# Patient Record
Sex: Female | Born: 1950 | Race: White | Hispanic: No | Smoking: Never smoker
Health system: Southern US, Community
[De-identification: ages and names within clinical notes are randomized; demographics above are authoritative.]

## PROBLEM LIST (undated history)

## (undated) DIAGNOSIS — F32A Depression, unspecified: Secondary | ICD-10-CM

## (undated) DIAGNOSIS — R011 Cardiac murmur, unspecified: Secondary | ICD-10-CM

## (undated) DIAGNOSIS — G47419 Narcolepsy without cataplexy: Secondary | ICD-10-CM

## (undated) DIAGNOSIS — F419 Anxiety disorder, unspecified: Secondary | ICD-10-CM

## (undated) DIAGNOSIS — E785 Hyperlipidemia, unspecified: Secondary | ICD-10-CM

## (undated) DIAGNOSIS — K219 Gastro-esophageal reflux disease without esophagitis: Secondary | ICD-10-CM

## (undated) DIAGNOSIS — R519 Headache, unspecified: Secondary | ICD-10-CM

## (undated) DIAGNOSIS — I10 Essential (primary) hypertension: Secondary | ICD-10-CM

## (undated) DIAGNOSIS — R112 Nausea with vomiting, unspecified: Secondary | ICD-10-CM

## (undated) DIAGNOSIS — F329 Major depressive disorder, single episode, unspecified: Secondary | ICD-10-CM

## (undated) DIAGNOSIS — G4711 Idiopathic hypersomnia with long sleep time: Secondary | ICD-10-CM

## (undated) DIAGNOSIS — R51 Headache: Secondary | ICD-10-CM

## (undated) DIAGNOSIS — J301 Allergic rhinitis due to pollen: Secondary | ICD-10-CM

## (undated) DIAGNOSIS — F319 Bipolar disorder, unspecified: Secondary | ICD-10-CM

## (undated) DIAGNOSIS — M797 Fibromyalgia: Secondary | ICD-10-CM

## (undated) DIAGNOSIS — R32 Unspecified urinary incontinence: Secondary | ICD-10-CM

## (undated) DIAGNOSIS — I2510 Atherosclerotic heart disease of native coronary artery without angina pectoris: Secondary | ICD-10-CM

## (undated) DIAGNOSIS — Z9889 Other specified postprocedural states: Secondary | ICD-10-CM

## (undated) DIAGNOSIS — M1990 Unspecified osteoarthritis, unspecified site: Secondary | ICD-10-CM

## (undated) DIAGNOSIS — I341 Nonrheumatic mitral (valve) prolapse: Secondary | ICD-10-CM

## (undated) HISTORY — PX: OTHER SURGICAL HISTORY: SHX169

## (undated) HISTORY — PX: REPLACEMENT TOTAL HIP W/  RESURFACING IMPLANTS: SUR1222

## (undated) HISTORY — DX: Allergic rhinitis due to pollen: J30.1

## (undated) HISTORY — DX: Atherosclerotic heart disease of native coronary artery without angina pectoris: I25.10

## (undated) HISTORY — DX: Essential (primary) hypertension: I10

## (undated) HISTORY — DX: Unspecified urinary incontinence: R32

## (undated) HISTORY — DX: Idiopathic hypersomnia with long sleep time: G47.11

## (undated) HISTORY — DX: Bipolar disorder, unspecified: F31.9

## (undated) HISTORY — DX: Hyperlipidemia, unspecified: E78.5

## (undated) HISTORY — PX: VESICOVAGINAL FISTULA CLOSURE W/ TAH: SUR271

---

## 1979-01-11 HISTORY — PX: OTHER SURGICAL HISTORY: SHX169

## 1989-01-10 HISTORY — PX: TOTAL ABDOMINAL HYSTERECTOMY: SHX209

## 1996-01-11 HISTORY — PX: ROTATOR CUFF REPAIR: SHX139

## 1997-09-08 ENCOUNTER — Emergency Department (HOSPITAL_COMMUNITY): Admission: EM | Admit: 1997-09-08 | Discharge: 1997-09-08 | Payer: Self-pay | Admitting: Emergency Medicine

## 1998-01-04 ENCOUNTER — Emergency Department (HOSPITAL_COMMUNITY): Admission: EM | Admit: 1998-01-04 | Discharge: 1998-01-04 | Payer: Self-pay | Admitting: Emergency Medicine

## 2001-01-16 ENCOUNTER — Encounter: Payer: Self-pay | Admitting: Orthopedic Surgery

## 2001-01-16 ENCOUNTER — Encounter: Admission: RE | Admit: 2001-01-16 | Discharge: 2001-01-16 | Payer: Self-pay | Admitting: Orthopedic Surgery

## 2001-04-12 ENCOUNTER — Encounter: Payer: Self-pay | Admitting: Orthopedic Surgery

## 2001-04-19 ENCOUNTER — Inpatient Hospital Stay (HOSPITAL_COMMUNITY): Admission: RE | Admit: 2001-04-19 | Discharge: 2001-04-22 | Payer: Self-pay | Admitting: Orthopedic Surgery

## 2001-07-09 ENCOUNTER — Encounter: Admission: RE | Admit: 2001-07-09 | Discharge: 2001-07-27 | Payer: Self-pay | Admitting: Orthopedic Surgery

## 2002-10-29 ENCOUNTER — Ambulatory Visit (HOSPITAL_BASED_OUTPATIENT_CLINIC_OR_DEPARTMENT_OTHER): Admission: RE | Admit: 2002-10-29 | Discharge: 2002-10-29 | Payer: Self-pay | Admitting: Family Medicine

## 2004-01-11 HISTORY — PX: SHOULDER ARTHROSCOPY: SHX128

## 2004-09-08 ENCOUNTER — Encounter: Admission: RE | Admit: 2004-09-08 | Discharge: 2004-09-08 | Payer: Self-pay | Admitting: Orthopedic Surgery

## 2004-12-06 ENCOUNTER — Other Ambulatory Visit: Admission: RE | Admit: 2004-12-06 | Discharge: 2004-12-06 | Payer: Self-pay | Admitting: Obstetrics & Gynecology

## 2005-01-10 DIAGNOSIS — I2510 Atherosclerotic heart disease of native coronary artery without angina pectoris: Secondary | ICD-10-CM

## 2005-01-10 HISTORY — DX: Atherosclerotic heart disease of native coronary artery without angina pectoris: I25.10

## 2005-01-29 ENCOUNTER — Encounter: Admission: RE | Admit: 2005-01-29 | Discharge: 2005-01-29 | Payer: Self-pay | Admitting: Cardiology

## 2005-03-17 ENCOUNTER — Ambulatory Visit (HOSPITAL_COMMUNITY): Admission: RE | Admit: 2005-03-17 | Discharge: 2005-03-18 | Payer: Self-pay | Admitting: Cardiology

## 2005-03-21 ENCOUNTER — Inpatient Hospital Stay (HOSPITAL_COMMUNITY)
Admission: RE | Admit: 2005-03-21 | Discharge: 2005-03-26 | Payer: Self-pay | Admitting: Thoracic Surgery (Cardiothoracic Vascular Surgery)

## 2005-03-21 HISTORY — PX: CORONARY ARTERY BYPASS GRAFT: SHX141

## 2005-03-23 ENCOUNTER — Encounter (INDEPENDENT_AMBULATORY_CARE_PROVIDER_SITE_OTHER): Payer: Self-pay | Admitting: Cardiology

## 2005-04-01 ENCOUNTER — Encounter: Admission: RE | Admit: 2005-04-01 | Discharge: 2005-04-01 | Payer: Self-pay | Admitting: Cardiology

## 2005-04-01 ENCOUNTER — Inpatient Hospital Stay (HOSPITAL_COMMUNITY): Admission: AD | Admit: 2005-04-01 | Discharge: 2005-04-03 | Payer: Self-pay | Admitting: Cardiology

## 2005-04-18 ENCOUNTER — Encounter: Admission: RE | Admit: 2005-04-18 | Discharge: 2005-04-18 | Payer: Self-pay | Admitting: *Deleted

## 2005-05-05 ENCOUNTER — Encounter (HOSPITAL_COMMUNITY): Admission: RE | Admit: 2005-05-05 | Discharge: 2005-06-10 | Payer: Self-pay | Admitting: Cardiology

## 2006-07-21 ENCOUNTER — Ambulatory Visit: Payer: Self-pay | Admitting: Pulmonary Disease

## 2006-08-11 ENCOUNTER — Ambulatory Visit: Payer: Self-pay | Admitting: Pulmonary Disease

## 2006-09-05 ENCOUNTER — Ambulatory Visit: Payer: Self-pay | Admitting: Pulmonary Disease

## 2006-11-02 ENCOUNTER — Ambulatory Visit: Payer: Self-pay | Admitting: Pulmonary Disease

## 2006-12-28 ENCOUNTER — Ambulatory Visit: Payer: Self-pay | Admitting: Pulmonary Disease

## 2006-12-28 DIAGNOSIS — F319 Bipolar disorder, unspecified: Secondary | ICD-10-CM

## 2006-12-28 DIAGNOSIS — E785 Hyperlipidemia, unspecified: Secondary | ICD-10-CM | POA: Insufficient documentation

## 2006-12-28 DIAGNOSIS — E7800 Pure hypercholesterolemia, unspecified: Secondary | ICD-10-CM

## 2006-12-28 DIAGNOSIS — I2510 Atherosclerotic heart disease of native coronary artery without angina pectoris: Secondary | ICD-10-CM | POA: Insufficient documentation

## 2007-03-02 ENCOUNTER — Telehealth (INDEPENDENT_AMBULATORY_CARE_PROVIDER_SITE_OTHER): Payer: Self-pay | Admitting: *Deleted

## 2007-03-07 ENCOUNTER — Ambulatory Visit: Payer: Self-pay | Admitting: Physical Medicine & Rehabilitation

## 2007-03-07 ENCOUNTER — Encounter
Admission: RE | Admit: 2007-03-07 | Discharge: 2007-03-08 | Payer: Self-pay | Admitting: Physical Medicine & Rehabilitation

## 2007-04-11 ENCOUNTER — Ambulatory Visit: Payer: Self-pay | Admitting: Pulmonary Disease

## 2007-04-30 ENCOUNTER — Ambulatory Visit (HOSPITAL_BASED_OUTPATIENT_CLINIC_OR_DEPARTMENT_OTHER): Admission: RE | Admit: 2007-04-30 | Discharge: 2007-04-30 | Payer: Self-pay | Admitting: Pulmonary Disease

## 2007-04-30 ENCOUNTER — Encounter: Payer: Self-pay | Admitting: Pulmonary Disease

## 2007-05-01 ENCOUNTER — Encounter: Payer: Self-pay | Admitting: Pulmonary Disease

## 2007-05-21 ENCOUNTER — Telehealth (INDEPENDENT_AMBULATORY_CARE_PROVIDER_SITE_OTHER): Payer: Self-pay | Admitting: *Deleted

## 2007-06-20 ENCOUNTER — Ambulatory Visit: Payer: Self-pay | Admitting: Pulmonary Disease

## 2007-07-25 IMAGING — CR DG CHEST 2V
2 series · 2 of 2 positions shown · non-contrast
Comparison: none

CLINICAL DATA: Precardiac cath.  
 CHEST ? 2 VIEW:

[view not recorded (1 of 2)]
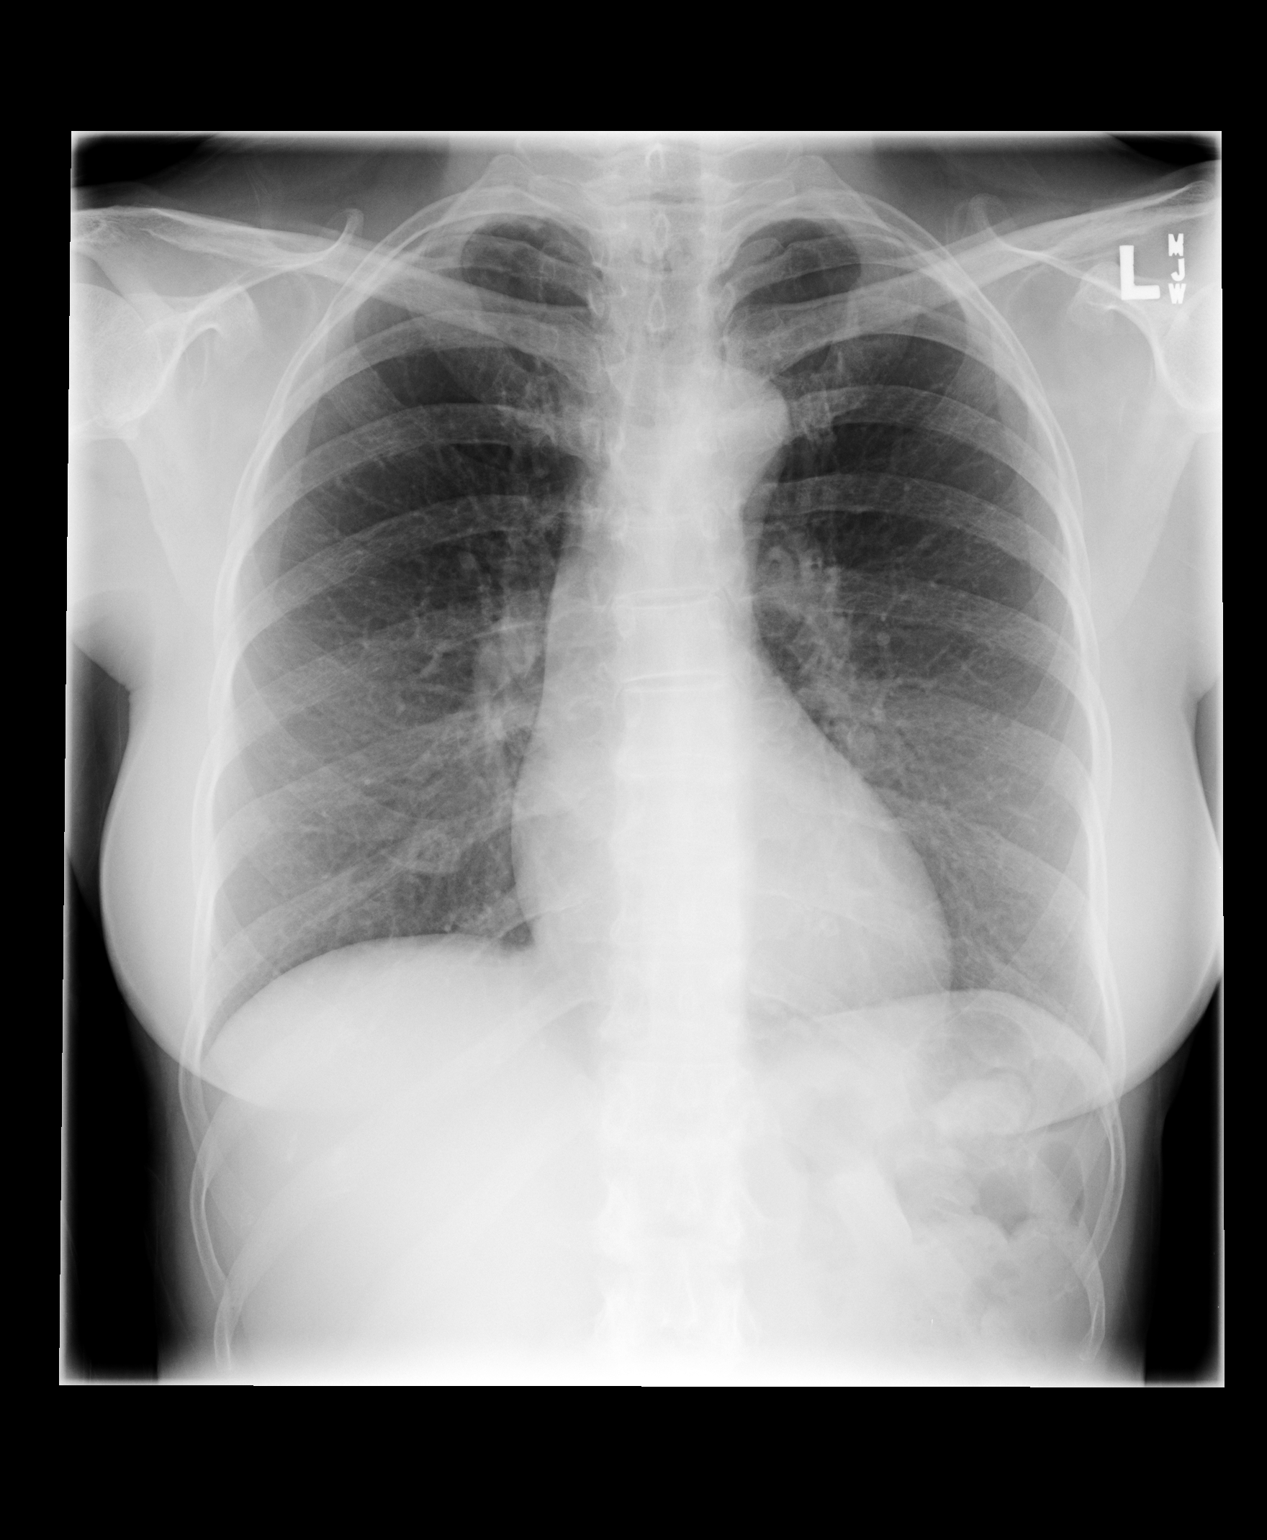

[view not recorded (2 of 2)]
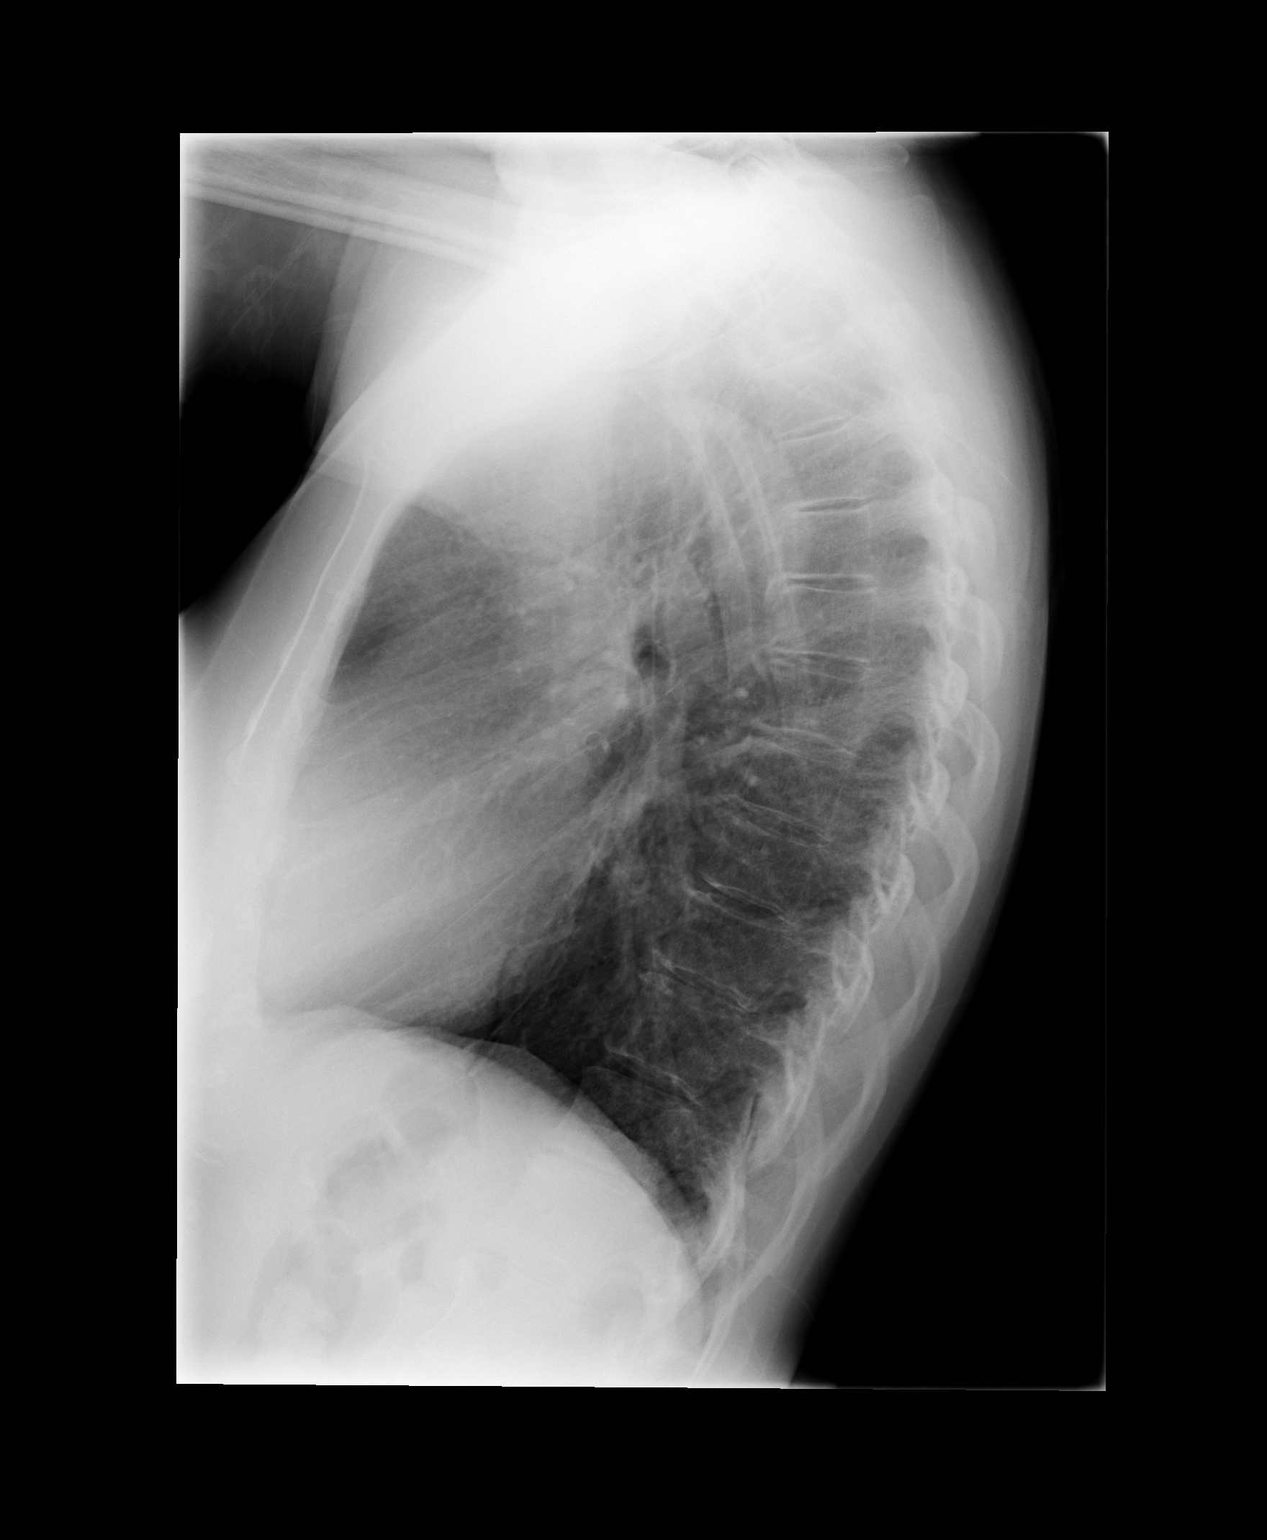

[2 of 2 positions shown; findings below may reference images not displayed]

FINDINGS: Two views of the chest show the lungs to be clear.  The heart is within upper limits of normal.  Mild peribronchial thickening is seen.  No bony abnormality is seen.
IMPRESSION: No active lung disease.  Mild peribronchial thickening.

## 2007-07-26 ENCOUNTER — Telehealth: Payer: Self-pay | Admitting: Pulmonary Disease

## 2007-10-02 ENCOUNTER — Telehealth (INDEPENDENT_AMBULATORY_CARE_PROVIDER_SITE_OTHER): Payer: Self-pay | Admitting: *Deleted

## 2007-10-31 ENCOUNTER — Telehealth (INDEPENDENT_AMBULATORY_CARE_PROVIDER_SITE_OTHER): Payer: Self-pay | Admitting: *Deleted

## 2007-12-19 ENCOUNTER — Telehealth: Payer: Self-pay | Admitting: Pulmonary Disease

## 2008-01-09 ENCOUNTER — Ambulatory Visit: Payer: Self-pay | Admitting: Pulmonary Disease

## 2008-01-21 DIAGNOSIS — G4710 Hypersomnia, unspecified: Secondary | ICD-10-CM

## 2008-03-14 ENCOUNTER — Telehealth: Payer: Self-pay | Admitting: Pulmonary Disease

## 2008-04-07 ENCOUNTER — Telehealth: Payer: Self-pay | Admitting: Internal Medicine

## 2008-05-06 ENCOUNTER — Ambulatory Visit: Payer: Self-pay | Admitting: Pulmonary Disease

## 2008-05-26 ENCOUNTER — Ambulatory Visit: Payer: Self-pay | Admitting: Pulmonary Disease

## 2008-06-26 ENCOUNTER — Telehealth (INDEPENDENT_AMBULATORY_CARE_PROVIDER_SITE_OTHER): Payer: Self-pay | Admitting: *Deleted

## 2008-07-10 ENCOUNTER — Telehealth: Payer: Self-pay | Admitting: Pulmonary Disease

## 2008-07-22 ENCOUNTER — Telehealth: Payer: Self-pay | Admitting: Pulmonary Disease

## 2008-08-14 ENCOUNTER — Telehealth (INDEPENDENT_AMBULATORY_CARE_PROVIDER_SITE_OTHER): Payer: Self-pay | Admitting: *Deleted

## 2008-09-19 ENCOUNTER — Telehealth (INDEPENDENT_AMBULATORY_CARE_PROVIDER_SITE_OTHER): Payer: Self-pay | Admitting: *Deleted

## 2008-10-28 ENCOUNTER — Telehealth: Payer: Self-pay | Admitting: Pulmonary Disease

## 2008-11-28 ENCOUNTER — Ambulatory Visit: Payer: Self-pay | Admitting: Pulmonary Disease

## 2008-12-31 ENCOUNTER — Telehealth: Payer: Self-pay | Admitting: Pulmonary Disease

## 2009-02-02 ENCOUNTER — Telehealth: Payer: Self-pay | Admitting: Pulmonary Disease

## 2009-02-11 ENCOUNTER — Telehealth (INDEPENDENT_AMBULATORY_CARE_PROVIDER_SITE_OTHER): Payer: Self-pay | Admitting: *Deleted

## 2009-02-23 ENCOUNTER — Encounter: Payer: Self-pay | Admitting: Pulmonary Disease

## 2009-03-02 ENCOUNTER — Telehealth: Payer: Self-pay | Admitting: Pulmonary Disease

## 2009-03-09 ENCOUNTER — Telehealth: Payer: Self-pay | Admitting: Internal Medicine

## 2009-04-21 ENCOUNTER — Telehealth (INDEPENDENT_AMBULATORY_CARE_PROVIDER_SITE_OTHER): Payer: Self-pay | Admitting: *Deleted

## 2009-04-28 ENCOUNTER — Encounter: Payer: Self-pay | Admitting: Pulmonary Disease

## 2009-05-25 ENCOUNTER — Telehealth (INDEPENDENT_AMBULATORY_CARE_PROVIDER_SITE_OTHER): Payer: Self-pay | Admitting: *Deleted

## 2009-07-02 ENCOUNTER — Telehealth (INDEPENDENT_AMBULATORY_CARE_PROVIDER_SITE_OTHER): Payer: Self-pay | Admitting: *Deleted

## 2009-07-08 ENCOUNTER — Telehealth (INDEPENDENT_AMBULATORY_CARE_PROVIDER_SITE_OTHER): Payer: Self-pay | Admitting: *Deleted

## 2009-07-09 ENCOUNTER — Encounter: Payer: Self-pay | Admitting: Pulmonary Disease

## 2009-08-21 ENCOUNTER — Telehealth (INDEPENDENT_AMBULATORY_CARE_PROVIDER_SITE_OTHER): Payer: Self-pay | Admitting: *Deleted

## 2009-10-01 ENCOUNTER — Ambulatory Visit: Payer: Self-pay | Admitting: Pulmonary Disease

## 2009-12-22 ENCOUNTER — Telehealth: Payer: Self-pay | Admitting: Pulmonary Disease

## 2009-12-28 ENCOUNTER — Telehealth (INDEPENDENT_AMBULATORY_CARE_PROVIDER_SITE_OTHER): Payer: Self-pay | Admitting: *Deleted

## 2009-12-28 ENCOUNTER — Ambulatory Visit: Payer: Self-pay | Admitting: Pulmonary Disease

## 2010-01-28 ENCOUNTER — Telehealth (INDEPENDENT_AMBULATORY_CARE_PROVIDER_SITE_OTHER): Payer: Self-pay | Admitting: *Deleted

## 2010-01-31 ENCOUNTER — Encounter: Payer: Self-pay | Admitting: Thoracic Surgery (Cardiothoracic Vascular Surgery)

## 2010-02-11 NOTE — Progress Notes (Signed)
Summary: Adderal Rx  Phone Note Call from Patient Call back at [phone]   Caller: Patient Call For: Jaidin Richison Summary of Call: need amphepamine salts  ER AND AMPHEPAMINE SALTS TAB PT WILL PICK UP Initial call taken by: Rickard Patience,  February 02, 2009 10:41 AM  Follow-up for Phone Call        called, spoke with pt.  Pt informed VS is out of office until tomorrow but will have him sign rxs tomorrow and will call her once they are signed.  She is ok with this.  rxs printed and given to Lawson Fiscal to have VS sign tomorrow.  Will forward message to VS as FYI. Follow-up by: Gweneth Dimitri RN,  February 02, 2009 10:47 AM  Additional Follow-up for Phone Call Additional follow up Details #1::        RX is in your look at folder awaiting your signature.Michel Bickers [facility]  February 02, 2009 1:22 PM    Additional Follow-up for Phone Call Additional follow up Details #2::    Signed script. Follow-up by: [name] MD,  February 03, 2009 1:37 PM  Prescriptions: ADDERALL 20 MG TABS (AMPHETAMINE-DEXTROAMPHETAMINE) one by mouth two times a day as needed  #60 x 0   Entered by:   Gweneth Dimitri RN   Authorized by:   [name] MD   Signed by:   Gweneth Dimitri RN on 02/02/2009   Method used:   Print then Give to Patient   RxID:   [id number] ADDERALL XR 20 MG XR24H-CAP (AMPHETAMINE-DEXTROAMPHETAMINE) one by mouth once daily  #30 x 0   Entered by:   Gweneth Dimitri RN   Authorized by:   [name] MD   Signed by:   Gweneth Dimitri RN on 02/02/2009   Method used:   Print then Give to Patient   RxID:   [id number]   Appended Document: Adderal Rx LMOM letting patient know her prescriptions were ready for pick up.

## 2010-02-11 NOTE — Progress Notes (Signed)
Summary: adderall PA-approved  Phone Note Outgoing Call   Call placed by: Vernie Murders,  July 08, 2009 4:37 PM Call placed to: Insurer Summary of Call: Called [employer] at [phone] to initiate adderall PA.  I answered the clinical questions over the phone and now will await a determination letter to be faxed to the triage faxline.  Initial call taken by: Vernie Murders,  July 08, 2009 4:43 PM  Follow-up for Phone Call        Recieved fax back from [employer] stating that med was approved starting 07/08/09-07/09/10. Sent this info to [employer] and will have approval letter scanned in on 07/09/09.   Follow-up by: Vernie Murders,  July 08, 2009 5:20 PM

## 2010-02-11 NOTE — Assessment & Plan Note (Signed)
Summary: ROV/LC   Visit Type:  Follow-up Copy to:  Dr. Jennelle Human, Dr. [name]  CC:  Hypersomnia...patient says she is doing well...no complaints today.  History of Present Illness: I saw Brandy [name] in follow up for her idiopathic hypersomnia.  She has been doing okay.  She goes to bed at 12am.  She falls asleep quickly, and is sleeping through the night.  She wakes up at 6am.  She feels okay in the morning after she takes her adderall.  She will take another dose at 1pm.  This helps her get through the day without too much trouble.  Her mood has been doing okay.  She had a bad spell of depression recently when she ran into an ex-boyfriend.  She didn't go to work for almost a week, but was eventually able to pull herself out of this.  She is not having any problems with headaches, tremors, chest pain, palpitations, shortness of breath, or abdominal symptoms.   Current Medications (verified): 1)  Diovan 160 Mg  Tabs (Valsartan) .... 1/2 By Mouth Daily 2)  Lamictal Xr 25 Mg Xr24h-Tab (Lamotrigine) .... 62.5mg  Daily 3)  Crestor 20 Mg  Tabs (Rosuvastatin Calcium) .... One Tablet Every Day 4)  Celebrex 50 Mg Caps (Celecoxib) .... Once Daily 5)  Adderall Xr 20 Mg Xr24h-Cap (Amphetamine-Dextroamphetamine) .Marland Kitchen.. 1-3 Tabs Daily As Needed 6)  Adderall 20 Mg Tabs (Amphetamine-Dextroamphetamine) .... One By Mouth Two Times A Day As Needed 7)  Nac 600 600 Mg Caps (Acetylcysteine) .... 2400mg  Daily 8)  Aleve 220 Mg Tabs (Naproxen Sodium) .Marland Kitchen.. 1-2 By Mouth Daily  Allergies (verified): 1)  ! Pcn 2)  ! Morphine 3)  ! Codeine 4)  ! Demerol 5)  ! Talwin 6)  ! * Bextra  Past History:  Past Medical History: Hyperlipidemia Idiopathic hypersomnia      - PSG 04/30/07 AHI 0.5, PLMI 5.2      - MSLT 04/30/07 mean sleep latency 4 min, 5/5 naps, 0/5 SOREM CAD, Dr. [name] HTN Bipolar, Dr. [name]  Past Surgical History: Reviewed history from 01/09/2008 and no changes  required. Tracheotomy 1956 Myomectomy 1981 Hysterectomy 1991 Open reduction nasal fracture 2003 Right rotator cuff repair 2003 Left shoulder arthroscopy 2003 CABG x one 2007  Vital Signs:  Patient profile:   60 year old female Height:      60 inches (152.40 cm) Weight:      108.50 pounds (49.32 kg) BMI:     21.27 O2 Sat:      99 % on Room air Temp:     98.0 degrees F (36.67 degrees C) oral Pulse rate:   89 / minute BP sitting:   118 / 76  (left arm) Cuff size:   regular  Vitals Entered By: Michel Bickers CMA (October 01, 2009 4:41 PM)  O2 Sat at Rest %:  99 O2 Flow:  Room air CC: Hypersomnia...patient says she is doing well...no complaints today Comments Medications reviewed with patient Daytime phone verified. Michel Bickers [facility]  October 01, 2009 4:42 PM   Physical Exam  General:  normal appearance, healthy appearing, and thin.   Nose:  no deformity, discharge, inflammation, or lesions Mouth:  no deformity or lesions Neck:  no masses, thyromegaly, or abnormal cervical nodes Lungs:  clear bilaterally to auscultation and percussion Heart:  regular rate and rhythm, S1, S2 without murmurs, rubs, gallops, or clicks Extremities:  no clubbing, cyanosis, edema, or deformity noted Neurologic:  normal CN II-XII and strength normal.  Cervical Nodes:  no significant adenopathy Psych:  anxious.     Impression & Recommendations:  Problem # 1:  HYPERSOMNIA, IDIOPATHIC (ICD-780.54) Will continue her current regimen of adderall.    Problem # 2:  BIPOLAR AFFECTIVE DISORDER (ICD-296.80) Her mood is stable on her current regimen.  Problem # 3:  CAD (ICD-414.00) She does not appear to have any adverse cardiovascular effects from adderall.  She is being followed by cardiology.  Medications Added to Medication List This Visit: 1)  Nac 600 600 Mg Caps (Acetylcysteine) .... 2400mg  daily 2)  Aleve 220 Mg Tabs (Naproxen sodium) .Marland Kitchen.. 1-2 by mouth daily  Complete Medication List: 1)   Diovan 160 Mg Tabs (Valsartan) .... 1/2 by mouth daily 2)  Lamictal Xr 25 Mg Xr24h-tab (Lamotrigine) .... 62.5mg  daily 3)  Crestor 20 Mg Tabs (Rosuvastatin calcium) .... One tablet every day 4)  Celebrex 50 Mg Caps (Celecoxib) .... Once daily 5)  Adderall Xr 20 Mg Xr24h-cap (Amphetamine-dextroamphetamine) .Marland Kitchen.. 1-3 tabs daily as needed 6)  Adderall 20 Mg Tabs (Amphetamine-dextroamphetamine) .... One by mouth two times a day as needed 7)  Nac 600 600 Mg Caps (Acetylcysteine) .... 2400mg  daily 8)  Aleve 220 Mg Tabs (Naproxen sodium) .Marland Kitchen.. 1-2 by mouth daily  Other Orders: Est. Patient Level III (16109)  Patient Instructions: 1)  Follow up in 6 months Prescriptions: ADDERALL 20 MG TABS (AMPHETAMINE-DEXTROAMPHETAMINE) one by mouth two times a day as needed  #60 x 0   Entered and Authorized by:   [name] MD   Signed by:   [name] MD on 10/01/2009   Method used:   Print then Give to Patient   RxID:   [id number] ADDERALL XR 20 MG XR24H-CAP (AMPHETAMINE-DEXTROAMPHETAMINE) 1-3 tabs daily as needed  #90 x 0   Entered and Authorized by:   [name] MD   Signed by:   [name] MD on 10/01/2009   Method used:   Print then Give to Patient   RxID:   [id number]

## 2010-02-11 NOTE — Progress Notes (Signed)
Summary: prescript  Phone Note Call from Patient Call back at [phone]   Caller: Patient Call For: sood Summary of Call: need prescript for dexteamtaphetamine cr. pt will pick up Initial call taken by: Rickard Patience,  July 02, 2009 9:46 AM  Follow-up for Phone Call        pls advise if ok to give  Philipp Deputy [facility]  July 02, 2009 11:17 AM   RX printed and signed by [name] and given to patient. Patient was here to pick up.Michel Bickers [facility]  July 03, 2009 5:33 PM    Prescriptions: ADDERALL 20 MG TABS (AMPHETAMINE-DEXTROAMPHETAMINE) one by mouth two times a day as needed  #29 x 0   Entered by:   Michel Bickers CMA   Authorized by:   [name] MD   Signed by:   Michel Bickers CMA on 07/03/2009   Method used:   Print then Give to Patient   RxID:   [id number] ADDERALL XR 20 MG XR24H-CAP (AMPHETAMINE-DEXTROAMPHETAMINE) 1-3 tabs daily as needed  #90 x 0   Entered by:   Michel Bickers CMA   Authorized by:   [name] MD   Signed by:   Michel Bickers CMA on 07/03/2009   Method used:   Print then Give to Patient   RxID:   [id number]

## 2010-02-11 NOTE — Progress Notes (Signed)
Summary: rx LMTCB x1   Phone Note Call from Patient Call back at Work Phone [phone]   Caller: Patient Call For: sood Reason for Call: Talk to Nurse Summary of Call: need rx for adderall xr - call pt she'll pick up. Initial call taken by: Eugene Gavia,  May 25, 2009 11:59 AM  Follow-up for Phone Call        rx printed and palced on CY look-at. Carron Curie CMA  May 25, 2009 12:20 PM   Additional Follow-up for Phone Call Additional follow up Details #1::        Rx signed by CY and placed at front desk for pt to pick up.  LMOMTCB to inform pt of this.  Gweneth Dimitri RN  May 25, 2009 1:36 PM     Additional Follow-up for Phone Call Additional follow up Details #2::    Spoke with pt and notified that rx is ready for pick up. Follow-up by: Vernie Murders,  May 25, 2009 2:41 PM  Prescriptions: ADDERALL XR 20 MG XR24H-CAP (AMPHETAMINE-DEXTROAMPHETAMINE) 1-3 tabs daily as needed  #90 x 0   Entered by:   Carron Curie CMA   Authorized by:   Waymon Budge MD   Signed by:   Carron Curie CMA on 05/25/2009   Method used:   Print then Give to Patient   RxID:   [id number]

## 2010-02-11 NOTE — Assessment & Plan Note (Signed)
Summary: OV - Trouble with sleep meds/ddp   Visit Type:  Follow-up Copy to:  Dr. Jennelle Human, Dr. [name]  CC:  Hypersomnia dollow-up...pt says adderall is not working as well for her.  History of Present Illness: I saw [name] in follow up for her idiopathic hypersomnia.  She has notice more trouble staying awake.  This is starting to affect her work.  She is worried she may need to ask for a medical leave from her work.  She feels like she has no energy or motivation.  She is exhausted all the time.  It is getting harder to get out of bed in the morning.  She has been drinking more coffee during the day.  She is not sure if adderall is working as well as before.  She was told the immediate release adderall is not available currently due to manufacturer backlog.  She has been taking 60 mg of adderall Xr per day.  However, she will take one pill upon waking up.  She takes another pill around noon, and then a third in the evening.  She says that she feels most alert in the evening, but then this interfers with her ability to fall asleep.  She was seen by Dr. Jennelle Human, and plan was to transition from lamictal to seroquel.  She took seroquel for two days and then stopped.  She felt like she was comatose and non-functional while using seroquel.  She feels like her mood can swing up and down several times per day.  Current Medications (verified): 1)  Diovan 160 Mg  Tabs (Valsartan) .... 1/2 By Mouth Daily 2)  Lamictal 150 Mg Tabs (Lamotrigine) .Marland Kitchen.. 1 By Mouth Daily 3)  Crestor 20 Mg  Tabs (Rosuvastatin Calcium) .... One Tablet Every Day 4)  Adderall Xr 20 Mg Xr24h-Cap (Amphetamine-Dextroamphetamine) .Marland Kitchen.. 1-3 Tabs Daily As Needed 5)  Adderall 20 Mg Tabs (Amphetamine-Dextroamphetamine) .... One By Mouth Two Times A Day As Needed 6)  Nac 600 600 Mg Caps (Acetylcysteine) .... 2400mg  Daily 7)  Aleve 220 Mg Tabs (Naproxen Sodium) .Marland Kitchen.. 1-2 By Mouth Daily  Allergies (verified): 1)  ! Pcn 2)  !  Morphine 3)  ! Codeine 4)  ! Demerol 5)  ! Talwin 6)  ! * Bextra  Past History:  Past Medical History: Reviewed history from 10/01/2009 and no changes required. Hyperlipidemia Idiopathic hypersomnia      - PSG 04/30/07 AHI 0.5, PLMI 5.2      - MSLT 04/30/07 mean sleep latency 4 min, 5/5 naps, 0/5 SOREM CAD, Dr. [name] HTN Bipolar, Dr. [name]  Past Surgical History: Reviewed history from 01/09/2008 and no changes required. Tracheotomy 1956 Myomectomy 1981 Hysterectomy 1991 Open reduction nasal fracture 2003 Right rotator cuff repair 2003 Left shoulder arthroscopy 2003 CABG x one 2007  Vital Signs:  Patient profile:   60 year old female Height:      60 inches (152.40 cm) Weight:      108.50 pounds (49.32 kg) BMI:     21.27 O2 Sat:      98 % on Room air Temp:     98.0 degrees F (36.67 degrees C) oral Pulse rate:   96 / minute BP sitting:   142 / 88  (left arm) Cuff size:   regular  Vitals Entered By: Michel Bickers CMA (December 28, 2009 4:48 PM)  O2 Sat at Rest %:  98 O2 Flow:  Room air CC: Hypersomnia dollow-up...pt says adderall is not working as well  for her Comments Medications reviewed with patient Michel Bickers [facility]  December 28, 2009 4:49 PM    Impression & Recommendations:  Problem # 1:  HYPERSOMNIA, IDIOPATHIC (ICD-780.54)  Will have her take all of her adderall xr in the morning to see if this improves her daytime wakefulness while avoiding trouble with her falling asleep.  She is to call in one week to determine if this has helped.  Problem # 2:  BIPOLAR AFFECTIVE DISORDER (ICD-296.80)  She was not able to tolerate seroquel due to oversedation.  She is to follow up with psychiatry for further assessment.  Medications Added to Medication List This Visit: 1)  Lamictal 150 Mg Tabs (Lamotrigine) .Marland Kitchen.. 1 by mouth daily 2)  Adderall Xr 20 Mg Xr24h-cap (Amphetamine-dextroamphetamine) .... 3 pills once daily in morning  Complete Medication  List: 1)  Diovan 160 Mg Tabs (Valsartan) .... 1/2 by mouth daily 2)  Lamictal 150 Mg Tabs (Lamotrigine) .Marland Kitchen.. 1 by mouth daily 3)  Crestor 20 Mg Tabs (Rosuvastatin calcium) .... One tablet every day 4)  Adderall Xr 20 Mg Xr24h-cap (Amphetamine-dextroamphetamine) .... 3 pills once daily in morning 5)  Nac 600 600 Mg Caps (Acetylcysteine) .... 2400mg  daily 6)  Aleve 220 Mg Tabs (Naproxen sodium) .Marland Kitchen.. 1-2 by mouth daily  Other Orders: Est. Patient Level III (84696)  Patient Instructions: 1)  Take three pills of adderall Xr in morning 2)  Call in one week to discuss status 3)  Follow up in 2 to 3 months   Immunization History:  Influenza Immunization History:    Influenza:  historical (10/10/2009)

## 2010-02-11 NOTE — Progress Notes (Signed)
Summary: prescript  Phone Note Call from Patient Call back at [phone]   Caller: Patient Call For: sood Summary of Call: need dextroamphetamine prescript she will pick up on monday Initial call taken by: Rickard Patience,  August 21, 2009 3:11 PM  Follow-up for Phone Call        Brandy [name]   is it ok if i print these off for SOOD to sign today??  please advise. thanks Randell Loop CMA  August 21, 2009 3:14 PM   [name], i have sch the patient for follow-up on 09/07/2009 with you. Okay to print RX?Michel Bickers [facility]  August 21, 2009 4:20 PM  Dr. Delton Coombes, pt is in our office and [name] is not in. Please advise on giving RX on pt's adderall.Michel Bickers [facility]  August 24, 2009 5:28 PM  1 month supply filled Leslye Peer MD  August 24, 2009 5:33 PM  Follow-up by: Leslye Peer MD,  August 24, 2009 5:33 PM    Prescriptions: ADDERALL 20 MG TABS (AMPHETAMINE-DEXTROAMPHETAMINE) one by mouth two times a day as needed  #30 x 0   Entered and Authorized by:   Leslye Peer MD   Signed by:   Leslye Peer MD on 08/24/2009   Method used:   Print then Give to Patient   RxID:   [id number] ADDERALL XR 20 MG XR24H-CAP (AMPHETAMINE-DEXTROAMPHETAMINE) 1-3 tabs daily as needed  #90 x 0   Entered and Authorized by:   Leslye Peer MD   Signed by:   Leslye Peer MD on 08/24/2009   Method used:   Print then Give to Patient   RxID:   [id number]

## 2010-02-11 NOTE — Progress Notes (Signed)
Summary: Adderall RX-LMTCB x 1  Phone Note Call from Patient Call back at [phone]   Caller: Patient Call For: sood Reason for Call: Refill Medication Summary of Call: Request written rx for adderall xr 20mg  will pick up. Initial call taken by: Darletta Moll,  January 28, 2010 9:01 AM  Follow-up for Phone Call        RX printed and placed in VS "look at" folder for him to sign. Please  give back to triage once RX is signed so we may contact patient.Michel Bickers [facility]  January 28, 2010 10:57 AM patient phoned returning [place] call she can be reached at her office phone [phone]. ..sign  Additional Follow-up for Phone Call Additional follow up Details #1::        Script signed. Additional Follow-up by: [name] MD,  January 28, 2010 1:46 PM    Additional Follow-up for Phone Call Additional follow up Details #2::    Rx in triage with evelope.  Per Mindy, we need to make sure pt is doing better since we changed her med.  I have called home number as well as other number given and LMOMTCB Vernie Murders  January 28, 2010 1:56 PM   called and spoke with pt.  pt states she is doing better on the Adderall XR 20mg  tabs and states the directions state to take 3 tabs daily but pt states she will take 2 tabs first thing in the morning and then 1 tab a little later during the day and states this helps keep her awake throughout the entire day.  pt aware script at front desk for her to pick up. Aundra Millet Reynolds LPN  January 28, 2010 5:09 PM   Prescriptions: ADDERALL XR 20 MG XR24H-CAP (AMPHETAMINE-DEXTROAMPHETAMINE) 3 pills once daily in morning  #90 x 0   Entered by:   Michel Bickers CMA   Authorized by:   [name] MD   Signed by:   Michel Bickers CMA on 01/28/2010   Method used:   Print then Give to Patient   RxID:   [id number]   Appended Document: Adderall RX-LMTCB x 1 That is fine.

## 2010-02-11 NOTE — Letter (Signed)
Summary: [facility]   Imported By: Sherian Rein 05/15/2009 10:07:49  _____________________________________________________________________  External Attachment:    Type:   Image     Comment:   External Document

## 2010-02-11 NOTE — Progress Notes (Signed)
Summary: written rx  Phone Note Call from Patient Call back at Home Phone [phone] Call back at Work Phone [phone]   Caller: Patient Call For: Jayton Popelka Reason for Call: Talk to Nurse Summary of Call: need rx for Adderall - last rx was only filled for 1/2 a month because pharmacy didn't have full rx.  Just need rx for #60( which is 2 per day) Pt will pick up rx. Initial call taken by: Eugene Gavia,  March 02, 2009 10:08 AM  Follow-up for Phone Call        The patient tried to fill the last RX and [facility] only [facility] 31 tabs and because of the classification of medication she will need a new RX for the remaining 29 tabs. I spoke with the pharmacy and they verified this. The patient is out of medication now. VS is not in the office today. Dr. Maple Hudson will you sign this RX for VS for # 29 tabs? Please advise. Follow-up by: Michel Bickers CMA,  March 02, 2009 10:44 AM  Additional Follow-up for Phone Call Additional follow up Details #1::        CD[facility] will sign for the additional 29 tabs. RX printed and placed on CDY cart. Please call patient when RX ready for pick up.Michel Bickers [facility]  March 02, 2009 12:11 PM Additional Follow-up by: Waymon Budge MD,  March 02, 2009 5:26 PM    Additional Follow-up for Phone Call Additional follow up Details #2::    Spoke with pt; aware rx at front desk for pick up.Reynaldo Minium CMA  March 02, 2009 5:27 PM   Prescriptions: ADDERALL 20 MG TABS (AMPHETAMINE-DEXTROAMPHETAMINE) one by mouth two times a day as needed  #29 x 0   Entered by:   Michel Bickers CMA   Authorized by:   Waymon Budge MD   Signed by:   Michel Bickers CMA on 03/02/2009   Method used:   Print then Give to Patient   RxID:   [id number]

## 2010-02-11 NOTE — Progress Notes (Signed)
Summary: P.A. needed for Adderall---[facility] # is busy  Phone Note Call from Patient Call back at [facility] Phone [phone]   Caller: Patient Call For: sood Summary of Call: Has an issue with pharmacy, re: refill on adderal.  Follow-up for Phone Call        The patient says prior auth needs to be done on the Adderall RX's. [facility] [phone]. ATC x2 and the # is busy. Michel Bickers CMA  February 11, 2009 3:29 PM  Awaiting fax form.Michel Bickers [facility]  February 11, 2009 4:09 PM  [name] this is in your look at. The patient is out of medication.Michel Bickers [facility]  February 11, 2009 4:10 PM  Additional Follow-up for Phone Call Additional follow up Details #1::        Form completed. Additional Follow-up by: [name] MD,  February 11, 2009 5:59 PM    Additional Follow-up for Phone Call Additional follow up Details #2::    Prior Auth form faxed.  Pt informed. Abigail Miyamoto RN  February 12, 2009 9:04 AM

## 2010-02-11 NOTE — Progress Notes (Signed)
Summary: prescription  Phone Note Call from Patient Call back at Work Phone [phone]   Caller: Patient Call For: sood Summary of Call: Needs written rx for adderall xr 20mg  wants to pu today. Initial call taken by: Darletta Moll,  April 21, 2009 3:35 PM  Follow-up for Phone Call        Please sign rx and return to triage thanks Vernie Murders  April 21, 2009 3:37 PM     Prescriptions: ADDERALL XR 20 MG XR24H-CAP (AMPHETAMINE-DEXTROAMPHETAMINE) 1-3 tabs daily as needed  #90 x 0   Entered by:   Vernie Murders   Authorized by:   [name] MD   Signed by:   Vernie Murders on 04/21/2009   Method used:   Print then Give to Patient   RxID:   [id number]

## 2010-02-11 NOTE — Medication Information (Signed)
Summary: Tax adviser   Imported By: Valinda Hoar 02/23/2009 11:22:02  _____________________________________________________________________  External Attachment:    Type:   Image     Comment:   External Document

## 2010-02-11 NOTE — Medication Information (Signed)
Summary: Tax adviser   Imported By: Valinda Hoar 07/09/2009 09:40:20  _____________________________________________________________________  External Attachment:    Type:   Image     Comment:   External Document

## 2010-02-11 NOTE — Progress Notes (Signed)
Summary: questions about adderall  Phone Note Call from Patient Call back at [phone]   Caller: Patient Call For: sood Summary of Call: has questions about her adderall prescription. Initial call taken by: Valinda Hoar,  March 09, 2009 12:41 PM  Follow-up for Phone Call        [facility].Carron Curie CMA  March 09, 2009 1:04 PM   Additional Follow-up for Phone Call Additional follow up Details #1::        Pt is having trouble getting plain Adderal tabs form pharmacy.  They keep saying they are out of stock. Pt has been taking Adderal XR 2-3 tabs a day while she has been out of the other and this seems to be working.  Pt wants to know is she can just take the Adderal XR 2-3 tabs a day and not even worry with the plain Adderall.  If so she will need a new rx for Adderall XR with a higher quantity.  She is currently out of both rx's.  Please advise.  Dr Maple Hudson could you look at this because Dr Craige Cotta is out of office until 03-30-09?  Thanks  Abigail Miyamoto RN  March 09, 2009 1:22 PM     Additional Follow-up for Phone Call Additional follow up Details #2::    per CY ok to give rx for 90 Adderal xr sig 1-3 daily as needed no refills. Carron Curie CMA  March 09, 2009 5:20 Pm pt advised, rx printed for CY to sign. Pt aware to pick up tomorrow. Carron Curie CMA  March 09, 2009 5:23 PM   New/Updated Medications: ADDERALL XR 20 MG XR24H-CAP (AMPHETAMINE-DEXTROAMPHETAMINE) 1-3 tabs daily as needed Prescriptions: ADDERALL XR 20 MG XR24H-CAP (AMPHETAMINE-DEXTROAMPHETAMINE) 1-3 tabs daily as needed  #90 x 0   Entered by:   Carron Curie CMA   Authorized by:   Waymon Budge MD   Signed by:   Carron Curie CMA on 03/09/2009   Method used:   Print then Give to Patient   RxID:   [id number]

## 2010-02-11 NOTE — Progress Notes (Signed)
Summary: appointment  Phone Note Call from Patient Call back at Home Phone [phone]   Caller: Patient Call For: [name] Summary of Call: Patient phoned and would like an appointment with [name] his first available isn't until 1/12 and she would like to be seen sooner then that. The medication that she is taking is not available in generic and she can't stay awake. Please call patient with an appointment. Patient can be reached at [phone]. Initial call taken by: Vedia Coffer,  December 28, 2009 10:11 AM  Follow-up for Phone Call        ATC x2 . Line busy. Abigail Miyamoto RN  December 28, 2009 12:07 PM   Pt given appr with Dr Craige Cotta today at 4:00. Abigail Miyamoto RN  December 28, 2009 12:22 PM

## 2010-02-11 NOTE — Progress Notes (Signed)
Summary: refill  Phone Note Call from Patient Call back at [phone]   Caller: Patient Call For: sood Reason for Call: Refill Medication Summary of Call: Request refills on adderall xr 20mg  and adderall 20mg , wants to p/u tomorrow. Initial call taken by: Darletta Moll,  December 22, 2009 12:24 PM  Follow-up for Phone Call        Called to verify medication refill request. Pt states she is calling to refill the above and will be able to pick up same tomorrow after 4pm. Please advise. Thanks. Zackery Barefoot CMA  December 22, 2009 1:36 PM   Additional Follow-up for Phone Call Additional follow up Details #1::        Okay to refill script for 3 month supply. Additional Follow-up by: [name] MD,  December 23, 2009 3:50 PM    Additional Follow-up for Phone Call Additional follow up Details #2::    pt aware rx ready for pick-up.Carron Curie CMA  December 23, 2009 4:19 PM   Prescriptions: ADDERALL 20 MG TABS (AMPHETAMINE-DEXTROAMPHETAMINE) one by mouth two times a day as needed  #60 x 0   Entered by:   Carron Curie CMA   Authorized by:   [name] MD   Signed by:   Carron Curie CMA on 12/23/2009   Method used:   Print then Give to Patient   RxID:   [id number] ADDERALL XR 20 MG XR24H-CAP (AMPHETAMINE-DEXTROAMPHETAMINE) 1-3 tabs daily as needed  #90 x 0   Entered by:   Carron Curie CMA   Authorized by:   [name] MD   Signed by:   Carron Curie CMA on 12/23/2009   Method used:   Print then Give to Patient   RxID:   [id number]

## 2010-02-25 ENCOUNTER — Emergency Department (HOSPITAL_BASED_OUTPATIENT_CLINIC_OR_DEPARTMENT_OTHER)
Admission: EM | Admit: 2010-02-25 | Discharge: 2010-02-25 | Disposition: A | Discharge status: Home / Self Care | Payer: BC Managed Care – PPO | Attending: Emergency Medicine | Admitting: Emergency Medicine

## 2010-02-25 DIAGNOSIS — F319 Bipolar disorder, unspecified: Secondary | ICD-10-CM | POA: Insufficient documentation

## 2010-02-25 DIAGNOSIS — Y92009 Unspecified place in unspecified non-institutional (private) residence as the place of occurrence of the external cause: Secondary | ICD-10-CM | POA: Insufficient documentation

## 2010-02-25 DIAGNOSIS — T426X1A Poisoning by other antiepileptic and sedative-hypnotic drugs, accidental (unintentional), initial encounter: Secondary | ICD-10-CM | POA: Insufficient documentation

## 2010-02-25 DIAGNOSIS — I10 Essential (primary) hypertension: Secondary | ICD-10-CM | POA: Insufficient documentation

## 2010-02-25 LAB — BASIC METABOLIC PANEL
BUN: 18 mg/dL (ref 6–23)
CO2: 25 mEq/L (ref 19–32)
Creatinine, Ser: 0.7 mg/dL (ref 0.4–1.2)
GFR calc Af Amer: >60 mL/min (ref >60)
GFR calc non Af Amer: >60 mL/min (ref >60)
Potassium: 4.1 mEq/L (ref 3.5–5.1)
Sodium: 144 mEq/L (ref 135–145)

## 2010-02-25 LAB — CBC
Platelets: 262 10*3/uL (ref 150–400)
RBC: 4.9 MIL/uL (ref 3.87–5.11)
RDW: 13.3 % (ref 11.5–15.5)

## 2010-02-25 LAB — DIFFERENTIAL: Lymphocytes Relative: 13 % (ref 12–46)

## 2010-03-02 ENCOUNTER — Encounter: Payer: Self-pay | Admitting: Pulmonary Disease

## 2010-03-02 ENCOUNTER — Telehealth (INDEPENDENT_AMBULATORY_CARE_PROVIDER_SITE_OTHER): Payer: Self-pay | Admitting: *Deleted

## 2010-03-09 NOTE — Progress Notes (Signed)
Summary: Adderall RX   Phone Note Outgoing Call   Call placed by: Michel Bickers CMA,  March 02, 2010 10:17 AM Call placed to: [facility] [phone] Summary of Call: Adderall PA initiated and APPROVED via phone.  Member ID# is [id number].  PT paid for #21 (7day supply) out of pocket on 01/28/2010 and will need a new RX per [facility]. Will print new RX for [name] to sign and will call the pt when ready for her to pick up. LMOM to inform patient and we will call her once RX is ready for pick up.  RX placed in VS look at for signature. Initial call taken by: Michel Bickers CMA,  March 02, 2010 10:20 AM  Follow-up for Phone Call        Will sign script when I arrive at office this afternoon. Follow-up by: [name] MD,  March 02, 2010 10:50 AM  Additional Follow-up for Phone Call Additional follow up Details #1::        called and informed pt rx was signed and was ready for pick up. I informed pt to stop at the front desk when she is ready to pick rx up and pt verbalized understanding Carver Fila  March 02, 2010 1:53 PM     Prescriptions: ADDERALL XR 20 MG XR24H-CAP (AMPHETAMINE-DEXTROAMPHETAMINE) 3 pills once daily in morning  #90 x 0   Entered by:   Michel Bickers CMA   Authorized by:   [name] MD   Signed by:   Michel Bickers CMA on 03/02/2010   Method used:   Print then Give to Patient   RxID:   [id number]

## 2010-03-18 NOTE — Medication Information (Signed)
Summary: Adderall XR/[facility]  Adderall XR/[facility]   Imported By: Lester [place] 03/08/2010 09:17:33  _____________________________________________________________________  External Attachment:    Type:   Image     Comment:   External Document

## 2010-04-01 ENCOUNTER — Telehealth: Payer: Self-pay | Admitting: Pulmonary Disease

## 2010-04-01 NOTE — Telephone Encounter (Signed)
lmomtcb x1 to see what other tablets pt is talking about. Pt also needs to make an f/u apt with [name]. Last seen 12/11 and was told to follow up in 2-3 months. Pt has no pending apts.

## 2010-04-02 NOTE — Telephone Encounter (Signed)
Spoke with pt.  Has pending appt with Dr Craige Cotta for 4/24 (first available) would like to have her adderall refilled.  Please advise if this is okay thanks

## 2010-04-02 NOTE — Telephone Encounter (Signed)
LMOMTCB

## 2010-04-03 NOTE — Telephone Encounter (Signed)
Ok to refill her prescription for one month only.  No refills.  She needs to keep scheduled appointment for 04/24.

## 2010-04-05 NOTE — Telephone Encounter (Signed)
LMOMTCB

## 2010-04-05 NOTE — Telephone Encounter (Signed)
PATIENT PHONED SHE STATED THAT SHE CALLED LAST WEEK FOR A WRITTEN RX FOR HER ADDERALL SHE MADE AN APPT FOR 4/24 BUT SHE IS ALMOST OUT AND WANTS TO KNOW IF DR SOOD WAS GOING TO WRITE A RX TO LAST UNTIL SHE IS SEEN SHE CAN BE REACHED AT [phone].Vedia Coffer

## 2010-04-06 MED ORDER — AMPHETAMINE-DEXTROAMPHET ER 20 MG PO CP24: ORAL_CAPSULE | ORAL | Status: DC

## 2010-04-06 NOTE — Telephone Encounter (Signed)
Pt returned call.  She was informed [name] ok with refill for 1 month but she will need to keep appt on 4/24 for additional refills.  She verbalized understanding of this.  Rx printed and given to Mindy to have [name] sign in the morning.  Pt requesting this be done ASAP in the morning as she only has 1 capsule left.  Will forward message to [name] so he is aware rx needs to be signed.

## 2010-04-07 NOTE — Telephone Encounter (Signed)
lmomtcb x1. To inform pt rx is signed and does pt want to pick this up or mailed to her home  [place], [place]

## 2010-04-08 NOTE — Telephone Encounter (Signed)
Called and lmom to inform pt her rx was ready for pick up here at the office  Carver Fila, CMA

## 2010-04-30 ENCOUNTER — Encounter: Payer: Self-pay | Admitting: Pulmonary Disease

## 2010-05-04 ENCOUNTER — Ambulatory Visit: Payer: BC Managed Care – PPO | Admitting: Pulmonary Disease

## 2010-05-05 ENCOUNTER — Ambulatory Visit: Payer: BC Managed Care – PPO | Admitting: Pulmonary Disease

## 2010-05-07 ENCOUNTER — Telehealth: Payer: Self-pay | Admitting: Pulmonary Disease

## 2010-05-07 MED ORDER — AMPHETAMINE-DEXTROAMPHET ER 20 MG PO CP24: ORAL_CAPSULE | ORAL | Status: DC

## 2010-05-07 NOTE — Telephone Encounter (Signed)
Called, spoke with pt.  She is requesting to pick up rx for adderall xr 20mg  today bc she only has 2 days left.  She is aware her last OV with [name] was on 12/28/09 and she was told to f/u in 2-3 months but not pending appts.  She had an appt on 05/05/10 but NOS.  Pt did schedule an appt for 5/15 (first available) with [name] and was advised she would have to keep appt for refills.  She verbalized understanding of this.  [name], pls advise if you are ok with giving pt rx to last until appt in May.  Thanks.

## 2010-05-07 NOTE — Telephone Encounter (Signed)
Okay to refill meds until next appointment?  

## 2010-05-07 NOTE — Telephone Encounter (Signed)
rx was printed off and signed by VS. Pt aware rx was ready and pt is coming to pick up rx. Pt aware she needs to keep f/u for further refills. Nothing further was needed

## 2010-05-21 ENCOUNTER — Encounter: Payer: Self-pay | Admitting: Pulmonary Disease

## 2010-05-25 ENCOUNTER — Ambulatory Visit: Payer: BC Managed Care – PPO | Admitting: Pulmonary Disease

## 2010-05-25 NOTE — Assessment & Plan Note (Signed)
[facility]                             PULMONARY OFFICE NOTE   NAME:BRINKLEYTieasha, Larsen                   MRN:          [id number]  DATE:10/09/2006                            DOB:          05-Nov-1950    I had received a phone call from Ms. Meriweather stating that she was on  her last dose of Adderall.  I have written her a prescription for  Adderall XR 20 mg 1 p.o. daily, and I have given her 30 tablets with 1  refill.  I have also written a prescription for Adderall 10 mg and she  is to use 1 to 2 tablets as needed, and I have given her 30 tablets with  1 refill.  I then plan on following up with her in the office in  approximately 1 month.     [name], MD  Electronically Signed    VS/MedQ  DD: 10/10/2006  DT: 10/10/2006  Job #: [id number]
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
## 2010-05-25 NOTE — Assessment & Plan Note (Signed)
[facility]                             PULMONARY OFFICE NOTE   NAME:[name], [name]                   MRN:          [id number]  DATE:10/10/2006                            DOB:          12/12/1950    I saw 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   She says that she has been having difficulty with her Adderall at the  current dose, as it does not seem like it is lasting long enough,  although she says that her sleep pattern has improved, and she is  actually falling asleep better at night.  She is also in the process of  having her bipolar and antidepressant medications adjusted with Dr.  Elijah Birk.  What I will do is change her to Adderall XR 20 mg daily, and  give her Adderall 10 mg a day.  She can take 1 to 2 tablets as needed if  she is having difficulty.  I will then follow up with her in  approximately 1 to 2 months to reassess her symptom status, and  determine if she will need to have any repeat sleep studies done.     [name], MD  Electronically Signed    VS/MedQ  DD: 10/10/2006  DT: 10/10/2006  Job #: [id number]
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
## 2010-05-25 NOTE — Assessment & Plan Note (Signed)
 HEALTHCARE                             PULMONARY OFFICE NOTE   EMNET, MONK                   MRN:          [id number]  DATE:08/11/2006                            DOB:          02-21-1950    REASON FOR VISIT:  I saw Ms. Squyres in followup today for her  hypersomnia.   Again, in summary, the patient had an overnight polysomnogram and a  multiple sleep latency test done in 2004 which showed an increase in her  periodic limb movement index as well as significant reduction in her  sleep latency to 1.6 minutes.  She had also been having problems with  sleep hygiene and we had reviewed various techniques with regards to  this as well as cognitive behavioral therapy for her sleep onset  insomnia.  Since her last visit she has started to be weaned off of her  Cymbalta and currently is taking 30 mg of this a day.  She has also  started on Abilify, initially at 10 mg a day.  She says that when she  was started on this she was feeling extremely sleepy and has now  decreased this to 2.5 mg a day.  She says this has helped to some  degree, but she is still feeling somewhat sleepy.  She has improved her  sleep habits and she is currently going to bed at 11 o'clock at night  and waking up at 7 in the morning.  She also will occasionally lay down  for 15 to 20 minutes in the afternoon, which she says has helped.  She  says that Buddha is okay, although she is still having some difficulty  focusing.   CURRENT MEDICATIONS:  1. Amphetamine 30 mg b.i.d.  2. Celebrex 1200 mg daily.  3. Doryx 100 mg daily.  4. Cymbalta 30 mg daily.  5. Diovan 80 mg daily.  6. Sanctura 60 mg daily.  7. Crestor daily.  8. Abilify 2.5 mg daily.   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  She has improved to some degree.  Part of this may be related  to change in her  antidepressant and bipolar medications and these are  further to be adjusted by Dr. Jennelle Human.  What I would plan on doing is  changing her over to Adderall 10 mg b.i.d. and I have given her a  prescription for #60 tablets with one refill and I will follow up with  her in approximately two months to decide if we should repeat her  overnight polysomnogram and her multiple sleep latency test.  In the  meantime, I have emphasized to her  the importance of continuing with her sleep hygiene and cognitive  behavioral therapy and techniques that we discussed at her initial  evaluation.     [name], MD  Electronically Signed    VS/MedQ  DD: 08/11/2006  DT: 08/12/2006  Job #: [id number]   cc:   Jetty Duhamel., M.D.  Cristy Hilts. Jacinto Halim, MD

## 2010-05-25 NOTE — Procedures (Signed)
Brandy [name], SHOAFF NO.:  [id number]   MEDICAL RECORD NO.:  [id number]          PATIENT TYPE:  OUT   LOCATION:  SLEEP CENTER                 FACILITY:  [facility]   PHYSICIAN:  [name], MD        DATE OF BIRTH:  July 09, 1950   DATE OF STUDY:  04/30/2007                            NOCTURNAL POLYSOMNOGRAM   REFERRING PHYSICIAN:  [name], MD   INDICATION FOR STUDY:  [name] is a 60 year old female who has a  history of hypersomnia, coronary artery disease, depression and bipolar  disorder.  She has persistent symptoms of excessive daytime sleepiness.  She is referred to the sleep lab for further evaluation of her  hypersomnia.  Please correlate findings of this study with MSLT to  follow on the following day.   Height is 5 feet, weight is 116 pounds and BMI is 23.  Neck size is 14  inches.   EPWORTH SLEEPINESS SCORE:  17   MEDICATIONS:  Adderall, Cymbalta, Crestor, Diovan, doxycycline,  Lamictal.   SLEEP ARCHITECTURE:  Sleep period of time was 453 minutes.  Total sleep  time was 370 minutes.  Sleep efficiency was 78%.  Sleep latency is 21  minutes.  REM latency was 268 minutes.  The patient was observed in all  stages of sleep.  The patient slept in both the supine and non-supine  position.   RESPIRATORY DATA:  The average respiratory rate was 14.  The overall  apnea-hypopnea index was 0.5.  There was 1 mixed respiratory event; the  remainder of the events were obstructive in nature.  The REM apnea-  hypopnea index was 3.3; the non-REM apnea-hypopnea index was 0.  The  supine apnea-hypopnea index was 0.4; the non-supine apnea-hypopnea index  was 0.4.  Occasional snoring was noted by the technician.   OXYGEN DATA:  The baseline oxygenation was 97%.  The oxygen saturation  nadir was 93%.   CARDIAC DATA:  The average heart rate was 73 and the rhythm strip showed  normal sinus rhythm.   MOVEMENT/PARASOMNIA:  The periodic limb movement index was 5.2  and the  patient had 1 restroom trip.   IMPRESSIONS/RECOMMENDATIONS:  This study does not show evidence for  sleep-disordered breathing, as her overall apnea-hypopnea index was 0.5.  Please note that she had 370 minutes of total sleep time during this  diagnostic study.  Please correlate findings with multiple sleep latency  tests to be conducted on April 30, 2007.      [name], MD  Diplomat, American Board of Sleep Medicine  Electronically Signed     VS/MEDQ  D:  06/10/2007 09:04:15  T:  06/10/2007 09:49:31  Job:  [id number]

## 2010-05-25 NOTE — Letter (Signed)
November 02, 2006    Theadora Rama, MD  [facility] [employer].  [address]  [place], [place] [address]   RE:  [name], PICKING  MRN:  [id number]  /  DOB:  March 16, 1950   Dear Dr. Cleda Clarks:   Brandy [name], date of birth 08-26-1950, is under my care at  [facility] for her sleep disorder. To assist in the treatment of  her sleep disorder, I have recommended that she have a scheduled nap for  10 to 15 minutes at least once or twice a day. If you have any questions  regarding this, you can contact me at [phone].    Sincerely,      [name], MD  Electronically Signed    VS/MedQ  DD: 11/02/2006  DT: 11/03/2006  Job #: [id number]

## 2010-05-25 NOTE — Assessment & Plan Note (Signed)
[facility]                             PULMONARY OFFICE NOTE   NAME:BRINKLEYNyimah, Brandy [name]                   MRN:          [id number]  DATE:11/02/2006                            DOB:          1950-11-13    I saw [name] in follow up today for her hypersomnia with possible  narcolepsy for sleep hygiene and sleep onset insomnia. She remains on  Adderall XR at 20 mg daily, as well as Adderall immediate release which  she is taking 10 to 20 mg daily. She goes to bed at about 11pm. She has  no trouble falling asleep. She will wake up once during the night to use  the bathroom and then gets up in the morning at 6:30, at which time she  takes her Adderall XR. She says that this will last her through until  about noon, at which time she takes 10 to 20 mg of Adderall. She says  that this will last for the next few hours, but then towards the evening  time, she finds that she starts to get sleepy again and has been  drinking several cups of coffee to help her stay awake. She is not  having any problems as far as jitters, tremors, chest pains, or  palpitations.   CURRENT MEDICATIONS:  1. Cymbalta 30 mg daily.  2. Diovan 80 mg daily.  3. Centra 60 mg daily.  4. Crestor.  5. Abilify 2 mg daily.  6. Adderall XR 20 mg daily.  7. Adderall 10 to 20 mg daily as needed.   PHYSICAL EXAMINATION:  VITAL SIGNS:  Weight 118 pounds, temperature  98.2, blood pressure 130/86, heart rate 76, oxygen saturation 100% on  room air.  HEENT:  There is no sinus tenderness and no oral lesions.  HEART:  S1 and S2.  CHEST:  Clear to auscultation.  ABDOMEN:  Soft and nontender.  EXTREMITIES:  No edema.   IMPRESSION:  1. 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 She is to continue on      Adderall XR at 20 mg daily and I have written a prescription for 90      days with no refills for this. She is also to continue on her      immediate release Adderall, but I have advised her to  try taking 20      to 30 mg a day as needed, and I have again written a prescription      for a 90 day supply with no refills for this. I have also advised      her that she should try to take a 10 to 15 minute nap at least once      or twice a day to help relieve her sleep pressure.  2. Depression and bipolar disorder. She is due to follow up with Dr.      Jennelle Human for this.  3. Coronary artery disease. She is due to follow up with Dr. Jacinto Halim for      this.   I will follow up with her in approximately three months.     [name], MD  Electronically Signed    VS/MedQ  DD: 11/02/2006  DT: 11/03/2006  Job #: [id number]

## 2010-05-28 NOTE — Procedures (Signed)
NAMEWANETTE, Brandy NO.:  [id number]   MEDICAL RECORD NO.:  [id number]          PATIENT TYPE:  OUT   LOCATION:  SLEEP CENTER                 FACILITY:  [facility]   PHYSICIAN:  [name], MD        DATE OF BIRTH:  09-23-1950   DATE OF STUDY:  05/01/2007                          MULTIPLE SLEEP LATENCY TEST   REFERRING PHYSICIAN:  [name], MD   REFERRING PHYSICIAN:  Dr. [name]   INDICATION FOR STUDY:  [name] is a 60 year old female who has a  history of hypersomnia, coronary artery disease, depression and bipolar  disorder.  She is having persistent symptoms of hypersomnia.  She is  referred to the sleep laboratory for a multiple sleep latency test to  further evaluate this.  Please correlate findings with overnight  polysomnogram done on April 30, 2007.   Height is 5 feet, weight is 117 pounds.   EPWORTH SLEEPINESS SCORE:  17   BMI:  23, neck size 14 inches   MEDICATIONS:  Adderall, Cymbalta, Crestor, Diovan, doxycycline and  Lamictal. The patient took Adderall at 6 a.m. on the morning of the  MSLT.   NAP 1:  Lights off at 8 a.m., lights on 8:17 a.m.  Sleep latency 2  minutes.  REM latency not applicable.   NAP 2:  Light off 10 a.m., lights on 10:17 a.m.  Sleep latency 2  minutes.  REM latency not applicable.   NAP 3:  Lights off 12 p.m., lights on 12:19 p.m.  Sleep latency 4  minutes.  REM latency not applicable.   NAP 4:  Lights off 2 p.m., lights on 2:19 p.m.  Sleep latency 4.5  minutes.  REM latency not applicable.   NAP 5:  Lights off 4 p.m., lights on 4:22 p.m.  Sleep latency 7.5  minutes.  REM latency not applicable.    MEAN SLEEP LATENCY:  4 minutes   NUMBER OF NAPS WITH SLEEP:  5 out of 5   NUMBER OF REM EPISODES:  0 out or 5   IMPRESSIONS:  This study shows evidence for significant hypersomnia as  her mean sleep latency was 4 minutes.      [name], MD  Diplomat, American Board of Sleep Medicine  Electronically  Signed     VS/MEDQ  D:  06/10/2007 09:09:07  T:  06/10/2007 10:41:29  Job:  [id number]

## 2010-05-28 NOTE — H&P (Signed)
[facility]  Patient:    Brandy [name], Brandy [name] Visit Number: [id number] MRN: [id number]          Service Type: Attending:  Magnus Ivan L. Patseavouras, M.D. Dictated by:   Hollice Espy. Patseavouras, M.D. Adm. Date:  04/19/01                           History and Physical  HISTORY OF PRESENT ILLNESS:  This 60 year old female was skiing in [place] when she fell at the end of a run.  She fell onto her face while wearing a ski mask, and this continued to hit into her nose.  She had immediate swelling, bruising, and bleeding from the nose.  She returned home and came for consultation.  She had noted that she had a marked asymmetrical nose, and the left side had been stopped up.  On examination the patient had an obvious nasal fracture which consisted of a deviation of the septum to the left side.  She also had an obvious depressed left nasal bone fracture.  This made for the right nasal bone and septum to sit above the left.  She had a palpable stepdown fracture along the left nasal bone.  The patient was counseled in regard to an open reduction of this nasal fracture.  This was done because it had been some time since her accident, and she had been seen in consultation.  Also, she is to have this performed at the same time as she is having rotator cuff surgery.  The procedure and all the risks and complications were explained to her in detail.  All questions were answered.  She states she read the literature given to her.  She is desirous of following through with this recommended procedure.  This will be performed at [facility].  On examination the patient has an obvious left nasal bone fracture with a shift of pyramid to the right.  This makes for right nasal bone itself to sit above the left.  There is a palpable stepdown fracture along the left nasal bone.  The septum is displaced to the left side.  IMPRESSION:  Nasal fracture. Dictated by:   Hollice Espy. Patseavouras, M.D. Attending:  Hollice Espy. Patseavouras, M.D. DD:  04/18/01 TD:  04/18/01 Job: [id number]

## 2010-05-28 NOTE — Consult Note (Signed)
NAMESHERMEKA, [name] NO.:  [id number]   MEDICAL RECORD NO.:  [id number]          PATIENT TYPE:  OIB   LOCATION:  3703                         FACILITY:  [facility]   PHYSICIAN:  Salvatore Decent. [name], M.D.DATE OF BIRTH:  11-Jun-1950   DATE OF CONSULTATION:  03/17/2005  DATE OF DISCHARGE:                                   CONSULTATION   REASON FOR CONSULTATION:  Severe proximal ostial LAD disease.   [name] OF PRESENT ILLNESS:  [name] is a 60 year old woman who has a  [name] of mild mitral valve prolapse, hypertension, and hyperlipidemia.  Brandy [name] also has a strong family [name] for peripheral vascular disease.  Brandy [name]  had been doing well with no complaints of chest pain or shortness of breath,  although Brandy [name] feels that Brandy [name] may be getting fatigued a little more easily  than normal.  Because of her family [name], Brandy [name] was referred for cardiology  evaluation.  Brandy [name] underwent a 2-D echocardiogram which showed normal LV  function.  There was some left ventricular hypertrophy.  There was mild  mitral valve prolapse and regurgitation.  Brandy [name] also had a stress Cardiolite  which showed an ejection fraction of 78%, but Brandy [name] did have anterior wall  ischemia.  Because of that, Brandy [name] was advised to undergo cardiac  catheterization which was performed today.  In the catheterization, Brandy [name] had  a 95% ostial LAD stenosis and also some thickening plaque buildup in the  left main as well as the proximal circumflex, but this was not  hemodynamically significant.   Brandy [name] is significant for hypertension and  hyperlipidemia which has been difficulty to control.  Brandy [name] has day time  lethargy, narcolepsy.  Brandy [name] does have some arthritis.  Brandy [name] has had multiple  rotator cuff surgeries on her right shoulder.   CURRENT MEDICATIONS:  1.  Ditropan 5 mg p.o. daily.  2.  Provigil 300 mg daily.  3.  Cymbalta 30 mg daily.  4.  Premarin 0.625 mg daily.  5.  Diovan 80 mg daily,  recently started.  6.  Crestor 20 mg daily which has just been started within the past couple      of weeks.  7.  Lamictal 5 mg daily.  8.  Aspirin 81 mg daily.  9.  Aleve p.r.n.   ALLERGIES:  Brandy [name] is allergic to PENICILLIN which causes an anaphylactic  reaction requiring tracheostomy.  Brandy [name] is also allergic to BEXTRA, TALWIN,  and is sensitive to NARCOTICS.   REVIEW OF SYSTEMS:  Brandy [name] sometimes has palpitations or rapid heart beat.  Brandy [name]  has [name] of irritable bowel syndrome. 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  Brandy [name] also has somnolence and depression.   SOCIAL [name]:  Brandy [name] is divorced.  Brandy [name] does not have children.  Brandy [name] is very  active.  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  Brandy [name] does not smoke or  drink.   FAMILY [name]:  Mother died of a brain hemorrhage at age 89.  Brandy [name] also had  an abdominal aortic aneurysm.  Father had lung  cancer at a very early age.   IMPRESSION AND PLAN:  Brandy [name] is a 60 year old woman with multiple  cardiac risk factors. Brandy [name] has not really had much in the way of symptoms,  but Brandy [name] has undergone appropriate evaluations including a stress Cardiolite  which was positive for anterior ischemia and subsequently had a  catheterization today which showed a 95% ostial left anterior descending  stenosis not amenable to percutaneous intervention.   In my opinion, I agree with Dr. Jacinto Halim that coronary artery bypass grafting  with a left mammary to the left anterior descending is our best option.  Given the need for single vessel and good target vessels, I think off-pump  grafting would be the best option in her case.  I have discussed with the  patient the indications, risks, benefits, and alternatives.  Brandy [name] understands  the nature of the operation including the incisions to be used.  Brandy [name]  understands the risks including but not limited to death, stroke, MI, DVT,  PE, bleeding, possible need for transfusion, as well as  infections and other  organ system dysfunction including respiratory, renal, GI complications.  Brandy [name] understands and accepts these risks and agrees to proceed.  Brandy [name] had  multiple intelligent questions which were answered to the best of my  ability.   My first available operating time is Monday afternoon, March 12.  If Brandy [name]  remains stable overnight, I think Brandy [name] might be able to go home on leave of  absence.  Brandy [name] is very anxious to return home to handle some affairs.  One  issue that will need to be addressed is her postoperative care after Brandy [name]  leaves the hospital as Brandy [name] lives by herself.  We will have the case manager  assess her options prior to her leaving the hospital.           ______________________________  Salvatore Decent. [name], M.D.     SCH/MEDQ  D:  03/17/2005  T:  03/18/2005  Job:  [id number]   cc:   Cristy Hilts. Jacinto Halim, MD  Fax: [phone]   Prudy Feeler, M.D.

## 2010-05-28 NOTE — Op Note (Signed)
NAMEJEANET, LUPE NO.:  [id number]   MEDICAL RECORD NO.:  [id number]          PATIENT TYPE:  OIB   LOCATION:  2899                         FACILITY:  [facility]   PHYSICIAN:  Cristy Hilts. Jacinto Halim, MD       DATE OF BIRTH:  1950/07/20   DATE OF PROCEDURE:  03/17/2005  DATE OF DISCHARGE:                                 OPERATIVE REPORT   PROCEDURE PERFORMED:  1.  Left ventriculography.  2.  Selective left coronary angiography.   INDICATIONS FOR PROCEDURE:  Ms. Laningham is a very active 60 year old  Caucasian female with a history of mitral valve prolapse, hypertension,  hyperlipidemia, who has a strong family history of premature peripheral  vascular disease and abdominal aortic aneurysm who underwent a Cardiolite  stress test and this revealed anterior wall ischemia.  Because of this, I  brought her back to the cardiac catheterization lab to evaluate her coronary  anatomy.   HEMODYNAMIC DATA:  Left ventricular pressures were 122/-2 with end diastolic  pressure of 2 mmHg.  The aortic pressure was 122/61 with a mean of 88 mmHg.  There was no pressure gradient across the aortic valve.   ANGIOGRAPHIC DATA:  Left ventricle:  The left ventricular systolic function was normal and  ejection fraction was estimated at 65%.  There was no significant mitral  regurgitation.   Right coronary artery:  The right coronary artery is a large caliber  dominant vessel.  Smooth and normal.   Left main:  The left main is very small moderate caliber vessel.  There is  no luminal obstruction.  It appears that it has mild plaque burden.  It  bifurcates into an LAD and circumflex.   Circumflex:  The circumflex is a moderate caliber vessel.  There is an  ostial 10-20% luminal narrowing but, otherwise, the circumflex vessel is  widely patent.  It continues in the AV groove after giving a large obtuse  marginal one.   Left anterior descending:  The left anterior descending  is a large  caliber  vessel.  It has an ostial high grade 95% stenosis followed by post stenotic  ectasia.  The LAD, otherwise, is smooth and gives origin to several small  diagonals.  The LAD wraps around the apex.   IMPRESSION:  1.  High grade ostial left anterior descending  stenosis.  Anatomy not      conducive for PCI.  2.  Plaque burden is noted in the left main and circumflex but no      significant luminal obstruction is noted.  There is diffuse mild luminal      narrowing.  3.  Right coronary artery is normal.  Early systolic function is normal.   RECOMMENDATIONS:  Given this highly complex anatomy of the ostial LAD  stenosis, she will need coronary artery bypass grafting.  I will discuss  these findings with Dr. [name] and this is to be done on a semi-urgent  basis.  The patient will not be returning to work until revascularization is  done.   TECHNIQUES OF PROCEDURE:  In the usual sterile technique, using  6 French  right femoral artery access, a 6 [place] multipurpose catheter was advanced  from the ascending aorta over a 0.035 inch J-wire.  The catheter was gently  advanced to the left ventricle.  Left ventricular pressure was monitored.  Hand contrast injection of the left ventricle was performed, both in LAO and  RAO projections.  The catheter was flushed with saline and pulled back into  the ascending aorta and pressure gradient across the aortic valve was  monitored.  The right coronary artery was selectively engaged and  angiography was performed.  During the procedure because of equipment  malfunctioning, the patient had to be moved to a different lab.  The  procedure was repeated.  At this time, I went back into the ascending aorta  with a 0.035 inch J-wire using a Judkins diagnostic catheter.  The left  coronary artery selectively engaged and angiography was performed.  I went  back again with a multi-purpose B2 catheter and right coronary artery was  selectively engaged  and angiography was performed and left ventriculography  was also performed in the RAO projection.  Then, the catheter was pulled out  of the body in the usual fashion.  The patient tolerated the procedure well.  No immediate complications were noted.  Approximately 120 mL of contrast was  used for diagnostic angiography.      Cristy Hilts. Jacinto Halim, MD  Electronically Signed     JRG/MEDQ  D:  03/17/2005  T:  03/17/2005  Job:  [id number]

## 2010-05-28 NOTE — Op Note (Signed)
[facility]  Patient:    Brandy [name], Brandy [name] Visit Number: [id number] MRN: [id number]          Service Type: SUR Location: 4W [id number] [id number] Attending Physician:  Marlowe Kays Page Dictated by:   Hollice Espy. Patseavouras, M.D. Proc. Date: 04/19/01 Admit Date:  04/19/2001 Discharge Date: 04/22/2001                             Operative Report  PREOPERATIVE DIAGNOSIS:  Nasal fracture.  POSTOPERATIVE DIAGNOSIS:  Nasal fracture.  OPERATION:  Open reduction of nasal fracture.  SURGEON:  Louie L. Patseavouras, M.D.  ANESTHESIA:  General endotracheal.  DESCRIPTION OF PROCEDURE:  The patient had been brought to the operating room where she had had bilateral shoulder procedure performed by Dr. Simonne Come. After completion of the bilateral shoulder procedure, open reduction of nasal fracture is to be carried out.  The head was then draped in the usual manner.  Subsequent anesthesia given to the nose by blocking and infiltrating the nose with 1% Xylocaine with adrenalin followed by application of 4% cocaine solution intranasally.  After adequate time for local as well, bilateral intercartilaginous incisions made and the bilateral cartilage exposed.  The skin was then further undermined over the dorsal nose.  A rim incision made and the lower lateral cartilage delivered.  The marked ballooning of the lateral cruciate corrected with the complete rim strip technique. A button-end knife was then inserted into the intercartilaginous incision and brought out the caudal nose to make a complete transfixed incision.  Bilateral mucoperichondrial and mucoperiosteal flaps were elevated.  This exposed a markedly deviated septum, reducing bulk and structure.  The cartilaginous deviation was then removed by means of sharp dissection.  The angulation of the bony septum was corrected by chisel and rongeurs.  This allowed the septum to swing free back to the midline with  good airway bilaterally.  Submucoperichondrial tunnels were then made beneath the junction of the upper lateral cartilage and the cartilaginous dorsum. They were separated with the angled scissors. A 4 mm chisel was then used to perform medial osteotomies.  The markedly asymmetrical pyramid which leaned to one side was then corrected by making a vestibular incision and performing lateral osteotomies with the Gardner chisel.  Infracture of the nasal bone was carried out and this realigned the nasal bones quite well.  With the septum and pyramid in good alignment, the transfixed incision was closed with a base suture of 3-0 plain catgut as well as interrupted suture of 5-0 plain catgut.  The rim incision was closed with interrupted suture of 5-0 plain catgut.  Intranasal splints were inserted and sutured in place with 3-0 Ethilon.  An external splint was applied.  The patient was then awakened, extubated, and returned to the recovery room for reactivity prior to being carried to her room.  She will be monitored in the hospital and is scheduled to return to my office in five days for followup. Dictated by:   Hollice Espy. Patseavouras, M.D. Attending Physician:  Joaquin Courts DD:  04/19/01 TD:  04/20/01 Job: [id number]

## 2010-05-28 NOTE — Discharge Summary (Signed)
Brandy [name], Brandy NO.:  [id number]   MEDICAL RECORD NO.:  [id number]          PATIENT TYPE:  INP   LOCATION:  2014                         FACILITY:  [facility]   PHYSICIAN:  Salvatore Decent. [name], M.D.DATE OF BIRTH:  Aug 09, 1950   DATE OF ADMISSION:  03/21/2005  DATE OF DISCHARGE:  03/26/2005                                 DISCHARGE SUMMARY   PRIMARY DIAGNOSIS:  Single vessel coronary artery disease.   IN-HOSPITAL DIAGNOSES:  1.  Postoperative pericarditis.  2.  Acute blood loss anemia postoperatively.  3.  Hypotension.  4.  Volume overload postoperatively.   SECONDARY DIAGNOSES:  1.  Hypertension.  2.  Hyperlipidemia.  3.  History of narcolepsy.  4.  History of arthritis.  5.  History of multiple repair cuff surgeries on her right shoulder.   ALLERGIES:  1.  PENICILLIN.  2.  BEXTRA.  3.  TALWIN.  4.  SENSITIVE TO NARCOTICS.   IN-HOSPITAL OPERATIONS/PROCEDURES:  Coronary artery bypass grafting off pump  x1 using a left internal mammary artery to the left anterior descending.   HISTORY, PHYSICAL, AND HOSPITAL COURSE:  The patient is 60 year old female,  who has a history of mild mitral valve prolapse with hypertension and  hyperlipidemia.  The patient has been complaining of fatigue, and due to  family history she was referred to Cardiology.  A two-D echocardiogram was  done, which showed normal LV function.  There was mild mitral valve prolapse  and regurgitation with left ventricular hypertrophy.  The patient then  underwent a Cardiolite stress test, which showed an ejection fraction of 78%  as well as anterior wall ischemia.  The patient then underwent cardiac  catheterization, which showed 95% ostial LAD stenosis and also some  thickening plaque buildup in her left main as well as the proximal  circumflex, but this was not hemodynamically significant.  Following this,  the patient was referred to Dr. [name].  The patient was seen and  evaluated by Dr. [name].  Dr. [name] discussed with patient  undergoing coronary artery bypass grafting x1.  He discussed the possibility  being able to do this procedure off pump.  Risks and benefits were discussed  with the patient.  She acknowledged her understanding and agreed to proceed.  Surgery was scheduled for March 21, 2005.   The patient was taken to the operating room on March 21, 2005, where she  underwent coronary artery bypass grafting off pump x1 using a left internal  mammary artery to the left anterior descending.  The patient tolerated this  procedure well and was transferred out to the intensive care unit in stable  condition.  The patient was extubated the evening of surgery.  Following the  extubation, she was seen to be alert and oriented x3 and neuro intact.  During patient's postoperative course, a postoperative EKG was done, which  showed diffuse increase in ST segments, which is suggestive of pericarditis.  The patient was started on Toradol at this point. A two-D echo was ordered  following this.  This showed no evidence of pericardial effusion.  There was  noted mitral prolapse.  This was monitored during the remainder of her  hospital course stay.  The patient was also seen to be hypotensive  postoperatively.  She received several boluses of IV fluids.  A beta blocker  was stopped at this time as well as Diovan.  The patient's blood pressure  did improve following the IV bolus.  By postop day #4, the patient's blood  pressure was stable at 127/80.  She was restarted on her Diovan at that  time.  Blood pressure will be monitored during the remainder of her hospital  stay.  Will currently hold beta blocker at this time.  The patient also  developed acute blood loss anemia postoperatively.  Her hemoglobin and  hematocrit dropped to 8.2 and 24.  The patient was asymptomatic from this,  which was monitored.  We will recheck hemoglobin and hematocrit prior  to DC  home.   Patient also developed volume overload postoperatively.  She was started on  diuretics.  Postop day #4, the patient's weight was seen to be 119 pounds,  her preoperative weight was 110.  She will be continued on Lasix for several  days.   The remainder of patient's hospital course was unremarkable.  Chest tubes  and lines were dc 'd in the normal fashion.  The patient was out of bed  ambulating well.  Incisions remained dry and intact and healing well.  She  remained afebrile during her postoperative course.  The patient was  tolerating a regular diet well, no nausea or vomiting noted.  Bowel  movements within normal limits prior to discharge home.  She was able to be  weaned off oxygen with saturation greater than 90% on room air.   The patient is tentatively ready for discharge home, postop day #5, March 26, 2005.  Followup appointment has been arranged with Dr. [name] for  April 18, 2005, at 2:15 p.m.  The patient is to obtain a PA and lateral chest  x-ray one hour prior to this appointment.  The patient is to contact Dr.  Verl Dicker office to schedule a followup appointment with him in two weeks.  Ms. Whitehurst received instructions on diet, activity level, and incisional  care.  She was told no driving till released to do so, no heavy lifting over  10 pounds.  The patient was told she is allowed to shower, washing her  incisions using soap and water.  She is to contact the office if she  develops any drainage or opening from any of her incision sites.  She is  told to ambulate three to four times per day, progress as tolerated, and to  continue her breathing exercises.  The patient is educated on diet to be low  fat, low salt.  She acknowledged her understanding.   DISCHARGE MEDICATIONS:  1.  Aspirin 325 mg daily.  2.  Crestor 20 mg at night.  3.  Cymbalta 70 mg at night.  4.  Premarin 0.625 mg at night. 5.  Ditropan as used at home.  6.  Diovan 80 mg daily.   7.  Lasix 40 mg daily x3 days.  8.  Potassium chloride 20 mEq daily x3 days.  9.  Oxycodone 5 mg 1-2 tabs q.4-6h p.r.n. pain.      Brandy Belfast, PA    ______________________________  Salvatore Decent [name], M.D.    KMD/MEDQ  D:  03/25/2005  T:  03/27/2005  Job:  [id number]   cc:   Cristy Hilts. Jacinto Halim, MD  Fax: [phone]

## 2010-05-28 NOTE — Op Note (Signed)
Brandy [name], UPLINGER NO.:  [id number]   MEDICAL RECORD NO.:  [id number]          PATIENT TYPE:  INP   LOCATION:  2303                         FACILITY:  [facility]   PHYSICIAN:  Salvatore Decent. [name], M.D.DATE OF BIRTH:  1950/05/15   DATE OF PROCEDURE:  03/21/2005  DATE OF DISCHARGE:                                 OPERATIVE REPORT   PREOPERATIVE DIAGNOSIS:  Single vessel coronary disease.   POSTOPERATIVE DIAGNOSIS:  Single vessel coronary artery disease.   PROCEDURE:  Median sternotomy, off-pump coronary bypass grafting times one  (left internal mammary artery to left anterior descending).   SURGEON:  Salvatore Decent. [name], M.D.   ASSISTANT:  Jerold Coombe, P.A.-C.   ANESTHESIA:  General.   FINDINGS:  Good quality mammary, good quality LAD.   CLINICAL NOTE:  Brandy [name] is a 60 year old female who underwent cardiology  evaluation due to her family history and age.  She had a stress Cardiolite  which showed evidence of anterior ischemia. She subsequently underwent a  cardiac catheterization which revealed a 95% ostial LAD lesion. She had no  other hemodynamically significant coronary stenoses. She was referred for  coronary artery bypass grafting.  The indications, benefits, and  alternatives were discussed in detail with the patient, she understood,  accepted risks of surgery, and agreed to proceed.   OPERATIVE NOTE:  Brandy [name] was brought the preop holding area on March 21, 2005, the anesthesia service placed lines for monitoring arterial,  central venous, and pulmonary arterial pressure. Intravenous antibiotics  were administered. She was taken to the operating room, anesthetized,  intubated. The chest, abdomen, and legs were prepped and draped in usual  fashion. A median sternotomy was performed. The left internal mammary artery  was harvested using standard technique.  It was a good quality vessel 1.5 mm  in diameter with excellent flow. The  patient was heparinized prior to  dividing the distal end of the left mammary artery.   The pericardium was opened. The patient was placed in Trendelenburg position  and rotated to the right side. Anticoagulation was managed ACT measurement  throughout the procedure.  A moist lap pad was placed behind the heart to  elevate the anterior wall the heart into the incision. Minimal sternal  retraction was used. The patient tolerated this well hemodynamically and the  Guidant off pump bypass system was used. The suction device was placed on  the apex of the heart and used to stabilize the heart. The site for grafting  was initially going to be proximal to the takeoff of the large diagonal  branch.  This portion of the vessel was superficially intramyocardial.  The  mammary was epicardial further distally and initially this area was  inspected and considered for bypass grafting.  However, as the vessel was  inspected more carefully at that site, it was very small and not suitable  for grafting.  The Silastic tapes that had been placed proximally and  distally then were removed and there was bleeding from the right ventricle  which was repaired with a 4-0 Prolene pledgeted suture. The LAD was  dissected out more proximally where it was of larger caliber and this turned  out to be a suitable site for grafting. The vessel was a 2 mm vessel at that  site. Silastic tapes were placed proximally and distally.  Again, the  Guidant device was used to stabilize the heart.  There was good  stabilization. The patient tolerated this well hemodynamically. An  arteriotomy was made and the Silastic tape was tightened proximally, the  distal tape did not need to be tightened. The distal end of the left mammary  artery was then beveled and anastomosed end-to-side to the LAD with a  running 8-0 Prolene suture. At the completion of the anastomosis, the  anastomosis probed easily in all three directions proximally  and distally in  the target vessel as well as retrograde in the mammary. The bulldog clamp  was released from the mammary artery, the anastomosis was de-aired, and the  suture was tied.  The anastomosis was carefully inspected for hemostasis and  the mammary pedicle was tacked to the epicardial surface of the heart with 6-  0 Prolene sutures. The heart then was allowed to rest in its normal location  removing all this port apparatus.  The patient did not show signs of  ischemia during the bypass but did have some ST elevation immediately  following the bypass consistent with reperfusion. There were no arrhythmias.  A ventricular bipolar pacing wire was placed on the inferior aspect the  heart. Atrial wires were placed on the right atrium. The pericardium was  reapproximated with interrupted 3-0 silk sutures. A left pleural and single  mediastinal chest tube was placed through separate subcostal incisions. The  sternum was closed with interrupted heavy gauge stainless steel wires. The  remainder of the incision was closed in standard fashion. Subcuticular  closure was used for the skin. All sponge, needle and sponge counts were  correct at the end of the procedure.  Of note, the patient did have a drop  in her cardiac index immediately following chest closure.  At this time, her  PA pressures were very low and her index responded to volume resuscitation.  She was transported from the operating room to the surgical intensive care  unit in critical but stable condition.           ______________________________  Salvatore Decent [name], M.D.     SCH/MEDQ  D:  03/21/2005  T:  03/22/2005  Job:  [id number]   cc:   Cristy Hilts. Jacinto Halim, MD  Fax: [phone]   Wilber Bihari, M.D.

## 2010-05-28 NOTE — H&P (Signed)
NAMETISH, BEGIN NO.:  [id number]   MEDICAL RECORD NO.:  [id number]           PATIENT TYPE:   LOCATION:                                 FACILITY:   PHYSICIAN:  Vonna Kotyk R. Jacinto Halim, MD            DATE OF BIRTH:   DATE OF ADMISSION:  DATE OF DISCHARGE:                                HISTORY & PHYSICAL   CARDIOTHORACIC SURGEON:  Viviann Spare C. [name], M.D.   ATTENDING PHYSICIAN:  Jeanella Cara, MD   REASON FOR HOSPITAL ADMISSION:  Pleural effusion.   HISTORY:  [name] is a 60 year old female with a history of  severe ostial LAD stenosis who underwent coronary artery bypass grafting on  03/21/05, by Dr. Charlett Lango and underwent off-pump LIMA to LAD bypass  surgery.  She had postoperatively developed a mild pericardial effusion but  was otherwise doing well, hence, was eventually discharged home on the  fourth postoperative day.  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   The patient states that, since her discharge, she has had increasing  shortness of breath, and she is unable to lie down, gets more short of  breath, and starts to cough.  This is a dry cough.  She has also been  running low-grade temperature.  She also has noticed a mild swelling in the  top portion of her chest at the surgical sternal incisional site.   REVIEW OF SYSTEMS:  She has no other bowel or bladder difficulties.  No  recent weight gain or weight loss.  No edema in the extremities.  Other  systems are negative.   PRESENT MEDICATIONS:  1.  Cymbalta 30 mg p.o. daily.  2.  Diovan 160 mg 1/2 tablet p.o. daily.  3.  Crestor 20 mg p.o. daily.  4.  Lamictal 5 mg p.o. daily.  5.  Aspirin 81 mg p.o. daily.  6.  Benadryl 25 mg 2 tablets p.o. at bedtime.  7.  Oxycodone 5 mg p.o. daily.  8.  Metoprolol ER 25 mg p.o. daily.   ALLERGIES:  PENICILLIN, BEXTRA, AND SHE IS INTOLERANT TO NARCOTICS.   PHYSICAL EXAMINATION:  She is a  moderately-built, petite person in no acute  distress.  Vital signs include a heart rate of 74 beats per minute and  regular, respirations 12, blood pressure 130/80 mmHg.  Cardiac exam: S1, S2  are normal.  There was no rub or murmur appreciated.  Chest exam: Decreased  breath sounds bilateral bases, left more than the right.  There is a small  hematoma noted just below the sternal notch.  Abdomen: Soft.  Bowel sounds  heard in all four quadrants.  Extremities: No edema.   PERTINENT FINDINGS:  A chest x-ray PA and lateral revealed a bilateral  basilar atelectasis and positive bilateral effusion.   A limited transthoracic echocardiogram done in my office today revealed a  large left pleural effusion associated with what appears like a septae,  probably suggesting hemorrhagic left pleural effusion.  There is also a  moderate-sized pleural  effusion and a small pericardial effusion.  There is  no evidence of tamponade by Doppler evaluation or by 2D evaluation.  This is  a preliminary report.   IMPRESSION:  1.  Large left pleural effusion and moderate right pleural effusion by      transthoracic echocardiogram and chest x-ray revealing bilateral basilar      atelectasis and/or effusion.  2.  Mild pericardial effusion.  3.  Coronary artery disease with high-grade ostial left anterior descending      stenosis, status post coronary artery bypass grafting done on 03/21/05,      by Dr. Charlett Lango.   RECOMMENDATIONS:  1.  The patient will be admitted to the hospital.  I will ask radiology to      do ultrasound-guided pleurocentesis due to the fact that she is      extremely symptomatic.  2. She will also need some sedatives for the      night and also a cough suppressant for the night, given significant      disturbance in her sleep pattern.  She may also need her right pleural      effusion tapped, but I will ask radiology's help during the ultrasound      guidance to see if they can  evaluate the same also.  Further management      will be after her pleurocentesis.  If she remains stable, she could      potentially be discharged tomorrow or the weekend.      Cristy Hilts. Jacinto Halim, MD  Electronically Signed     JRG/MEDQ  D:  04/01/2005  T:  04/01/2005  Job:  [id number]

## 2010-05-28 NOTE — Discharge Summary (Signed)
[facility]  Patient:    Brandy [name], Brandy [name] Visit Number: [id number] MRN: [id number]          Service Type: SUR Location: 4W [id number] [id number] Attending Physician:  Marlowe [name] Page Dictated by:   Brandy Bathe, P.A. Admit Date:  04/19/2001 Discharge Date: 04/22/2001   CC:         Brandy [name], M.D.   Discharge Summary  ADMITTING DIAGNOSES: 1. 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 2. Impingement, left shoulder. 3. Slap lesion, left shoulder. 4. Nasal fracture. 5. Chronic fibromyalgia.  DISCHARGE DIAGNOSES: 1. 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 2. Impingement, left shoulder. 3. Slap lesion, left shoulder. 4. Nasal fracture. 5. Chronic fibromyalgia. 6. Status post open reduction and internal fixation of nasal fracture. 7. Status post open rotator cuff repair on the [name]. 8. Left shoulder arthroscopy with shaving of the rotator cuff and labrum, and    subacromial decompression.  Surgeons for the second two was Brandy [name]    and Brandy Chimera, P.A.-C.  For the nasal fracture, it was    Brandy [name] with Brandy [name] for assistant.  HISTORY OF PRESENT ILLNESS:  Brandy [name] is a 60 year old female who has had bilateral shoulder pain along with a nasal fracture.  Workup has shown that she had a recurrent rotator cuff to the [name] shoulder along with severe impingement of the left shoulder and a slap lesion.  Due to multiple symptoms, she wished to take care of all this in one operative time and is felt appropriate.  The above operative procedures were scheduled with coordination of the surgeons and risks and benefits of all procedures were discussed with patient and she wished to proceed.  HOSPITAL COURSE:  Patient was admitted and underwent the above-named procedures and tolerated this well.  All appropriate IV antibiotics and analgesics were provided.  Postoperatively, she was placed in a sling with no range of motion  to the [name] shoulder; however, she was allowed general range of motion as tolerated to the left shoulder.  She also had an appropriate dressing placed to the nose.  Obviously, the patient had difficulty with pain control initially requiring a few days additional stay.  However, she did have significant nausea and vomiting, as well, on postoperative day #2.  She was kept on IV antibiotics and appropriate pain medicines were found for patient and, by day #3 after surgery, she was stable for discharge home.  She was neurovascularly intact of bilateral upper extremities.  The incisions were clean and dry.  All and all, she had done extremely well given multiple procedures and was stable for discharge to home.  LABORATORY DATA:  Admission labs within normal limits.  Chest film showed no acute process.  EKG not found at time of dictation.  CONDITION ON DISCHARGE:  Stable and improved.  DISCHARGE MEDICATIONS AND PLANS:  Patient being discharged home.  She is given Robaxin 500 mg one every 8 p.r.n. spasm, Ultram 50 mg one or two every 4 to 6 p.r.n. pain, and Dilaudid 2 mg one every 4 to 6 p.r.n. severe pain.  Resume her Celebrex at home.  Wear sling at all times in the [name] shoulder.  Left shoulder - she may do range of motion as tolerated.  Follow up in one week in our office and per Brandy [name] recommendations.  May shower five days postoperatively.  Resume home diet. Dictated by:   Brandy Bathe, P.A. Attending Physician:  Marlowe [name] Page  DD:  05/10/01 TD:  05/14/01 Job: [id number]

## 2010-05-28 NOTE — Discharge Summary (Signed)
[name], [name] NO.:  [id number]   MEDICAL RECORD NO.:  [id number]          PATIENT TYPE:  OIB   LOCATION:  3703                         FACILITY:  [facility]   PHYSICIAN:  Brandy Hilts. Jacinto Halim, Brandy [name]       DATE OF BIRTH:  [birth year]   DATE OF ADMISSION:  03/17/2005  DATE OF DISCHARGE:  03/18/2005                                 DISCHARGE SUMMARY   DISCHARGE DIAGNOSES:  1.  Coronary artery disease, status post catheterization during this      admission--await for coronary artery bypass graft.  2.  Hypertension.  3.  Mitral valve prolapse.  4.  Hyperlipidemia.  5.  Family history of peripheral vascular disease.   HOSPITAL PROCEDURES:  Cardiac catheterization performed on March 17, 2005, by  Dr. Jacinto [name]. The patient's coronary angiography revealed high-grade ostial LAD  stenosis and anatomy was not conducive for PCI. There was also plaque noted  in the left main and circumflex. No stenosis, just diffuse mild luminal  narrowing. RCA was normal with normal EF.   HOSPITAL CONSULTATIONS:  The patient has been seen by Dr. Tyrone Sage and the  procedure of coronary artery bypass grafting was discussed with the patient  and she agreed to proceed with the surgery.   HOSPITAL STUDIES:  The patient underwent peripheral vascular ultrasound of  the upper extremities which showed bilaterally both normal. The patient  underwent an right upper extremity ultrasound which showed on the right side  a Doppler obliterated with post radial and ulnar component and on the left  side Doppler obliterated with radial and normal ulnar component.   HISTORY OF PRESENT ILLNESS/HOSPITAL COURSE:  This is a 60 year old Caucasian  lady, a patient of Dr. Jacinto [name], who was seen in our office on February 28, 2005, and was scheduled for cardiac catheterization after a Cardiolite  stress test revealed abnormal result. Cardiolite was done to evaluate risks  for coronary artery disease in light of her family history  of peripheral  vascular disease and her personal history of hypertension and  hyperlipidemia.   The catheterization was performed on March 17, 2005,  revealing high-grade  ostial LAD stenosis and some involvement of the left main and circumflex  disease. The patient tolerated the cath without any problems. Because of the  anatomy of the plaque and distribution of it, the patient was not considered  to be a good candidate for percutaneous intervention and stenting. We asked  the CVT surgeon to see the patient. Dr. Tyrone Sage saw the patient and he  thinks that he would be a good candidate to proceed with coronary artery  bypass grafting which was scheduled on March 21, 2005.   The patient is being discharged home with a plan to return to [facility] for open heart surgery on March 21, 2005.   DISCHARGE MEDICATIONS:  1.  Aspirin 81 mg daily.  2.  Premarin 0.625 mg daily.  3.  Cymbalta 30 mg daily.  4.  Diovan 80 mg daily.  5.  Crestor 20 mg daily.  6.  Ditropan XL q.h.s.  7.  Lamictal 10  mg q.h.s.  8.  Nitroglycerin 0.4 mg sublingual p.r.n.   DISCHARGE DIET:  Low-fat, low-cholesterol diet.   DISCHARGE ACTIVITY:  No driving over the weekend and activity without  exertion.   All preparations have been done by CVTS for the patient's re-admission next  Monday.      Brandy [name], P.A.      Brandy Hilts. Jacinto Halim, Brandy [name]  Electronically Signed    MK/MEDQ  D:  03/18/2005  T:  03/19/2005  Job:  [id number]   cc:   [facility]

## 2010-05-28 NOTE — Op Note (Signed)
[facility]  Patient:    Brandy [name], Brandy [name] Visit Number: [id number] MRN: [id number]          Service Type: SUR Location: [id number] Attending Physician:  Marlowe Kays Page Dictated by:   Illene Labrador. Aplington, M.D. Proc. Date: 04/19/01 Admit Date:  04/19/2001                             Operative Report  PREOPERATIVE DIAGNOSES:  1. Recurrent rotator cuff tear, right shoulder.  2. Chronic impingement syndrome, left shoulder with partial rotator cuff     tear and minor labral disruption.  POSTOPERATIVE DIAGNOSES:  1. Recurrent rotator cuff tear, right shoulder.  2. Chronic impingement syndrome, left shoulder with partial rotator cuff     tear and minor labral disruption.  OPERATION PERFORMED:  1. Open anterior acromionectomy and repair of recurrent rotator cuff tear,     right shoulder.  2. a) Left shoulder arthroscopy with debridement of underneath surface of the     rotator cuff and minor labral disruption. b) Arthroscopic subacromial     decompression.  SURGEON:  Illene Labrador. Aplington, M.D.  ASSISTANTOttie Glazier. Wynona Neat, P.A.-C.  ANESTHESIA:  General.  PATHOLOGY AND JUSTIFICATION FOR PROCEDURE:  In 1991, I performed repair of a torn rotator cuff right shoulder. She has had recurrent pain. An arthrogram has demonstrated a complete tear, confirmed at surgery as discussed below. An MRI of the left shoulder has demonstrated partial rotator cuff tear along with a type 2 labral abnormality. This was confirmed at surgery and is discussed below as well.  DESCRIPTION OF PROCEDURE:  Prophylactic antibiotics, satisfactory general anesthesia, a Foley catheter was inserted. Because of the possible length of the procedure not only from my standpoint but Dr. Charolotte Eke who will dictated a separate operative note who is going to perform nasal surgery after I finish the two shoulder surgeries. She is placed in the beach chair position on the Schlein  frame with an IV in the left upper extremity to start with. The right shoulder girdle was prepped with Duraprep, draped in a sterile field, Ioban employed, vertical incision from roughly the [facility] joint down to the greater tuberosity. I opened the fascia on the anterior acromion in line with the skin incision. I undermined the anterior acromion and joint fluid came forth confirming the full thickness tear. I identified the [facility] joint with a Keith needle and marked out my initial line of anterior acromionectomy which I performed with a microsaw protecting the underneath rotator cuff with a Cobb elevator. She had a good bit of thickened bursa which I excised and I had to perform additional underneath surface acromial decompression to further relieve her impingement syndrome. The rotator cuff was inspected, she had a fairly significant area of abrasion over the anterior portion. The old Tycron sutures were identified and those that were quite prominent with a little reaction around them were removed. A 1 cm tear was identified at the greater tuberosity. After debriding this up and inspecting the underneath surface, I made a bony trough with 1/4 inch osteotome and rongeur and then placed two lateral drill holes through the greater tuberosity. I then placed one horizontal mattress suture using the two holes with a generous portion of rotator cuff engaged and then placed horizontal mattress suture both anteriorly and posteriorly using these two holes partly through hole and partly through rotator cuff. With the arm abducted, I then tied  down the rotator cuff securely into the bone trough and then also smoothed down but also further stabilized the lateral margin with interrupted #1 Vicryl. This gave a nice stable repair. I then checked to be sure that there was no residual impingement. The wound was irrigated with sterile saline and soft tissue was infiltrated with 0.5% Marcaine with adrenaline. I then  closed the deltoid muscle in two layers with interrupted #1 Vicryl and the fascia over the anterior acromion with one layer of interrupted #1 Vicryl with a nice solid solid repair. The subcutaneous tissue was closed with 2-0 Vicryl deep, 4-0 Vicryl superficially and Steri-Strips on the skin. A dry sterile dressing and shoulder immobilizer applied. We then used a second back table which we had kept sterile and the IV was moved to the right hand and the left shoulder girdle was prepped with Duraprep draped in a sterile field. Mr. Wynona Neat and I rescrubbed and gowned for this second procedure. I marked out the anatomy of the shoulder joint and infiltrated the lateral portal, posterior portal and the subacromial space with 0.5% Marcaine with adrenaline. Through the posterior portal, I used a blunt trocar atraumatically in the glenohumeral joint. The shoulder seemed to balloon up a little bit at that point raising some concern that she might have a full-thickness rotator cuff tear but I did not notice any additional swelling up so I proceeded with the arthroscopy. She had a good bit of fraying of the underneath surface of the rotator cuff which I pictured as well as what appeared to be minor labral disruption. The humeral head and the glenoid which have very minimal amount of wear were otherwise unremarkable. The biceps tendon was normal. I then used a switching stick and advanced the scope between the biceps tendon and subscapularis anteriorly and made a little pouch over the switching stick and introduced a metal cannula and a 4.2 shaver. I then shaved down the underneath surface of the rotator cuff but was unable to get at the labral area. Consequently, I had to reverse portals using a switching stick and from the posterior portal with the shaver and the anterior portal with the scope shaved down the labral area. There was no true detachment of the labrum. When I felt confident that we had  taken care of all the intra-articular pathology, the fluid was evacuated from the joint and I then redirected the scope in the subacromial area from the posterior  portal once again, anterolateral portal and introduced a 4.2 shaver. She had a good bit of bursal tissue which I resected. Once I had good visualization, I introduced the arthrocare vaporizer and took pictures noting the impingement and then began removing soft tissue from the underneath surface of the acromion back into the [facility] joint. I then brought in a 4-0 bur and began burring down the subacromial area and the underneath surface of the distal clavicle. I then alternated back and forth between the bur and the arthrocare vaporizer until I felt that all impingement structures had been relieved. I took documented pictures with the arm to the side and the arm abducted confirming this. All fluid was then evacuated from the subacromial space as well and the three portals were infiltrated once again with 0.5% Marcaine with adrenaline as was the subacromial space. The three portals were closed with 4-0 nylon, Betadine Adaptic dry sterile dressing and a sling were applied. At the time of this dictation, she was still under anesthesia and I was remaining in  the operating room until the presence of Dr. Charolotte Eke who had been notified previously. Dictated by:   Illene Labrador. Aplington, M.D. Attending Physician:  Joaquin Courts DD:  04/19/01 TD:  04/19/01 Job: [id number]

## 2010-06-14 ENCOUNTER — Encounter: Payer: Self-pay | Admitting: Pulmonary Disease

## 2010-06-14 ENCOUNTER — Ambulatory Visit (INDEPENDENT_AMBULATORY_CARE_PROVIDER_SITE_OTHER): Payer: BC Managed Care – PPO | Admitting: Pulmonary Disease

## 2010-06-14 VITALS — BP 122/80 | HR 82 | Temp 97.8°F | Ht 60.0 in | Wt 104.8 lb

## 2010-06-14 DIAGNOSIS — I2510 Atherosclerotic heart disease of native coronary artery without angina pectoris: Secondary | ICD-10-CM

## 2010-06-14 DIAGNOSIS — G4710 Hypersomnia, unspecified: Secondary | ICD-10-CM

## 2010-06-14 DIAGNOSIS — F319 Bipolar disorder, unspecified: Secondary | ICD-10-CM

## 2010-06-14 MED ORDER — AMPHETAMINE-DEXTROAMPHET ER 20 MG PO CP24: ORAL_CAPSULE | ORAL | Status: DC

## 2010-06-14 NOTE — Assessment & Plan Note (Signed)
She is followed by Dr. [name].

## 2010-06-14 NOTE — Progress Notes (Signed)
  Subjective:    Patient ID: Brandy [name], female    DOB: 1950/06/27, 60 y.o.   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  She feels her sleep and energy level have stabilized with adderal xr 40 mg in morning and 20 mg at lunch time.  She is not feeling the need to sleep at her desk in the afternoon anymore.    Her mood has been stable recently on lamictal.  She still gets the occasional ups and downs, but these don't happen as often or as extreme as before.  She denies palpitations, or tremors.  Past Medical History  Diagnosis Date  . Other and unspecified hyperlipidemia   . Hypersomnia   . CAD (coronary artery disease)   . Unspecified essential hypertension   . Bipolar 1 disorder      Review of Systems     Objective:   Physical Exam  BP 122/80  Pulse 82  Temp(Src) 97.8 F (36.6 C) (Oral)  Ht 5' (1.524 m)  Wt 104 lb 12.8 oz (47.537 kg)  BMI 20.47 kg/m2  SpO2 98%     General - thin HEENT - No sinus tenderness, no oral lesions Cardiac - s1s2 regular, no murmur, pulses symmetric Chest - no wheeze/rales Abd - soft, nontender Ext - no edema Neuro - normal strength  Assessment & Plan:   HYPERSOMNIA, IDIOPATHIC She is stable on her current regimen.  Explained the importance of maintaining regular follow up appointments.  BIPOLAR AFFECTIVE DISORDER She is followed by Dr. [name].  CAD She is followed by Dr. [name].    Updated Medication List Outpatient Encounter Prescriptions as of 06/14/2010  Medication Sig Dispense Refill  . amphetamine-dextroamphetamine (ADDERALL XR) 20 MG 24 hr capsule 3 by mouth daily (2 in the morning and 1 midday)  270 capsule  0  . lamoTRIgine (LAMICTAL) 150 MG tablet Take 150 mg by mouth daily.        . naproxen sodium (ANAPROX) 220 MG tablet Take 220 mg by mouth 2 (two) times daily with a meal.        . rosuvastatin (CRESTOR) 20 MG tablet Take 20 mg by mouth daily.         . valsartan (DIOVAN) 160 MG tablet Take 1/2 daily       . DISCONTD: amphetamine-dextroamphetamine (ADDERALL XR) 20 MG 24 hr capsule 3 by mouth daily (2 in the morning and 1 midday)       . DISCONTD: amphetamine-dextroamphetamine (ADDERALL XR) 20 MG 24 hr capsule 3 by mouth daily (2 in the morning and 1 midday)  90 capsule  0  . DISCONTD: Acetylcysteine (NAC 600) 600 MG CAPS Take by mouth. 2400mg  daily

## 2010-06-14 NOTE — Assessment & Plan Note (Signed)
She is followed by Dr. [name].

## 2010-06-14 NOTE — Patient Instructions (Signed)
Follow up in 6 months 

## 2010-06-14 NOTE — Assessment & Plan Note (Signed)
She is stable on her current regimen.  Explained the importance of maintaining regular follow up appointments.

## 2010-07-20 ENCOUNTER — Telehealth: Payer: Self-pay | Admitting: Pulmonary Disease

## 2010-07-20 DIAGNOSIS — G4710 Hypersomnia, unspecified: Secondary | ICD-10-CM

## 2010-07-20 MED ORDER — AMPHETAMINE-DEXTROAMPHET ER 20 MG PO CP24: ORAL_CAPSULE | ORAL | Status: DC

## 2010-07-20 NOTE — Telephone Encounter (Signed)
Mailed rx to patients home as requested.

## 2010-07-20 NOTE — Telephone Encounter (Deleted)
Pt states

## 2010-07-20 NOTE — Telephone Encounter (Signed)
lmomtcb x 1  Per our records VS wrote #270 with 0 refills on June 14, 2010 which should last her 3 months. Need to clarify with pt the need for new Rx.    Pt called back while finishing note. Pt calling pharmacy and will call back with the refill information.

## 2010-07-20 NOTE — Telephone Encounter (Signed)
Ok to make this month's Adderall script in my name

## 2010-07-20 NOTE — Telephone Encounter (Signed)
Pt aware CDY ok for signing rx this month and requesting this to be mailed to her home address which I verified.  Adderall 20mg  rx printed and placed on CDY's cart for signature.

## 2010-07-20 NOTE — Telephone Encounter (Signed)
Called and spoke to pharmacist who advised Adderall 20mg  XR #90 x 0 refills is the last prescription dated 06/14/2010 that was received. Pt is requesting new script to be mailed to her address. [name] is out of the office and I made pt aware of this, she advised Dr. Maple Hudson has signed for him in the past. Dr. Maple Hudson please advise if okay to sign in Dr. Evlyn Courier place. Thank you.

## 2010-08-25 ENCOUNTER — Telehealth: Payer: Self-pay | Admitting: Pulmonary Disease

## 2010-08-25 DIAGNOSIS — G4710 Hypersomnia, unspecified: Secondary | ICD-10-CM

## 2010-08-25 MED ORDER — AMPHETAMINE-DEXTROAMPHET ER 20 MG PO CP24: ORAL_CAPSULE | ORAL | Status: DC

## 2010-08-25 NOTE — Telephone Encounter (Signed)
RX has been printed and giving to VS to sign.

## 2010-08-25 NOTE — Telephone Encounter (Signed)
RX has been signed. LM on vm advising rx was ready for pick up and to call back if she has any questions

## 2010-10-07 ENCOUNTER — Telehealth: Payer: Self-pay | Admitting: Pulmonary Disease

## 2010-10-07 DIAGNOSIS — G4710 Hypersomnia, unspecified: Secondary | ICD-10-CM

## 2010-10-07 MED ORDER — AMPHETAMINE-DEXTROAMPHET ER 20 MG PO CP24: ORAL_CAPSULE | ORAL | Status: DC

## 2010-10-07 NOTE — Telephone Encounter (Signed)
Called and spoke with pt.  Pt requests rx for Adderall XR 20mg .  She was last given this on 08/25/10 for # 90 x 0 refills.  Pt states she is now eligible for a 90 day supply through her pharmacy and therefore requests rx for # 270.  Pt would like to pick this rx up this afternoon.  VS, please advise.  Thanks.

## 2010-10-07 NOTE — Telephone Encounter (Signed)
rx printed and given to Mindy to have VS sign this afternoon when he has office.

## 2010-10-07 NOTE — Telephone Encounter (Signed)
Please print script and I will sign this afternoon in office.

## 2010-10-07 NOTE — Telephone Encounter (Signed)
Pt is aware rx was ready for p/u

## 2011-01-25 ENCOUNTER — Telehealth: Payer: Self-pay | Admitting: Pulmonary Disease

## 2011-01-25 DIAGNOSIS — G4710 Hypersomnia, unspecified: Secondary | ICD-10-CM

## 2011-01-25 MED ORDER — AMPHETAMINE-DEXTROAMPHET ER 20 MG PO CP24: ORAL_CAPSULE | ORAL | Status: DC

## 2011-01-25 NOTE — Telephone Encounter (Signed)
I spoke with pt and is requesting her adderral RX for 90 day supply and wants to p/u on Thursday. Pt was last seen 06/14/10 told to f/u in 6 months and is scheduled to come in 03/08/11. I have printed off rx and will have VS sign when he returns to the office. Please advise [name], thanks

## 2011-01-26 NOTE — Telephone Encounter (Signed)
RX has been signed BY vs and placed upfront for pt to p/u. LMOMTCB X1 for pt to make aware.

## 2011-01-27 NOTE — Telephone Encounter (Signed)
LMOM informing pt rx signed and ready at front desk to pick up anytime m-f 8 - 5:30pm

## 2011-02-09 ENCOUNTER — Telehealth: Payer: Self-pay | Admitting: Pulmonary Disease

## 2011-02-09 NOTE — Telephone Encounter (Signed)
Member ID # [id number]. PA for Adderall has been APPROVED for 1 year starting 02/09/11 through 02/08/2012. Pt has been notified.

## 2011-03-08 ENCOUNTER — Encounter: Payer: Self-pay | Admitting: Pulmonary Disease

## 2011-03-08 ENCOUNTER — Ambulatory Visit (INDEPENDENT_AMBULATORY_CARE_PROVIDER_SITE_OTHER): Payer: Self-pay | Admitting: Pulmonary Disease

## 2011-03-08 DIAGNOSIS — G4710 Hypersomnia, unspecified: Secondary | ICD-10-CM

## 2011-03-08 DIAGNOSIS — F319 Bipolar disorder, unspecified: Secondary | ICD-10-CM

## 2011-03-08 NOTE — Progress Notes (Signed)
Chief Complaint  Patient presents with  . Follow-up    8 month follow up - reports sleep and energy levels are better since retiring Jan 30   CC: Brandy [name], Brandy [name]  History of Present Illness: Brandy [name] is a 61 y.o. 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  She retired from her work in January.  She has been feeling much better since.  She feels like she is free.  She was also advised that her financial situation is quite good for her retirement.    She is now able to sleep longer, and take naps in the afternoon when she feels like.  She continues to take 40 mg adderall in the morning, but is not needing to take additional 20 mg in the afternoon like she was while working.  Her sleep pattern is still erratic, but she thinks is mostly related to adjusting to her new schedule outside of work.  She feels she is a night owl.  She is getting about 8 to 10 hours sleep per day.  She thinks she should get up at 7 am to stay on a "normal" schedule, but couldn't really give a specific reason for this.  When she was last seen by Dr. Jennelle Human she was advised to start on lithium.  She was concerned about the side effects.  She also felt like she was doing much better after retiring.  As a result she did not start this medication.  Past Medical History  Diagnosis Date  . Hyperlipidemia   . Idiopathic hypersomnia   . CAD (coronary artery disease)   . Hypertension   . Bipolar 1 disorder     Past Surgical History  Procedure Date  . Trach   . Myomectomy   . Vesicovaginal fistula closure w/ tah   . Rotator cuff repair   . Coronary artery bypass graft   . Shoulder arthroscopy     Allergies  Allergen Reactions  . Codeine   . Meperidine Hcl   . Morphine   . Penicillins   . Pentazocine Lactate     Physical Exam:  Blood pressure 164/88, pulse 83, temperature 96.9 F (36.1 C), temperature source Oral, height 5' (1.524 m), weight 106 lb 3.2 oz (48.172 kg), SpO2 100.00%. Body mass  index is 20.74 kg/(m^2). Wt Readings from Last 2 Encounters:  03/08/11 106 lb 3.2 oz (48.172 kg)  06/14/10 104 lb 12.8 oz (47.537 kg)   Psychiatric - Pleasant, normal mood/behavior Remainder of exam deferred   Assessment/Plan:  Outpatient Encounter Prescriptions as of 03/08/2011  Medication Sig Dispense Refill  . amphetamine-dextroamphetamine (ADDERALL XR) 20 MG 24 hr capsule 3 by mouth daily (2 in the morning and 1 midday)  270 capsule  0  . lamoTRIgine (LAMICTAL) 150 MG tablet Take 150 mg by mouth daily.        Marland Kitchen lithium carbonate (ESKALITH) 450 MG CR tablet Take 1 tablet by mouth daily.      . naproxen sodium (ANAPROX) 220 MG tablet Take 220 mg by mouth 2 (two) times daily with a meal.        . rosuvastatin (CRESTOR) 20 MG tablet Take 20 mg by mouth daily.        . valsartan (DIOVAN) 160 MG tablet Take 160 mg by mouth daily.       . VESICARE 5 MG tablet Take 1 tablet by mouth daily.      Marland Kitchen VIIBRYD 40 MG TABS Take 0.5 tablets by mouth daily.  Priyansh Pry Pager:  [phone] 03/10/2011, 1:30 AM

## 2011-03-08 NOTE — Patient Instructions (Addendum)
Follow up in 3 months

## 2011-03-10 ENCOUNTER — Encounter: Payer: Self-pay | Admitting: Pulmonary Disease

## 2011-03-10 NOTE — Assessment & Plan Note (Addendum)
Advised her she could try to lower dose of adderall as tolerated.  Discussed importance of maintaining regular sleep/wake scheduled.  Explained that there is no reason she shouldn't be able to go to bed later and wake up later, as long as this does not interfere with her daily activities.  This is less of an issue since she retired.  Encouraged her to continue to take naps in the afternoon.  Will follow up in 3 months.  Hopefully she will have a more regular schedule then, and can discuss reducing her dose of adderall further.

## 2011-03-10 NOTE — Assessment & Plan Note (Signed)
She is followed by Dr. [name].  Advised her to d/w Dr. Jennelle Human about whether she should start lithium.

## 2011-06-16 ENCOUNTER — Telehealth: Payer: Self-pay | Admitting: Pulmonary Disease

## 2011-06-16 DIAGNOSIS — G4710 Hypersomnia, unspecified: Secondary | ICD-10-CM

## 2011-06-16 MED ORDER — AMPHETAMINE-DEXTROAMPHET ER 20 MG PO CP24: ORAL_CAPSULE | ORAL | Status: DC

## 2011-06-16 NOTE — Telephone Encounter (Signed)
Please return RX to triage after signing so we may contact the pt to let her know it is ready for pick up. Thanks.

## 2011-06-16 NOTE — Telephone Encounter (Signed)
Pt set an appt w/ VS for 06/17/11 at 10:15.  Pt would like to pick up the Rx.  Antionette Fairy

## 2011-06-16 NOTE — Telephone Encounter (Signed)
Pt needs to schedule a follow-up appt with VS. RX printed and placed in VS look at folder. Does pt want to pick the RX up or have this mailed to her home address?

## 2011-06-17 ENCOUNTER — Ambulatory Visit (INDEPENDENT_AMBULATORY_CARE_PROVIDER_SITE_OTHER): Payer: BC Managed Care – PPO | Admitting: Pulmonary Disease

## 2011-06-17 ENCOUNTER — Encounter: Payer: Self-pay | Admitting: Pulmonary Disease

## 2011-06-17 VITALS — BP 140/78 | HR 98 | Temp 98.3°F | Ht 60.0 in | Wt 109.8 lb

## 2011-06-17 DIAGNOSIS — F319 Bipolar disorder, unspecified: Secondary | ICD-10-CM

## 2011-06-17 DIAGNOSIS — G4710 Hypersomnia, unspecified: Secondary | ICD-10-CM

## 2011-06-17 NOTE — Assessment & Plan Note (Signed)
She is doing better with her sleep-wake schedule since she retired.  Advised that she could try to lower her adderall dose further once her bipolar medications are stablized.  Time spent 35 minutes.

## 2011-06-17 NOTE — Assessment & Plan Note (Signed)
She is followed by Dr. [name].  She will f/u with [facility] to see if she is a candidate for clinical trial there.

## 2011-06-17 NOTE — Patient Instructions (Signed)
Follow up in 6 months 

## 2011-06-17 NOTE — Progress Notes (Signed)
Chief Complaint  Patient presents with  . Follow-up    Pt states she is not needing the adderall much--now takes about 40 mg a day   CC: Brandy [name], Brandy [name]  History of Present Illness: Brandy [name] is a 61 y.o. 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  She his having her bipolar meds adjusted.  She is able to tolerate less dose of adderall since she retired.  She gets about 8 hours sleep per night, and takes one or two naps during the day.    Past Medical History  Diagnosis Date  . Hyperlipidemia   . Idiopathic hypersomnia   . CAD (coronary artery disease)   . Hypertension   . Bipolar 1 disorder     Past Surgical History  Procedure Date  . Trach   . Myomectomy   . Vesicovaginal fistula closure w/ tah   . Rotator cuff repair   . Coronary artery bypass graft   . Shoulder arthroscopy     Allergies  Allergen Reactions  . Codeine   . Meperidine Hcl   . Morphine   . Penicillins   . Pentazocine Lactate     Physical Exam:  Blood pressure 140/78, pulse 98, temperature 98.3 F (36.8 C), temperature source Oral, height 5' (1.524 m), weight 109 lb 12.8 oz (49.805 kg), SpO2 98.00%. Body mass index is 21.44 kg/(m^2). Wt Readings from Last 2 Encounters:  06/17/11 109 lb 12.8 oz (49.805 kg)  03/08/11 106 lb 3.2 oz (48.172 kg)   Psychiatric - Pleasant, normal mood/behavior Remainder of exam deferred   Assessment/Plan:  Outpatient Encounter Prescriptions as of 06/17/2011  Medication Sig Dispense Refill  . amphetamine-dextroamphetamine (ADDERALL XR) 20 MG 24 hr capsule 3 by mouth daily (2 in the morning and 1 midday)  270 capsule  0  . ELESTRIN 0.52 MG/0.87 GM (0.06%) GEL Once a day      . lamoTRIgine (LAMICTAL) 150 MG tablet Take 150 mg by mouth daily.        Marland Kitchen lithium carbonate (ESKALITH) 450 MG CR tablet Take 1 tablet by mouth daily.      . meloxicam (MOBIC) 15 MG tablet Once a day      . rosuvastatin (CRESTOR) 20 MG tablet Take 20 mg by mouth daily.        .  valsartan (DIOVAN) 160 MG tablet Take 160 mg by mouth daily.       . VESICARE 5 MG tablet Take 1 tablet by mouth daily.      Marland Kitchen VIIBRYD 40 MG TABS Take 0.5 tablets by mouth daily.      Marland Kitchen DISCONTD: amphetamine-dextroamphetamine (ADDERALL XR, 20MG ,) 20 MG 24 hr capsule 3 pills once daily in the mornings.  90 capsule  0  . DISCONTD: naproxen sodium (ANAPROX) 220 MG tablet Take 220 mg by mouth 2 (two) times daily with a meal.          Brandy [name] Pager:  [phone] 06/17/2011, 10:39 AM

## 2011-06-20 NOTE — Telephone Encounter (Signed)
Per Mindy, pt had OV with VS on Fri., 06/17/11 and was handed the prescription at that time.

## 2011-08-30 ENCOUNTER — Telehealth: Payer: Self-pay | Admitting: Pulmonary Disease

## 2011-08-30 DIAGNOSIS — G4710 Hypersomnia, unspecified: Secondary | ICD-10-CM

## 2011-08-30 MED ORDER — AMPHETAMINE-DEXTROAMPHET ER 20 MG PO CP24: ORAL_CAPSULE | ORAL | Status: DC

## 2011-08-30 NOTE — Telephone Encounter (Signed)
lmomtcb x1 for pt.  Does she want to pick this up or be mailed?  Does she still get 90 day supply.  Last refilled 06/16/11 #270 x refills

## 2011-08-30 NOTE — Telephone Encounter (Signed)
Spoke with pt and she states needs a 90 day supply mailed to her Rx was printed and placed in VS lookat to be singed, thansk

## 2011-09-01 NOTE — Telephone Encounter (Signed)
I have placed rx in the mail to be sent out to her.

## 2011-09-01 NOTE — Telephone Encounter (Signed)
Script signed and placed in my look at section on Side A.

## 2012-01-16 ENCOUNTER — Ambulatory Visit: Payer: BC Managed Care – PPO | Admitting: Pulmonary Disease

## 2012-01-26 ENCOUNTER — Telehealth: Payer: Self-pay | Admitting: Pulmonary Disease

## 2012-01-26 DIAGNOSIS — G4710 Hypersomnia, unspecified: Secondary | ICD-10-CM

## 2012-01-26 MED ORDER — AMPHETAMINE-DEXTROAMPHET ER 20 MG PO CP24: ORAL_CAPSULE | ORAL | Status: DC

## 2012-01-26 NOTE — Telephone Encounter (Signed)
Pt returned call. Brandy [name]  

## 2012-01-26 NOTE — Telephone Encounter (Signed)
Pt aware VS is not in to sign this for her to p/u in the AM. I advised will call her once ready for p/u. i have printed off RX and will have VS sign this. Nothing further was needed

## 2012-01-26 NOTE — Telephone Encounter (Signed)
lmomtcb x1 

## 2012-02-09 ENCOUNTER — Ambulatory Visit (INDEPENDENT_AMBULATORY_CARE_PROVIDER_SITE_OTHER): Payer: BC Managed Care – PPO | Admitting: Pulmonary Disease

## 2012-02-09 ENCOUNTER — Encounter: Payer: Self-pay | Admitting: Pulmonary Disease

## 2012-02-09 VITALS — BP 120/70 | HR 86 | Temp 97.8°F | Ht 60.0 in | Wt 114.0 lb

## 2012-02-09 DIAGNOSIS — G4710 Hypersomnia, unspecified: Secondary | ICD-10-CM

## 2012-02-09 NOTE — Patient Instructions (Signed)
Follow up in 6 months 

## 2012-02-09 NOTE — Progress Notes (Signed)
Chief Complaint  Patient presents with  . Follow-up    6 mo follow up...pt states that she is taking Aderrall 40-60mg . no change    CC: [name], [name]  History of Present Illness: Brandy [name] is a 62 y.o. 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  She has been doing okay.  She gets about 8 hours of sleep per day, but not all at one time.  She does not have a problem with her current sleep schedule, and likes being able to have a flexible schedule.  Her mood has been doing okay.  She is being evaluated for magnetic therapy to assist with her bipolar/depression.  She denies chest pain, tremors, palpitations, or nausea.   TESTS: PSG 04/30/07 >> AHI 0.5, PLMI 5.2  MSLT 04/30/07 >> mean sleep latency 4 min, 5/5 naps, 0/5 SOREM  Past Medical History  Diagnosis Date  . Hyperlipidemia   . Idiopathic hypersomnia   . CAD (coronary artery disease)   . Hypertension   . Bipolar 1 disorder     Past Surgical History  Procedure Date  . Trach   . Myomectomy   . Vesicovaginal fistula closure w/ tah   . Rotator cuff repair   . Coronary artery bypass graft   . Shoulder arthroscopy     Outpatient Encounter Prescriptions as of 02/09/2012  Medication Sig Dispense Refill  . amphetamine-dextroamphetamine (ADDERALL XR) 20 MG 24 hr capsule 3 by mouth daily (2 in the morning and 1 midday)  270 capsule  0  . ELESTRIN 0.52 MG/0.87 GM (0.06%) GEL Once a day      . lamoTRIgine (LAMICTAL) 150 MG tablet Take 150 mg by mouth daily.        . rosuvastatin (CRESTOR) 20 MG tablet Take 20 mg by mouth daily.        . valsartan (DIOVAN) 160 MG tablet Take 160 mg by mouth daily.       . VESICARE 5 MG tablet Take 1 tablet by mouth daily.      . [DISCONTINUED] lithium carbonate (ESKALITH) 450 MG CR tablet Take 1 tablet by mouth daily.      . [DISCONTINUED] meloxicam (MOBIC) 15 MG tablet Once a day      . [DISCONTINUED] VIIBRYD 40 MG TABS Take 0.5 tablets by mouth daily.        Allergies   Allergen Reactions  . Codeine   . Meperidine Hcl   . Morphine   . Penicillins   . Pentazocine Lactate     Physical Exam:  Filed Vitals:   02/09/12 1607  BP: 120/70  Pulse: 86  Temp: 97.8 F (36.6 C)  TempSrc: Oral  Height: 5' (1.524 m)  Weight: 114 lb (51.71 kg)  SpO2: 97%     Current Encounter SPO2  02/09/12 1607 97%  06/17/11 1030 98%  03/08/11 1727 100%     Body mass index is 22.26 kg/(m^2).   Wt Readings from Last 2 Encounters:  02/09/12 114 lb (51.71 kg)  06/17/11 109 lb 12.8 oz (49.805 kg)     General - No distress ENT - No sinus tenderness, no oral exudate, no LAN Cardiac - s1s2 regular, no murmur Chest - No wheeze/rales/dullness Back - No focal tenderness Abd - Soft, non-tender Ext - No edema Neuro - Normal strength Skin - No rashes Psych - normal mood, and behavior   Assessment/Plan:  [name], MD [facility]/Sleep Pager:  [phone] 02/09/2012, 4:31 PM

## 2012-02-15 NOTE — Assessment & Plan Note (Signed)
Stable on her current regimen of adderall.

## 2012-05-14 ENCOUNTER — Telehealth: Payer: Self-pay | Admitting: Pulmonary Disease

## 2012-05-14 DIAGNOSIS — G4710 Hypersomnia, unspecified: Secondary | ICD-10-CM

## 2012-05-14 NOTE — Telephone Encounter (Signed)
Spoke to pt. She is aware that VS will not be in the office until tomorrow afternoon to sign her prescription. According to the pt, CY has signed her prescriptions in the past. I advised her that VS will need to be the one to sign her prescription, due to the Class of medication it is. She agreed.  Will forward this message to VS to do tomorrow.

## 2012-05-15 MED ORDER — AMPHETAMINE-DEXTROAMPHET ER 20 MG PO CP24: ORAL_CAPSULE | ORAL | Status: DC

## 2012-05-15 NOTE — Telephone Encounter (Signed)
RX has been printed out and signed. Pt is aware RX left upfornt for pick up

## 2012-05-17 ENCOUNTER — Telehealth: Payer: Self-pay | Admitting: Pulmonary Disease

## 2012-05-17 NOTE — Telephone Encounter (Signed)
Spoke with patient patient states the generic adderall is requiring Prior Auth. ATC [facility] x3 and phone rang over 10 times with no answer no options to [employer] for pharmacist or anything. Will need a call back

## 2012-05-18 NOTE — Telephone Encounter (Signed)
Pt would like a status update & asks to be reached at [phone].  Antionette Fairy

## 2012-05-18 NOTE — Telephone Encounter (Signed)
I called [phone], [facility], pt ID# [id number] and initiated PA over the phone. I gave clinical info and PA is being submitted for review. I will forward message to Mindy to f/u on approval/denial. Carron Curie, CMA

## 2012-05-21 NOTE — Telephone Encounter (Signed)
I spoke with edith from [facility]. I was advised this medication was denied. Pt's plan has changed and now pt must have dx of ADHD or narcolepsy in order fort his to be approved. Per Rubin Payor [name] can write a letter for medical necessity and submit this or change tp to something different. Please advise [name] thanks

## 2012-05-24 ENCOUNTER — Telehealth: Payer: Self-pay | Admitting: Pulmonary Disease

## 2012-05-24 NOTE — Telephone Encounter (Signed)
Called and spoke with pt and she is aware that letter will have to be done by VS to the insurance company for her adderall.  VS there is another message in your box on this as well.  Please advise. Thanks  Allergies  Allergen Reactions  . Codeine   . Meperidine Hcl   . Morphine   . Penicillins   . Pentazocine Lactate

## 2012-05-25 NOTE — Telephone Encounter (Signed)
Please schedule ROV for next week to discuss options.  Okay to double book visit.

## 2012-05-25 NOTE — Telephone Encounter (Signed)
Spoke with patient--  Patient has been scheduled to come in Mon 05/28/12 @ 330pm per her request. Nothing further needed at this time

## 2012-05-28 ENCOUNTER — Ambulatory Visit (INDEPENDENT_AMBULATORY_CARE_PROVIDER_SITE_OTHER): Payer: BC Managed Care – PPO | Admitting: Pulmonary Disease

## 2012-05-28 VITALS — BP 132/80 | HR 75 | Temp 97.4°F | Ht 60.0 in | Wt 111.4 lb

## 2012-05-28 DIAGNOSIS — G4710 Hypersomnia, unspecified: Secondary | ICD-10-CM

## 2012-05-28 DIAGNOSIS — F909 Attention-deficit hyperactivity disorder, unspecified type: Secondary | ICD-10-CM

## 2012-05-28 MED ORDER — ARMODAFINIL 150 MG PO TABS: 150.0000 mg | ORAL_TABLET | Freq: Every day | ORAL | Status: DC

## 2012-05-28 NOTE — Telephone Encounter (Signed)
Addressed at [facility] on 05/28/12.

## 2012-05-28 NOTE — Patient Instructions (Signed)
Nuvigil 150 mg one pill daily in morning Follow up in 6 weeks

## 2012-05-28 NOTE — Progress Notes (Signed)
Chief Complaint  Patient presents with  . Follow-up    pt having problems w insurance coverage of adderrall has currently been off almost 2 weeks-- need medication options    CC: Brandy [name], Brandy [name]  History of Present Illness: Brandy [name] is a 62 y.o. 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  She has not been able to fill her adderall prescription.  Her insurance inexplicably decided to no longer covering this medication even though she has been using this for years.  She has been w/o medication for approximately two weeks.  The first few days were very difficult for her, and she had quite a bit of trouble staying awake.  Fortunately she was able to accommodate increased sleep time since she is retired.  She reports feeling more anxious.  She has trouble focusing.  She is having trouble sleeping through night now.  She has trouble with focus and concentration.  She feels fidgety, and this feeling has been present since childhood.  She often finds herself rocking her leg, or tapping her toe when she is sitting.  She often times jumps into middle of other's conversation, and feels her speech is pressured.  Her friends tell her that she is terrible with organizing.  Adult Self-Report Scale V1.1 (ASRS - V1.1) is 4 out of 5.    TESTS: PSG 04/30/07 >> AHI 0.5, PLMI 5.2  MSLT 04/30/07 >> mean sleep latency 4 min, 5/5 naps, 0/5 SOREM  She  has a past medical history of Hyperlipidemia; Idiopathic hypersomnia; CAD (coronary artery disease); Hypertension; and Bipolar 1 disorder.  She  has past surgical history that includes trach; myomectomy; Vesicovaginal fistula closure w/ TAH; Rotator cuff repair; Coronary artery bypass graft; and Shoulder arthroscopy.  Outpatient Encounter Prescriptions as of 05/28/2012  Medication Sig Dispense Refill  . ELESTRIN 0.52 MG/0.87 GM (0.06%) GEL Once a day      . lamoTRIgine (LAMICTAL) 150 MG tablet Take 150 mg by mouth daily.        . VESICARE 5 MG  tablet Take 1 tablet by mouth daily.      . Armodafinil (NUVIGIL) 150 MG tablet Take 1 tablet (150 mg total) by mouth daily.  30 tablet  5  . rosuvastatin (CRESTOR) 20 MG tablet Take 20 mg by mouth daily.        . valsartan (DIOVAN) 160 MG tablet Take 160 mg by mouth daily.       . [DISCONTINUED] amphetamine-dextroamphetamine (ADDERALL XR) 20 MG 24 hr capsule 3 by mouth daily (2 in the morning and 1 midday)  270 capsule  0   No facility-administered encounter medications on file as of 05/28/2012.    Allergies  Allergen Reactions  . Codeine   . Meperidine Hcl   . Morphine   . Penicillins   . Pentazocine Lactate     Physical Exam:  General - No distress, frequently shifting position in chair, rocking leg ENT - No sinus tenderness, no oral exudate, no LAN Cardiac - s1s2 regular, no murmur Chest - No wheeze/rales/dullness Back - No focal tenderness Abd - Soft, non-tender Ext - No edema Neuro - Normal strength Skin - No rashes Psych - normal mood, and behavior   Assessment/Plan:  Brandy Helling, MD [place] [facility]/Critical Care/Sleep Pager:  [phone] 05/29/2012, 12:41 PM

## 2012-05-29 ENCOUNTER — Encounter: Payer: Self-pay | Admitting: Pulmonary Disease

## 2012-05-29 DIAGNOSIS — F909 Attention-deficit hyperactivity disorder, unspecified type: Secondary | ICD-10-CM | POA: Insufficient documentation

## 2012-05-29 NOTE — Assessment & Plan Note (Signed)
Unfortunately she is unable to afford adderall, and her insurance will not cover for this medication.  She has documented hypersomnolence with prior sleep testing.  She is able to adjust her schedule to accommodate more sleep time since she is retired, but this is not ideal for her.  She has effectively undergone washout phase for adderall.  She still needs stimulant medication to help maintain her alertness for daily activities.  I explained that this may be an opportunity to transition to stimulant medication with less concern for long-term cardiovascular side effects and addiction potential.    Will start her on nuvigil 150 mg daily.  Explained she should take this when she wakes up.  Depending on her response will determine if she needs increase in dose, or whether she may need to restart adderall.

## 2012-05-29 NOTE — Assessment & Plan Note (Signed)
She has many features which are consistent with ADHD.  If she does not improve with transition to nuvigil, then will need to consider resuming adderall.  Of note is that her insurance will approve use of adderall if needed for indication of ADHD.  Advised she may need further psychomotor testing with Dr. Jennelle Human to further assess for ADHD.

## 2012-05-31 ENCOUNTER — Ambulatory Visit: Payer: BC Managed Care – PPO | Admitting: Pulmonary Disease

## 2012-07-09 ENCOUNTER — Encounter: Payer: Self-pay | Admitting: Pulmonary Disease

## 2012-07-09 ENCOUNTER — Ambulatory Visit (INDEPENDENT_AMBULATORY_CARE_PROVIDER_SITE_OTHER): Payer: BC Managed Care – PPO | Admitting: Pulmonary Disease

## 2012-07-09 VITALS — BP 132/60 | HR 72 | Temp 98.2°F | Ht 60.0 in | Wt 109.6 lb

## 2012-07-09 DIAGNOSIS — F319 Bipolar disorder, unspecified: Secondary | ICD-10-CM

## 2012-07-09 DIAGNOSIS — G4710 Hypersomnia, unspecified: Secondary | ICD-10-CM

## 2012-07-09 NOTE — Patient Instructions (Signed)
Follow up in 6 months 

## 2012-07-09 NOTE — Progress Notes (Signed)
Chief Complaint  Patient presents with  . Follow-up    Pt is taking nuvigil daily. she states it works "okay". Can't see a huge difference.     CC: Brandy [name], Brandy [name]  History of Present Illness: Brandy [name] is a 62 y.o. 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  She has been feeling more depressed.  She does not have any motivation to do anything.  She feels like if she was going to get run over by a bus she would feel like moving out of the way.  She denies any plans for suicide attempt.  She uses nuvigil sporadically and this helps some.  She doesn't use it much since she doesn't go out much.  She is not keeping up with house care.  She no longer goes out with friends.  She doesn't feel the energy or motivation to do this.  She has appointment with Dr. Jennelle Human on July 17.  She is taking lamictal.  She was prescribed another medication from Dr. Jennelle Human, but is not using this (she does not remember what medicine this was).    TESTS: PSG 04/30/07 >> AHI 0.5, PLMI 5.2  MSLT 04/30/07 >> mean sleep latency 4 min, 5/5 naps, 0/5 SOREM  She  has a past medical history of Hyperlipidemia; Idiopathic hypersomnia; CAD (coronary artery disease); Hypertension; and Bipolar 1 disorder.  She  has past surgical history that includes trach; myomectomy; Vesicovaginal fistula closure w/ TAH; Rotator cuff repair; Coronary artery bypass graft; and Shoulder arthroscopy.  Outpatient Encounter Prescriptions as of 07/09/2012  Medication Sig Dispense Refill  . Armodafinil (NUVIGIL) 150 MG tablet Take 1 tablet (150 mg total) by mouth daily.  30 tablet  5  . lamoTRIgine (LAMICTAL) 150 MG tablet Take 150 mg by mouth daily.        . VESICARE 5 MG tablet Take 1 tablet by mouth daily.      . [DISCONTINUED] ELESTRIN 0.52 MG/0.87 GM (0.06%) GEL Once a day      . [DISCONTINUED] rosuvastatin (CRESTOR) 20 MG tablet Take 20 mg by mouth daily.        . [DISCONTINUED] valsartan (DIOVAN) 160 MG tablet Take 160 mg by mouth  daily.        No facility-administered encounter medications on file as of 07/09/2012.    Allergies  Allergen Reactions  . Codeine   . Meperidine Hcl   . Morphine   . Penicillins   . Pentazocine Lactate     Physical Exam:  General - No distress ENT - No sinus tenderness, no oral exudate, no LAN Cardiac - s1s2 regular, no murmur Chest - No wheeze/rales/dullness Back - No focal tenderness Abd - Soft, non-tender Ext - No edema Neuro - Normal strength Skin - No rashes Psych - Flat affect   Assessment/Plan:  [name], MD  Pulmonary/Critical Care/Sleep Pager:  [phone] 07/09/2012, 3:16 PM

## 2012-07-09 NOTE — Assessment & Plan Note (Signed)
Advised her to contact Dr. Alwyn Ren office today to discuss her depression.  Advised her not to wait until July 17 for her appointment.  Advised her to keep an open mind about treatment options, and to avoid medication changes w/o first discussing with Dr. Jennelle Human.  She has assured me that she will contact Dr. Alwyn Ren office today.

## 2012-07-09 NOTE — Assessment & Plan Note (Signed)
Continue nuvigil in the morning and in afternoon as needed.

## 2012-09-18 ENCOUNTER — Ambulatory Visit: Payer: BC Managed Care – PPO | Admitting: Cardiovascular Disease

## 2012-09-28 ENCOUNTER — Encounter: Payer: Self-pay | Admitting: Cardiology

## 2012-09-28 DIAGNOSIS — G47419 Narcolepsy without cataplexy: Secondary | ICD-10-CM | POA: Insufficient documentation

## 2012-09-28 DIAGNOSIS — G4711 Idiopathic hypersomnia with long sleep time: Secondary | ICD-10-CM | POA: Insufficient documentation

## 2012-09-28 DIAGNOSIS — F319 Bipolar disorder, unspecified: Secondary | ICD-10-CM

## 2012-09-28 DIAGNOSIS — I10 Essential (primary) hypertension: Secondary | ICD-10-CM | POA: Insufficient documentation

## 2012-09-28 DIAGNOSIS — E785 Hyperlipidemia, unspecified: Secondary | ICD-10-CM

## 2012-09-28 DIAGNOSIS — I2510 Atherosclerotic heart disease of native coronary artery without angina pectoris: Secondary | ICD-10-CM | POA: Insufficient documentation

## 2012-10-04 ENCOUNTER — Encounter: Payer: Self-pay | Admitting: Cardiovascular Disease

## 2012-10-04 ENCOUNTER — Other Ambulatory Visit: Payer: Self-pay | Admitting: *Deleted

## 2012-10-04 ENCOUNTER — Ambulatory Visit (INDEPENDENT_AMBULATORY_CARE_PROVIDER_SITE_OTHER): Payer: BC Managed Care – PPO | Admitting: Cardiovascular Disease

## 2012-10-04 VITALS — BP 138/84 | HR 64 | Ht 60.0 in | Wt 111.2 lb

## 2012-10-04 DIAGNOSIS — I2510 Atherosclerotic heart disease of native coronary artery without angina pectoris: Secondary | ICD-10-CM

## 2012-10-04 DIAGNOSIS — F319 Bipolar disorder, unspecified: Secondary | ICD-10-CM

## 2012-10-04 DIAGNOSIS — R5383 Other fatigue: Secondary | ICD-10-CM

## 2012-10-04 DIAGNOSIS — E559 Vitamin D deficiency, unspecified: Secondary | ICD-10-CM

## 2012-10-04 DIAGNOSIS — G4710 Hypersomnia, unspecified: Secondary | ICD-10-CM

## 2012-10-04 DIAGNOSIS — E8881 Metabolic syndrome: Secondary | ICD-10-CM

## 2012-10-04 DIAGNOSIS — E785 Hyperlipidemia, unspecified: Secondary | ICD-10-CM

## 2012-10-04 DIAGNOSIS — G4711 Idiopathic hypersomnia with long sleep time: Secondary | ICD-10-CM

## 2012-10-04 DIAGNOSIS — R5381 Other malaise: Secondary | ICD-10-CM

## 2012-10-04 NOTE — Progress Notes (Signed)
Patient ID: Brandy [name], female   DOB: Oct 30, 1950, 62 y.o.   MRN: [id number]     HPI: Brandy [name], is a 63 y.o. female who presents to the office for cardiology evaluation.  I last saw her 3-1/2 years ago in April 2011.  This Deramo is now 62 years old. Apparently, in 2007 she was not having significant symptoms but because of family history for peripheral vascular disease, abdominal aortic aneurysm, and somewhat labile blood pressure a Cardiolite study was done which suggested anterior wall ischemia. Subsequent cardiac catheterization revealing 95% ostial LAD stenosis. She underwent off pump LIMA to LAD bypass surgery by Dr. [name] on 03/21/2005. She had initially been followed by Dr. Newt Lukes. I saw her in 2011 area an echo Doppler study in April 2001 revealed an ejection fraction greater than 55%. There was grade 1 diastolic dysfunction. She had mild MR and nuclear perfusion scan was normal.  She essentially has not seen a physician over the past 3 years. She's not had recent laboratory in several years. She apparently no longer is taking any cardiac medications. She presents for followup evaluation. She denies any chest pain. She denies any shortness of breath. She is now retired.  Past Medical History  Diagnosis Date  . Hyperlipidemia   . Idiopathic hypersomnia   . CAD (coronary artery disease) 2007    CABG X 1  . Hypertension   . Bipolar 1 disorder     Past Surgical History  Procedure Laterality Date  . Trach    . Myomectomy  1981  . Vesicovaginal fistula closure w/ tah    . Rotator cuff repair  1998  . Coronary artery bypass graft  03/21/05    off pump LIMA-LAD  . Shoulder arthroscopy  2006  . Total abdominal hysterectomy  1991    Allergies  Allergen Reactions  . Codeine   . Meperidine Hcl   . Morphine   . Penicillins   . Pentazocine Lactate     Current Outpatient Prescriptions  Medication Sig Dispense Refill  . Armodafinil (NUVIGIL) 150 MG tablet  Take 1 tablet (150 mg total) by mouth daily.  30 tablet  5  . lamoTRIgine (LAMICTAL) 150 MG tablet Take 150 mg by mouth daily.        Marland Kitchen NAPROXEN PO Take 2 tablets by mouth daily.       No current facility-administered medications for this visit.    History   Social History  . Marital Status: Divorced    Spouse Name: N/A    Number of Children: N/A  . Years of Education: N/A   Occupational History  . Not on file.   Social History Main Topics  . Smoking status: Never Smoker   . Smokeless tobacco: Never Used  . Alcohol Use: No  . Drug Use: Not on file  . Sexual Activity: Not on file   Other Topics Concern  . Not on file   Social History Narrative  . No narrative on file    Family History  Problem Relation Age of Onset  . Stroke Mother     died 91  . Bipolar disorder    . Hypertension    . Lung cancer Father     died 7   Socially she is divorced for 20 years. She is now retired. There is no tobacco use. No alcohol use. She does exercise at least 3 days per week  ROS is negative for fevers, chills or night sweats.   She  denies visual symptoms. She denies any wheezing. She denies chest pressure. She denies abdominal pain she does have a history of bipolar disorder. She denies recent daytime sleepiness. She denies edema appear Other system review is negative.  PE BP 138/84  Pulse 64  Ht 5' (1.524 m)  Wt 111 lb 3.2 oz (50.44 kg)  BMI 21.72 kg/m2  General: Alert, oriented, no distress.  Skin: normal turgor, no rashes HEENT: Normocephalic, atraumatic. Pupils round and reactive; sclera anicteric;no lid lag.  Nose without nasal septal hypertrophy Mouth/Parynx benign; Mallinpatti scale 2 Neck: No JVD, no carotid briuts Lungs: clear to ausculatation and percussion; no wheezing or rales Heart: RRR, s1 s2 normal faint 1/6 systolic murmur Abdomen: soft, nontender; no hepatosplenomehaly, BS+; abdominal aorta nontender and not dilated by palpation. Pulses 2+ Extremities: no  clubbing cyanosis or edema, Homan's sign negative  Neurologic: grossly nonfocal Psychologic: Normal affect and mood presently.  ECG: Sinus rhythm at 64 beats per minute. QTc interval 412 Brandy. Agnostic Q-wave in lead III and F  LABS:  BMET    Component Value Date/Time   NA 144 02/25/2010 1225   K 4.1 02/25/2010 1225   CL 106 02/25/2010 1225   CO2 25 02/25/2010 1225   GLUCOSE 94 02/25/2010 1225   BUN 18 02/25/2010 1225   CREATININE .7 02/25/2010 1225   CALCIUM 9.6 02/25/2010 1225   GFRNONAA >60 02/25/2010 1225   GFRAA  Value: >60        The eGFR has been calculated using the MDRD equation. This calculation has not been validated in all clinical situations. eGFR's persistently <60 mL/min signify possible Chronic Kidney Disease. 02/25/2010 1225     Hepatic Function Panel  No results found for this basename: prot, albumin, ast, alt, alkphos, bilitot, bilidir, ibili     CBC    Component Value Date/Time   WBC 8.0 02/25/2010 1225   RBC 4.90 02/25/2010 1225   HGB 14.3 02/25/2010 1225   HCT 41.9 02/25/2010 1225   PLT 262 02/25/2010 1225   MCV 85.5 02/25/2010 1225   MCH 29.2 02/25/2010 1225   MCHC 34.1 02/25/2010 1225   RDW 13.3 02/25/2010 1225   LYMPHSABS 1.1 02/25/2010 1225   MONOABS 0.4 02/25/2010 1225   EOSABS 0.1 02/25/2010 1225   BASOSABS 0.0 02/25/2010 1225     BNP No results found for this basename: probnp    Lipid Panel  No results found for this basename: chol, trig, hdl, cholhdl, vldl, ldlcalc     RADIOLOGY: No results found.    ASSESSMENT AND PLAN: Brandy [name] is now 7-1/2 years status post CBG revascularization surgery after cardiac catheterization revealed 95% ostial LAD stenosis encroaching upon the left main. She underwent off-pump LIMA to LAD bypass. Prior to her surgery, she was completely asymptomatic and anterior ischemia was noted routinely on Cardiolite imaging. Her last stress test was 3 and half years ago. I am recommending that she undergo a 3 year followup  exercise Myoview study to further evaluate graft patency, scar/ischemia. I'm also recommending a complete her laboratory be obtained. Of note, in 2012 her cholesterol was 249. She's not been on any therapy. I recommended she start taking a baby aspirin 81 mg daily. The completes her laboratory rechecked consisting of a CBC, compressive metabolic panel, hemoglobin A1c, TSH, and NMR lipoprofile. I'll see her back in the office in approximately 4-6 weeks in followup and blood studies and further recommendations made at that time.     Lennette Bihari, MD, [facility]  10/04/2012 10:50 AM

## 2012-10-04 NOTE — Patient Instructions (Addendum)
Your physician has requested that you have en exercise stress myoview. For further information please visit [email or web address] Please follow instruction sheet, as given.  Your physician has recommended you make the following change in your medication: start 81mg  asprin.  Your physician recommends that you return for lab work fasting. You will not need a appointment to have this done.  Your physician recommends that you schedule a follow-up appointment in: 6 WEEKS.

## 2012-10-17 ENCOUNTER — Ambulatory Visit (HOSPITAL_COMMUNITY)
Admission: RE | Admit: 2012-10-17 | Discharge: 2012-10-17 | Disposition: A | Discharge status: Home / Self Care | Payer: BC Managed Care – PPO | Source: Ambulatory Visit | Attending: Cardiovascular Disease | Admitting: Cardiovascular Disease

## 2012-10-17 VITALS — Ht 60.0 in | Wt 111.0 lb

## 2012-10-17 DIAGNOSIS — Z8249 Family history of ischemic heart disease and other diseases of the circulatory system: Secondary | ICD-10-CM | POA: Insufficient documentation

## 2012-10-17 DIAGNOSIS — I10 Essential (primary) hypertension: Secondary | ICD-10-CM | POA: Insufficient documentation

## 2012-10-17 DIAGNOSIS — Z951 Presence of aortocoronary bypass graft: Secondary | ICD-10-CM | POA: Insufficient documentation

## 2012-10-17 DIAGNOSIS — I059 Rheumatic mitral valve disease, unspecified: Secondary | ICD-10-CM | POA: Insufficient documentation

## 2012-10-17 DIAGNOSIS — I2510 Atherosclerotic heart disease of native coronary artery without angina pectoris: Secondary | ICD-10-CM | POA: Insufficient documentation

## 2012-10-17 DIAGNOSIS — R5381 Other malaise: Secondary | ICD-10-CM | POA: Insufficient documentation

## 2012-10-17 MED ORDER — TECHNETIUM TC 99M SESTAMIBI GENERIC - CARDIOLITE
10.4000 | Freq: Once | INTRAVENOUS | Status: AC | PRN
  Administered 2012-10-17: 10 via INTRAVENOUS

## 2012-10-17 MED ORDER — TECHNETIUM TC 99M SESTAMIBI GENERIC - CARDIOLITE
31.8000 | Freq: Once | INTRAVENOUS | Status: AC | PRN
  Administered 2012-10-17: 31.8 via INTRAVENOUS

## 2012-10-17 NOTE — Procedures (Addendum)
[facility] [address] [place] [address] [place] [address] [phone]  Cardiology Nuclear Med Study  Brandy [name] is a 62 y.o. female     MRN : [id number]     DOB: Mar 19, 1950  Procedure Date: 10/17/2012  Nuclear Med Background Indication for Stress Test:  Evaluation for Ischemia and Graft Patency History:  MVP and CAD;CABG X1 --03/21/2005 Cardiac Risk Factors: Family History - CAD, Hypertension and Lipids  Symptoms:  Fatigue   Nuclear Pre-Procedure Caffeine/Decaff Intake:  8:00pm NPO After: 6:00am   IV Site: R Hand  IV 0.9% NS with Angio Cath:  22g  Chest Size (in):  N/A IV Started by: Emmit Pomfret, RN  Height: 5' (1.524 m)  Cup Size: D  BMI:  Body mass index is 21.68 kg/(m^2). Weight:  111 lb (50.349 kg)   Tech Comments:  N/A    Nuclear Med Study 1 or 2 day study: 1 day  Stress Test Type:  Stress  Order Authorizing Provider:  [name], MD   Resting Radionuclide: Technetium 90m Sestamibi  Resting Radionuclide Dose: 10.4 mCi   Stress Radionuclide:  Technetium 41m Sestamibi  Stress Radionuclide Dose: 31.8 mCi           Stress Protocol Rest HR: 64 Stress HR:133  Rest BP:156/96 Stress BP: 215/84  Exercise Time (min): 13:46 METS: 13.60   Predicted Max HR: 156 bpm % Max HR: 85.26 bpm Rate Pressure Product: 52841  Dose of Adenosine (mg):  n/a Dose of Lexiscan: n/a mg  Dose of Atropine (mg): n/a Dose of Dobutamine: n/a mcg/kg/min (at max HR)  Stress Test Technologist: Ernestene Mention, CCT Nuclear Technologist: Gonzella Lex, CNMT   Rest Procedure:  Myocardial perfusion imaging was performed at rest 45 minutes following the intravenous administration of Technetium 51m Sestamibi. Stress Procedure:  The patient performed treadmill exercise using a Bruce  Protocol for 13 minutes and 46 seconds. The patient stopped due to shortness of breath and fatigue. Patient denied any chest pain.  There were no significant ST-T wave  changes.  Technetium 46m Sestamibi was injected at peak exercise and myocardial perfusion imaging was performed after a brief delay.  Transient Ischemic Dilatation (Normal <1.22):  1.02 Lung/Heart Ratio (Normal <0.45):  0.29 QGS EDV:  46 ml QGS ESV:  6 ml LV Ejection Fraction: 86%  Rest ECG: NSR - Normal EKG  Stress ECG: No significant ST segment change suggestive of ischemia.  QPS Raw Data Images:  Normal; no motion artifact; normal heart/lung ratio. Stress Images:  Normal homogeneous uptake in all areas of the myocardium. Rest Images:  Normal homogeneous uptake in all areas of the myocardium. Subtraction (SDS):  Normal  Impression Exercise Capacity:  Excellent exercise capacity. BP Response:  Hypertensive blood pressure response. Clinical Symptoms:  There is dyspnea. ECG Impression:  No significant ST segment change suggestive of ischemia. Comparison with Prior Nuclear Study: No significant change from previous study in 2011  Overall Impression:  Normal stress nuclear study.  LV Wall Motion:  NL LV Function; NL Wall Motion; EF 86%  Chrystie Nose, MD, [facility] Board Certified in Nuclear Cardiology Attending Cardiologist [facility]  Chrystie Nose, MD  10/17/2012 1:36 PM

## 2012-10-23 ENCOUNTER — Telehealth: Payer: Self-pay | Admitting: *Deleted

## 2012-10-23 NOTE — Progress Notes (Signed)
Quick Note:  Left message nuc normal. Keep November appointment as scheduled. ______

## 2012-10-23 NOTE — Telephone Encounter (Signed)
Message copied by Gaynelle Cage on Tue Oct 23, 2012  5:42 PM ------      Message from: [name] A      Created: Mon Oct 22, 2012  5:04 PM       Nl nuc ------

## 2012-10-23 NOTE — Progress Notes (Signed)
Quick Note:  Left message on cell # nuc normal keep November appointment as scheduled. ______

## 2012-10-23 NOTE — Telephone Encounter (Signed)
Left message nuc normal. Keep November appointment as scheduled.

## 2012-11-09 ENCOUNTER — Encounter: Payer: Self-pay | Admitting: Cardiovascular Disease

## 2012-11-15 ENCOUNTER — Ambulatory Visit: Payer: BC Managed Care – PPO | Admitting: Cardiovascular Disease

## 2013-01-01 ENCOUNTER — Telehealth: Payer: Self-pay | Admitting: Pulmonary Disease

## 2013-01-01 NOTE — Telephone Encounter (Signed)
called patient and lm x3. sent letter 12/23 °

## 2013-01-06 ENCOUNTER — Other Ambulatory Visit: Payer: Self-pay | Admitting: Pulmonary Disease

## 2013-01-07 NOTE — Telephone Encounter (Signed)
Please advise on refill. Thanks. 

## 2013-02-01 ENCOUNTER — Other Ambulatory Visit: Payer: Self-pay | Admitting: Pulmonary Disease

## 2013-02-01 NOTE — Telephone Encounter (Signed)
Please advise on refill. Thanks. 

## 2013-04-01 ENCOUNTER — Other Ambulatory Visit: Payer: Self-pay | Admitting: Sports Medicine

## 2013-04-01 DIAGNOSIS — M545 Low back pain, unspecified: Secondary | ICD-10-CM

## 2013-04-11 DIAGNOSIS — F322 Major depressive disorder, single episode, severe without psychotic features: Secondary | ICD-10-CM | POA: Diagnosis not present

## 2013-04-12 ENCOUNTER — Ambulatory Visit
Admission: RE | Admit: 2013-04-12 | Discharge: 2013-04-12 | Disposition: A | Discharge status: Home / Self Care | Payer: Medicare Other | Source: Ambulatory Visit | Attending: Sports Medicine | Admitting: Sports Medicine

## 2013-04-12 DIAGNOSIS — M545 Low back pain, unspecified: Secondary | ICD-10-CM

## 2013-04-12 DIAGNOSIS — M169 Osteoarthritis of hip, unspecified: Secondary | ICD-10-CM | POA: Diagnosis not present

## 2013-04-12 DIAGNOSIS — M1610 Unilateral primary osteoarthritis, unspecified hip: Secondary | ICD-10-CM | POA: Diagnosis not present

## 2013-04-12 DIAGNOSIS — M25559 Pain in unspecified hip: Secondary | ICD-10-CM | POA: Diagnosis not present

## 2013-04-12 MED ORDER — IOHEXOL 180 MG/ML  SOLN
10.0000 mL | Freq: Once | INTRAMUSCULAR | Status: AC | PRN
  Administered 2013-04-12: 10 mL via INTRA_ARTICULAR

## 2013-04-18 DIAGNOSIS — M1610 Unilateral primary osteoarthritis, unspecified hip: Secondary | ICD-10-CM | POA: Diagnosis not present

## 2013-04-18 DIAGNOSIS — M25559 Pain in unspecified hip: Secondary | ICD-10-CM | POA: Diagnosis not present

## 2013-04-18 DIAGNOSIS — M169 Osteoarthritis of hip, unspecified: Secondary | ICD-10-CM | POA: Diagnosis not present

## 2013-05-07 ENCOUNTER — Ambulatory Visit (INDEPENDENT_AMBULATORY_CARE_PROVIDER_SITE_OTHER): Payer: Medicare Other | Admitting: Pulmonary Disease

## 2013-05-07 ENCOUNTER — Encounter: Payer: Self-pay | Admitting: Pulmonary Disease

## 2013-05-07 VITALS — BP 138/92 | HR 66 | Ht 60.0 in | Wt 112.0 lb

## 2013-05-07 DIAGNOSIS — G4710 Hypersomnia, unspecified: Secondary | ICD-10-CM | POA: Diagnosis not present

## 2013-05-07 DIAGNOSIS — N959 Unspecified menopausal and perimenopausal disorder: Secondary | ICD-10-CM | POA: Diagnosis not present

## 2013-05-07 DIAGNOSIS — G4711 Idiopathic hypersomnia with long sleep time: Secondary | ICD-10-CM

## 2013-05-07 MED ORDER — MODAFINIL 100 MG PO TABS: 100.0000 mg | ORAL_TABLET | Freq: Every day | ORAL | Status: DC

## 2013-05-07 NOTE — Assessment & Plan Note (Signed)
She has difficulty affording stimulant medication.  Will have her try modafanil >> she will check with her insurance company about coverage for this.

## 2013-05-07 NOTE — Patient Instructions (Signed)
Modafinil (provigil) 100 mg daily as needed >> can take additional dose in afternoon if needed Follow up in 6 months

## 2013-05-07 NOTE — Progress Notes (Signed)
Chief Complaint  Patient presents with  . Hypersomnia    Nuvigil is no longer working to her liking. Insurance will not cover since going on Medicare.    CC: Brandy [name]  History of Present Illness: Brandy [name] is a 63 y.o. 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  She is not able to afford nuvigil.  She stopped lamictal >> this helped her avoid getting crashes of her mood and energy.  She feels tired, and not much energy.  She can sleep up to 12 hours per day.  She has noticed more trouble with hip pain and this causes trouble with her sleep.   TESTS: PSG 04/30/07 >> AHI 0.5, PLMI 5.2  MSLT 04/30/07 >> mean sleep latency 4 min, 5/5 naps, 0/5 SOREM  She  has a past medical history of Hyperlipidemia; Idiopathic hypersomnia; CAD (coronary artery disease) (2007); Hypertension; and Bipolar 1 disorder.  She  has past surgical history that includes trach; myomectomy (1981); Vesicovaginal fistula closure w/ TAH; Rotator cuff repair (1998); Coronary artery bypass graft (03/21/05); Shoulder arthroscopy (2006); and Total abdominal hysterectomy (1991).  Outpatient Encounter Prescriptions as of 05/07/2013  Medication Sig  . celecoxib (CELEBREX) 200 MG capsule Take 200 mg by mouth daily.  Marland Kitchen FLUoxetine (PROZAC) 20 MG tablet Take 20 mg by mouth daily.  Marland Kitchen NUVIGIL 150 MG tablet TAKE 1 TABLET BY MOUTH EVERY DAY  . [DISCONTINUED] lamoTRIgine (LAMICTAL) 150 MG tablet Take 150 mg by mouth daily.    . [DISCONTINUED] NAPROXEN PO Take 2 tablets by mouth daily.    Allergies  Allergen Reactions  . Codeine   . Meperidine Hcl   . Morphine   . Penicillins   . Pentazocine Lactate     Physical Exam:  General - No distress ENT - No sinus tenderness, no oral exudate, no LAN Cardiac - s1s2 regular, no murmur Chest - No wheeze/rales/dullness Back - No focal tenderness Abd - Soft, non-tender Ext - No edema Neuro - Normal strength Skin - No rashes Psych - Flat affect   Assessment/Plan:  Brandy Mires, MD [place] [facility]Critical [facility]/Sleep Pager:  [phone] 05/07/2013, 9:54 AM

## 2013-05-14 DIAGNOSIS — M1710 Unilateral primary osteoarthritis, unspecified knee: Secondary | ICD-10-CM | POA: Diagnosis not present

## 2013-08-16 DIAGNOSIS — F322 Major depressive disorder, single episode, severe without psychotic features: Secondary | ICD-10-CM | POA: Diagnosis not present

## 2013-09-27 ENCOUNTER — Telehealth: Payer: Self-pay | Admitting: Pulmonary Disease

## 2013-09-27 NOTE — Telephone Encounter (Signed)
Spoke with the pt  She states started taking naps during the day here and there as needed, and now she has been sleeping all day and none at night  She is not taking any stimulants due to ins coverage issues  I have scheduled appt for her to see VS 10/03/13 at 1:45 pm

## 2013-10-03 ENCOUNTER — Encounter: Payer: Self-pay | Admitting: Pulmonary Disease

## 2013-10-03 ENCOUNTER — Ambulatory Visit (INDEPENDENT_AMBULATORY_CARE_PROVIDER_SITE_OTHER): Payer: Medicare Other | Admitting: Pulmonary Disease

## 2013-10-03 VITALS — BP 130/90 | HR 98 | Temp 97.1°F | Ht 60.0 in | Wt 114.0 lb

## 2013-10-03 DIAGNOSIS — G4723 Circadian rhythm sleep disorder, irregular sleep wake type: Secondary | ICD-10-CM

## 2013-10-03 DIAGNOSIS — G4711 Idiopathic hypersomnia with long sleep time: Secondary | ICD-10-CM

## 2013-10-03 DIAGNOSIS — G4710 Hypersomnia, unspecified: Secondary | ICD-10-CM | POA: Diagnosis not present

## 2013-10-03 MED ORDER — MELATONIN 3 MG PO TABS: ORAL_TABLET | ORAL | Status: DC

## 2013-10-03 NOTE — Progress Notes (Signed)
Chief Complaint  Patient presents with  . Follow-up    Pt no longer using Provigil d/t insurance coverage. Pt states that she has not been using anything as treatment-- states that her days and nights are opposite now; reports having a hard time staying awke during the day and sleeping at night.    CC: Brandy [name]  History of Present Illness: Brandy [name] is a 63 y.o. 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  Her insurance would not cover provigil.  She has not used stimulant medication for months.  She is only taking prozac for her mood disorder.  This controls her depression well.  She does not have issues with mania at present.  Her main difficulty relates to her sleep schedule.  She goes to bed at different times of the night (12 am to 4 am).  She will not fall asleep for hours sometimes.  She will then end up sleeping for 2 to 3 hours at a stretch at different times during the day.  She feels like a slug, and feels like she can't do things she wants because she is asleep all day.  She will then keep herself busy at night by eating.  She was having trouble with hip pain that disrupted her sleep >> she got steroid injection and uses celebrex.  Her pain is much better.   TESTS: PSG 04/30/07 >> AHI 0.5, PLMI 5.2  MSLT 04/30/07 >> mean sleep latency 4 min, 5/5 naps, 0/5 SOREM  PMHx, PSHx, Medications, Allergies, Fhx, Shx reviewed.  [name], MD [place]/Sleep Pager:  [phone] 10/03/2013, 1:53 PM

## 2013-10-03 NOTE — Patient Instructions (Signed)
Take melatonin 3 to 5 mg nightly one hour before desired bedtime Maintain a regular time to go to bed and wake up in the morning >> do not sleep outside of this time period Try to get sun light exposure first thing in the morning after you wake up Try exercising in the middle part of the day Follow up in 6 months

## 2013-10-03 NOTE — Assessment & Plan Note (Signed)
Since she has retired she has been able to be more liberal with her sleep pattern.  As a result she does not need to resume stimulant medication at this time.

## 2013-10-03 NOTE — Assessment & Plan Note (Signed)
Discussed importance of proper sleep hygiene.  Emphasized need to maintain a regular sleep/wake schedule, and to avoid sleeping during the day.  She should get sun light exposure in the morning, and can use melatonin 3 to 5 mg nightly 1 hour before desired bedtime >> explained she needs to use melatonin on regular basis for several weeks.  Also asked her to try exercising in the middle part of the day.  She will call if she is still having trouble.  Explained that she might be able to get coverage for stimulant (nuvigil/provigil) based on mood disorder diagnosis, but this would need to be approved by Dr. Clovis Pu.  I don't think she needs to resume stimulant medication at this time.

## 2013-11-08 DIAGNOSIS — F322 Major depressive disorder, single episode, severe without psychotic features: Secondary | ICD-10-CM | POA: Diagnosis not present

## 2013-11-14 DIAGNOSIS — M1612 Unilateral primary osteoarthritis, left hip: Secondary | ICD-10-CM | POA: Diagnosis not present

## 2013-11-19 DIAGNOSIS — M25552 Pain in left hip: Secondary | ICD-10-CM | POA: Diagnosis not present

## 2013-11-19 DIAGNOSIS — G8929 Other chronic pain: Secondary | ICD-10-CM | POA: Diagnosis not present

## 2013-12-03 ENCOUNTER — Telehealth: Payer: Self-pay | Admitting: Pulmonary Disease

## 2013-12-03 NOTE — Telephone Encounter (Signed)
lmomtcb x1 

## 2013-12-04 NOTE — Telephone Encounter (Signed)
Called and spoke to pt. Pt is requesting generic provigil 100mg . Pt stated she would be able to fill the generic script at [facility] for around $40/month. Pt stated she is sleeping 4 plus hours during the day and 8 plus hours during the night. Pt has not had provigil for several months d/t cost of med. Pt last seen 10/03/13 by VS.  VS please advise if ok to send script.  Allergies  Allergen Reactions  . Codeine   . Meperidine Hcl   . Morphine   . Penicillins   . Pentazocine Lactate

## 2013-12-09 NOTE — Telephone Encounter (Signed)
I spoke with the pt and advised. She does not know where she wants Rx sent. She wants to call around to see where is the cheapest and will call us back with the pharmacy name so we can call in rx. Await pt call back.  [place], CMA

## 2013-12-09 NOTE — Telephone Encounter (Signed)
Okay to send script for modafanil 100 mg daily.  Dispense 30 pills with 5 refills.

## 2013-12-11 MED ORDER — MODAFINIL 100 MG PO TABS: 100.0000 mg | ORAL_TABLET | Freq: Every day | ORAL | Status: DC

## 2013-12-11 NOTE — Telephone Encounter (Signed)
lmomtcb x 1  For the pt.

## 2013-12-11 NOTE — Telephone Encounter (Signed)
Called and spoke to pt. Informed pt of the refill. Rx sent. Pt aware. Nothing further needed.

## 2013-12-11 NOTE — Telephone Encounter (Signed)
Pt returned call to our office; [facility] is the pharmacy would like Rx sent to.

## 2014-01-01 DIAGNOSIS — M1612 Unilateral primary osteoarthritis, left hip: Secondary | ICD-10-CM | POA: Diagnosis not present

## 2014-02-06 DIAGNOSIS — M1611 Unilateral primary osteoarthritis, right hip: Secondary | ICD-10-CM | POA: Diagnosis not present

## 2014-02-06 DIAGNOSIS — M25551 Pain in right hip: Secondary | ICD-10-CM | POA: Diagnosis not present

## 2014-02-25 DIAGNOSIS — Z1231 Encounter for screening mammogram for malignant neoplasm of breast: Secondary | ICD-10-CM | POA: Diagnosis not present

## 2014-02-25 DIAGNOSIS — Z124 Encounter for screening for malignant neoplasm of cervix: Secondary | ICD-10-CM | POA: Diagnosis not present

## 2014-02-25 DIAGNOSIS — Z6822 Body mass index (BMI) 22.0-22.9, adult: Secondary | ICD-10-CM | POA: Diagnosis not present

## 2014-02-25 DIAGNOSIS — Z01419 Encounter for gynecological examination (general) (routine) without abnormal findings: Secondary | ICD-10-CM | POA: Diagnosis not present

## 2014-02-25 DIAGNOSIS — Z1272 Encounter for screening for malignant neoplasm of vagina: Secondary | ICD-10-CM | POA: Diagnosis not present

## 2014-02-27 ENCOUNTER — Other Ambulatory Visit: Payer: Self-pay | Admitting: Obstetrics & Gynecology

## 2014-02-27 DIAGNOSIS — R928 Other abnormal and inconclusive findings on diagnostic imaging of breast: Secondary | ICD-10-CM

## 2014-03-10 ENCOUNTER — Other Ambulatory Visit: Payer: Self-pay | Admitting: Obstetrics & Gynecology

## 2014-03-10 ENCOUNTER — Ambulatory Visit
Admission: RE | Admit: 2014-03-10 | Discharge: 2014-03-10 | Disposition: A | Discharge status: Home / Self Care | Payer: Medicare Other | Source: Ambulatory Visit | Attending: Obstetrics & Gynecology | Admitting: Obstetrics & Gynecology

## 2014-03-10 DIAGNOSIS — R928 Other abnormal and inconclusive findings on diagnostic imaging of breast: Secondary | ICD-10-CM

## 2014-03-10 DIAGNOSIS — N63 Unspecified lump in breast: Secondary | ICD-10-CM | POA: Diagnosis not present

## 2014-03-10 DIAGNOSIS — N6310 Unspecified lump in the right breast, unspecified quadrant: Secondary | ICD-10-CM

## 2014-03-12 ENCOUNTER — Ambulatory Visit
Admission: RE | Admit: 2014-03-12 | Discharge: 2014-03-12 | Disposition: A | Discharge status: Home / Self Care | Payer: Medicare Other | Source: Ambulatory Visit | Attending: Obstetrics & Gynecology | Admitting: Obstetrics & Gynecology

## 2014-03-12 DIAGNOSIS — N63 Unspecified lump in breast: Secondary | ICD-10-CM | POA: Diagnosis not present

## 2014-03-12 DIAGNOSIS — R928 Other abnormal and inconclusive findings on diagnostic imaging of breast: Secondary | ICD-10-CM

## 2014-03-12 DIAGNOSIS — N6310 Unspecified lump in the right breast, unspecified quadrant: Secondary | ICD-10-CM

## 2014-03-12 DIAGNOSIS — N6489 Other specified disorders of breast: Secondary | ICD-10-CM | POA: Diagnosis not present

## 2014-03-26 DIAGNOSIS — Z951 Presence of aortocoronary bypass graft: Secondary | ICD-10-CM | POA: Diagnosis not present

## 2014-03-26 DIAGNOSIS — I10 Essential (primary) hypertension: Secondary | ICD-10-CM | POA: Diagnosis not present

## 2014-03-26 DIAGNOSIS — N6489 Other specified disorders of breast: Secondary | ICD-10-CM | POA: Diagnosis not present

## 2014-05-06 DIAGNOSIS — F322 Major depressive disorder, single episode, severe without psychotic features: Secondary | ICD-10-CM | POA: Diagnosis not present

## 2014-08-11 DIAGNOSIS — M25552 Pain in left hip: Secondary | ICD-10-CM | POA: Diagnosis not present

## 2014-08-11 DIAGNOSIS — M1612 Unilateral primary osteoarthritis, left hip: Secondary | ICD-10-CM | POA: Diagnosis not present

## 2014-09-09 DIAGNOSIS — M1612 Unilateral primary osteoarthritis, left hip: Secondary | ICD-10-CM | POA: Diagnosis not present

## 2014-10-09 ENCOUNTER — Other Ambulatory Visit: Payer: Self-pay | Admitting: General Surgery

## 2014-10-09 DIAGNOSIS — N63 Unspecified lump in unspecified breast: Secondary | ICD-10-CM

## 2014-10-10 ENCOUNTER — Other Ambulatory Visit: Payer: Self-pay | Admitting: General Surgery

## 2014-10-14 ENCOUNTER — Other Ambulatory Visit: Payer: Self-pay | Admitting: General Surgery

## 2014-10-17 ENCOUNTER — Other Ambulatory Visit: Payer: Medicare Other

## 2014-10-21 DIAGNOSIS — F322 Major depressive disorder, single episode, severe without psychotic features: Secondary | ICD-10-CM | POA: Diagnosis not present

## 2015-01-29 DIAGNOSIS — M1612 Unilateral primary osteoarthritis, left hip: Secondary | ICD-10-CM | POA: Diagnosis not present

## 2015-03-31 DIAGNOSIS — F322 Major depressive disorder, single episode, severe without psychotic features: Secondary | ICD-10-CM | POA: Diagnosis not present

## 2015-05-04 ENCOUNTER — Other Ambulatory Visit: Payer: Self-pay | Admitting: General Surgery

## 2015-05-04 ENCOUNTER — Ambulatory Visit
Admission: RE | Admit: 2015-05-04 | Discharge: 2015-05-04 | Disposition: A | Discharge status: Home / Self Care | Payer: Medicare Other | Source: Ambulatory Visit | Attending: General Surgery | Admitting: General Surgery

## 2015-05-04 DIAGNOSIS — R921 Mammographic calcification found on diagnostic imaging of breast: Secondary | ICD-10-CM | POA: Diagnosis not present

## 2015-05-04 DIAGNOSIS — N63 Unspecified lump in unspecified breast: Secondary | ICD-10-CM

## 2015-05-04 DIAGNOSIS — N6310 Unspecified lump in the right breast, unspecified quadrant: Secondary | ICD-10-CM

## 2015-05-05 ENCOUNTER — Ambulatory Visit
Admission: RE | Admit: 2015-05-05 | Discharge: 2015-05-05 | Disposition: A | Discharge status: Home / Self Care | Payer: Medicare Other | Source: Ambulatory Visit | Attending: General Surgery | Admitting: General Surgery

## 2015-05-05 ENCOUNTER — Other Ambulatory Visit: Payer: Self-pay | Admitting: General Surgery

## 2015-05-05 DIAGNOSIS — D1809 Hemangioma of other sites: Secondary | ICD-10-CM | POA: Diagnosis not present

## 2015-05-05 DIAGNOSIS — N6310 Unspecified lump in the right breast, unspecified quadrant: Secondary | ICD-10-CM

## 2015-05-05 DIAGNOSIS — N63 Unspecified lump in breast: Secondary | ICD-10-CM | POA: Diagnosis not present

## 2015-05-12 DIAGNOSIS — F322 Major depressive disorder, single episode, severe without psychotic features: Secondary | ICD-10-CM | POA: Diagnosis not present

## 2015-05-26 DIAGNOSIS — I10 Essential (primary) hypertension: Secondary | ICD-10-CM | POA: Diagnosis not present

## 2015-05-26 DIAGNOSIS — Z951 Presence of aortocoronary bypass graft: Secondary | ICD-10-CM | POA: Diagnosis not present

## 2015-05-26 DIAGNOSIS — N6489 Other specified disorders of breast: Secondary | ICD-10-CM | POA: Diagnosis not present

## 2015-05-28 DIAGNOSIS — M1612 Unilateral primary osteoarthritis, left hip: Secondary | ICD-10-CM | POA: Diagnosis not present

## 2015-07-31 DIAGNOSIS — R151 Fecal smearing: Secondary | ICD-10-CM | POA: Diagnosis not present

## 2015-07-31 DIAGNOSIS — R197 Diarrhea, unspecified: Secondary | ICD-10-CM | POA: Diagnosis not present

## 2015-07-31 DIAGNOSIS — L290 Pruritus ani: Secondary | ICD-10-CM | POA: Diagnosis not present

## 2015-07-31 DIAGNOSIS — Z2089 Contact with and (suspected) exposure to other communicable diseases: Secondary | ICD-10-CM | POA: Diagnosis not present

## 2015-08-05 DIAGNOSIS — Z6821 Body mass index (BMI) 21.0-21.9, adult: Secondary | ICD-10-CM | POA: Diagnosis not present

## 2015-08-05 DIAGNOSIS — N958 Other specified menopausal and perimenopausal disorders: Secondary | ICD-10-CM | POA: Diagnosis not present

## 2015-08-05 DIAGNOSIS — M8588 Other specified disorders of bone density and structure, other site: Secondary | ICD-10-CM | POA: Diagnosis not present

## 2015-08-05 DIAGNOSIS — Z01419 Encounter for gynecological examination (general) (routine) without abnormal findings: Secondary | ICD-10-CM | POA: Diagnosis not present

## 2015-08-05 DIAGNOSIS — R351 Nocturia: Secondary | ICD-10-CM | POA: Diagnosis not present

## 2015-08-07 ENCOUNTER — Telehealth: Payer: Self-pay | Admitting: Cardiovascular Disease

## 2015-08-07 DIAGNOSIS — L290 Pruritus ani: Secondary | ICD-10-CM | POA: Diagnosis not present

## 2015-08-07 DIAGNOSIS — R151 Fecal smearing: Secondary | ICD-10-CM | POA: Diagnosis not present

## 2015-08-07 DIAGNOSIS — R197 Diarrhea, unspecified: Secondary | ICD-10-CM | POA: Diagnosis not present

## 2015-08-07 NOTE — Telephone Encounter (Signed)
Returned call to patient. She states she has labile HTN  Patient has not seen Dr. Claiborne Billings since 09/2012  Patient states when she saw Dr. Einar Gip she took [place]  0000000 - diastolic [id number] @ OB office   Patient saw another MD yesterday but she cannot recall her BP but says it was high  Informed her she can get an appt w/PA or NP sooner than Dr. Claiborne Billings and she is OK with this.   Message routed to [facility] scheduling pool to arrange appointment

## 2015-08-07 NOTE — Telephone Encounter (Signed)
Pt's blood pressure have been running high lately.she thinks she needs to be seen by somebody.Dr Claiborne Billings first available appt is in October.

## 2015-08-07 NOTE — Telephone Encounter (Signed)
Called patient and scheduled her to see Brandy [name] on 08-11-15 at 10:45 at the [facility] location.

## 2015-08-11 ENCOUNTER — Encounter: Payer: Self-pay | Admitting: Physician Assistant

## 2015-08-11 ENCOUNTER — Ambulatory Visit (INDEPENDENT_AMBULATORY_CARE_PROVIDER_SITE_OTHER): Payer: Medicare Other | Admitting: Physician Assistant

## 2015-08-11 ENCOUNTER — Encounter (INDEPENDENT_AMBULATORY_CARE_PROVIDER_SITE_OTHER): Payer: Self-pay

## 2015-08-11 VITALS — BP 170/62 | HR 52 | Ht 60.0 in | Wt 115.0 lb

## 2015-08-11 DIAGNOSIS — I10 Essential (primary) hypertension: Secondary | ICD-10-CM | POA: Diagnosis not present

## 2015-08-11 DIAGNOSIS — E785 Hyperlipidemia, unspecified: Secondary | ICD-10-CM | POA: Diagnosis not present

## 2015-08-11 DIAGNOSIS — I2510 Atherosclerotic heart disease of native coronary artery without angina pectoris: Secondary | ICD-10-CM

## 2015-08-11 LAB — CBC WITH DIFFERENTIAL/PLATELET
BASOS ABS: 64 {cells}/uL (ref 0–200)
Basophils Relative: 1 %
EOS ABS: 192 {cells}/uL (ref 15–500)
Eosinophils Relative: 3 %
HCT: 38.8 % (ref 35.0–45.0)
Hemoglobin: 12.8 g/dL (ref 11.7–15.5)
LYMPHS PCT: 25 %
Lymphs Abs: 1600 cells/uL (ref 850–3900)
MCH: 30 pg (ref 27.0–33.0)
MCHC: 33 g/dL (ref 32.0–36.0)
MCV: 91.1 fL (ref 80.0–100.0)
MONOS PCT: 7 %
MPV: 10.7 fL (ref 7.5–12.5)
Monocytes Absolute: 448 cells/uL (ref 200–950)
Neutro Abs: 4096 cells/uL (ref 1500–7800)
Neutrophils Relative %: 64 %
PLATELETS: 272 10*3/uL (ref 140–400)
RBC: 4.26 MIL/uL (ref 3.80–5.10)
RDW: 13.4 % (ref 11.0–15.0)
WBC: 6.4 10*3/uL (ref 3.8–10.8)

## 2015-08-11 MED ORDER — ROSUVASTATIN CALCIUM 20 MG PO TABS: 20.0000 mg | ORAL_TABLET | Freq: Every day | ORAL | 0 refills | Status: DC

## 2015-08-11 MED ORDER — VALSARTAN 80 MG PO TABS: 80.0000 mg | ORAL_TABLET | Freq: Every day | ORAL | 0 refills | Status: DC

## 2015-08-11 NOTE — Progress Notes (Signed)
Cardiology Office Note    Date:  08/11/2015   ID:  Brandy [name], DOB Jan 22, 1950, MRN [id number]  PCP:  No PCP Per Patient  Cardiologist: Dr. Claiborne Billings  No chief complaint on file.   History of Present Illness:  Brandy [name] is a 65 y.o. female with history of CAD dating back to 2007.                                                        She was not having significant symptoms but because of family history for peripheral vascular disease, abdominal aortic aneurysm, and somewhat labile blood pressure a Cardiolite study was done which suggested anterior wall ischemia. Subsequent cardiac catheterization revealing 95% ostial LAD stenosis. She underwent off pump LIMA to LAD bypass surgery by Dr. Roxan Hockey on 03/21/2005. She had initially been followed by Dr. Duke Salvia.   Dr. Claiborne Billings saw her in 2011 area an echo Doppler study in April 2001 revealed an ejection fraction greater than 55%. There was grade 1 diastolic dysfunction. She had mild MR and nuclear perfusion scan was normal.  He then saw her in 2014 after a three-year absence and stopping all her medications. He ordered a nuclear stress test that was normal. She started aspirin.  Patient was added onto my schedule today because   her blood pressures have been high. She saw her OB/GYN last week and BP 171/100.it was 167/96 before that. Had been on Diovan in the past and tolerated it well but stopped it on her own. She says her blood pressures have always fluctuated. She is not taking aspirin and used to be on Crestor for her cholesterol but stopped this as well. She denies any chest pain, palpitations, dyspnea, dyspnea on exertion, dizziness or presyncope. She tries to watch her salt but knows it's and a lot of food she eats.  Has personal trainer and works out regularly. She is currently seen Dr. Earlean Shawl for what sounds like irritable bowel syndrome. Celiac disease was ruled out.    Past Medical History:  Diagnosis Date  . Bipolar 1  disorder ([place])   . CAD (coronary artery disease) 2007   CABG X 1  . Hyperlipidemia   . Hypertension   . Idiopathic hypersomnia     Past Surgical History:  Procedure Laterality Date  . CORONARY ARTERY BYPASS GRAFT  03/21/05   off pump LIMA-LAD  . myomectomy  1981  . ROTATOR CUFF REPAIR  1998  . SHOULDER ARTHROSCOPY  2006  . TOTAL ABDOMINAL HYSTERECTOMY  1991  . trach    . VESICOVAGINAL FISTULA CLOSURE W/ TAH      Current Medications: Outpatient Medications Prior to Visit  Medication Sig Dispense Refill  . Acetaminophen (TYLENOL ARTHRITIS PAIN PO) Take 4 tablets by mouth as needed (ARTHRITIS PAIN).     Marland Kitchen celecoxib (CELEBREX) 200 MG capsule Take 200 mg by mouth daily.    Marland Kitchen FLUoxetine (PROZAC) 20 MG tablet Take 20 mg by mouth daily.    . modafinil (PROVIGIL) 100 MG tablet Take 1 tablet (100 mg total) by mouth daily. 30 tablet 5   No facility-administered medications prior to visit.      Allergies:   Codeine; Meperidine hcl; Morphine; Penicillins; and Pentazocine lactate   Social History   Social History  . Marital status: Divorced  Spouse name: N/A  . Number of children: N/A  . Years of education: N/A   Social History Main Topics  . Smoking status: Never Smoker  . Smokeless tobacco: Never Used  . Alcohol use No  . Drug use: Unknown  . Sexual activity: Not Asked   Other Topics Concern  . None   Social History Narrative  . None     Family History:  The patient's   family history includes Lung cancer in her father; Stroke in her mother.   ROS:   Please see the history of present illness.    Review of Systems  Constitution: Negative.  HENT: Negative.   Eyes: Negative.   Cardiovascular: Negative.   Respiratory: Negative.   Hematologic/Lymphatic: Negative.   Musculoskeletal: Negative.  Negative for joint pain.  Gastrointestinal: Positive for diarrhea.  Genitourinary: Negative.   Neurological: Negative.    All other systems reviewed and are  negative.   PHYSICAL EXAM:   VS:  BP (!) 170/62   Pulse (!) 52   Ht 5' (1.524 m)   Wt 115 lb (52.2 kg)   BMI 22.46 kg/m   Physical Exam  GEN: Well nourished, well developed, in no acute distress  Neck: no JVD, carotid bruits, or masses Cardiac:RRR; no murmurs, rubs, or gallops  Respiratory:  clear to auscultation bilaterally, normal work of breathing GI: soft, nontender, nondistended, + BS Ext: without cyanosis, clubbing, or edema, Good distal pulses bilaterally MS: no deformity or atrophy  Skin: warm and dry, no rash Neuro:  Alert and Oriented x 3, Strength and sensation are intact Psych: euthymic mood, full affect  Wt Readings from Last 3 Encounters:  08/11/15 115 lb (52.2 kg)  10/03/13 114 lb (51.7 kg)  05/07/13 112 lb (50.8 kg)      Studies/Labs Reviewed:   EKG:  EKG is  ordered today.  The ekg ordered today demonstrates sinus bradycardia 56 bpm otherwise normal  Recent Labs: No results found for requested labs within last 8760 hours.   Lipid Panel No results found for: CHOL, TRIG, HDL, CHOLHDL, VLDL, LDLCALC, LDLDIRECT  Additional studies/ records that were reviewed today include:  Nuclear stress test 10/2012 Impression Exercise Capacity:  Excellent exercise capacity. BP Response:  Hypertensive blood pressure response. Clinical Symptoms:  There is dyspnea. ECG Impression:  No significant ST segment change suggestive of ischemia. Comparison with Prior Nuclear Study: No significant change from previous study in 2011   Overall Impression:  Normal stress nuclear study.   LV Wall Motion:  NL LV Function; NL Wall Motion; EF 86%     ASSESSMENT:    1. Hyperlipidemia   2. Atherosclerosis of native coronary artery of native heart without angina pectoris   3. Essential hypertension      PLAN:  In order of problems listed above:  Hyperlipidemia resume Crestor 20 mg once daily check fasting lipid panel and LFTs in 6 weeks  CAD without angina recommend  maintenance with aspirin 81 mg daily and resuming Crestor check labs  Essential hypertension blood pressures been quite high recently. She has tolerated Diovan in the past. We'll start Diovan 80 mg once daily. She is to get a blood pressure cuff and check her blood pressures at home. I will see her back in 2 weeks for blood pressure check. Check renal function. Follow-up with Dr. Claiborne Billings in 6 months.    Medication Adjustments/Labs and Tests Ordered: Current medicines are reviewed at length with the patient today.  Concerns regarding medicines are outlined  above.  Medication changes, Labs and Tests ordered today are listed in the Patient Instructions below. Patient Instructions  Medication Instructions:  Take aspirin 81mg  daily   Labwork: CMET/CBCd/TSH today  Your physician recommends that you return for a FASTING lipid profile/ liver profile in 2 months.    Testing/Procedures: none  Follow-Up: Your physician recommends that you schedule a follow-up appointment in: 2 weeks with Ermalinda Barrios, PA   Your physician wants you to follow-up in: 6 months with Dr Claiborne Billings. ((February 2018).  You will receive a reminder letter in the mail two months in advance. If you don't receive a letter, please call our office to schedule the follow-up appointment.   Any Other Special Instructions Will Be Listed Below (If Applicable).    Low-Sodium Eating Plan Sodium raises blood pressure and causes water to be held in the body. Getting less sodium from food will help lower your blood pressure, reduce any swelling, and protect your heart, liver, and kidneys. We get sodium by adding salt (sodium chloride) to food. Most of our sodium comes from canned, boxed, and frozen foods. Restaurant foods, fast foods, and pizza are also very high in sodium. Even if you take medicine to lower your blood pressure or to reduce fluid in your body, getting less sodium from your food is important. WHAT IS MY PLAN? Most people  should limit their sodium intake to 2,300 mg a day. Your health care provider recommends that you limit your sodium intake to __________ a day.  WHAT DO I NEED TO KNOW ABOUT THIS EATING PLAN? For the low-sodium eating plan, you will follow these general guidelines:  Choose foods with a % Daily Value for sodium of less than 5% (as listed on the food label).   Use salt-free seasonings or herbs instead of table salt or sea salt.   Check with your health care provider or pharmacist before using salt substitutes.   Eat fresh foods.  Eat more vegetables and fruits.  Limit canned vegetables. If you do use them, rinse them well to decrease the sodium.   Limit cheese to 1 oz (28 g) per day.   Eat lower-sodium products, often labeled as "lower sodium" or "no salt added."  Avoid foods that contain monosodium glutamate (MSG). MSG is sometimes added to [place] food and some canned foods.  Check food labels (Nutrition Facts labels) on foods to learn how much sodium is in one serving.  Eat more home-cooked food and less restaurant, buffet, and fast food.  When eating at a restaurant, ask that your food be prepared with less salt, or no salt if possible.  HOW DO I READ FOOD LABELS FOR SODIUM INFORMATION? The Nutrition Facts label lists the amount of sodium in one serving of the food. If you eat more than one serving, you must multiply the listed amount of sodium by the number of servings. Food labels may also identify foods as:  Sodium free--Less than 5 mg in a serving.  Very low sodium--35 mg or less in a serving.  Low sodium--140 mg or less in a serving.  Light in sodium--50% less sodium in a serving. For example, if a food that usually has 300 mg of sodium is changed to become light in sodium, it will have 150 mg of sodium.  Reduced sodium--25% less sodium in a serving. For example, if a food that usually has 400 mg of sodium is changed to reduced sodium, it will have 300 mg of  sodium. WHAT FOODS CAN  I EAT? Grains Low-sodium cereals, including oats, puffed wheat and rice, and shredded wheat cereals. Low-sodium crackers. Unsalted rice and pasta. Lower-sodium bread.  Vegetables Frozen or fresh vegetables. Low-sodium or reduced-sodium canned vegetables. Low-sodium or reduced-sodium tomato sauce and paste. Low-sodium or reduced-sodium tomato and vegetable juices.  Fruits Fresh, frozen, and canned fruit. Fruit juice.  Meat and Other Protein Products Low-sodium canned tuna and salmon. Fresh or frozen meat, poultry, seafood, and fish. Lamb. Unsalted nuts. Dried beans, peas, and lentils without added salt. Unsalted canned beans. Homemade soups without salt. Eggs.  Dairy Milk. Soy milk. Ricotta cheese. Low-sodium or reduced-sodium cheeses. Yogurt.  Condiments Fresh and dried herbs and spices. Salt-free seasonings. Onion and garlic powders. Low-sodium varieties of mustard and ketchup. Fresh or refrigerated horseradish. Lemon juice.  Fats and Oils Reduced-sodium salad dressings. Unsalted butter.  Other Unsalted popcorn and pretzels.  The items listed above may not be a complete list of recommended foods or beverages. Contact your dietitian for more options. WHAT FOODS ARE NOT RECOMMENDED? Grains Instant hot cereals. Bread stuffing, pancake, and biscuit mixes. Croutons. Seasoned rice or pasta mixes. Noodle soup cups. Boxed or frozen macaroni and cheese. Self-rising flour. Regular salted crackers. Vegetables Regular canned vegetables. Regular canned tomato sauce and paste. Regular tomato and vegetable juices. Frozen vegetables in sauces. Salted [place] fries. Olives. Angie Fava. Relishes. Sauerkraut. Salsa. Meat and Other Protein Products Salted, canned, smoked, spiced, or pickled meats, seafood, or fish. Bacon, ham, sausage, hot dogs, corned beef, chipped beef, and packaged luncheon meats. Salt pork. Jerky. Pickled herring. Anchovies, regular canned tuna, and  sardines. Salted nuts. Dairy Processed cheese and cheese spreads. Cheese curds. Blue cheese and cottage cheese. Buttermilk.  Condiments Onion and garlic salt, seasoned salt, table salt, and sea salt. Canned and packaged gravies. Worcestershire sauce. Tartar sauce. Barbecue sauce. Teriyaki sauce. Soy sauce, including reduced sodium. Steak sauce. Fish sauce. Oyster sauce. Cocktail sauce. Horseradish that you find on the shelf. Regular ketchup and mustard. Meat flavorings and tenderizers. Bouillon cubes. Hot sauce. Tabasco sauce. Marinades. Taco seasonings. Relishes. Fats and Oils Regular salad dressings. Salted butter. Margarine. Ghee. Bacon fat.  Other Potato and tortilla chips. Corn chips and puffs. Salted popcorn and pretzels. Canned or dried soups. Pizza. Frozen entrees and pot pies.  The items listed above may not be a complete list of foods and beverages to avoid. Contact your dietitian for more information.   This information is not intended to replace advice given to you by your health care provider. Make sure you discuss any questions you have with your health care provider.   Document Released: 06/18/2001 Document Revised: 01/17/2014 Document Reviewed: 10/31/2012 Elsevier Interactive Patient Education [employer].    If you need a refill on your cardiac medications before your next appointment, please call your pharmacy.      Sumner Boast, PA-C  08/11/2015 11:45 AM    [facility] [address], [place], [place]  [address] Phone: [phone]; Fax: [phone]

## 2015-08-11 NOTE — Patient Instructions (Signed)
Medication Instructions:  Take aspirin 81mg  daily   Labwork: CMET/CBCd/TSH today  Your physician recommends that you return for a FASTING lipid profile/ liver profile in 2 months.    Testing/Procedures: none  Follow-Up: Your physician recommends that you schedule a follow-up appointment in: 2 weeks with Ermalinda Barrios, PA   Your physician wants you to follow-up in: 6 months with Dr Claiborne Billings. ((February 2018).  You will receive a reminder letter in the mail two months in advance. If you don't receive a letter, please call our office to schedule the follow-up appointment.   Any Other Special Instructions Will Be Listed Below (If Applicable).    Low-Sodium Eating Plan Sodium raises blood pressure and causes water to be held in the body. Getting less sodium from food will help lower your blood pressure, reduce any swelling, and protect your heart, liver, and kidneys. We get sodium by adding salt (sodium chloride) to food. Most of our sodium comes from canned, boxed, and frozen foods. Restaurant foods, fast foods, and pizza are also very high in sodium. Even if you take medicine to lower your blood pressure or to reduce fluid in your body, getting less sodium from your food is important. WHAT IS MY PLAN? Most people should limit their sodium intake to 2,300 mg a day. Your health care provider recommends that you limit your sodium intake to __________ a day.  WHAT DO I NEED TO KNOW ABOUT THIS EATING PLAN? For the low-sodium eating plan, you will follow these general guidelines:  Choose foods with a % Daily Value for sodium of less than 5% (as listed on the food label).   Use salt-free seasonings or herbs instead of table salt or sea salt.   Check with your health care provider or pharmacist before using salt substitutes.   Eat fresh foods.  Eat more vegetables and fruits.  Limit canned vegetables. If you do use them, rinse them well to decrease the sodium.   Limit cheese to 1 oz  (28 g) per day.   Eat lower-sodium products, often labeled as "lower sodium" or "no salt added."  Avoid foods that contain monosodium glutamate (MSG). MSG is sometimes added to [place] food and some canned foods.  Check food labels (Nutrition Facts labels) on foods to learn how much sodium is in one serving.  Eat more home-cooked food and less restaurant, buffet, and fast food.  When eating at a restaurant, ask that your food be prepared with less salt, or no salt if possible.  HOW DO I READ FOOD LABELS FOR SODIUM INFORMATION? The Nutrition Facts label lists the amount of sodium in one serving of the food. If you eat more than one serving, you must multiply the listed amount of sodium by the number of servings. Food labels may also identify foods as:  Sodium free--Less than 5 mg in a serving.  Very low sodium--35 mg or less in a serving.  Low sodium--140 mg or less in a serving.  Light in sodium--50% less sodium in a serving. For example, if a food that usually has 300 mg of sodium is changed to become light in sodium, it will have 150 mg of sodium.  Reduced sodium--25% less sodium in a serving. For example, if a food that usually has 400 mg of sodium is changed to reduced sodium, it will have 300 mg of sodium. WHAT FOODS CAN I EAT? Grains Low-sodium cereals, including oats, puffed wheat and rice, and shredded wheat cereals. Low-sodium crackers. Unsalted rice and  pasta. Lower-sodium bread.  Vegetables Frozen or fresh vegetables. Low-sodium or reduced-sodium canned vegetables. Low-sodium or reduced-sodium tomato sauce and paste. Low-sodium or reduced-sodium tomato and vegetable juices.  Fruits Fresh, frozen, and canned fruit. Fruit juice.  Meat and Other Protein Products Low-sodium canned tuna and salmon. Fresh or frozen meat, poultry, seafood, and fish. Lamb. Unsalted nuts. Dried beans, peas, and lentils without added salt. Unsalted canned beans. Homemade soups without salt.  Eggs.  Dairy Milk. Soy milk. Ricotta cheese. Low-sodium or reduced-sodium cheeses. Yogurt.  Condiments Fresh and dried herbs and spices. Salt-free seasonings. Onion and garlic powders. Low-sodium varieties of mustard and ketchup. Fresh or refrigerated horseradish. Lemon juice.  Fats and Oils Reduced-sodium salad dressings. Unsalted butter.  Other Unsalted popcorn and pretzels.  The items listed above may not be a complete list of recommended foods or beverages. Contact your dietitian for more options. WHAT FOODS ARE NOT RECOMMENDED? Grains Instant hot cereals. Bread stuffing, pancake, and biscuit mixes. Croutons. Seasoned rice or pasta mixes. Noodle soup cups. Boxed or frozen macaroni and cheese. Self-rising flour. Regular salted crackers. Vegetables Regular canned vegetables. Regular canned tomato sauce and paste. Regular tomato and vegetable juices. Frozen vegetables in sauces. Salted [place] fries. Olives. Angie Fava. Relishes. Sauerkraut. Salsa. Meat and Other Protein Products Salted, canned, smoked, spiced, or pickled meats, seafood, or fish. Bacon, ham, sausage, hot dogs, corned beef, chipped beef, and packaged luncheon meats. Salt pork. Jerky. Pickled herring. Anchovies, regular canned tuna, and sardines. Salted nuts. Dairy Processed cheese and cheese spreads. Cheese curds. Blue cheese and cottage cheese. Buttermilk.  Condiments Onion and garlic salt, seasoned salt, table salt, and sea salt. Canned and packaged gravies. Worcestershire sauce. Tartar sauce. Barbecue sauce. Teriyaki sauce. Soy sauce, including reduced sodium. Steak sauce. Fish sauce. Oyster sauce. Cocktail sauce. Horseradish that you find on the shelf. Regular ketchup and mustard. Meat flavorings and tenderizers. Bouillon cubes. Hot sauce. Tabasco sauce. Marinades. Taco seasonings. Relishes. Fats and Oils Regular salad dressings. Salted butter. Margarine. Ghee. Bacon fat.  Other Potato and tortilla chips. Corn  chips and puffs. Salted popcorn and pretzels. Canned or dried soups. Pizza. Frozen entrees and pot pies.  The items listed above may not be a complete list of foods and beverages to avoid. Contact your dietitian for more information.   This information is not intended to replace advice given to you by your health care provider. Make sure you discuss any questions you have with your health care provider.   Document Released: 06/18/2001 Document Revised: 01/17/2014 Document Reviewed: 10/31/2012 Elsevier Interactive Patient Education [employer].    If you need a refill on your cardiac medications before your next appointment, please call your pharmacy.

## 2015-08-12 LAB — COMPREHENSIVE METABOLIC PANEL
ALK PHOS: 91 U/L (ref 33–130)
ALT: 12 U/L (ref 6–29)
AST: 21 U/L (ref 10–35)
Albumin: 4.2 g/dL (ref 3.6–5.1)
BUN: 16 mg/dL (ref 7–25)
CO2: 27 mmol/L (ref 20–31)
Calcium: 9.4 mg/dL (ref 8.6–10.4)
Chloride: 105 mmol/L (ref 98–110)
Creat: 0.7 mg/dL (ref 0.50–0.99)
GLUCOSE: 82 mg/dL (ref 65–99)
Potassium: 4.7 mmol/L (ref 3.5–5.3)
SODIUM: 143 mmol/L (ref 135–146)
Total Bilirubin: 0.5 mg/dL (ref 0.2–1.2)
Total Protein: 6.4 g/dL (ref 6.1–8.1)

## 2015-08-12 LAB — TSH: TSH: 3 mIU/L

## 2015-08-31 ENCOUNTER — Ambulatory Visit: Payer: Medicare Other | Admitting: Physician Assistant

## 2015-09-03 ENCOUNTER — Ambulatory Visit (INDEPENDENT_AMBULATORY_CARE_PROVIDER_SITE_OTHER): Payer: Medicare Other | Admitting: Physician Assistant

## 2015-09-03 ENCOUNTER — Encounter: Payer: Self-pay | Admitting: Physician Assistant

## 2015-09-03 DIAGNOSIS — I2510 Atherosclerotic heart disease of native coronary artery without angina pectoris: Secondary | ICD-10-CM

## 2015-09-03 DIAGNOSIS — I10 Essential (primary) hypertension: Secondary | ICD-10-CM | POA: Diagnosis not present

## 2015-09-03 DIAGNOSIS — E785 Hyperlipidemia, unspecified: Secondary | ICD-10-CM | POA: Diagnosis not present

## 2015-09-03 MED ORDER — VALSARTAN 160 MG PO TABS: 160.0000 mg | ORAL_TABLET | Freq: Every day | ORAL | 2 refills | Status: DC

## 2015-09-03 NOTE — Progress Notes (Signed)
Cardiology Office Note    Date:  09/03/2015   ID:  Brandy [name], Brandy [name] 11-14-1950, MRN [id number]  PCP:  No PCP Per Patient  Cardiologist: Dr. Claiborne Billings  Chief Complaint  Patient presents with  . Follow-up    2 weeks    History of Present Illness:  Brandy [name] is a 65 y.o. female with history of CAD dating back to 2007.                                                        She was not having significant symptoms but because of family history for peripheral vascular disease, abdominal aortic aneurysm, and somewhat labile blood pressure a Cardiolite study was done which suggested anterior wall ischemia. Subsequent cardiac catheterization revealing 95% ostial LAD stenosis. She underwent off pump LIMA to LAD bypass surgery by Dr. Roxan Hockey on 03/21/2005. She had initially been followed by Dr. Duke Salvia.  Dr. Claiborne Billings saw her in 2011 area an echo Doppler study in April 2001 revealed an ejection fraction greater than 55%. There was grade 1 diastolic dysfunction. She had mild MR and nuclear perfusion scan was normal.   He then saw her in 2014 after a three-year absence and stopping all her medications. He ordered a nuclear stress test that was normal. She started aspirin.  I saw her 2 weeks ago after she stopped her Diovan and blood pressure was 171/100 stopped her aspirin and used to be on Crestor for cholesterol but stopped this as well.   I restarted Diovan 80 mg once daily and she is back today for follow-up. She bought a blood pressure cuff and brought her readings in from home. They're still high at times. She takes her Diovan at night. Blood pressures in the morning are about 152/85 and in the evening 170-187/93. She has had some lower readings. She thinks she follows a low-sodium diet for the most part.         Past Medical History:  Diagnosis Date  . Bipolar 1 disorder ([facility])   . CAD (coronary artery disease) 2007   CABG X 1  . Hyperlipidemia   . Hypertension   .  Idiopathic hypersomnia     Past Surgical History:  Procedure Laterality Date  . CORONARY ARTERY BYPASS GRAFT  03/21/05   off pump LIMA-LAD  . myomectomy  1981  . ROTATOR CUFF REPAIR  1998  . SHOULDER ARTHROSCOPY  2006  . TOTAL ABDOMINAL HYSTERECTOMY  1991  . trach    . VESICOVAGINAL FISTULA CLOSURE W/ TAH      Current Medications: Outpatient Medications Prior to Visit  Medication Sig Dispense Refill  . Acetaminophen (TYLENOL ARTHRITIS PAIN PO) Take 4 tablets by mouth as needed (ARTHRITIS PAIN).     Marland Kitchen aspirin EC 81 MG tablet Take 1 tablet (81 mg total) by mouth daily.    . Calcium Carb-Cholecalciferol (CALCIUM 500+D) 500-400 MG-UNIT TABS Take 2 tablets by mouth daily.    . celecoxib (CELEBREX) 200 MG capsule Take 200 mg by mouth daily.    Marland Kitchen FLUoxetine (PROZAC) 20 MG tablet Take 20 mg by mouth daily.    . Multiple Vitamins-Minerals (MULTIPLE VITAMINS/WOMENS) tablet Take 1 tablet by mouth daily.    . rosuvastatin (CRESTOR) 20 MG tablet Take 1 tablet (20 mg total) by mouth daily. 90 tablet  0  . valsartan (DIOVAN) 80 MG tablet Take 1 tablet (80 mg total) by mouth daily. 90 tablet 0  . modafinil (PROVIGIL) 100 MG tablet Take 1 tablet (100 mg total) by mouth daily. 30 tablet 5   No facility-administered medications prior to visit.      Allergies:   Codeine; Meperidine hcl; Morphine; Penicillins; and Pentazocine lactate   Social History   Social History  . Marital status: Divorced    Spouse name: N/A  . Number of children: N/A  . Years of education: N/A   Social History Main Topics  . Smoking status: Never Smoker  . Smokeless tobacco: Never Used  . Alcohol use No  . Drug use: Unknown  . Sexual activity: Not Asked   Other Topics Concern  . None   Social History Narrative  . None     Family History:  The patient's   family history includes Lung cancer in her father; Stroke in her mother.   ROS:   Please see the history of present illness.    Review of Systems    Constitution: Negative.  HENT: Negative.   Eyes: Negative.   Cardiovascular: Negative.   Respiratory: Negative.   Hematologic/Lymphatic: Negative.   Musculoskeletal: Negative.  Negative for joint pain.  Gastrointestinal: Negative.   Genitourinary: Negative.   Neurological: Negative.    All other systems reviewed and are negative.   PHYSICAL EXAM:   VS:  BP (!) 152/85   Pulse 68   Ht 5' 0.5" (1.537 m)   Wt 118 lb 6.4 oz (53.7 kg)   BMI 22.74 kg/m   Physical Exam  GEN: Well nourished, well developed, in no acute distress Neck: no JVD, carotid bruits, or masses Cardiac:RRR; no murmurs, rubs, or gallops  Respiratory:  clear to auscultation bilaterally, normal work of breathing GI: soft, nontender, nondistended, + BS Ext: without cyanosis, clubbing, or edema, Good distal pulses bilaterally MS: no deformity or atrophy Skin: warm and dry, no rash Psych: euthymic mood, full affect  Wt Readings from Last 3 Encounters:  09/03/15 118 lb 6.4 oz (53.7 kg)  08/11/15 115 lb (52.2 kg)  10/03/13 114 lb (51.7 kg)      Studies/Labs Reviewed:   EKG:  EKG is not ordered today.     Recent Labs: 08/11/2015: ALT 12; BUN 16; Creat 0.70; Hemoglobin 12.8; Platelets 272; Potassium 4.7; Sodium 143; TSH 3.00   Lipid Panel No results found for: CHOL, TRIG, HDL, CHOLHDL, VLDL, LDLCALC, LDLDIRECT  Additional studies/ records that were reviewed today include:   Nuclear stress test 10/2012 Impression Exercise Capacity:  Excellent exercise capacity. BP Response:  Hypertensive blood pressure response. Clinical Symptoms:  There is dyspnea. ECG Impression:  No significant ST segment change suggestive of ischemia. Comparison with Prior Nuclear Study: No significant change from previous study in 2011   Overall Impression:  Normal stress nuclear study.   LV Wall Motion:  NL LV Function; NL Wall Motion; EF 86%       ASSESSMENT:    1. Essential hypertension   2. Hyperlipidemia   3.  Atherosclerosis of native coronary artery of native heart without angina pectoris      PLAN:  In order of problems listed above:  Essential hypertension: Blood pressure still up. Will increase Diovan to 160 mg daily. She will check her blood pressures and call us in 1 week with results. 2 g sodium diet. Follow-up with Dr. Claiborne Billings in 3-4 months  Hyperlipidemia on Crestor check fasting lipid panels  in 6 weeks  CAD stable without angina    Medication Adjustments/Labs and Tests Ordered: Current medicines are reviewed at length with the patient today.  Concerns regarding medicines are outlined above.  Medication changes, Labs and Tests ordered today are listed in the Patient Instructions below. There are no Patient Instructions on file for this visit.   Sumner Boast, PA-C  09/03/2015 10:06 AM    [facility] [place], [place], [place]  [address] Phone: [phone]; Fax: [phone]

## 2015-09-03 NOTE — Patient Instructions (Addendum)
Medication Instructions:  Your physician has recommended you make the following change in your medication:  1. Increase diovan (160 mg ) daily, sent in today # 90 at [facility] on [address].    Labwork: -None  Testing/Procedures: -None  Follow-Up: Your physician recommends that you keep your scheduled follow-up appointment with Dr. Claiborne Billings.   Any Other Special Instructions Will Be Listed Below (If Applicable).  Call our office in one week with blood pressure readings.   If you need a refill on your cardiac medications before your next appointment, please call your pharmacy.

## 2015-09-17 DIAGNOSIS — M1612 Unilateral primary osteoarthritis, left hip: Secondary | ICD-10-CM | POA: Diagnosis not present

## 2015-09-23 DIAGNOSIS — F322 Major depressive disorder, single episode, severe without psychotic features: Secondary | ICD-10-CM | POA: Diagnosis not present

## 2015-10-12 ENCOUNTER — Other Ambulatory Visit: Payer: Medicare Other

## 2015-10-13 ENCOUNTER — Other Ambulatory Visit: Payer: Medicare Other

## 2015-11-04 DIAGNOSIS — M1612 Unilateral primary osteoarthritis, left hip: Secondary | ICD-10-CM | POA: Diagnosis not present

## 2015-11-24 ENCOUNTER — Encounter: Payer: Self-pay | Admitting: Cardiovascular Disease

## 2015-11-24 ENCOUNTER — Ambulatory Visit (INDEPENDENT_AMBULATORY_CARE_PROVIDER_SITE_OTHER): Payer: Medicare Other | Admitting: Cardiovascular Disease

## 2015-11-24 VITALS — BP 136/72 | HR 67 | Ht 60.5 in | Wt 114.4 lb

## 2015-11-24 DIAGNOSIS — E784 Other hyperlipidemia: Secondary | ICD-10-CM

## 2015-11-24 DIAGNOSIS — I2583 Coronary atherosclerosis due to lipid rich plaque: Secondary | ICD-10-CM

## 2015-11-24 DIAGNOSIS — I2510 Atherosclerotic heart disease of native coronary artery without angina pectoris: Secondary | ICD-10-CM

## 2015-11-24 DIAGNOSIS — I10 Essential (primary) hypertension: Secondary | ICD-10-CM | POA: Diagnosis not present

## 2015-11-24 DIAGNOSIS — E785 Hyperlipidemia, unspecified: Secondary | ICD-10-CM

## 2015-11-24 DIAGNOSIS — G4711 Idiopathic hypersomnia with long sleep time: Secondary | ICD-10-CM | POA: Diagnosis not present

## 2015-11-24 DIAGNOSIS — E7849 Other hyperlipidemia: Secondary | ICD-10-CM

## 2015-11-24 NOTE — Patient Instructions (Signed)
Your physician has requested that you have an echocardiogram. Echocardiography is a painless test that uses sound waves to create images of your heart. It provides your doctor with information about the size and shape of your heart and how well your heart's chambers and valves are working. This procedure takes approximately one hour. There are no restrictions for this procedure.  Your physician has requested that you have a lexiscan myoview. For further information please visit [email or web address]. Please follow instruction sheet, as given.  Your physician recommends that you return for lab work FASTING.  Your physician recommends that you schedule a follow-up appointment in: 6 weeks.

## 2015-11-26 DIAGNOSIS — M1612 Unilateral primary osteoarthritis, left hip: Secondary | ICD-10-CM | POA: Diagnosis not present

## 2015-11-30 NOTE — Progress Notes (Signed)
Patient ID: Brandy [name], female   DOB: [birth year], 65 y.o.   MRN: [id number]             HPI: Brandy [name], is a 65 y.o. female who presents to the office for a 38 month  follow-up cardiology evaluation.  I last saw her in September 2014.  Brandy [name] is a 65 years old white female who in 2007  was not having significant symptoms but because of family history for peripheral vascular disease, abdominal aortic aneurysm, and somewhat labile blood pressure a Cardiolite study was done which suggested anterior wall ischemia. Subsequent cardiac catheterization revealing 95% ostial LAD stenosis. She underwent off pump LIMA to LAD bypass surgery by Brandy [name] on 03/21/2005. She had initially been followed by [name]. I saw her in 2011 area an echo Doppler study in April 2001 revealed an ejection fraction greater than 55%. There was grade 1 diastolic dysfunction. She had mild MR and nuclear perfusion scan was normal.  She tells me that she had stopped taking medication for blood pressure.  She recently saw [name] and her blood pressure was significantly elevated at 169 or 100.  She resumed taking Diovan and recently has been taking this at 160 mg daily.  She has a history of hyperlipidemia and has been taking Crestor 20 mg.  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  She has developed left hip discomfort and needs surgery.  She also has a history of idiopathic hypersomnolence, and has been taking Provigil 200 mg daily for this.  She presents for cardiology evaluation.  Past Medical History:  Diagnosis Date  . Bipolar 1 disorder ([facility])   . CAD (coronary artery disease) 2007   CABG X 1  . Hyperlipidemia   . Hypertension   . Idiopathic hypersomnia     Past Surgical History:  Procedure Laterality Date  . CORONARY ARTERY BYPASS GRAFT  03/21/05   off pump LIMA-LAD  . myomectomy  1981  . ROTATOR CUFF REPAIR  1998  .  SHOULDER ARTHROSCOPY  2006  . TOTAL ABDOMINAL HYSTERECTOMY  1991  . trach    . VESICOVAGINAL FISTULA CLOSURE W/ TAH      Allergies  Allergen Reactions  . Codeine   . Meperidine Hcl   . Morphine   . Penicillins   . Pentazocine Lactate     Current Outpatient Prescriptions  Medication Sig Dispense Refill  . Acetaminophen (TYLENOL ARTHRITIS PAIN PO) Take 4 tablets by mouth as needed (ARTHRITIS PAIN).     Marland Kitchen aspirin EC 81 MG tablet Take 1 tablet (81 mg total) by mouth daily.    . Calcium Carb-Cholecalciferol (CALCIUM 500+D) 500-400 MG-UNIT TABS Take 2 tablets by mouth daily.    . celecoxib (CELEBREX) 200 MG capsule Take 200 mg by mouth daily.    Marland Kitchen FLUoxetine (PROZAC) 20 MG tablet Take 20 mg by mouth daily.    . Multiple Vitamins-Minerals (MULTIPLE VITAMINS/WOMENS) tablet Take 1 tablet by mouth daily.    . valsartan (DIOVAN) 160 MG tablet Take 1 tablet (160 mg total) by mouth daily. 90 tablet 2  . rosuvastatin (CRESTOR) 20 MG tablet Take 1 tablet (20 mg total) by mouth daily. 90 tablet 0   No current facility-administered medications for this visit.     Social History   Social History  . Marital status: Divorced    Spouse name: N/A  . Number of children:  N/A  . Years of education: N/A   Occupational History  . Not on file.   Social History Main Topics  . Smoking status: Never Smoker  . Smokeless tobacco: Never Used  . Alcohol use No  . Drug use: Unknown  . Sexual activity: Not on file   Other Topics Concern  . Not on file   Social History Narrative  . No narrative on file    Family History  Problem Relation Age of Onset  . Stroke Mother     died 64  . Lung cancer Father     died 50  . Bipolar disorder    . Hypertension     Socially she is divorced for >25 years. She is now retired from [name].  She does not have any children. There is no tobacco use. No alcohol use. She does exercise at least 3 days per week  ROS General: Negative; No  fevers, chills, or night sweats;  HEENT: Negative; No changes in vision or hearing, sinus congestion, difficulty swallowing Pulmonary: Negative; No cough, wheezing, shortness of breath, hemoptysis Cardiovascular: Negative; No chest pain, presyncope, syncope, palpitations GI: Negative; No nausea, vomiting, diarrhea, or abdominal pain GU: Negative; No dysuria, hematuria, or difficulty voiding Musculoskeletal: Positive for left hip degeneration Hematologic/Oncology: Negative; no easy bruising, bleeding Endocrine: Negative; no heat/cold intolerance; no diabetes Neuro: Negative; no changes in balance, headaches Skin: Negative; No rashes or skin lesions Psychiatric: Positive for bipolar disorder felt to be atypical. Sleep: Negative; No snoring, daytime sleepiness, hypersomnolence, bruxism, restless legs, hypnogognic hallucinations, no cataplexy Other comprehensive 14 point system review is negative.   PE BP 136/72   Pulse 67   Ht 5' 0.5" (1.537 m)   Wt 114 lb 6.4 oz (51.9 kg)   BMI 21.97 kg/m    Repeat blood pressure by me 136/70.  Wt Readings from Last 3 Encounters:  11/24/15 114 lb 6.4 oz (51.9 kg)  09/03/15 118 lb 6.4 oz (53.7 kg)  08/11/15 115 lb (52.2 kg)   General: Alert, oriented, no distress.  Skin: normal turgor, no rashes HEENT: Normocephalic, atraumatic. Pupils round and reactive; sclera anicteric;no lid lag.  Nose without nasal septal hypertrophy Mouth/Parynx benign; Mallinpatti scale 2 Neck: No JVD, no carotid briuts Lungs: clear to ausculatation and percussion; no wheezing or rales Chest wall: Nontender to palpation Heart: RRR, s1 s2 normal ; 2//6 systolic murmur; no S3 gallop.  No rubs thrills or heaves. Abdomen: soft, nontender; no hepatosplenomehaly, BS+; abdominal aorta nontender and not dilated by palpation. Pulses 2+ Back: No CVA Extremities: no clubbing cyanosis or edema, Homan's sign negative  Neurologic: grossly nonfocal Psychologic: Normal affect and  mood  ECG (independently read by me): Normal sinus rhythm at 67 bpm.  Normal intervals.  No significant ST-T changes.  November 2014 ECG: Sinus rhythm at 64 beats per minute. QTc interval 412 Brandy. Agnostic Q-wave in lead III and F  LABS:  BMP Latest Ref Rng & Units 08/11/2015 02/25/2010  Glucose 65 - 99 mg/dL 82 94  BUN 7 - 25 mg/dL 16 18  Creatinine 0.50 - 0.99 mg/dL 0.70 0.7  Sodium 135 - 146 mmol/L 143 144  Potassium 3.5 - 5.3 mmol/L 4.7 4.1  Chloride 98 - 110 mmol/L 105 106  CO2 20 - 31 mmol/L 27 25  Calcium 8.6 - 10.4 mg/dL 9.4 9.6   Hepatic Function Latest Ref Rng & Units 08/11/2015  Total Protein 6.1 - 8.1 g/dL 6.4  Albumin 3.6 - 5.1 g/dL 4.2  AST 10 - 35 U/L 21  ALT 6 - 29 U/L 12  Alk Phosphatase 33 - 130 U/L 91  Total Bilirubin 0.2 - 1.2 mg/dL 0.5   CBC Latest Ref Rng & Units 08/11/2015 02/25/2010  WBC 3.8 - 10.8 K/uL 6.4 8.0  Hemoglobin 11.7 - 15.5 g/dL 12.8 14.3  Hematocrit 35.0 - 45.0 % 38.8 41.9  Platelets 140 - 400 K/uL 272 262   Lab Results  Component Value Date   MCV 91.1 08/11/2015   MCV 85.5 02/25/2010   Lab Results  Component Value Date   TSH 3.00 08/11/2015   Lipid Panel  No results found for: CHOL, TRIG, HDL, CHOLHDL, VLDL, LDLCALC, LDLDIRECT  RADIOLOGY: No results found.    ASSESSMENT AND PLAN: Brandy Yokley is a 65 year old Caucasian female who underwent CABG revascularization surgery in 2007 after cardiac catheterization revealed 95% ostial LAD stenosis encroaching upon the left main. She underwent off-pump LIMA to LAD bypass. Prior to her surgery, she was completely asymptomatic and anterior ischemia was noted routinely on Cardiolite imaging.  She has history of hypertension and had been on valsartan, but she at times stopped taking this medication.  There also is a history of hyperlipidemia for which he has been taking rosuvastatin.  Office valsartan, her blood pressure had significantly increased and she now has been back on valsartan at 160 mg daily.   She does have a 2/6 systolic murmur along the left sternal border.  She is in need for left hip surgery.  I have recommended that she undergo a 2-D echo Doppler study to reassess systolic and diastolic function.  I'm also scheduling her for [place] study to assess for potential ischemia.  In the past.  She was asymptomatic when she was found to have an abnormal nuclear study, which led to her ultimate catheterization and bypass revascularization.  She has a history of atypical bipolar disorder for which she takes Prozac.  She has been taking Provigil for idiopathic hypersomnolence.  I will see her in 6 weeks for follow up evaluation and further recommendations will be made at that time.   Troy Sine, MD, [facility]  11/30/2015 6:06 PM

## 2015-12-01 ENCOUNTER — Telehealth (HOSPITAL_COMMUNITY): Payer: Self-pay

## 2015-12-01 NOTE — Telephone Encounter (Signed)
Encounter complete. 

## 2015-12-05 ENCOUNTER — Other Ambulatory Visit: Payer: Self-pay | Admitting: Physician Assistant

## 2015-12-05 DIAGNOSIS — E785 Hyperlipidemia, unspecified: Secondary | ICD-10-CM

## 2015-12-05 DIAGNOSIS — I10 Essential (primary) hypertension: Secondary | ICD-10-CM

## 2015-12-05 DIAGNOSIS — I2510 Atherosclerotic heart disease of native coronary artery without angina pectoris: Secondary | ICD-10-CM

## 2015-12-07 NOTE — Telephone Encounter (Signed)
Rx has been sent to the pharmacy electronically. ° °

## 2015-12-08 ENCOUNTER — Ambulatory Visit (HOSPITAL_COMMUNITY)
Admission: RE | Admit: 2015-12-08 | Discharge: 2015-12-08 | Disposition: A | Discharge status: Home / Self Care | Payer: Medicare Other | Source: Ambulatory Visit | Attending: Cardiovascular Disease | Admitting: Cardiovascular Disease

## 2015-12-08 ENCOUNTER — Encounter (HOSPITAL_COMMUNITY): Payer: Self-pay

## 2015-12-08 DIAGNOSIS — I2583 Coronary atherosclerosis due to lipid rich plaque: Secondary | ICD-10-CM | POA: Insufficient documentation

## 2015-12-08 DIAGNOSIS — I2510 Atherosclerotic heart disease of native coronary artery without angina pectoris: Secondary | ICD-10-CM | POA: Diagnosis not present

## 2015-12-08 DIAGNOSIS — R5383 Other fatigue: Secondary | ICD-10-CM | POA: Insufficient documentation

## 2015-12-08 DIAGNOSIS — I252 Old myocardial infarction: Secondary | ICD-10-CM | POA: Insufficient documentation

## 2015-12-08 DIAGNOSIS — G4711 Idiopathic hypersomnia with long sleep time: Secondary | ICD-10-CM | POA: Insufficient documentation

## 2015-12-08 DIAGNOSIS — I10 Essential (primary) hypertension: Secondary | ICD-10-CM | POA: Insufficient documentation

## 2015-12-08 LAB — MYOCARDIAL PERFUSION IMAGING
CHL CUP NUCLEAR SDS: 1
CHL CUP NUCLEAR SRS: 1
CHL CUP NUCLEAR SSS: 2
LV dias vol: 51 mL (ref 46–106)
LVSYSVOL: 16 mL
NUC STRESS TID: 1.23
Peak HR: 85 {beats}/min
Rest HR: 56 {beats}/min

## 2015-12-08 MED ORDER — REGADENOSON 0.4 MG/5ML IV SOLN
0.4000 mg | Freq: Once | INTRAVENOUS | Status: AC
  Administered 2015-12-08: 0.4 mg via INTRAVENOUS

## 2015-12-08 MED ORDER — TECHNETIUM TC 99M TETROFOSMIN IV KIT
9.8000 | PACK | Freq: Once | INTRAVENOUS | Status: AC | PRN
Start: 2015-12-08 — End: 2015-12-08
  Administered 2015-12-08: 9.8 via INTRAVENOUS
  Filled 2015-12-08: qty 10

## 2015-12-08 MED ORDER — TECHNETIUM TC 99M TETROFOSMIN IV KIT
29.7000 | PACK | Freq: Once | INTRAVENOUS | Status: AC | PRN
  Administered 2015-12-08: 29.7 via INTRAVENOUS
  Filled 2015-12-08: qty 30

## 2015-12-10 DIAGNOSIS — Z1211 Encounter for screening for malignant neoplasm of colon: Secondary | ICD-10-CM | POA: Diagnosis not present

## 2015-12-17 ENCOUNTER — Other Ambulatory Visit (HOSPITAL_COMMUNITY): Payer: Medicare Other

## 2015-12-23 DIAGNOSIS — M1612 Unilateral primary osteoarthritis, left hip: Secondary | ICD-10-CM | POA: Diagnosis not present

## 2015-12-23 DIAGNOSIS — G8929 Other chronic pain: Secondary | ICD-10-CM | POA: Diagnosis not present

## 2015-12-23 DIAGNOSIS — M25552 Pain in left hip: Secondary | ICD-10-CM | POA: Diagnosis not present

## 2015-12-24 ENCOUNTER — Encounter: Payer: Self-pay | Admitting: *Deleted

## 2016-01-14 ENCOUNTER — Ambulatory Visit (HOSPITAL_COMMUNITY): Payer: Medicare Other | Attending: Cardiovascular Disease

## 2016-01-14 ENCOUNTER — Other Ambulatory Visit: Payer: Self-pay

## 2016-01-14 DIAGNOSIS — I2510 Atherosclerotic heart disease of native coronary artery without angina pectoris: Secondary | ICD-10-CM | POA: Diagnosis not present

## 2016-01-14 DIAGNOSIS — I361 Nonrheumatic tricuspid (valve) insufficiency: Secondary | ICD-10-CM | POA: Insufficient documentation

## 2016-01-14 DIAGNOSIS — I2583 Coronary atherosclerosis due to lipid rich plaque: Secondary | ICD-10-CM | POA: Insufficient documentation

## 2016-01-14 DIAGNOSIS — I340 Nonrheumatic mitral (valve) insufficiency: Secondary | ICD-10-CM | POA: Diagnosis not present

## 2016-01-14 DIAGNOSIS — I501 Left ventricular failure: Secondary | ICD-10-CM | POA: Insufficient documentation

## 2016-01-14 DIAGNOSIS — I10 Essential (primary) hypertension: Secondary | ICD-10-CM | POA: Insufficient documentation

## 2016-01-18 ENCOUNTER — Ambulatory Visit (INDEPENDENT_AMBULATORY_CARE_PROVIDER_SITE_OTHER): Payer: Medicare Other | Admitting: Cardiovascular Disease

## 2016-01-18 ENCOUNTER — Encounter: Payer: Self-pay | Admitting: Cardiovascular Disease

## 2016-01-18 VITALS — BP 142/76 | HR 75 | Ht 60.0 in | Wt 114.0 lb

## 2016-01-18 DIAGNOSIS — Z01818 Encounter for other preprocedural examination: Secondary | ICD-10-CM

## 2016-01-18 DIAGNOSIS — R011 Cardiac murmur, unspecified: Secondary | ICD-10-CM

## 2016-01-18 DIAGNOSIS — G4711 Idiopathic hypersomnia with long sleep time: Secondary | ICD-10-CM

## 2016-01-18 DIAGNOSIS — I2583 Coronary atherosclerosis due to lipid rich plaque: Secondary | ICD-10-CM

## 2016-01-18 DIAGNOSIS — E785 Hyperlipidemia, unspecified: Secondary | ICD-10-CM

## 2016-01-18 DIAGNOSIS — I10 Essential (primary) hypertension: Secondary | ICD-10-CM | POA: Diagnosis not present

## 2016-01-18 DIAGNOSIS — I2510 Atherosclerotic heart disease of native coronary artery without angina pectoris: Secondary | ICD-10-CM | POA: Diagnosis not present

## 2016-01-18 NOTE — Patient Instructions (Signed)
Your physician wants you to follow-up in: 1 year or sooner if needed. You will receive a reminder letter in the mail two months in advance. If you don't receive a letter, please call our office to schedule the follow-up appointment.   If you need a refill on your cardiac medications before your next appointment, please call your pharmacy.   

## 2016-01-18 NOTE — Progress Notes (Signed)
Patient ID: Brandy [name], female   DOB: [birth year], 66 y.o.   MRN: [id number]             HPI: Brandy [name], is a 66 y.o. female who presents to the office for a 2 month follow-up cardiology evaluation and preoperative cardiology clearance assessment.  Ms Brickhouse is a 66 years old white female who in 2007 was not having significant symptoms but because of family history for peripheral vascular disease, abdominal aortic aneurysm, and somewhat labile blood pressure a Cardiolite study was done which suggested anterior wall ischemia. Subsequent cardiac catheterization revealing 95% ostial LAD stenosis. She underwent off pump LIMA to LAD bypass surgery by Dr. Roxan Hockey on 03/21/2005. She had initially been followed by Dr. Duke Salvia. I saw her in 2011 area an echo Doppler study in April 2001 revealed an ejection fraction greater than 55%. There was grade 1 diastolic dysfunction. She had mild MR and nuclear perfusion scan was normal.  I had not seen her in over 3 years and she had stopped taking medication for blood pressure.  She recently saw Dr. Milta Deiters and her blood pressure was significantly elevated at 169/ 100.  She resumed taking Diovan and recently has been taking this at 160 mg daily.  She has a history of hyperlipidemia and has been taking Crestor 20 mg.  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 Charlott Holler of psychiatry and has been taking Prozac 20 mg.  She has developed left hip discomfort and needs surgery.  She also has a history of idiopathic hypersomnolence, and has been taking Provigil 200 mg daily for this.  When I saw her, her exam revealed a 2/6 systolic murmur along the left sternal border.  She was in need for left hip replacement surgery and is tentatively scheduled to undergo this surgery and for every 22,018.  I recommended that she undergo a [place] study for preoperative assessment and this was done on 12/08/2015 which revealed normal perfusion and  function.  A 2-D echo Doppler study which was done on 01/14/2016 showed an EF of 65-70%.  There was mild grade 1 diastolic dysfunction.  She had mitral annular calcification with trivial MR.  There was mild TR.  There was no evidence for aortic stenosis.  PA pressure was normal.  She presents for follow-up evaluation and preoperative clearance.  She denies any chest pain.  She denies palpitations.  She denies presyncope or syncope.  She denies PND, orthopnea.  Past Medical History:  Diagnosis Date  . Bipolar 1 disorder ([place])   . CAD (coronary artery disease) 2007   CABG X 1  . Hyperlipidemia   . Hypertension   . Idiopathic hypersomnia     Past Surgical History:  Procedure Laterality Date  . CORONARY ARTERY BYPASS GRAFT  03/21/05   off pump LIMA-LAD  . myomectomy  1981  . ROTATOR CUFF REPAIR  1998  . SHOULDER ARTHROSCOPY  2006  . TOTAL ABDOMINAL HYSTERECTOMY  1991  . trach    . VESICOVAGINAL FISTULA CLOSURE W/ TAH      Allergies  Allergen Reactions  . Codeine   . Meperidine Hcl   . Morphine   . Penicillins   . Pentazocine Lactate     Current Outpatient Prescriptions  Medication Sig Dispense Refill  . Acetaminophen (TYLENOL ARTHRITIS PAIN PO) Take 4 tablets by mouth as needed (ARTHRITIS PAIN).     Marland Kitchen aspirin EC 81 MG tablet Take 1 tablet (81 mg total) by  mouth daily.    . Calcium Carb-Cholecalciferol (CALCIUM 500+D) 500-400 MG-UNIT TABS Take 2 tablets by mouth daily.    . celecoxib (CELEBREX) 200 MG capsule Take 200 mg by mouth daily.    Marland Kitchen FLUoxetine (PROZAC) 20 MG tablet Take 20 mg by mouth daily.    . Multiple Vitamins-Minerals (MULTIPLE VITAMINS/WOMENS) tablet Take 1 tablet by mouth daily.    . rosuvastatin (CRESTOR) 20 MG tablet TAKE 1 TABLET (20 MG TOTAL) BY MOUTH DAILY. 90 tablet 2  . TRAMADOL HCL PO Take 2 tablets by mouth at bedtime.    . valsartan (DIOVAN) 160 MG tablet Take 1 tablet (160 mg total) by mouth daily. 90 tablet 2   No current facility-administered  medications for this visit.     Social History   Social History  . Marital status: Divorced    Spouse name: N/A  . Number of children: N/A  . Years of education: N/A   Occupational History  . Not on file.   Social History Main Topics  . Smoking status: Never Smoker  . Smokeless tobacco: Never Used  . Alcohol use No  . Drug use: Unknown  . Sexual activity: Not on file   Other Topics Concern  . Not on file   Social History Narrative  . No narrative on file    Family History  Problem Relation Age of Onset  . Stroke Mother     died 43  . Lung cancer Father     died 61  . Bipolar disorder    . Hypertension     Socially she is divorced for >25 years. She is now retired from [employer] teacher.  She does not have any children. There is no tobacco use. No alcohol use. She does exercise at least 3 days per week  ROS General: Negative; No fevers, chills, or night sweats;  HEENT: Negative; No changes in vision or hearing, sinus congestion, difficulty swallowing Pulmonary: Negative; No cough, wheezing, shortness of breath, hemoptysis Cardiovascular: Negative; No chest pain, presyncope, syncope, palpitations GI: Negative; No nausea, vomiting, diarrhea, or abdominal pain GU: Negative; No dysuria, hematuria, or difficulty voiding Musculoskeletal: Positive for left hip degeneration Hematologic/Oncology: Negative; no easy bruising, bleeding Endocrine: Negative; no heat/cold intolerance; no diabetes Neuro: Negative; no changes in balance, headaches Skin: Negative; No rashes or skin lesions Psychiatric: Positive for bipolar disorder felt to be atypical. Sleep: Negative; No snoring, daytime sleepiness, hypersomnolence, bruxism, restless legs, hypnogognic hallucinations, no cataplexy Other comprehensive 14 point system review is negative.   PE BP (!) 142/76   Pulse 75   Ht 5' (1.524 m)   Wt 114 lb (51.7 kg)   BMI 22.26 kg/m    Repeat blood pressure by me 132/76.  Wt  Readings from Last 3 Encounters:  01/18/16 114 lb (51.7 kg)  12/08/15 114 lb (51.7 kg)  11/24/15 114 lb 6.4 oz (51.9 kg)   General: Alert, oriented, no distress.  Skin: normal turgor, no rashes HEENT: Normocephalic, atraumatic. Pupils round and reactive; sclera anicteric;no lid lag.  Nose without nasal septal hypertrophy Mouth/Parynx benign; Mallinpatti scale 2 Neck: No JVD, no carotid briuts Lungs: clear to ausculatation and percussion; no wheezing or rales Chest wall: Nontender to palpation Heart: RRR, s1 s2 normal ; 2//6 systolic murmur; no S3 gallop.  No rubs thrills or heaves. Abdomen: soft, nontender; no hepatosplenomehaly, BS+; abdominal aorta nontender and not dilated by palpation. Pulses 2+ Back: No CVA Extremities: no clubbing cyanosis or edema, Homan's sign negative  Neurologic:  grossly nonfocal Psychologic: Normal affect and mood  ECG (independently read by me): Normal sinus rhythm at 75 bpm.  No ST segment changes.  QTc interval normal at 422 ms.  No ectopy.  November 2017 ECG (independently read by me): Normal sinus rhythm at 67 bpm.  Normal intervals.  No significant ST-T changes.  November 2014 ECG: Sinus rhythm at 64 beats per minute. QTc interval 412 ms. Agnostic Q-wave in lead III and F  LABS:  BMP Latest Ref Rng & Units 08/11/2015 02/25/2010  Glucose 65 - 99 mg/dL 82 94  BUN 7 - 25 mg/dL 16 18  Creatinine 0.50 - 0.99 mg/dL 0.70 0.7  Sodium 135 - 146 mmol/L 143 144  Potassium 3.5 - 5.3 mmol/L 4.7 4.1  Chloride 98 - 110 mmol/L 105 106  CO2 20 - 31 mmol/L 27 25  Calcium 8.6 - 10.4 mg/dL 9.4 9.6   Hepatic Function Latest Ref Rng & Units 08/11/2015  Total Protein 6.1 - 8.1 g/dL 6.4  Albumin 3.6 - 5.1 g/dL 4.2  AST 10 - 35 U/L 21  ALT 6 - 29 U/L 12  Alk Phosphatase 33 - 130 U/L 91  Total Bilirubin 0.2 - 1.2 mg/dL 0.5   CBC Latest Ref Rng & Units 08/11/2015 02/25/2010  WBC 3.8 - 10.8 K/uL 6.4 8.0  Hemoglobin 11.7 - 15.5 g/dL 12.8 14.3  Hematocrit 35.0 - 45.0 %  38.8 41.9  Platelets 140 - 400 K/uL 272 262   Lab Results  Component Value Date   MCV 91.1 08/11/2015   MCV 85.5 02/25/2010   Lab Results  Component Value Date   TSH 3.00 08/11/2015   Lipid Panel  No results found for: CHOL, TRIG, HDL, CHOLHDL, VLDL, LDLCALC, LDLDIRECT  RADIOLOGY: No results found.  IMPRESSION:  1. Coronary artery disease due to lipid rich plaque   2. Essential hypertension   3. Heart murmur, systolic   4. Idiopathic hypersomnolence   5. Hyperlipidemia with target LDL less than 70   6. Preoperative clearance     ASSESSMENT AND PLAN: Ms Deremer is a 66 year old Caucasian female who underwent CABG revascularization surgery in 2007 after cardiac catheterization revealed 95% ostial LAD stenosis encroaching upon the left main. She underwent off-pump LIMA to LAD bypass. Prior to her surgery, she was completely asymptomatic and anterior ischemia was noted routinely on Cardiolite imaging.  I reviewed her most recent nuclear perfusion study.  An echo Doppler assessment.  With her in detail.  Her nuclear perfusion study reveals normal perfusion; ejection fraction 69%.  Her echo Doppler study did not reveal any major valvular pathology, although she had mitral annular calcification with trivial MR and mild TR.  Her aortic valve was without stenosis.  She had normal systolic function with an EF of 65-70%.  Her blood pressure today is improved and she is back on valsartan 160 mg daily.  She is on Crestor 20 mg for hyperlipidemia with target LDL less than 70.  She continues to take aspirin 81 mg for CAD and can hold this prior to her surgery for 5-7 days if necessary.  She tells me she recently underwent colonoscopy with Dr. Earlean Shawl, which was normal.  She has been taking Pepcid while on Celebrex.  I have given her clearance to undergo her hip surgery.  I will see her in one year for follow-up evaluation.  Time spent : 43mnutes TTroy Sine MD, [place] 01/18/2016 6:58 PM

## 2016-01-20 DIAGNOSIS — M1612 Unilateral primary osteoarthritis, left hip: Secondary | ICD-10-CM | POA: Diagnosis not present

## 2016-02-21 NOTE — H&P (Signed)
TOTAL HIP ADMISSION H&P  Patient is admitted for left total hip arthroplasty, anterior approach.  Subjective:  Chief Complaint:   Left hip primary OA / pain  HPI: Brandy [name], 66 y.o. female, has a history of pain and functional disability in the left hip(s) due to arthritis and patient has failed non-surgical conservative treatments for greater than 12 weeks to include NSAID's and/or analgesics and activity modification.  Onset of symptoms was gradual starting  years ago with gradually worsening course since that time.The patient noted no past surgery on the left hip(s).  Patient currently rates pain in the left hip at 8 out of 10 with activity. Patient has worsening of pain with activity and weight bearing, trendelenberg gait, pain that interfers with activities of daily living and pain with passive range of motion. Patient has evidence of periarticular osteophytes and joint space narrowing by imaging studies. This condition presents safety issues increasing the risk of falls.  There is no current active infection.  Risks, benefits and expectations were discussed with the patient.  Risks including but not limited to the risk of anesthesia, blood clots, nerve damage, blood vessel damage, failure of the prosthesis, infection and up to and including death.  Patient understand the risks, benefits and expectations and wishes to proceed with surgery.   PCP: No PCP Per Patient  D/C Plans:       Home  Post-op Meds:       No Rx given  Tranexamic Acid:      To be given - IV  Decadron:      Is to be given  FYI:     ASA     Patient Active Problem List   Diagnosis Date Noted  . 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 Hyperlipidemia   . Idiopathic hypersomnia   . CAD (coronary artery disease)   . Hypertension   . Bipolar 1 disorder ([place])   . BIPOLAR AFFECTIVE DISORDER 12/28/2006   Past Medical History:  Diagnosis Date  . Bipolar 1 disorder ([place])   .  CAD (coronary artery disease) 2007   CABG X 1  . Hyperlipidemia   . Hypertension   . Idiopathic hypersomnia     Past Surgical History:  Procedure Laterality Date  . CORONARY ARTERY BYPASS GRAFT  03/21/05   off pump LIMA-LAD  . myomectomy  1981  . ROTATOR CUFF REPAIR  1998  . SHOULDER ARTHROSCOPY  2006  . TOTAL ABDOMINAL HYSTERECTOMY  1991  . trach    . VESICOVAGINAL FISTULA CLOSURE W/ TAH      No prescriptions prior to admission.   Allergies  Allergen Reactions  . Penicillins Anaphylaxis    Has patient had a PCN reaction causing immediate rash, facial/tongue/throat swelling, SOB or lightheadedness with hypotension: Yes Has patient had a PCN reaction causing severe rash involving mucus membranes or skin necrosis: No Has patient had a PCN reaction that required hospitalization Yes Has patient had a PCN reaction occurring within the last 10 years: No If all of the above answers are "NO", then may proceed with Cephalosporin use.  Marland Kitchen Bextra [Valdecoxib] Hives  . Codeine Nausea And Vomiting  . Lactose Intolerance (Gi) Diarrhea  . Meperidine Hcl Nausea And Vomiting  . Morphine Nausea And Vomiting  . Pentazocine Lactate     Muscle spasms     Social History  Substance Use Topics  . Smoking status: Never Smoker  . Smokeless tobacco: Never Used  . Alcohol use  No    Family History  Problem Relation Age of Onset  . Stroke Mother     died 73  . Lung cancer Father     died 18  . Bipolar disorder    . Hypertension       Review of Systems  Constitutional: Negative.   HENT: Negative.   Eyes: Negative.   Respiratory: Negative.   Cardiovascular: Negative.   Gastrointestinal: Negative.   Genitourinary: Negative.   Musculoskeletal: Positive for joint pain.  Skin: Negative.   Neurological: Negative.   Endo/Heme/Allergies: Negative.   Psychiatric/Behavioral: Positive for depression.    Objective:  Physical Exam  Constitutional: She is oriented to person, place, and time.  She appears well-developed.  HENT:  Head: Normocephalic.  Eyes: Pupils are equal, round, and reactive to light.  Neck: Neck supple. No JVD present. No tracheal deviation present. No thyromegaly present.  Cardiovascular: Normal rate, regular rhythm, normal heart sounds and intact distal pulses.   Respiratory: Effort normal and breath sounds normal. No respiratory distress. She has no wheezes.  GI: Soft. There is no tenderness. There is no guarding.  Musculoskeletal:       Left hip: She exhibits decreased range of motion, decreased strength, tenderness and bony tenderness. She exhibits no swelling, no deformity and no laceration.  Lymphadenopathy:    She has no cervical adenopathy.  Neurological: She is alert and oriented to person, place, and time.  Skin: Skin is warm and dry.  Psychiatric: She has a normal mood and affect.      Labs:   Estimated body mass index is 22.26 kg/m as calculated from the following:   Height as of 01/18/16: 5' (1.524 m).   Weight as of 01/18/16: 51.7 kg (114 lb).   Imaging Review Plain radiographs demonstrate severe degenerative joint disease of the left hip(s). The bone quality appears to be good for age and reported activity level.  Assessment/Plan:  End stage arthritis, left hip(s)  The patient history, physical examination, clinical judgement of the provider and imaging studies are consistent with end stage degenerative joint disease of the left hip(s) and total hip arthroplasty is deemed medically necessary. The treatment options including medical management, injection therapy, arthroscopy and arthroplasty were discussed at length. The risks and benefits of total hip arthroplasty were presented and reviewed. The risks due to aseptic loosening, infection, stiffness, dislocation/subluxation,  thromboembolic complications and other imponderables were discussed.  The patient acknowledged the explanation, agreed to proceed with the plan and consent was signed.  Patient is being admitted for inpatient treatment for surgery, pain control, PT, OT, prophylactic antibiotics, VTE prophylaxis, progressive ambulation and ADL's and discharge planning.The patient is planning to be discharged home.      [place] Pugh Anayia Eugene   PA-C  02/21/2016, 2:52 PM

## 2016-02-22 DIAGNOSIS — F322 Major depressive disorder, single episode, severe without psychotic features: Secondary | ICD-10-CM | POA: Diagnosis not present

## 2016-02-22 NOTE — Patient Instructions (Addendum)
ANDREIA ELEAZER  02/22/2016   Your procedure is scheduled on: 03/01/16  Report to [facility]  Entrance take [place]  elevators to 3rd floor to  [place] at    0540 AM.  Call this number if you have problems the morning of surgery [phone]   Remember: ONLY 1 PERSON MAY GO WITH YOU TO SHORT STAY TO GET  READY MORNING OF [place].  Do not eat food or drink liquids :After Midnight.     Take these medicines the morning of surgery with A SIP OF WATER:  prozac                                You may not have any metal on your body including hair pins and              piercings  Do not wear jewelry, make-up, lotions, powders or perfumes, deodorant             Do not wear nail polish.  Do not shave  48 hours prior to surgery.                Do not bring valuables to the hospital. [place].  Contacts, dentures or bridgework may not be worn into surgery.  Leave suitcase in the car. After surgery it may be brought to your room.                Please read over the following fact sheets you were given: _____________________________________________________________________             [facility] - Preparing for Surgery Before surgery, you can play an important role.  Because skin is not sterile, your skin needs to be as free of germs as possible.  You can reduce the number of germs on your skin by washing with CHG (chlorahexidine gluconate) soap before surgery.  CHG is an antiseptic cleaner which kills germs and bonds with the skin to continue killing germs even after washing. Please DO NOT use if you have an allergy to CHG or antibacterial soaps.  If your skin becomes reddened/irritated stop using the CHG and inform your nurse when you arrive at Short Stay. Do not shave (including legs and underarms) for at least 48 hours prior to the first CHG shower.  You may shave your face/neck. Please follow these  instructions carefully:  1.  Shower with CHG Soap the night before surgery and the  morning of Surgery.  2.  If you choose to wash your hair, wash your hair first as usual with your  normal  shampoo.  3.  After you shampoo, rinse your hair and body thoroughly to remove the  shampoo.                           4.  Use CHG as you would any other liquid soap.  You can apply chg directly  to the skin and wash                       Gently with a scrungie or clean washcloth.  5.  Apply the CHG Soap to your body ONLY  FROM THE NECK DOWN.   Do not use on face/ open                           Wound or open sores. Avoid contact with eyes, ears mouth and genitals (private parts).                       Wash face,  Genitals (private parts) with your normal soap.             6.  Wash thoroughly, paying special attention to the area where your surgery  will be performed.  7.  Thoroughly rinse your body with warm water from the neck down.  8.  DO NOT shower/wash with your normal soap after using and rinsing off  the CHG Soap.                9.  Pat yourself dry with a clean towel.            10.  Wear clean pajamas.            11.  Place clean sheets on your bed the night of your first shower and do not  sleep with pets. Day of Surgery : Do not apply any lotions/deodorants the morning of surgery.  Please wear clean clothes to the hospital/surgery center.  FAILURE TO FOLLOW THESE INSTRUCTIONS MAY RESULT IN THE CANCELLATION OF YOUR SURGERY PATIENT SIGNATURE_________________________________  NURSE SIGNATURE__________________________________  ________________________________________________________________________  WHAT IS A BLOOD TRANSFUSION? Blood Transfusion Information  A transfusion is the replacement of blood or some of its parts. Blood is made up of multiple cells which provide different functions.  Red blood cells carry oxygen and are used for blood loss replacement.  White blood cells fight against  infection.  Platelets control bleeding.  Plasma helps clot blood.  Other blood products are available for specialized needs, such as hemophilia or other clotting disorders. BEFORE THE TRANSFUSION  Who gives blood for transfusions?   Healthy volunteers who are fully evaluated to make sure their blood is safe. This is blood bank blood. Transfusion therapy is the safest it has ever been in the practice of medicine. Before blood is taken from a donor, a complete history is taken to make sure that person has no history of diseases nor engages in risky social behavior (examples are intravenous drug use or sexual activity with multiple partners). The donor's travel history is screened to minimize risk of transmitting infections, such as malaria. The donated blood is tested for signs of infectious diseases, such as HIV and hepatitis. The blood is then tested to be sure it is compatible with you in order to minimize the chance of a transfusion reaction. If you or a relative donates blood, this is often done in anticipation of surgery and is not appropriate for emergency situations. It takes many days to process the donated blood. RISKS AND COMPLICATIONS Although transfusion therapy is very safe and saves many lives, the main dangers of transfusion include:   Getting an infectious disease.  Developing a transfusion reaction. This is an allergic reaction to something in the blood you were given. Every precaution is taken to prevent this. The decision to have a blood transfusion has been considered carefully by your caregiver before blood is given. Blood is not given unless the benefits outweigh the risks. AFTER THE TRANSFUSION  Right after receiving a blood transfusion, you will usually feel much better  and more energetic. This is especially true if your red blood cells have gotten low (anemic). The transfusion raises the level of the red blood cells which carry oxygen, and this usually causes an energy  increase.  The nurse administering the transfusion will monitor you carefully for complications. HOME CARE INSTRUCTIONS  No special instructions are needed after a transfusion. You may find your energy is better. Speak with your caregiver about any limitations on activity for underlying diseases you may have. SEEK MEDICAL CARE IF:   Your condition is not improving after your transfusion.  You develop redness or irritation at the intravenous (IV) site. SEEK IMMEDIATE MEDICAL CARE IF:  Any of the following symptoms occur over the next 12 hours:  Shaking chills.  You have a temperature by mouth above 102 F (38.9 C), not controlled by medicine.  Chest, back, or muscle pain.  People around you feel you are not acting correctly or are confused.  Shortness of breath or difficulty breathing.  Dizziness and fainting.  You get a rash or develop hives.  You have a decrease in urine output.  Your urine turns a dark color or changes to pink, red, or brown. Any of the following symptoms occur over the next 10 days:  You have a temperature by mouth above 102 F (38.9 C), not controlled by medicine.  Shortness of breath.  Weakness after normal activity.  The white part of the eye turns yellow (jaundice).  You have a decrease in the amount of urine or are urinating less often.  Your urine turns a dark color or changes to pink, red, or brown. Document Released: 12/25/1999 Document Revised: 03/21/2011 Document Reviewed: 08/13/2007 ExitCare Patient Information 2014 [place].  _______________________________________________________________________  Incentive Spirometer  An incentive spirometer is a tool that can help keep your lungs clear and active. This tool measures how well you are filling your lungs with each breath. Taking long deep breaths may help reverse or decrease the chance of developing breathing (pulmonary) problems (especially infection) following:  A long  period of time when you are unable to move or be active. BEFORE THE PROCEDURE   If the spirometer includes an indicator to show your best effort, your nurse or respiratory therapist will set it to a desired goal.  If possible, sit up straight or lean slightly forward. Try not to slouch.  Hold the incentive spirometer in an upright position. INSTRUCTIONS FOR USE  1. Sit on the edge of your bed if possible, or sit up as far as you can in bed or on a chair. 2. Hold the incentive spirometer in an upright position. 3. Breathe out normally. 4. Place the mouthpiece in your mouth and seal your lips tightly around it. 5. Breathe in slowly and as deeply as possible, raising the piston or the ball toward the top of the column. 6. Hold your breath for 3-5 seconds or for as long as possible. Allow the piston or ball to fall to the bottom of the column. 7. Remove the mouthpiece from your mouth and breathe out normally. 8. Rest for a few seconds and repeat Steps 1 through 7 at least 10 times every 1-2 hours when you are awake. Take your time and take a few normal breaths between deep breaths. 9. The spirometer may include an indicator to show your best effort. Use the indicator as a goal to work toward during each repetition. 10. After each set of 10 deep breaths, practice coughing to be sure your  lungs are clear. If you have an incision (the cut made at the time of surgery), support your incision when coughing by placing a pillow or rolled up towels firmly against it. Once you are able to get out of bed, walk around indoors and cough well. You may stop using the incentive spirometer when instructed by your caregiver.  RISKS AND COMPLICATIONS  Take your time so you do not get dizzy or light-headed.  If you are in pain, you may need to take or ask for pain medication before doing incentive spirometry. It is harder to take a deep breath if you are having pain. AFTER USE  Rest and breathe slowly and  easily.  It can be helpful to keep track of a log of your progress. Your caregiver can provide you with a simple table to help with this. If you are using the spirometer at home, follow these instructions: [place] IF:   You are having difficultly using the spirometer.  You have trouble using the spirometer as often as instructed.  Your pain medication is not giving enough relief while using the spirometer.  You develop fever of 100.5 F (38.1 C) or higher. SEEK IMMEDIATE MEDICAL CARE IF:   You cough up bloody sputum that had not been present before.  You develop fever of 102 F (38.9 C) or greater.  You develop worsening pain at or near the incision site. MAKE SURE YOU:   Understand these instructions.  Will watch your condition.  Will get help right away if you are not doing well or get worse. Document Released: 05/09/2006 Document Revised: 03/21/2011 Document Reviewed: 07/10/2006 [facility] Patient Information 2014 [place], [place].   ________________________________________________________________________

## 2016-02-23 ENCOUNTER — Encounter (HOSPITAL_COMMUNITY)
Admission: RE | Admit: 2016-02-23 | Discharge: 2016-02-23 | Disposition: A | Discharge status: Home / Self Care | Payer: Medicare Other | Source: Ambulatory Visit | Attending: Orthopedic Surgery | Admitting: Orthopedic Surgery

## 2016-02-23 ENCOUNTER — Encounter (HOSPITAL_COMMUNITY): Payer: Self-pay

## 2016-02-23 ENCOUNTER — Encounter (INDEPENDENT_AMBULATORY_CARE_PROVIDER_SITE_OTHER): Payer: Self-pay

## 2016-02-23 DIAGNOSIS — Z01818 Encounter for other preprocedural examination: Secondary | ICD-10-CM | POA: Diagnosis not present

## 2016-02-23 DIAGNOSIS — M1712 Unilateral primary osteoarthritis, left knee: Secondary | ICD-10-CM | POA: Insufficient documentation

## 2016-02-23 HISTORY — DX: Other specified postprocedural states: Z98.890

## 2016-02-23 HISTORY — DX: Nonrheumatic mitral (valve) prolapse: I34.1

## 2016-02-23 HISTORY — DX: Headache, unspecified: R51.9

## 2016-02-23 HISTORY — DX: Unspecified osteoarthritis, unspecified site: M19.90

## 2016-02-23 HISTORY — DX: Headache: R51

## 2016-02-23 HISTORY — DX: Cardiac murmur, unspecified: R01.1

## 2016-02-23 HISTORY — DX: Major depressive disorder, single episode, unspecified: F32.9

## 2016-02-23 HISTORY — DX: Other specified postprocedural states: R11.2

## 2016-02-23 HISTORY — DX: Depression, unspecified: F32.A

## 2016-02-23 LAB — BASIC METABOLIC PANEL
Anion gap: 7 (ref 5–15)
BUN: 26 mg/dL — AB (ref 6–20)
CALCIUM: 9.3 mg/dL (ref 8.9–10.3)
CO2: 24 mmol/L (ref 22–32)
Chloride: 110 mmol/L (ref 101–111)
Creatinine, Ser: 0.73 mg/dL (ref 0.44–1.00)
GFR calc non Af Amer: >60 mL/min (ref >60)
Glucose, Bld: 97 mg/dL (ref 65–99)
Potassium: 4.4 mmol/L (ref 3.5–5.1)
SODIUM: 141 mmol/L (ref 135–145)

## 2016-02-23 LAB — SURGICAL PCR SCREEN
MRSA, PCR: NEGATIVE
STAPHYLOCOCCUS AUREUS: POSITIVE — AB

## 2016-02-23 LAB — CBC
HCT: 40 % (ref 36.0–46.0)
Hemoglobin: 13.1 g/dL (ref 12.0–15.0)
MCH: 28.9 pg (ref 26.0–34.0)
MCHC: 32.8 g/dL (ref 30.0–36.0)
MCV: 88.1 fL (ref 78.0–100.0)
PLATELETS: 306 10*3/uL (ref 150–400)
RBC: 4.54 MIL/uL (ref 3.87–5.11)
RDW: 12.7 % (ref 11.5–15.5)
WBC: 6.3 10*3/uL (ref 4.0–10.5)

## 2016-02-23 LAB — ABO/RH: ABO/RH(D): A POS

## 2016-02-23 NOTE — Progress Notes (Signed)
EKG 01/18/16 Echo 01/14/16  12/08/15  card clearance 01/18/16 chart All in epic

## 2016-02-23 NOTE — Progress Notes (Signed)
Bmp ROUTED TO Dr. Alvan Dame that was done 02/23/16 via epic

## 2016-03-01 ENCOUNTER — Encounter (HOSPITAL_COMMUNITY): Payer: Self-pay | Admitting: *Deleted

## 2016-03-01 ENCOUNTER — Inpatient Hospital Stay (HOSPITAL_COMMUNITY)
Admission: RE | Admit: 2016-03-01 | Discharge: 2016-03-02 | DRG: 470 | Disposition: A | Discharge status: Home / Self Care | Payer: Medicare Other | Source: Ambulatory Visit | Attending: Orthopedic Surgery | Admitting: Orthopedic Surgery

## 2016-03-01 ENCOUNTER — Inpatient Hospital Stay (HOSPITAL_COMMUNITY): Payer: Medicare Other | Admitting: Anesthesiology

## 2016-03-01 ENCOUNTER — Inpatient Hospital Stay (HOSPITAL_COMMUNITY): Payer: Medicare Other

## 2016-03-01 ENCOUNTER — Encounter (HOSPITAL_COMMUNITY)
Admission: RE | Disposition: A | Discharge status: Home / Self Care | Payer: Self-pay | Source: Ambulatory Visit | Attending: Orthopedic Surgery

## 2016-03-01 DIAGNOSIS — Z91011 Allergy to milk products: Secondary | ICD-10-CM

## 2016-03-01 DIAGNOSIS — I2510 Atherosclerotic heart disease of native coronary artery without angina pectoris: Secondary | ICD-10-CM | POA: Diagnosis present

## 2016-03-01 DIAGNOSIS — Z951 Presence of aortocoronary bypass graft: Secondary | ICD-10-CM | POA: Diagnosis not present

## 2016-03-01 DIAGNOSIS — Z96642 Presence of left artificial hip joint: Secondary | ICD-10-CM

## 2016-03-01 DIAGNOSIS — Z880 Allergy status to penicillin: Secondary | ICD-10-CM

## 2016-03-01 DIAGNOSIS — F329 Major depressive disorder, single episode, unspecified: Secondary | ICD-10-CM | POA: Diagnosis present

## 2016-03-01 DIAGNOSIS — I959 Hypotension, unspecified: Secondary | ICD-10-CM | POA: Diagnosis not present

## 2016-03-01 DIAGNOSIS — Z885 Allergy status to narcotic agent status: Secondary | ICD-10-CM

## 2016-03-01 DIAGNOSIS — I10 Essential (primary) hypertension: Secondary | ICD-10-CM | POA: Diagnosis present

## 2016-03-01 DIAGNOSIS — M1612 Unilateral primary osteoarthritis, left hip: Principal | ICD-10-CM | POA: Diagnosis present

## 2016-03-01 DIAGNOSIS — Z888 Allergy status to other drugs, medicaments and biological substances status: Secondary | ICD-10-CM

## 2016-03-01 DIAGNOSIS — I341 Nonrheumatic mitral (valve) prolapse: Secondary | ICD-10-CM | POA: Diagnosis present

## 2016-03-01 DIAGNOSIS — E785 Hyperlipidemia, unspecified: Secondary | ICD-10-CM | POA: Diagnosis present

## 2016-03-01 DIAGNOSIS — Z471 Aftercare following joint replacement surgery: Secondary | ICD-10-CM | POA: Diagnosis not present

## 2016-03-01 HISTORY — PX: TOTAL HIP ARTHROPLASTY: SHX124

## 2016-03-01 LAB — TYPE AND SCREEN
ABO/RH(D): A POS
ANTIBODY SCREEN: NEGATIVE

## 2016-03-01 SURGERY — ARTHROPLASTY, HIP, TOTAL, ANTERIOR APPROACH
Anesthesia: Spinal | Site: Hip | Laterality: Left

## 2016-03-01 MED ORDER — GENTAMICIN SULFATE 40 MG/ML IJ SOLN
5.0000 mg/kg | INTRAVENOUS | Status: AC
  Administered 2016-03-01: 250 mg via INTRAVENOUS
  Filled 2016-03-01: qty 6.25

## 2016-03-01 MED ORDER — METHOCARBAMOL 500 MG PO TABS
500.0000 mg | ORAL_TABLET | Freq: Four times a day (QID) | ORAL | Status: DC | PRN
  Administered 2016-03-01: 22:00:00 500 mg via ORAL
  Filled 2016-03-01: qty 1

## 2016-03-01 MED ORDER — FLUTICASONE PROPIONATE 50 MCG/ACT NA SUSP
1.0000 | Freq: Every day | NASAL | Status: DC | PRN
  Filled 2016-03-01: qty 16

## 2016-03-01 MED ORDER — TRANEXAMIC ACID 1000 MG/10ML IV SOLN
1000.0000 mg | INTRAVENOUS | Status: AC
  Administered 2016-03-01: 1000 mg via INTRAVENOUS
  Filled 2016-03-01: qty 1100

## 2016-03-01 MED ORDER — LACTATED RINGERS IV SOLN
INTRAVENOUS | Status: DC | PRN
  Administered 2016-03-01 (×2): via INTRAVENOUS

## 2016-03-01 MED ORDER — MAGNESIUM CITRATE PO SOLN: 1.0000 | Freq: Once | ORAL | Status: DC | PRN

## 2016-03-01 MED ORDER — CELECOXIB 200 MG PO CAPS
200.0000 mg | ORAL_CAPSULE | Freq: Two times a day (BID) | ORAL | Status: DC
  Administered 2016-03-01 – 2016-03-02 (×3): 200 mg via ORAL
  Filled 2016-03-01 (×3): qty 1

## 2016-03-01 MED ORDER — BISACODYL 10 MG RE SUPP: 10.0000 mg | Freq: Every day | RECTAL | Status: DC | PRN

## 2016-03-01 MED ORDER — BUPIVACAINE HCL (PF) 0.5 % IJ SOLN
INTRAMUSCULAR | Status: DC | PRN
  Administered 2016-03-01: 15 mg via INTRATHECAL

## 2016-03-01 MED ORDER — IRBESARTAN 150 MG PO TABS: 150.0000 mg | ORAL_TABLET | Freq: Every day | ORAL | Status: DC

## 2016-03-01 MED ORDER — DEXAMETHASONE SODIUM PHOSPHATE 10 MG/ML IJ SOLN
10.0000 mg | Freq: Once | INTRAMUSCULAR | Status: AC
  Administered 2016-03-02: 07:00:00 10 mg via INTRAVENOUS
  Filled 2016-03-01: qty 1

## 2016-03-01 MED ORDER — ALUM & MAG HYDROXIDE-SIMETH 200-200-20 MG/5ML PO SUSP: 30.0000 mL | ORAL | Status: DC | PRN

## 2016-03-01 MED ORDER — DEXAMETHASONE SODIUM PHOSPHATE 10 MG/ML IJ SOLN
10.0000 mg | Freq: Once | INTRAMUSCULAR | Status: AC
  Administered 2016-03-01: 10 mg via INTRAVENOUS

## 2016-03-01 MED ORDER — FERROUS SULFATE 325 (65 FE) MG PO TABS: 325.0000 mg | ORAL_TABLET | Freq: Three times a day (TID) | ORAL | Status: DC

## 2016-03-01 MED ORDER — VANCOMYCIN HCL 1000 MG IV SOLR
INTRAVENOUS | Status: DC | PRN
  Administered 2016-03-01: 1000 mg via INTRAVENOUS

## 2016-03-01 MED ORDER — ASPIRIN 81 MG PO CHEW
81.0000 mg | CHEWABLE_TABLET | Freq: Two times a day (BID) | ORAL | Status: DC
  Administered 2016-03-01 – 2016-03-02 (×2): 81 mg via ORAL
  Filled 2016-03-01 (×2): qty 1

## 2016-03-01 MED ORDER — MIDAZOLAM HCL 2 MG/2ML IJ SOLN
INTRAMUSCULAR | Status: AC
  Filled 2016-03-01: qty 2

## 2016-03-01 MED ORDER — HYDROCODONE-ACETAMINOPHEN 7.5-325 MG PO TABS
1.0000 | ORAL_TABLET | ORAL | Status: DC
  Administered 2016-03-01 – 2016-03-02 (×8): 1 via ORAL
  Filled 2016-03-01 (×8): qty 1

## 2016-03-01 MED ORDER — STERILE WATER FOR IRRIGATION IR SOLN
Status: DC | PRN
  Administered 2016-03-01: 2000 mL

## 2016-03-01 MED ORDER — SODIUM CHLORIDE 0.9 % IV SOLN
100.0000 mL/h | INTRAVENOUS | Status: DC
  Administered 2016-03-01 – 2016-03-02 (×2): 100 mL/h via INTRAVENOUS
  Filled 2016-03-01 (×5): qty 1000

## 2016-03-01 MED ORDER — PROMETHAZINE HCL 25 MG/ML IJ SOLN: 6.2500 mg | INTRAMUSCULAR | Status: DC | PRN

## 2016-03-01 MED ORDER — PROPOFOL 10 MG/ML IV BOLUS
INTRAVENOUS | Status: AC
  Filled 2016-03-01: qty 20

## 2016-03-01 MED ORDER — ONDANSETRON HCL 4 MG PO TABS: 4.0000 mg | ORAL_TABLET | Freq: Four times a day (QID) | ORAL | Status: DC | PRN

## 2016-03-01 MED ORDER — FAMOTIDINE 20 MG PO TABS
40.0000 mg | ORAL_TABLET | Freq: Every day | ORAL | Status: DC
  Administered 2016-03-01 – 2016-03-02 (×2): 40 mg via ORAL
  Filled 2016-03-01 (×2): qty 2

## 2016-03-01 MED ORDER — POLYETHYLENE GLYCOL 3350 17 G PO PACK: 17.0000 g | PACK | Freq: Two times a day (BID) | ORAL | 0 refills | Status: DC

## 2016-03-01 MED ORDER — FLUOXETINE HCL 20 MG PO TABS
20.0000 mg | ORAL_TABLET | Freq: Every day | ORAL | Status: DC
  Filled 2016-03-01: qty 1

## 2016-03-01 MED ORDER — SODIUM CHLORIDE 0.9 % IR SOLN
Status: DC | PRN
  Administered 2016-03-01: 1000 mL

## 2016-03-01 MED ORDER — SCOPOLAMINE 1 MG/3DAYS TD PT72
MEDICATED_PATCH | TRANSDERMAL | Status: AC
  Filled 2016-03-01: qty 1

## 2016-03-01 MED ORDER — CEFAZOLIN SODIUM-DEXTROSE 2-4 GM/100ML-% IV SOLN: 2.0000 g | INTRAVENOUS | Status: DC

## 2016-03-01 MED ORDER — VANCOMYCIN HCL IN DEXTROSE 1-5 GM/200ML-% IV SOLN
1000.0000 mg | Freq: Two times a day (BID) | INTRAVENOUS | Status: AC
  Administered 2016-03-01: 1000 mg via INTRAVENOUS
  Filled 2016-03-01: qty 200

## 2016-03-01 MED ORDER — POLYETHYLENE GLYCOL 3350 17 G PO PACK
17.0000 g | PACK | Freq: Two times a day (BID) | ORAL | Status: DC
  Administered 2016-03-02: 09:00:00 17 g via ORAL
  Filled 2016-03-01: qty 1

## 2016-03-01 MED ORDER — METOCLOPRAMIDE HCL 5 MG PO TABS: 5.0000 mg | ORAL_TABLET | Freq: Three times a day (TID) | ORAL | Status: DC | PRN

## 2016-03-01 MED ORDER — METOCLOPRAMIDE HCL 5 MG/ML IJ SOLN: 5.0000 mg | Freq: Three times a day (TID) | INTRAMUSCULAR | Status: DC | PRN

## 2016-03-01 MED ORDER — FERROUS SULFATE 325 (65 FE) MG PO TABS
325.0000 mg | ORAL_TABLET | Freq: Three times a day (TID) | ORAL | Status: DC
  Administered 2016-03-02: 325 mg via ORAL
  Filled 2016-03-01: qty 1

## 2016-03-01 MED ORDER — PHENOL 1.4 % MT LIQD
1.0000 | OROMUCOSAL | Status: DC | PRN
  Filled 2016-03-01: qty 177

## 2016-03-01 MED ORDER — METHOCARBAMOL 1000 MG/10ML IJ SOLN
500.0000 mg | Freq: Four times a day (QID) | INTRAVENOUS | Status: DC | PRN
  Filled 2016-03-01: qty 5

## 2016-03-01 MED ORDER — MENTHOL 3 MG MT LOZG: 1.0000 | LOZENGE | OROMUCOSAL | Status: DC | PRN

## 2016-03-01 MED ORDER — DEXAMETHASONE SODIUM PHOSPHATE 10 MG/ML IJ SOLN
INTRAMUSCULAR | Status: AC
  Filled 2016-03-01: qty 1

## 2016-03-01 MED ORDER — ONDANSETRON HCL 4 MG/2ML IJ SOLN: 4.0000 mg | Freq: Four times a day (QID) | INTRAMUSCULAR | Status: DC | PRN

## 2016-03-01 MED ORDER — ONDANSETRON HCL 4 MG/2ML IJ SOLN
INTRAMUSCULAR | Status: AC
  Filled 2016-03-01: qty 2

## 2016-03-01 MED ORDER — MIDAZOLAM HCL 5 MG/5ML IJ SOLN
INTRAMUSCULAR | Status: DC | PRN
  Administered 2016-03-01: 2 mg via INTRAVENOUS

## 2016-03-01 MED ORDER — VANCOMYCIN HCL IN DEXTROSE 1-5 GM/200ML-% IV SOLN
INTRAVENOUS | Status: AC
  Filled 2016-03-01: qty 200

## 2016-03-01 MED ORDER — DOCUSATE SODIUM 100 MG PO CAPS
100.0000 mg | ORAL_CAPSULE | Freq: Two times a day (BID) | ORAL | Status: DC
  Administered 2016-03-01 – 2016-03-02 (×2): 100 mg via ORAL
  Filled 2016-03-01 (×2): qty 1

## 2016-03-01 MED ORDER — DOCUSATE SODIUM 100 MG PO CAPS: 100.0000 mg | ORAL_CAPSULE | Freq: Two times a day (BID) | ORAL | 0 refills | Status: DC

## 2016-03-01 MED ORDER — HYDROMORPHONE HCL 1 MG/ML IJ SOLN: 0.2500 mg | INTRAMUSCULAR | Status: DC | PRN

## 2016-03-01 MED ORDER — HYDROCODONE-ACETAMINOPHEN 7.5-325 MG PO TABS: 1.0000 | ORAL_TABLET | ORAL | 0 refills | Status: DC | PRN

## 2016-03-01 MED ORDER — SCOPOLAMINE 1 MG/3DAYS TD PT72
MEDICATED_PATCH | TRANSDERMAL | Status: DC | PRN
  Administered 2016-03-01: 1 via TRANSDERMAL

## 2016-03-01 MED ORDER — CHLORHEXIDINE GLUCONATE 4 % EX LIQD: 60.0000 mL | Freq: Once | CUTANEOUS | Status: DC

## 2016-03-01 MED ORDER — METHOCARBAMOL 500 MG PO TABS: 500.0000 mg | ORAL_TABLET | Freq: Four times a day (QID) | ORAL | 0 refills | Status: DC | PRN

## 2016-03-01 MED ORDER — HYDROMORPHONE HCL 1 MG/ML IJ SOLN
0.5000 mg | INTRAMUSCULAR | Status: DC | PRN
  Administered 2016-03-02: 0.5 mg via INTRAVENOUS
  Filled 2016-03-01: qty 1

## 2016-03-01 MED ORDER — DIPHENHYDRAMINE HCL 25 MG PO CAPS: 25.0000 mg | ORAL_CAPSULE | Freq: Four times a day (QID) | ORAL | Status: DC | PRN

## 2016-03-01 MED ORDER — ROSUVASTATIN CALCIUM 20 MG PO TABS
20.0000 mg | ORAL_TABLET | Freq: Every evening | ORAL | Status: DC
  Administered 2016-03-01: 15:00:00 20 mg via ORAL
  Filled 2016-03-01: qty 1

## 2016-03-01 MED ORDER — PROPOFOL 500 MG/50ML IV EMUL
INTRAVENOUS | Status: DC | PRN
  Administered 2016-03-01: 100 ug/kg/min via INTRAVENOUS

## 2016-03-01 MED ORDER — MODAFINIL 200 MG PO TABS
100.0000 mg | ORAL_TABLET | Freq: Every day | ORAL | Status: DC
  Administered 2016-03-02: 100 mg via ORAL
  Filled 2016-03-01: qty 1

## 2016-03-01 MED ORDER — FLUOXETINE HCL 20 MG PO CAPS
20.0000 mg | ORAL_CAPSULE | Freq: Every day | ORAL | Status: DC
  Administered 2016-03-01 – 2016-03-02 (×2): 20 mg via ORAL
  Filled 2016-03-01 (×2): qty 1

## 2016-03-01 MED ORDER — ONDANSETRON HCL 4 MG/2ML IJ SOLN
INTRAMUSCULAR | Status: DC | PRN
  Administered 2016-03-01: 4 mg via INTRAVENOUS

## 2016-03-01 SURGICAL SUPPLY — 32 items
ADH SKN CLS APL DERMABOND .7 (GAUZE/BANDAGES/DRESSINGS) ×1
BLADE SAG 18X100X1.27 (BLADE) ×2 IMPLANT
CAPT HIP TOTAL 2 ×1 IMPLANT
CLOTH BEACON ORANGE TIMEOUT ST (SAFETY) ×2 IMPLANT
COVER PERINEAL POST (MISCELLANEOUS) ×2 IMPLANT
DERMABOND ADVANCED (GAUZE/BANDAGES/DRESSINGS) ×1
DERMABOND ADVANCED .7 DNX12 (GAUZE/BANDAGES/DRESSINGS) ×1 IMPLANT
DRAPE STERI IOBAN 125X83 (DRAPES) ×2 IMPLANT
DRAPE U-SHAPE 47X51 STRL (DRAPES) ×4 IMPLANT
DRESSING AQUACEL AG SP 3.5X10 (GAUZE/BANDAGES/DRESSINGS) ×1 IMPLANT
DRSG AQUACEL AG ADV 3.5X10 (GAUZE/BANDAGES/DRESSINGS) ×1 IMPLANT
DRSG AQUACEL AG SP 3.5X10 (GAUZE/BANDAGES/DRESSINGS) ×2
DURAPREP 26ML APPLICATOR (WOUND CARE) ×2 IMPLANT
ELECT REM PT RETURN 9FT ADLT (ELECTROSURGICAL) ×2
ELECTRODE REM PT RTRN 9FT ADLT (ELECTROSURGICAL) ×1 IMPLANT
GLOVE BIOGEL M STRL SZ7.5 (GLOVE) ×2 IMPLANT
GLOVE BIOGEL PI IND STRL 7.5 (GLOVE) ×1 IMPLANT
GLOVE BIOGEL PI INDICATOR 7.5 (GLOVE) ×5
GLOVE ORTHO TXT STRL SZ7.5 (GLOVE) ×2 IMPLANT
GLOVE SURG SS PI 7.5 STRL IVOR (GLOVE) ×2 IMPLANT
GOWN STRL REUS W/ TWL XL LVL3 (GOWN DISPOSABLE) IMPLANT
GOWN STRL REUS W/TWL LRG LVL3 (GOWN DISPOSABLE) ×2 IMPLANT
GOWN STRL REUS W/TWL XL LVL3 (GOWN DISPOSABLE) ×5 IMPLANT
HOLDER FOLEY CATH W/STRAP (MISCELLANEOUS) ×2 IMPLANT
PACK ANTERIOR HIP CUSTOM (KITS) ×2 IMPLANT
SUT MNCRL AB 4-0 PS2 18 (SUTURE) ×2 IMPLANT
SUT VIC AB 1 CT1 36 (SUTURE) ×6 IMPLANT
SUT VIC AB 2-0 CT1 27 (SUTURE) ×4
SUT VIC AB 2-0 CT1 TAPERPNT 27 (SUTURE) ×2 IMPLANT
SUT VLOC 180 0 24IN GS25 (SUTURE) ×2 IMPLANT
TRAY FOLEY CATH SILVER 14FR (SET/KITS/TRAYS/PACK) ×1 IMPLANT
YANKAUER SUCT BULB TIP 10FT TU (MISCELLANEOUS) ×1 IMPLANT

## 2016-03-01 NOTE — Anesthesia Postprocedure Evaluation (Addendum)
Anesthesia Post Note  Patient: Brandy [name]  Procedure(s) Performed: Procedure(s) (LRB): LEFT TOTAL HIP ARTHROPLASTY ANTERIOR APPROACH (Left)  Patient location during evaluation: PACU Anesthesia Type: Spinal Level of consciousness: oriented and awake and alert Pain management: pain level controlled Vital Signs Assessment: post-procedure vital signs reviewed and stable Respiratory status: spontaneous breathing, respiratory function stable and patient connected to nasal cannula oxygen Cardiovascular status: blood pressure returned to baseline and stable Postop Assessment: no headache and no backache Anesthetic complications: no       Last Vitals:  Vitals:   03/01/16 1027 03/01/16 1100  BP: (!) 108/59 (!) 116/59  Pulse:  (!) 52  Resp: 15 13  Temp: 36.7 C 36.6 C    Last Pain:  Vitals:   03/01/16 0610  TempSrc: Oral                 [name] S

## 2016-03-01 NOTE — Anesthesia Preprocedure Evaluation (Signed)
Anesthesia Evaluation  Patient identified by MRN, date of birth, ID band Patient awake    Reviewed: Allergy & Precautions, NPO status , Patient's Chart, lab work & pertinent test results  History of Anesthesia Complications (+) PONV  Airway Mallampati: II  TM Distance: >3 FB Neck ROM: Full    Dental no notable dental hx.    Pulmonary neg pulmonary ROS,    Pulmonary exam normal breath sounds clear to auscultation       Cardiovascular hypertension, + CAD and + CABG  Normal cardiovascular exam Rhythm:Regular Rate:Normal  - Left ventricle: The cavity size was normal. There was mild   concentric hypertrophy. Systolic function was vigorous. The   estimated ejection fraction was in the range of 65% to 70%. Wall   motion was normal; there were no regional wall motion   abnormalities. Doppler parameters are consistent with abnormal   left ventricular relaxation (grade 1 diastolic dysfunction).   Doppler parameters are consistent with indeterminate ventricular   filling pressure. - Aortic valve: Transvalvular velocity was within the normal range.   There was no stenosis. There was no regurgitation. - Mitral valve: Mildly calcified annulus. Transvalvular velocity   was within the normal range. There was no evidence for stenosis.   There was trivial regurgitation. - Right ventricle: The cavity size was normal. Wall thickness was   normal. Systolic function was normal. - Tricuspid valve: There was mild regurgitation. - Pulmonary arteries: Systolic pressure was within the normal   range. PA peak pressure: 25 mm Hg (S).   Neuro/Psych negative neurological ROS  negative psych ROS   GI/Hepatic negative GI ROS, Neg liver ROS,   Endo/Other  negative endocrine ROS  Renal/GU negative Renal ROS  negative genitourinary   Musculoskeletal negative musculoskeletal ROS (+)   Abdominal   Peds negative pediatric ROS (+)   Hematology negative hematology ROS (+)   Anesthesia Other Findings   Reproductive/Obstetrics negative OB ROS                             Anesthesia Physical Anesthesia Plan  ASA: III  Anesthesia Plan: Spinal   Post-op Pain Management:    Induction: Intravenous  Airway Management Planned: Simple Face Mask  Additional Equipment:   Intra-op Plan:   Post-operative Plan:   Informed Consent: I have reviewed the patients History and Physical, chart, labs and discussed the procedure including the risks, benefits and alternatives for the proposed anesthesia with the patient or authorized representative who has indicated his/her understanding and acceptance.   Dental advisory given  Plan Discussed with: CRNA and Surgeon  Anesthesia Plan Comments:         Anesthesia Quick Evaluation

## 2016-03-01 NOTE — Interval H&P Note (Signed)
History and Physical Interval Note:  03/01/2016 7:17 AM  Brandy [name]  has presented today for surgery, with the diagnosis of Left hip osteoarthritis  The various methods of treatment have been discussed with the patient and family. After consideration of risks, benefits and other options for treatment, the patient has consented to  Procedure(s) with comments: LEFT TOTAL HIP ARTHROPLASTY ANTERIOR APPROACH (Left) - Requests 70 mins as a surgical intervention .  The patient's history has been reviewed, patient examined, no change in status, stable for surgery.  I have reviewed the patient's chart and labs.  Questions were answered to the patient's satisfaction.     Mauri Pole

## 2016-03-01 NOTE — Anesthesia Procedure Notes (Signed)
Spinal  Patient location during procedure: OR Start time: 03/01/2016 8:35 AM End time: 03/01/2016 8:37 AM Staffing Resident/CRNA: Harle Stanford R Performed: resident/CRNA  Preanesthetic Checklist Completed: patient identified, site marked, surgical consent, pre-op evaluation, timeout performed, IV checked, risks and benefits discussed and monitors and equipment checked Spinal Block Patient position: sitting Prep: Betadine Patient monitoring: heart rate, cardiac monitor, continuous pulse ox and blood pressure Approach: midline Location: L3-4 Injection technique: single-shot Needle Needle type: Pencan  Needle gauge: 24 G Needle length: 10 cm Needle insertion depth: 5 cm Assessment Sensory level: T6 Additional Notes Timeout performed. SAB kit date checked. SAB without difficulty

## 2016-03-01 NOTE — Op Note (Signed)
NAME:  Brandy [name]                ACCOUNT NO.: [id number]      MEDICAL RECORD NO.: [id number]      FACILITY:  [facility]      PHYSICIAN:  Paralee Cancel D  DATE OF BIRTH:  Nov 10, 1950     DATE OF PROCEDURE:  03/01/2016                                 OPERATIVE REPORT         PREOPERATIVE DIAGNOSIS: Left  hip osteoarthritis.      POSTOPERATIVE DIAGNOSIS:  Left hip osteoarthritis.      PROCEDURE:  Left total hip replacement through an anterior approach   utilizing DePuy THR system, component size 65mm pinnacle cup, a size 32+4 neutral   Altrex liner, a size 1 Hi Tri Lock stem with a 32+1 delta ceramic   ball.      SURGEON:  Pietro Cassis. Alvan Dame, M.D.      ASSISTANT: Nehemiah Massed, PA-C     ANESTHESIA:  Spinal.      SPECIMENS:  None.      COMPLICATIONS:  None.      BLOOD LOSS:  150 cc     DRAINS:  None.      INDICATION OF THE PROCEDURE:  Brandy [name] is a 66 y.o. female who had   presented to office for evaluation of left hip pain.  Radiographs revealed   progressive degenerative changes with bone-on-bone   articulation to the  hip joint.  The patient had painful limited range of   motion significantly affecting their overall quality of life.  The patient was failing to    respond to conservative measures, and at this point was ready   to proceed with more definitive measures.  The patient has noted progressive   degenerative changes in his hip, progressive problems and dysfunction   with regarding the hip prior to surgery.  Consent was obtained for   benefit of pain relief.  Specific risk of infection, DVT, component   failure, dislocation, need for revision surgery, as well discussion of   the anterior versus posterior approach were reviewed.  Consent was   obtained for benefit of anterior pain relief through an anterior   approach.      PROCEDURE IN DETAIL:  The patient was brought to operative theater.   Once adequate anesthesia,  preoperative antibiotics, 1gm Vancomycin, gentamycin (weight based single dose), 1 gm of Tranexamic Acid, and 10 mg of Decadron administered.   The patient was positioned supine on the OSI [place] table.  Once adequate   padding of boney process was carried out, we had predraped out the hip, and  used fluoroscopy to confirm orientation of the pelvis and position.      The left hip was then prepped and draped from proximal iliac crest to   mid thigh with shower curtain technique.      Time-out was performed identifying the patient, planned procedure, and   extremity.     An incision was then made 2 cm distal and lateral to the   anterior superior iliac spine extending over the orientation of the   tensor fascia lata muscle and sharp dissection was carried down to the   fascia of the muscle and protractor placed in the soft tissues.  The fascia was then incised.  The muscle belly was identified and swept   laterally and retractor placed along the superior neck.  Following   cauterization of the circumflex vessels and removing some pericapsular   fat, a second cobra retractor was placed on the inferior neck.  A third   retractor was placed on the anterior acetabulum after elevating the   anterior rectus.  A L-capsulotomy was along the line of the   superior neck to the trochanteric fossa, then extended proximally and   distally.  Tag sutures were placed and the retractors were then placed   intracapsular.  We then identified the trochanteric fossa and   orientation of my neck cut, confirmed this radiographically   and then made a neck osteotomy with the femur on traction.  The femoral   head was removed without difficulty or complication.  Traction was let   off and retractors were placed posterior and anterior around the   acetabulum.      The labrum and foveal tissue were debrided.  I began reaming with a 35mm   reamer and reamed up to 42mm reamer with good bony bed preparation and a  32mm   cup was chosen.  The final 36mm Pinnacle cup was then impacted under fluoroscopy  to confirm the depth of penetration and orientation with respect to   abduction.  A screw was placed followed by the hole eliminator.  The final   32+4 neutral Altrex liner was impacted with good visualized rim fit.  The cup was positioned anatomically within the acetabular portion of the pelvis.      At this point, the femur was rolled at 80 degrees.  Further capsule was   released off the inferior aspect of the femoral neck.  I then   released the superior capsule proximally.  The hook was placed laterally   along the femur and elevated manually and held in position with the bed   hook.  The leg was then extended and adducted with the leg rolled to 100   degrees of external rotation.  Once the proximal femur was fully   exposed, I used a box osteotome to set orientation.  I then began   broaching with the starting chili pepper broach and passed this by hand and then broached up to 1.  With the 1 broach in place I chose a high offset neck and did several trial reductions.  The offset was appropriate, leg lengths   appeared to be equal best matched with the +1 versus the +5 head ball, confirmed radiographically.   Given these findings, I went ahead and dislocated the hip, repositioned all   retractors and positioned the right hip in the extended and abducted position.  The final 1 Hi Tri Lock stem was   chosen and it was impacted down to the level of neck cut.  Based on this   and the trial reduction, a 32+1 delta ceramic ball was chosen and   impacted onto a clean and dry trunnion, and the hip was reduced.  The   hip had been irrigated throughout the case again at this point.  I did   reapproximate the superior capsular leaflet to the anterior leaflet   using #1 Vicryl.  The fascia of the   tensor fascia lata muscle was then reapproximated using #1 Vicryl and #0 V-lock sutures.  The   remaining wound was  closed with 2-0 Vicryl and running 4-0 Monocryl.  The hip was cleaned, dried, and dressed sterilely using Dermabond and   Aquacel dressing.  She was then brought   to recovery room in stable condition tolerating the procedure well.    Nehemiah Massed, PA-C was present for the entirety of the case involved from   preoperative positioning, perioperative retractor management, general   facilitation of the case, as well as primary wound closure as assistant.            Pietro Cassis Alvan Dame, M.D.        03/01/2016 9:56 AM

## 2016-03-01 NOTE — Transfer of Care (Signed)
Immediate Anesthesia Transfer of Care Note  Patient: Brandy [name]  Procedure(s) Performed: Procedure(s) with comments: LEFT TOTAL HIP ARTHROPLASTY ANTERIOR APPROACH (Left) - Requests 70 mins  Patient Location: PACU  Anesthesia Type:Spinal  Level of Consciousness: awake, sedated and patient cooperative  Airway & Oxygen Therapy: Patient Spontanous Breathing and Patient connected to face mask oxygen  Post-op Assessment: Report given to RN and Post -op Vital signs reviewed and stable  Post vital signs: Reviewed and stable  Last Vitals:  Vitals:   03/01/16 0610  BP: 124/75  Pulse: 84  Resp: 16  Temp: 36.9 C    Last Pain:  Vitals:   03/01/16 0610  TempSrc: Oral         Complications: No apparent anesthesia complications

## 2016-03-02 LAB — BASIC METABOLIC PANEL
ANION GAP: 3 — AB (ref 5–15)
BUN: 17 mg/dL (ref 6–20)
CHLORIDE: 111 mmol/L (ref 101–111)
CO2: 28 mmol/L (ref 22–32)
Calcium: 8.5 mg/dL — ABNORMAL LOW (ref 8.9–10.3)
Creatinine, Ser: 0.74 mg/dL (ref 0.44–1.00)
GFR calc Af Amer: >60 mL/min (ref >60)
Glucose, Bld: 137 mg/dL — ABNORMAL HIGH (ref 65–99)
POTASSIUM: 4.4 mmol/L (ref 3.5–5.1)
Sodium: 142 mmol/L (ref 135–145)

## 2016-03-02 LAB — CBC
HEMATOCRIT: 30.3 % — AB (ref 36.0–46.0)
HEMOGLOBIN: 9.6 g/dL — AB (ref 12.0–15.0)
MCH: 28.2 pg (ref 26.0–34.0)
MCHC: 31.7 g/dL (ref 30.0–36.0)
MCV: 89.1 fL (ref 78.0–100.0)
PLATELETS: 215 10*3/uL (ref 150–400)
RBC: 3.4 MIL/uL — AB (ref 3.87–5.11)
RDW: 12.8 % (ref 11.5–15.5)
WBC: 10.7 10*3/uL — AB (ref 4.0–10.5)

## 2016-03-02 MED ORDER — SODIUM CHLORIDE 0.9 % IV BOLUS (SEPSIS)
500.0000 mL | Freq: Once | INTRAVENOUS | Status: AC
  Administered 2016-03-02: 09:00:00 500 mL via INTRAVENOUS

## 2016-03-02 MED ORDER — ASPIRIN 81 MG PO CHEW: 81.0000 mg | CHEWABLE_TABLET | Freq: Two times a day (BID) | ORAL | 0 refills | Status: AC

## 2016-03-02 NOTE — Progress Notes (Signed)
Physical Therapy Treatment Patient Details Name: Brandy [name] MRN: [id number] DOB: June 24, 1950 Today's Date: 03/02/2016    History of Present Illness 66 yo female s/p L THA-direct anterior 03/01/16. Hx of bipolar d/o, CABG, HTN, CAD    PT Comments    Progressing with mobility. Reviewed/practiced exercises, gait training, and stair training. Issued HEP for pt to perform 2x/day. All education completed. Ready to d/c from PT standpoint-made RN aware.    Follow Up Recommendations  No PT follow up;Supervision - Intermittent     Equipment Recommendations  None recommended by PT (pt has borrowed a walker)    Recommendations for Other Services OT consult     Precautions / Restrictions Precautions Precautions: Fall Precaution Comments: low BP readings but pt has been asymptomatic Restrictions Weight Bearing Restrictions: No LLE Weight Bearing: Weight bearing as tolerated    Mobility  Bed Mobility Overal bed mobility: Needs Assistance Bed Mobility: Supine to Sit     Supine to sit: Supervision Sit to supine: Supervision   General bed mobility comments: OOB by PT  Transfers Overall transfer level: Needs assistance Equipment used: Rolling walker (2 wheeled) Transfers: Sit to/from Stand Sit to Stand: Supervision         General transfer comment: Cues for proper and safe use of rollator since this is the walker that pt has chosen to borrow/use.   Ambulation/Gait Ambulation/Gait assistance: Min guard Ambulation Distance (Feet): 135 Feet Assistive device: 4-wheeled walker Gait Pattern/deviations: Step-through pattern;Decreased stride length;Antalgic     General Gait Details: Intermittent assist to stabilize due to pt's quick movements-at times uncontrolled. Pt tolerated distance well. Pt denied dizziness. Ambulated with rollator since this is walker pt will be using at home. Continues to require cues for safety, pacing.    Stairs Stairs: Yes   Stair Management:  Step to pattern;Forwards Number of Stairs: 2 General stair comments: 1 HHA given for stair negotiation. Pt states she also has a wall that she can brace against as well. VCs safety, technique, sequence.   Wheelchair Mobility    Modified Rankin (Stroke Patients Only)       Balance                                    Cognition Arousal/Alertness: Awake/alert Behavior During Therapy: WFL for tasks assessed/performed Overall Cognitive Status: Within Functional Limits for tasks assessed                      Exercises Total Joint Exercises  Hip ABduction/ADduction: AROM;Left;10 reps;Standing Knee Flexion: AROM;Left;10 reps;Standing Marching in Standing: AROM;Both;10 reps;Standing General Exercises - Lower Extremity Heel Raises: AROM;Both;10 reps;Standing    General Comments        Pertinent Vitals/Pain Pain Assessment: 0-10 Pain Score: 7  Pain Location: L hip/thigh Pain Descriptors / Indicators: Sore Pain Intervention(s): Monitored during session;Repositioned    Home Living Family/patient expects to be discharged to:: Private residence Living Arrangements: Alone Available Help at Discharge: Friend(s);Available PRN/intermittently                Prior Function Level of Independence: Independent          PT Goals (current goals can now be found in the care plan section) Acute Rehab PT Goals Patient Stated Goal: regain independence and PLOF PT Goal Formulation: With patient Time For Goal Achievement: 03/16/16 Potential to Achieve Goals: Good Progress towards PT goals: Progressing toward goals  Frequency    7X/week      PT Plan Current plan remains appropriate    Co-evaluation             End of Session Equipment Utilized During Treatment: Gait belt Activity Tolerance: Patient tolerated treatment well Patient left: in bed;with call bell/phone within reach   PT Visit Diagnosis: Difficulty in walking, not elsewhere  classified (R26.2)     Time: [id number] PT Time Calculation (min) (ACUTE ONLY): 17 min  Charges:  $Gait Training: 8-22 mins                    G Codes:       Weston Anna, MPT Pager: [phone]

## 2016-03-02 NOTE — Progress Notes (Signed)
     Subjective: 1 Day Post-Op Procedure(s) (LRB): LEFT TOTAL HIP ARTHROPLASTY ANTERIOR APPROACH (Left)   Patient reports pain as mild, pain controlled.  Patient BP been running low, but not really having symptoms.  No events other than hypoTN.    Objective:   VITALS:   Vitals:   03/02/16 0242 03/02/16 0631  BP: (!) 91/45 (!) 85/45  Pulse: (!) 56 62  Resp: 16 16  Temp: 97.9 F (36.6 C) 98.2 F (36.8 C)    Dorsiflexion/Plantar flexion intact Incision: dressing C/D/I No cellulitis present Compartment soft  LABS  Recent Labs  03/02/16 0455  HGB 9.6*  HCT 30.3*  WBC 10.7*  PLT 215     Recent Labs  03/02/16 0455  NA 142  K 4.4  BUN 17  CREATININE 0.74  GLUCOSE 137*     Assessment/Plan: 1 Day Post-Op Procedure(s) (LRB): LEFT TOTAL HIP ARTHROPLASTY ANTERIOR APPROACH (Left) Foley cath d/c'ed Advance diet Up with therapy D/C IV fluids Discharge home Follow up in 2 weeks at [facility]. Follow up with OLIN,Raywood Wailes D in 2 weeks.  Contact information:  [facility] [address], [address] [place] Irean Kendricks   PAC  03/02/2016, 9:11 AM

## 2016-03-02 NOTE — Progress Notes (Signed)
OT Cancellation Note  Patient Details Name: Brandy [name] MRN: [id number] DOB: [birth year]   Cancelled Treatment:    Reason Eval/Treat Not Completed: Other (comment).  Pt's BP remains low. Will check back.  Dorothie Wah 03/02/2016, 11:09 AM  Lesle Chris, OTR/L [phone] 03/02/2016

## 2016-03-02 NOTE — Evaluation (Signed)
Occupational Therapy Evaluation Patient Details Name: Brandy [name] MRN: [id number] DOB: 30-Apr-1950 Today's Date: 03/02/2016    History of Present Illness 66 yo female s/p L THA-direct anterior 03/01/16. Hx of bipolar d/o, CABG, HTN, CAD   Clinical Impression   Pt was admitted for the above.  All education was completed. No further OT is needed at this time    Follow Up Recommendations  No OT follow up    Equipment Recommendations  None recommended by OT    Recommendations for Other Services       Precautions / Restrictions Precautions Precautions: Fall Precaution Comments: low BP readings but pt has been asymptomatic Restrictions Weight Bearing Restrictions: No LLE Weight Bearing: Weight bearing as tolerated      Mobility Bed Mobility Overal bed mobility: Needs Assistance Bed Mobility: Supine to Sit;Sit to Supine     Supine to sit: Supervision Sit to supine: Supervision   General bed mobility comments: OOB by PT  Transfers Overall transfer level: Needs assistance Equipment used: Rolling walker (2 wheeled) Transfers: Sit to/from Stand Sit to Stand: Supervision         General transfer comment: for safety. Pt tends to move quickly.     Balance                                            ADL Overall ADL's : Needs assistance/impaired Eating/Feeding: Independent   Grooming: Modified independent   Upper Body Bathing: Modified independent   Lower Body Bathing: Minimal assistance   Upper Body Dressing : Modified independent   Lower Body Dressing: Minimal assistance   Toilet Transfer: Supervision/safety;Comfort height toilet   Toileting- Clothing Manipulation and Hygiene: Modified independent   Tub/ Shower Transfer: Walk-in shower;Supervision/safety     General ADL Comments: pt has a Secondary school teacher for LB dressing. She can have a friend help her with non-skid socks (and keep them on when no one is around). Demonstrated good safety,  but moves quickly.  Pt is very active at baseline. Educated on sidestepping through tight spaces and carrying items with RW     Vision         Perception     Praxis      Pertinent Vitals/Pain Pain Assessment: 0-10 Pain Score: 3  Pain Location: L hip/thigh Pain Descriptors / Indicators: Sore Pain Intervention(s): Limited activity within patient's tolerance;Monitored during session;Repositioned     Hand Dominance     Extremity/Trunk Assessment Upper Extremity Assessment Upper Extremity Assessment: Overall WFL for tasks assessed      Cervical / Trunk Assessment Cervical / Trunk Assessment: Normal   Communication Communication Communication: No difficulties   Cognition Arousal/Alertness: Awake/alert Behavior During Therapy: WFL for tasks assessed/performed Overall Cognitive Status: Within Functional Limits for tasks assessed                     General Comments       Exercises       Shoulder Instructions      Home Living Family/patient expects to be discharged to:: Private residence Living Arrangements: Alone Available Help at Discharge: Friend(s);Available PRN/intermittently Type of Home: House Home Access: Stairs to enter [employer] of Steps: 2 Entrance Stairs-Rails: None Home Layout: Able to live on main level with bedroom/bathroom     Bathroom Shower/Tub: Occupational psychologist: Handicapped height     Home Equipment:  Adaptive equipment Adaptive Equipment: Reacher Additional Comments: pt will borrow a walker      Prior Functioning/Environment Level of Independence: Independent                 OT Problem List:        OT Treatment/Interventions:      OT Goals(Current goals can be found in the care plan section) Acute Rehab OT Goals Patient Stated Goal: regain independence and PLOF  OT Frequency:     Barriers to D/C:            Co-evaluation              End of Session    Activity Tolerance:  Patient tolerated treatment well Patient left: in chair;with call bell/phone within reach  OT Visit Diagnosis: Pain Pain - Right/Left: Left Pain - part of body: Hip                ADL either performed or assessed with clinical judgement  Time: 1140-1150 OT Time Calculation (min): 10 min Charges:  OT General Charges $OT Visit: 1 Procedure OT Evaluation $OT Eval Low Complexity: 1 Procedure G-Codes:     [place], OTR/L [id number] 03/02/2016  Tymier Lindholm 03/02/2016, 12:17 PM

## 2016-03-02 NOTE — Progress Notes (Signed)
RN reviewed discharge instructions with patient. All questions answered.   Paperwork and prescription given.   NT rolled patient down with all belongings to family car.

## 2016-03-02 NOTE — Evaluation (Signed)
Physical Therapy Evaluation Patient Details Name: KEAUNNA DELBOSQUE MRN: [id number] DOB: June 30, 1950 Today's Date: 03/02/2016   History of Present Illness  66 yo female s/p L THA-direct anterior 03/01/16. Hx of bipolar d/o, CABG, HTN, CAD  Clinical Impression  On eval, pt required Min guard mostly for mobility. She walked ~135 feet with a RW and required intermittent Min assist to stabilize due to quick, uncontrolled pacing. Cues for pt to slow down and exercise caution throughout session. She tolerated activity well. Will plan to practice stair negotiation later today prior to d/c.     Follow Up Recommendations No PT follow up;Supervision - Intermittent    Equipment Recommendations  None recommended by PT (pt states she has borrowed a walker)    Recommendations for Other Services OT consult     Precautions / Restrictions Precautions Precautions: Fall Precaution Comments: low BP readings but pt has been asymptomatic Restrictions Weight Bearing Restrictions: No LLE Weight Bearing: Weight bearing as tolerated      Mobility  Bed Mobility Overal bed mobility: Needs Assistance Bed Mobility: Supine to Sit;Sit to Supine     Supine to sit: Supervision Sit to supine: Supervision   General bed mobility comments: for safety  Transfers Overall transfer level: Needs assistance Equipment used: Rolling walker (2 wheeled) Transfers: Sit to/from Stand Sit to Stand: Supervision         General transfer comment: for safety. Pt tends to move quickly.   Ambulation/Gait Ambulation/Gait assistance: Min guard;Min assist Ambulation Distance (Feet): 135 Feet Assistive device: Rolling walker (2 wheeled) Gait Pattern/deviations: Step-through pattern;Step-to pattern;Decreased stride length     General Gait Details: Intermittent assist to stabilize due to pt's quick movements-at times uncontrolled. Pt tolerated distance well. Pt denied dizziness.   Stairs            Wheelchair  Mobility    Modified Rankin (Stroke Patients Only)       Balance                                             Pertinent Vitals/Pain Pain Assessment: 0-10 Pain Score: 3  Pain Location: L hip/thigh Pain Descriptors / Indicators: Sore Pain Intervention(s): Ice applied;Repositioned    Home Living Family/patient expects to be discharged to:: Private residence Living Arrangements: Alone Available Help at Discharge: Friend(s);Available PRN/intermittently Type of Home: House Home Access: Stairs to enter Entrance Stairs-Rails: None Entrance Stairs-Number of Steps: 2 Home Layout: Able to live on main level with bedroom/bathroom Home Equipment: Adaptive equipment Additional Comments: pt will borrow a walker    Prior Function Level of Independence: Independent               Hand Dominance        Extremity/Trunk Assessment   Upper Extremity Assessment Upper Extremity Assessment: Defer to OT evaluation    Lower Extremity Assessment Lower Extremity Assessment: Generalized weakness (s/p L THA)    Cervical / Trunk Assessment Cervical / Trunk Assessment: Normal  Communication   Communication: No difficulties  Cognition Arousal/Alertness: Awake/alert Behavior During Therapy: WFL for tasks assessed/performed Overall Cognitive Status: Within Functional Limits for tasks assessed                      General Comments      Exercises Total Joint Exercises Ankle Circles/Pumps: AROM;Both;10 reps;Supine Quad Sets: AROM;Both;10 reps;Supine Heel Slides: AAROM;Left;10 reps;Supine Hip  ABduction/ADduction: AAROM;Left;10 reps;Supine   Assessment/Plan    PT Assessment Patient needs continued PT services  PT Problem List Decreased strength;Decreased mobility;Decreased range of motion;Decreased activity tolerance;Decreased balance;Decreased knowledge of use of DME;Pain       PT Treatment Interventions DME instruction;Gait training;Therapeutic  exercise;Therapeutic activities;Patient/family education;Functional mobility training;Balance training;Stair training    PT Goals (Current goals can be found in the Care Plan section)  Acute Rehab PT Goals Patient Stated Goal: regain independence and PLOF PT Goal Formulation: With patient Time For Goal Achievement: 03/16/16 Potential to Achieve Goals: Good    Frequency 7X/week   Barriers to discharge        Co-evaluation               End of Session   Activity Tolerance: Patient tolerated treatment well Patient left: in bed;with call bell/phone within reach   PT Visit Diagnosis: Difficulty in walking, not elsewhere classified (R26.2)         Time: [id number] PT Time Calculation (min) (ACUTE ONLY): 16 min   Charges:   PT Evaluation $PT Eval Low Complexity: 1 Procedure     PT G Codes:         Weston Anna, MPT Pager: [phone]

## 2016-03-04 NOTE — Discharge Summary (Signed)
Physician Discharge Summary  Patient ID: Brandy [name] MRN: [id number] DOB/AGE: 09-24-1950 66 y.o.  Admit date: 03/01/2016 Discharge date: 03/02/2016   Procedures:  Procedure(s) (LRB): LEFT TOTAL HIP ARTHROPLASTY ANTERIOR APPROACH (Left)  Attending Physician:  Dr. Paralee Cancel   Admission Diagnoses:   Left hip primary OA / pain  Discharge Diagnoses:  Principal Problem:   S/P left THA, AA Active Problems:   S/P hip replacement, left  Past Medical History:  Diagnosis Date  . Arthritis   . Bipolar 1 disorder ([place])   . CAD (coronary artery disease) 2007   CABG X 1  . Depression    controlled  . Headache    Hx of migraines  . Heart murmur   . Hyperlipidemia   . Hypertension   . Idiopathic hypersomnia   . Mitral valve prolapse   . PONV (postoperative nausea and vomiting)     HPI:    Brandy [name], 66 y.o. female, has a history of pain and functional disability in the left hip(s) due to arthritis and patient has failed non-surgical conservative treatments for greater than 12 weeks to include NSAID's and/or analgesics and activity modification.  Onset of symptoms was gradual starting  years ago with gradually worsening course since that time.The patient noted no past surgery on the left hip(s).  Patient currently rates pain in the left hip at 8 out of 10 with activity. Patient has worsening of pain with activity and weight bearing, trendelenberg gait, pain that interfers with activities of daily living and pain with passive range of motion. Patient has evidence of periarticular osteophytes and joint space narrowing by imaging studies. This condition presents safety issues increasing the risk of falls.  There is no current active infection.  Risks, benefits and expectations were discussed with the patient.  Risks including but not limited to the risk of anesthesia, blood clots, nerve damage, blood vessel damage, failure of the prosthesis, infection and up to and including  death.  Patient understand the risks, benefits and expectations and wishes to proceed with surgery.   PCP: No PCP Per Patient   Discharged Condition: good  Hospital Course:  Patient underwent the above stated procedure on 03/01/2016. Patient tolerated the procedure well and brought to the recovery room in good condition and subsequently to the floor.  POD #1 BP: 85/45 ; Pulse: 62 ; Temp: 98.2 F (36.8 C) ; Resp: 16 Patient reports pain as mild, pain controlled.  Patient BP been running low, but not really having symptoms.  No events other than hypoTN.   Dorsiflexion/plantar flexion intact, incision: dressing C/D/I, no cellulitis present and compartment soft.   LABS  Basename    HGB     9.6  HCT     30.3    Discharge Exam: General appearance: alert, cooperative and no distress Extremities: Homans sign is negative, no sign of DVT, no edema, redness or tenderness in the calves or thighs and no ulcers, gangrene or trophic changes  Disposition: Home with follow up in 2 weeks   Follow-up Information    Brandy Pole, MD. Schedule an appointment as soon as possible for a visit in 2 week(s).   Specialty:  Orthopedic Surgery Contact information: [address] [place] [address] [id number]           Discharge Instructions    Call MD / Call 911    Complete by:  As directed    If you experience chest pain or shortness of breath,  CALL 911 and be transported to the hospital emergency room.  If you develope a fever above 101 F, pus (white drainage) or increased drainage or redness at the wound, or calf pain, call your surgeon's office.   Change dressing    Complete by:  As directed    Maintain surgical dressing until follow up in the clinic. If the edges start to pull up, may reinforce with tape. If the dressing is no longer working, may remove and cover with gauze and tape, but must keep the area dry and clean.  Call with any questions or concerns.    Constipation Prevention    Complete by:  As directed    Drink plenty of fluids.  Prune juice may be helpful.  You may use a stool softener, such as Colace (over the counter) 100 mg twice a day.  Use MiraLax (over the counter) for constipation as needed.   Diet - low sodium heart healthy    Complete by:  As directed    Discharge instructions    Complete by:  As directed    Maintain surgical dressing until follow up in the clinic. If the edges start to pull up, may reinforce with tape. If the dressing is no longer working, may remove and cover with gauze and tape, but must keep the area dry and clean.  Follow up in 2 weeks at [facility]. Call with any questions or concerns.   Increase activity slowly as tolerated    Complete by:  As directed    Weight bearing as tolerated with assist device (walker, cane, etc) as directed, use it as long as suggested by your surgeon or therapist, typically at least 4-6 weeks.   TED hose    Complete by:  As directed    Use stockings (TED hose) for 2 weeks on both leg(s).  You may remove them at night for sleeping.      Allergies as of 03/02/2016      Reactions   Penicillins Anaphylaxis   Has patient had a PCN reaction causing immediate rash, facial/tongue/throat swelling, SOB or lightheadedness with hypotension: Yes Has patient had a PCN reaction causing severe rash involving mucus membranes or skin necrosis: No Has patient had a PCN reaction that required hospitalization Yes Has patient had a PCN reaction occurring within the last 10 years: No If all of the above answers are "NO", then may proceed with Cephalosporin use.   Bextra [valdecoxib] Hives   Codeine Nausea And Vomiting   Lactose Intolerance (gi) Diarrhea   Meperidine Hcl Nausea And Vomiting   Morphine Nausea And Vomiting   Pentazocine Lactate    Muscle spasms       Medication List    STOP taking these medications   acetaminophen 650 MG CR tablet Commonly known as:  TYLENOL     aspirin EC 81 MG tablet Replaced by:  aspirin 81 MG chewable tablet   traMADol 50 MG tablet Commonly known as:  ULTRAM     TAKE these medications   aspirin 81 MG chewable tablet Chew 1 tablet (81 mg total) by mouth 2 (two) times daily. Take for 4 weeks, then resume regular dose. Replaces:  aspirin EC 81 MG tablet   CALCIUM 500+D 500-400 MG-UNIT Tabs Generic drug:  Calcium Carb-Cholecalciferol Take 2 tablets by mouth daily. Notes to patient:  Home regimen   celecoxib 200 MG capsule Commonly known as:  CELEBREX Take 200 mg by mouth daily.   docusate sodium 100 MG capsule  Commonly known as:  COLACE Take 1 capsule (100 mg total) by mouth 2 (two) times daily.   famotidine 40 MG tablet Commonly known as:  PEPCID Take 40 mg by mouth daily.   ferrous sulfate 325 (65 FE) MG tablet Commonly known as:  FERROUSUL Take 1 tablet (325 mg total) by mouth 3 (three) times daily with meals.   FLUoxetine 20 MG tablet Commonly known as:  PROZAC Take 20 mg by mouth daily.   fluticasone 50 MCG/ACT nasal spray Commonly known as:  FLONASE Place 1 spray into both nostrils daily as needed for allergies or rhinitis.   HYDROcodone-acetaminophen 7.5-325 MG tablet Commonly known as:  NORCO Take 1-2 tablets by mouth every 4 (four) hours as needed for moderate pain or severe pain. Notes to patient:  Last dose at: Available at:   methocarbamol 500 MG tablet Commonly known as:  ROBAXIN Take 1 tablet (500 mg total) by mouth every 6 (six) hours as needed for muscle spasms.   modafinil 100 MG tablet Commonly known as:  PROVIGIL Take 100 mg by mouth daily.   MULTIPLE VITAMINS/WOMENS tablet Take 1 tablet by mouth daily.   polyethylene glycol packet Commonly known as:  MIRALAX / GLYCOLAX Take 17 g by mouth 2 (two) times daily.   rosuvastatin 20 MG tablet Commonly known as:  CRESTOR TAKE 1 TABLET (20 MG TOTAL) BY MOUTH DAILY. What changed:  when to take this   valsartan 160 MG  tablet Commonly known as:  DIOVAN Take 1 tablet (160 mg total) by mouth daily. Notes to patient:  Please assess blood pressure before taking this medication        Signed: West Pugh. Kayler Rise   PA-C  03/04/2016, 2:44 PM

## 2016-03-16 DIAGNOSIS — Z471 Aftercare following joint replacement surgery: Secondary | ICD-10-CM | POA: Diagnosis not present

## 2016-03-16 DIAGNOSIS — Z96642 Presence of left artificial hip joint: Secondary | ICD-10-CM | POA: Diagnosis not present

## 2016-04-18 ENCOUNTER — Encounter: Payer: Self-pay | Admitting: Adult Health

## 2016-04-18 ENCOUNTER — Ambulatory Visit (INDEPENDENT_AMBULATORY_CARE_PROVIDER_SITE_OTHER): Payer: Medicare Other | Admitting: Adult Health

## 2016-04-18 DIAGNOSIS — I2510 Atherosclerotic heart disease of native coronary artery without angina pectoris: Secondary | ICD-10-CM | POA: Diagnosis not present

## 2016-04-18 DIAGNOSIS — I2583 Coronary atherosclerosis due to lipid rich plaque: Secondary | ICD-10-CM | POA: Diagnosis not present

## 2016-04-18 DIAGNOSIS — G4711 Idiopathic hypersomnia with long sleep time: Secondary | ICD-10-CM | POA: Diagnosis not present

## 2016-04-18 MED ORDER — ARMODAFINIL 150 MG PO TABS: 150.0000 mg | ORAL_TABLET | Freq: Every day | ORAL | 0 refills | Status: DC

## 2016-04-18 NOTE — Progress Notes (Signed)
@Patient  ID: Brandy [name], female    DOB: 27-Apr-1950, 66 y.o.   MRN: [id number]  Chief Complaint  Patient presents with  . Follow-up    OSA     Referring provider: No ref. 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 Last seen in office 2015.  She has difficulty with daytime sleepiness. She was started on Provigil 200mg  daily .  By Psych . Says it worked at first but has stopped working for last few months.  Remains sleepy. Feels she has gotten days and nights mixed up. She feels her inactivity has made sx worse, Had Hip replacement 02/2016 and still in recovery . Not as active yet.  Says she has tried Nuvigil in past  And wants to try again to see if this works.  We discussed healthy sleep regimen. Stop watching TV in [place] and stop using phone at nighttime. To go to sleep. Encouraged on resetting circadian rhythm techniques  Advised on daytime naps if needed.      Allergies  Allergen Reactions  . Penicillins Anaphylaxis    Has patient had a PCN reaction causing immediate rash, facial/tongue/throat swelling, SOB or lightheadedness with hypotension: Yes Has patient had a PCN reaction causing severe rash involving mucus membranes or skin necrosis: No Has patient had a PCN reaction that required hospitalization Yes Has patient had a PCN reaction occurring within the last 10 years: No If all of the above answers are "NO", then may proceed with Cephalosporin use.  Marland Kitchen Bextra [Valdecoxib] Hives  . Codeine Nausea And Vomiting  . Lactose Intolerance (Gi) Diarrhea  . Meperidine Hcl Nausea And Vomiting  . Morphine Nausea And Vomiting  . Pentazocine Lactate     Muscle spasms     Immunization History  Administered Date(s) Administered  . Influenza Whole  10/10/2009  . Influenza-Unspecified 11/11/2010    Past Medical History:  Diagnosis Date  . Arthritis   . Bipolar 1 disorder ([place])   . CAD (coronary artery disease) 2007   CABG X 1  . Depression    controlled  . Headache    Hx of migraines  . Heart murmur   . Hyperlipidemia   . Hypertension   . Idiopathic hypersomnia   . Mitral valve prolapse   . PONV (postoperative nausea and vomiting)     Tobacco History: History  Smoking Status  . Never Smoker  Smokeless Tobacco  . Never Used   Counseling given: Not Answered   Outpatient Encounter Prescriptions as of 04/18/2016  Medication Sig  . Calcium Carb-Cholecalciferol (CALCIUM 500+D) 500-400 MG-UNIT TABS Take 2 tablets by mouth daily.  . ferrous sulfate (FERROUSUL) 325 (65 FE) MG tablet Take 1 tablet (325 mg total) by mouth 3 (three) times daily with meals.  Marland Kitchen FLUoxetine (PROZAC) 20 MG tablet Take 20 mg by mouth daily.  . fluticasone (FLONASE) 50 MCG/ACT nasal spray Place 1 spray into both nostrils daily as needed for allergies or rhinitis.  . Multiple Vitamins-Minerals (MULTIPLE VITAMINS/WOMENS) tablet Take 1 tablet by mouth daily.  . valsartan (DIOVAN) 160 MG tablet Take 1 tablet (160 mg total) by mouth daily.  . [DISCONTINUED] modafinil (PROVIGIL) 100 MG tablet Take 100 mg by mouth daily.  . Armodafinil (NUVIGIL) 150 MG tablet Take 1  tablet (150 mg total) by mouth daily.  . rosuvastatin (CRESTOR) 20 MG tablet TAKE 1 TABLET (20 MG TOTAL) BY MOUTH DAILY. (Patient taking differently: Take 20 mg by mouth every evening. )  . [DISCONTINUED] celecoxib (CELEBREX) 200 MG capsule Take 200 mg by mouth daily.  . [DISCONTINUED] docusate sodium (COLACE) 100 MG capsule Take 1 capsule (100 mg total) by mouth 2 (two) times daily.  . [DISCONTINUED] famotidine (PEPCID) 40 MG tablet Take 40 mg by mouth daily.  . [DISCONTINUED] HYDROcodone-acetaminophen (NORCO) 7.5-325 MG tablet Take 1-2 tablets by mouth every 4 (four) hours as needed for moderate  pain or severe pain.  . [DISCONTINUED] methocarbamol (ROBAXIN) 500 MG tablet Take 1 tablet (500 mg total) by mouth every 6 (six) hours as needed for muscle spasms.  . [DISCONTINUED] polyethylene glycol (MIRALAX / GLYCOLAX) packet Take 17 g by mouth 2 (two) times daily.   No facility-administered encounter medications on file as of 04/18/2016.      Review of Systems  Constitutional:   No  weight loss, night sweats,  Fevers, chills,  +fatigue, or  lassitude.  HEENT:   No headaches,  Difficulty swallowing,  Tooth/dental problems, or  Sore throat,                No sneezing, itching, ear ache, nasal congestion, post nasal drip,   CV:  No chest pain,  Orthopnea, PND, swelling in lower extremities, anasarca, dizziness, palpitations, syncope.   GI  No heartburn, indigestion, abdominal pain, nausea, vomiting, diarrhea, change in bowel habits, loss of appetite, bloody stools.   Resp: No shortness of breath with exertion or at rest.  No excess mucus, no productive cough,  No non-productive cough,  No coughing up of blood.  No change in color of mucus.  No wheezing.  No chest wall deformity  Skin: no rash or lesions.  GU: no dysuria, change in color of urine, no urgency or frequency.  No flank pain, no hematuria   MS:  No joint pain or swelling.  No decreased range of motion.  No back pain.    Physical Exam  BP 112/70 (BP Location: Left Arm, Patient Position: Sitting, Cuff Size: Normal)   Pulse 74   Ht 5' (1.524 m)   Wt 113 lb (51.3 kg)   LMP  (LMP Unknown)   SpO2 98%   BMI 22.07 kg/m   GEN: A/Ox3; pleasant , NAD, well nourished    HEENT:  [place]/AT,  EACs-clear, TMs-wnl, NOSE-clear, THROAT-clear, no lesions, no postnasal drip or exudate noted.   NECK:  Supple w/ fair ROM; no JVD; normal carotid impulses w/o bruits; no thyromegaly or nodules palpated; no lymphadenopathy.    RESP  Clear  P & A; w/o, wheezes/ rales/ or rhonchi. no accessory muscle use, no dullness to percussion  CARD:   RRR, no m/r/g, no peripheral edema, pulses intact, no cyanosis or clubbing.  GI:   Soft & nt; nml bowel sounds; no organomegaly or masses detected.   Musco: Warm bil, no deformities or joint swelling noted.   Neuro: alert, no focal deficits noted.    Skin: Warm, no lesions or rashes   Lab Results:  CBC  BNP No results found for: BNP  ProBNP No results found for: PROBNP  Imaging: No results found.   Assessment & Plan:   Idiopathic hypersomnia Worsening sx - change provigil to nuvigil  Healthy sleep regimen  Cont to follow with psych for depression/mood disorder   Plan  Patient Instructions  May change to Nuvigil 150mg  daily .  Try to stay active  Healthy sleep regimen  Follow up with Dr. Halford Chessman  In 3 months and As needed             Rexene Edison, NP 04/18/2016

## 2016-04-18 NOTE — Assessment & Plan Note (Signed)
Worsening sx - change provigil to nuvigil  Healthy sleep regimen  Cont to follow with psych for depression/mood disorder   Plan  Patient Instructions  May change to Nuvigil 150mg  daily .  Try to stay active  Healthy sleep regimen  Follow up with Dr. Halford Chessman  In 3 months and As needed

## 2016-04-18 NOTE — Progress Notes (Signed)
I have reviewed and agree with assessment/plan.  [name], MD [facility] Pulmonary/Critical Care 04/18/2016, 6:36 PM Pager:  [phone]

## 2016-04-18 NOTE — Patient Instructions (Addendum)
May change to Nuvigil 150mg  daily .  Try to stay active  Healthy sleep regimen  Follow up with Dr. Halford Chessman  In 3 months and As needed

## 2016-04-20 DIAGNOSIS — Z96642 Presence of left artificial hip joint: Secondary | ICD-10-CM | POA: Diagnosis not present

## 2016-04-20 DIAGNOSIS — Z471 Aftercare following joint replacement surgery: Secondary | ICD-10-CM | POA: Diagnosis not present

## 2016-04-21 ENCOUNTER — Telehealth: Payer: Self-pay | Admitting: Adult Health

## 2016-04-21 MED ORDER — ARMODAFINIL 150 MG PO TABS: 150.0000 mg | ORAL_TABLET | Freq: Every day | ORAL | 0 refills | Status: DC

## 2016-04-21 NOTE — Telephone Encounter (Signed)
Called [facility] and made sure Rx was cancelled; called Rx [facility]. Pt is aware and nothing more needed at this time.

## 2016-04-26 ENCOUNTER — Telehealth: Payer: Self-pay | Admitting: Adult Health

## 2016-04-26 NOTE — Telephone Encounter (Signed)
The medication was denied. Per covermymeds.com: We denied this request under Medicare Part D because: The information provided by your prescriber did not meet the requirements for covering this medication (prior authorization). Your plan does not allow coverage of this medication based on the answers provided by your prescriber to the following questions: Does the patient have a diagnosis of narcolepsy confirmed by sleep lab evaluation? Does the patient have a diagnosis of Shift Work Disorder (SWD)? Does the patient have a diagnosis of obstructive sleep apnea (OSA) confirmed by polysomnography?  TP please advise if you would like to start the appeal process or change medication.    Will route to Jess to follow.

## 2016-04-26 NOTE — Telephone Encounter (Signed)
PA for Armodafinil 150MG  tablets was inititated via covermymeds.com using key [id number]. Information was sent to plan and decision will be made in 1-3 days.

## 2016-05-02 NOTE — Telephone Encounter (Signed)
Insurance will not cover Nuvigil . Appeal was denies  Will need to resume Provigil 200mg  daily  If not working will need to return to see . Dr. Halford Chessman  To discuss.

## 2016-05-02 NOTE — Telephone Encounter (Signed)
LMTCB

## 2016-05-03 NOTE — Telephone Encounter (Signed)
Spoke with pt and made her aware of previous message by TP. OV with VS has been made, she had no further questions at this time.

## 2016-05-16 ENCOUNTER — Encounter: Payer: Self-pay | Admitting: Pulmonary Disease

## 2016-05-16 ENCOUNTER — Ambulatory Visit (INDEPENDENT_AMBULATORY_CARE_PROVIDER_SITE_OTHER): Payer: Medicare Other | Admitting: Pulmonary Disease

## 2016-05-16 VITALS — BP 144/82 | HR 69 | Ht 60.0 in | Wt 112.0 lb

## 2016-05-16 DIAGNOSIS — I2583 Coronary atherosclerosis due to lipid rich plaque: Secondary | ICD-10-CM

## 2016-05-16 DIAGNOSIS — I2510 Atherosclerotic heart disease of native coronary artery without angina pectoris: Secondary | ICD-10-CM | POA: Diagnosis not present

## 2016-05-16 DIAGNOSIS — G47419 Narcolepsy without cataplexy: Secondary | ICD-10-CM

## 2016-05-16 MED ORDER — ARMODAFINIL 150 MG PO TABS: 150.0000 mg | ORAL_TABLET | Freq: Every day | ORAL | 0 refills | Status: DC

## 2016-05-16 NOTE — Patient Instructions (Signed)
Continue nuvigil  Follow up in 3 months

## 2016-05-16 NOTE — Progress Notes (Signed)
Chief Complaint  Patient presents with  . Follow-up    Discuss medications - last seen by TP and was changed to Nuvigil. Pt states that Nuvigil seems to help better than the Provigil but is not resolving the excess sleepiness during the daytime.     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  I last saw her in 2015.  She was getting refills for provigil from her psychiatrist.  She felt that provigil wasn't working as well.  She was seen by Rexene Edison and switched to nuvigil.  She feels this is working better.    She goes to bed at midnight.  She wakes up once to get something to eat.  She gets out of bed at 8 am.  She doesn't usually have to nap during the day unless she does a lot of yard work.    She had hip surgery in February.  She is slowly getting back into her usual activity level.  Her depression is doing better.  She is now only on prozac.  Physical exam   General - pleasant Eyes - pupils reactive ENT - no sinus tenderness, no oral exudate, no LAN Cardiac - regular, no murmur Chest - no wheeze, rales Abd - soft, non tender Ext - no edema Skin - no rashes Neuro - normal strength Psych - normal mood  Assessment/plan:  Narcolepsy w/o cataplexy. - will continue nuvigil 150 mg daily - discussed maintaining regular sleep/wake schedule - reviewed proper sleep hygiene - will decide at next visit if she should try rotating stimulant medication regimen  Bipolar with depression. - she is followed by Dr.  Clovis Pu   Patient Instructions  Continue nuvigil  Follow up in 3 months   [name], MD [place] Pulmonary/Critical Care/Sleep Pager:  [phone] 05/16/2016, 2:34 PM

## 2016-06-13 NOTE — Addendum Note (Signed)
Addendum  created 06/13/16 1120 by Myrtie Soman, MD   Sign clinical note

## 2016-08-11 ENCOUNTER — Other Ambulatory Visit: Payer: Self-pay | Admitting: Physician Assistant

## 2016-08-11 DIAGNOSIS — E785 Hyperlipidemia, unspecified: Secondary | ICD-10-CM

## 2016-08-11 DIAGNOSIS — I2510 Atherosclerotic heart disease of native coronary artery without angina pectoris: Secondary | ICD-10-CM

## 2016-08-11 DIAGNOSIS — I10 Essential (primary) hypertension: Secondary | ICD-10-CM

## 2016-08-11 NOTE — Telephone Encounter (Signed)
This is Dr. Kelly's pt. °

## 2016-08-12 MED ORDER — IRBESARTAN 150 MG PO TABS: 150.0000 mg | ORAL_TABLET | Freq: Every day | ORAL | 6 refills | Status: DC

## 2016-08-12 NOTE — Telephone Encounter (Signed)
Pt with hypertension, no dx of CHF.  Will switch valsartan 160 mg qd to irbesartan 150 mg qd.   LMOM for patient, explaining switch, she can call with questions or [facility]

## 2016-08-18 ENCOUNTER — Encounter: Payer: Self-pay | Admitting: Pulmonary Disease

## 2016-08-18 ENCOUNTER — Ambulatory Visit (INDEPENDENT_AMBULATORY_CARE_PROVIDER_SITE_OTHER): Payer: Medicare Other | Admitting: Pulmonary Disease

## 2016-08-18 VITALS — BP 144/80 | HR 65 | Ht 60.0 in | Wt 110.4 lb

## 2016-08-18 DIAGNOSIS — G47419 Narcolepsy without cataplexy: Secondary | ICD-10-CM

## 2016-08-18 DIAGNOSIS — I2583 Coronary atherosclerosis due to lipid rich plaque: Secondary | ICD-10-CM | POA: Diagnosis not present

## 2016-08-18 DIAGNOSIS — I2510 Atherosclerotic heart disease of native coronary artery without angina pectoris: Secondary | ICD-10-CM | POA: Diagnosis not present

## 2016-08-18 MED ORDER — ARMODAFINIL 250 MG PO TABS: 250.0000 mg | ORAL_TABLET | Freq: Every day | ORAL | 5 refills | Status: DC

## 2016-08-18 NOTE — Progress Notes (Signed)
Chief Complaint  Patient presents with  . Follow-up    Nuvigil does not work.Discuss other meds. today.Sleepiness is slight improvement but not great.    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  She has been taking nuvigil 150 mg in the morning.  She is still struggling to stay awake during the day.  She tried adderall before, but this caused more trouble with impulsiveness and mania.  She feels her mood has been good recently.  She is better since her work related stress went away after retiring.  BP slightly up today.  She was on valsartan >> changed to recent carcinogenic risk.  She hasn't started new BP med yet.  Physical exam  General - pleasant Eyes - pupils reactive ENT - no sinus tenderness, no oral exudate, no LAN Cardiac - regular, no murmur Chest - no wheeze, rales Abd - soft, non tender Ext - no edema Skin - no rashes Neuro - normal strength Psych - normal mood   Assessment/plan  Narcolepsy w/o cataplexy. - will increase nuvigil to 250 mg daily - if symptoms persist, then add afternoon dose  Bipolar with depression. - she is followed by Dr. Clovis Pu   Patient Instructions  Change nuvigil to 250 mg daily in the morning  Call in few weeks if you are still having daytime sleepiness   Follow up in 6 months   [name], MD [place] Pulmonary/Critical Care/Sleep Pager:  [phone] 08/18/2016, 12:27 PM

## 2016-08-18 NOTE — Patient Instructions (Signed)
Change nuvigil to 250 mg daily in the morning  Call in few weeks if you are still having daytime sleepiness   Follow up in 6 months

## 2016-08-22 DIAGNOSIS — L719 Rosacea, unspecified: Secondary | ICD-10-CM | POA: Diagnosis not present

## 2016-08-22 DIAGNOSIS — F322 Major depressive disorder, single episode, severe without psychotic features: Secondary | ICD-10-CM | POA: Diagnosis not present

## 2016-09-16 DIAGNOSIS — R21 Rash and other nonspecific skin eruption: Secondary | ICD-10-CM | POA: Diagnosis not present

## 2016-09-16 DIAGNOSIS — J309 Allergic rhinitis, unspecified: Secondary | ICD-10-CM | POA: Diagnosis not present

## 2016-09-19 ENCOUNTER — Telehealth: Payer: Self-pay | Admitting: Cardiovascular Disease

## 2016-09-19 NOTE — Telephone Encounter (Signed)
Left msg to call.

## 2016-09-19 NOTE — Telephone Encounter (Signed)
Have her increase irbesartan to 300 mg daily, check home BP x 2 weeks.  She can call those results in, if still concerning, we can then schedule appt with PharmD

## 2016-09-19 NOTE — Telephone Encounter (Signed)
Pt of Dr. Claiborne Billings Had med change - valsartan was d/c'ed 1 month ago, she started irbesartan 150mg  daily.  she notes she hadn't really checked BPs until she went to allergist last week and noted higher readings in 160s.  States "what may have contributed to that is that I take nuvigil for ideopathic hypersomnia". States that in fact, recently her nuvigil dose was increased. she's aware this med can cause HTN as a SE. she didn't take the nuvigil for a few days once she noticed her BPs were running in the 160s, but states BP "still kind of high".  she was obtaining more recent readings of 150s/80s.   She's experienced "a little bit of lightheadedness" and fatigue in morning but aware some of this may be due to her holding the nuvigil.  Informed patient I would seek recommendations & follow up. Would it be advised to increase her irbesartan dosing and follow BP trends?

## 2016-09-19 NOTE — Telephone Encounter (Signed)
New Message     Pt c/o medication issue:  1. Name of Medication:  irbesartan (AVAPRO) 150 MG tablet Take 1 tablet (150 mg total) by mouth daily.     2. How are you currently taking this medication (dosage and times per day)? 1 tablet daily   3. Are you having a reaction (difficulty breathing--STAT)? no 4. What is your medication issue?  Its not controlling her bp  163/81  163/77

## 2016-09-20 ENCOUNTER — Encounter: Payer: Self-pay | Admitting: Family Medicine

## 2016-09-20 ENCOUNTER — Ambulatory Visit (INDEPENDENT_AMBULATORY_CARE_PROVIDER_SITE_OTHER): Payer: Medicare Other | Admitting: Family Medicine

## 2016-09-20 VITALS — BP 152/100 | HR 88 | Temp 98.2°F | Ht 59.0 in | Wt 109.8 lb

## 2016-09-20 DIAGNOSIS — I2583 Coronary atherosclerosis due to lipid rich plaque: Secondary | ICD-10-CM

## 2016-09-20 DIAGNOSIS — Z7689 Persons encountering health services in other specified circumstances: Secondary | ICD-10-CM | POA: Diagnosis not present

## 2016-09-20 DIAGNOSIS — I10 Essential (primary) hypertension: Secondary | ICD-10-CM

## 2016-09-20 DIAGNOSIS — I2510 Atherosclerotic heart disease of native coronary artery without angina pectoris: Secondary | ICD-10-CM

## 2016-09-20 DIAGNOSIS — R5383 Other fatigue: Secondary | ICD-10-CM

## 2016-09-20 DIAGNOSIS — E559 Vitamin D deficiency, unspecified: Secondary | ICD-10-CM

## 2016-09-20 DIAGNOSIS — G4711 Idiopathic hypersomnia with long sleep time: Secondary | ICD-10-CM

## 2016-09-20 DIAGNOSIS — F329 Major depressive disorder, single episode, unspecified: Secondary | ICD-10-CM | POA: Diagnosis not present

## 2016-09-20 DIAGNOSIS — F319 Bipolar disorder, unspecified: Secondary | ICD-10-CM

## 2016-09-20 NOTE — Progress Notes (Addendum)
Patient presents to clinic today to establish care also concerned about elevated bp.  SUBJECTIVE: PMH: Pt is a 66 yo CF with pmh sig for hypersomnia, HTN, CAD, bipolar d/o.    Idiopathic hypersomnia: -followed by pulm. Dr. [name] -sleep studies x2.  States did not have enough REM sleep to call it narcolepsy -Tried adderall in the past, but caused behavior issues -now on Nuvigil 250mg  daily  Fatigue: -concerned may be something other than affect of hypersomnia -states is too tired to exercise, but wants to do wt bearing exercise.  HTN -was on Valsartan, but d/c'd after manufacturing concern -Now on Irbesartan 150mg   -followed by Cardiology, Dr. Claiborne Billings -home bp readings: 153/81, 145/86, 150/93, 134/93, 151/82.  Has home machine at visit. -elevated reading in office today -trying to decrease sodium intake.  Difficult to exercise 2/2 somnolence  CAD -h/o coronary bypass -recent stress test Jan 2018 -followed by Cards, Dr. Claiborne Billings  Bipolar d/o -followed by Dr. Clovis Pu -Prozac 20 mg daily -denies depressed mood.  Endorses being compulsive at times.  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 Merrilee Seashore at [facility] -Has difficulty not touching face. -Also has eczema.  [place] 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    Health Maintenance Immunizations -- interested in influenza.  Has never had chicken pox, not interested in shingles vac.  Never had pneumonia vac. Colonoscopy --2017 Mammogram --2017 PAP -- 2016.  States does not want to have any more paps. Bone Density --  Few yrs ago  FMHx: Mom: heart dz., emotional/mental illness, HTN, Kidney dz. aneurysm Dad: Lung cancer, desc at 55  (worked as a Theme park manager) MGF: EtOHism,  MGM: osteoarthritis, Heart dz.,  PGPs: elevated cholesterol  Past Medical History:  Diagnosis Date  . Arthritis     . Bipolar 1 disorder ([place])   . CAD (coronary artery disease) 2007   CABG X 1  . Depression    controlled  . Hay fever   . Headache    Hx of migraines  . Heart murmur   . Hyperlipidemia   . Hypertension   . Idiopathic hypersomnia   . Mitral valve prolapse   . PONV (postoperative nausea and vomiting)   . Urinary incontinence     Past Surgical History:  Procedure Laterality Date  . cataract surgery     BIL  . CORONARY ARTERY BYPASS GRAFT  03/21/05   off pump LIMA-LAD  . myomectomy  1981  . reconstructive surgery  1945   lip   . [place]   3 times  . SHOULDER ARTHROSCOPY  2006  . TOTAL ABDOMINAL HYSTERECTOMY  1991  . TOTAL HIP ARTHROPLASTY Left 03/01/2016   Procedure: LEFT TOTAL HIP ARTHROPLASTY ANTERIOR APPROACH;  Surgeon: Paralee Cancel, MD;  Location: [facility];  Service: Orthopedics;  Laterality: Left;  Requests 70 mins  . trach    . VESICOVAGINAL FISTULA CLOSURE W/ TAH      Current Outpatient Prescriptions on File Prior to Visit  Medication Sig Dispense Refill  . acetaminophen (TYLENOL) 650 MG CR tablet Take 650 mg by mouth every 8 (eight) hours as needed for pain.    . Armodafinil 250 MG tablet Take 1 tablet (250 mg total) by mouth daily. 30 tablet 5  . Calcium Carb-Cholecalciferol (CALCIUM 500+D) 500-400 MG-UNIT TABS Take 2 tablets by mouth daily.    Marland Kitchen  famotidine (PEPCID) 20 MG tablet Take 20 mg by mouth daily.    Marland Kitchen FLUoxetine (PROZAC) 20 MG tablet Take 20 mg by mouth daily.    . fluticasone (FLONASE) 50 MCG/ACT nasal spray Place 1 spray into both nostrils daily as needed for allergies or rhinitis.    Marland Kitchen irbesartan (AVAPRO) 150 MG tablet Take 1 tablet (150 mg total) by mouth daily. 30 tablet 6  . Multiple Vitamins-Minerals (MULTIPLE VITAMINS/WOMENS) tablet Take 1 tablet by mouth daily.    . naproxen (NAPROSYN) 250 MG tablet Take 250 mg by mouth 1 day or 1 dose.    . rosuvastatin (CRESTOR) 20 MG tablet TAKE 1 TABLET (20 MG TOTAL) BY MOUTH DAILY. (Patient  taking differently: Take 20 mg by mouth every evening. ) 90 tablet 2   No current facility-administered medications on file prior to visit.     Allergies  Allergen Reactions  . Penicillins Anaphylaxis    Has patient had a PCN reaction causing immediate rash, facial/tongue/throat swelling, SOB or lightheadedness with hypotension: Yes Has patient had a PCN reaction causing severe rash involving mucus membranes or skin necrosis: No Has patient had a PCN reaction that required hospitalization Yes Has patient had a PCN reaction occurring within the last 10 years: No If all of the above answers are "NO", then may proceed with Cephalosporin use.  Marland Kitchen Bextra [Valdecoxib] Hives  . Codeine Nausea And Vomiting  . Lactose Intolerance (Gi) Diarrhea  . Meperidine Hcl Nausea And Vomiting  . Morphine Nausea And Vomiting  . Pentazocine Lactate     Muscle spasms     Family History  Problem Relation Age of Onset  . Stroke Mother        died 52  . Aneurysm Mother   . Hypertension Mother   . Kidney disease Mother   . Bipolar disorder Mother   . Lung cancer Father        died 47  . Bipolar disorder Unknown   . Hypertension Unknown   . Osteoporosis Maternal Grandmother   . Arthritis Maternal Grandmother   . Alcohol abuse Maternal Grandfather     Social History   Social History  . Marital status: Divorced    Spouse name: N/A  . Number of children: N/A  . Years of education: N/A   Occupational History  . retired    Social History Main Topics  . Smoking status: Never Smoker  . Smokeless tobacco: Never Used  . Alcohol use No  . Drug use: No  . Sexual activity: Yes   Other Topics Concern  . Not on file   Social History Narrative  . No narrative on file    ROS  General: Denies fever, chills, night sweats, changes in weight, changes in appetite  +fatigue HEENT: Denies headaches, ear pain, changes in vision, rhinorrhea, sore throat CV: Denies CP, palpitations, SOB, orthopnea Pulm:  Denies SOB, cough, wheezing GI: Denies abdominal pain, nausea, vomiting, diarrhea, constipation GU: Denies dysuria, hematuria, frequency, vaginal discharge Msk: Denies muscle cramps, joint pains Neuro: Denies weakness, numbness, tingling Skin: Denies rashes, bruising  +rash, eczema Psych: Denies depression, anxiety, hallucinations   BP (!) 152/100 (BP Location: Left Arm, Patient Position: Sitting)   Pulse 88   Temp 98.2 F (36.8 C) (Oral)   Ht 4\' 11"  (1.499 m)   Wt 109 lb 12.8 oz (49.8 kg)   LMP 01/10/1989 (Approximate)   SpO2 97%   BMI 22.18 kg/m   Physical Exam  Gen. Pleasant, well developed, thin,in  NAD HEENT - [place]/AT, PERRL, EOMI, conjunctive clear, no scleral icterus, no nasal drainage, pharynx without erythema or exudate. Neck: No JVD, no thyromegaly, no carotid bruits Lungs: no accessory muscle use, CTAB, no wheezes, rales or rhonchi Cardiovascular: RRR, No r/g/m, no peripheral edema Abdomen: BS present, soft, nontender,nondistended, no hepatosplenomegaly Musculoskeletal: No deformities, moves all four extremities, no cyanosis or clubbing, normal tone Neuro:  A&Ox3, CN II-XII intact, normal gait Skin:  Warm, dry, intact, no lesions Psych: normal affect, mood elevated.  No results found for this or any previous visit (from the past 2160 hour(s)).  Assessment/Plan: Essential hypertension -elevated. 164/104  Repeat 152/100 -Increased irbesartan to 300 mg daily.  Per chart review also plan per Cardiology.  If pt continues to have elevated bp in next 2 wks she is to contact Cards for appt. -Continue checking bp at home  - Plan: CBC with Differential/Platelet, Comprehensive metabolic panel, CBC with Differential/Platelet, Comprehensive metabolic panel  Bipolar 1 disorder (HCC) -stable -Followed by Dr. Clovis Pu -on Prozac -mood slightly elevated this visit.   Idiopathic hypersomnia -Followed by Dr. Halford Chessman, Pulmonology -continue uvigil  Coronary artery disease due to  lipid rich plaque  -followed by Cardiology -continue crestor - Plan: Lipid panel  Other fatigue  -likely 2/2 hypersomnia - Plan: TSH, T4, Free, TSH, T4, Free  Vitamin D deficiency  - Plan: Vitamin D, 25-hydroxy, Vitamin D, 25-hydroxy  Establish care with a new doctor -records release

## 2016-09-20 NOTE — Patient Instructions (Addendum)
We have ordered labs at this visit. It can take up to 1-2 weeks for results and processing. If results require follow up or explanation, we will call you with instructions. Clinically stable results will be released to your [facility] or sent to you via mail. If you have not heard from [place] or cannot find your results in [facility] in 2 weeks please contact our office at [phone].   After reviewing your chart, there is a note from your Cardiologist regarding your blood pressure medicine.  They would like for you to increase the dose of Irbesartan to 300mg  daily (that would be taking 2 pills).  You are to do this for 2 weeks and check your blood pressure daily.  If it is still elevated or you are having any concerns you are to call the Cardiologist office to schedule an appointment.   DASH Eating Plan DASH stands for "Dietary Approaches to Stop Hypertension." The DASH eating plan is a healthy eating plan that has been shown to reduce high blood pressure (hypertension). It may also reduce your risk for type 2 diabetes, heart disease, and stroke. The DASH eating plan may also help with weight loss. What are tips for following this plan? General guidelines  Avoid eating more than 2,300 mg (milligrams) of salt (sodium) a day. If you have hypertension, you may need to reduce your sodium intake to 1,500 mg a day.  Limit alcohol intake to no more than 1 drink a day for nonpregnant women and 2 drinks a day for men. One drink equals 12 oz of beer, 5 oz of wine, or 1 oz of hard liquor.  Work with your health care provider to maintain a healthy body weight or to lose weight. Ask what an ideal weight is for you.  Get at least 30 minutes of exercise that causes your heart to beat faster (aerobic exercise) most days of the week. Activities may include walking, swimming, or biking.  Work with your health care provider or diet and nutrition specialist (dietitian) to adjust your eating plan to your individual calorie  needs. Reading food labels  Check food labels for the amount of sodium per serving. Choose foods with less than 5 percent of the Daily Value of sodium. Generally, foods with less than 300 mg of sodium per serving fit into this eating plan.  To find whole grains, look for the word "whole" as the first word in the ingredient list. Shopping  Buy products labeled as "low-sodium" or "no salt added."  Buy fresh foods. Avoid canned foods and premade or frozen meals. Cooking  Avoid adding salt when cooking. Use salt-free seasonings or herbs instead of table salt or sea salt. Check with your health care provider or pharmacist before using salt substitutes.  Do not fry foods. Cook foods using healthy methods such as baking, boiling, grilling, and broiling instead.  Cook with heart-healthy oils, such as olive, canola, soybean, or sunflower oil. Meal planning   Eat a balanced diet that includes: ? 5 or more servings of fruits and vegetables each day. At each meal, try to fill half of your plate with fruits and vegetables. ? Up to 6-8 servings of whole grains each day. ? Less than 6 oz of lean meat, poultry, or fish each day. A 3-oz serving of meat is about the same size as a deck of cards. One egg equals 1 oz. ? 2 servings of low-fat dairy each day. ? A serving of nuts, seeds, or beans 5 times  each week. ? Heart-healthy fats. Healthy fats called Omega-3 fatty acids are found in foods such as flaxseeds and coldwater fish, like sardines, salmon, and mackerel.  Limit how much you eat of the following: ? Canned or prepackaged foods. ? Food that is high in trans fat, such as fried foods. ? Food that is high in saturated fat, such as fatty meat. ? Sweets, desserts, sugary drinks, and other foods with added sugar. ? Full-fat dairy products.  Do not salt foods before eating.  Try to eat at least 2 vegetarian meals each week.  Eat more home-cooked food and less restaurant, buffet, and fast  food.  When eating at a restaurant, ask that your food be prepared with less salt or no salt, if possible. What foods are recommended? The items listed may not be a complete list. Talk with your dietitian about what dietary choices are best for you. Grains Whole-grain or whole-wheat bread. Whole-grain or whole-wheat pasta. Brown rice. [place]. Bulgur. Whole-grain and low-sodium cereals. Pita bread. Low-fat, low-sodium crackers. Whole-wheat flour tortillas. Vegetables Fresh or frozen vegetables (raw, steamed, roasted, or grilled). Low-sodium or reduced-sodium tomato and vegetable juice. Low-sodium or reduced-sodium tomato sauce and tomato paste. Low-sodium or reduced-sodium canned vegetables. Fruits All fresh, dried, or frozen fruit. Canned fruit in natural juice (without added sugar). Meat and other protein foods Skinless chicken or [place]. Ground chicken or [place]. Pork with fat trimmed off. Fish and seafood. Egg whites. Dried beans, peas, or lentils. Unsalted nuts, nut butters, and seeds. Unsalted canned beans. Lean cuts of beef with fat trimmed off. Low-sodium, lean deli meat. Dairy Low-fat (1%) or fat-free (skim) milk. Fat-free, low-fat, or reduced-fat cheeses. Nonfat, low-sodium ricotta or cottage cheese. Low-fat or nonfat yogurt. Low-fat, low-sodium cheese. Fats and oils Soft margarine without trans fats. Vegetable oil. Low-fat, reduced-fat, or light mayonnaise and salad dressings (reduced-sodium). Canola, safflower, olive, soybean, and sunflower oils. Avocado. Seasoning and other foods Herbs. Spices. Seasoning mixes without salt. Unsalted popcorn and pretzels. Fat-free sweets. What foods are not recommended? The items listed may not be a complete list. Talk with your dietitian about what dietary choices are best for you. Grains Baked goods made with fat, such as croissants, muffins, or some breads. Dry pasta or rice meal packs. Vegetables Creamed or fried vegetables. Vegetables  in a cheese sauce. Regular canned vegetables (not low-sodium or reduced-sodium). Regular canned tomato sauce and paste (not low-sodium or reduced-sodium). Regular tomato and vegetable juice (not low-sodium or reduced-sodium). Angie Fava. Olives. Fruits Canned fruit in a light or heavy syrup. Fried fruit. Fruit in cream or butter sauce. Meat and other protein foods Fatty cuts of meat. Ribs. Fried meat. Berniece Salines. Sausage. Bologna and other processed lunch meats. Salami. Fatback. Hotdogs. Bratwurst. Salted nuts and seeds. Canned beans with added salt. Canned or smoked fish. Whole eggs or egg yolks. Chicken or [place] with skin. Dairy Whole or 2% milk, cream, and half-and-half. Whole or full-fat cream cheese. Whole-fat or sweetened yogurt. Full-fat cheese. Nondairy creamers. Whipped toppings. Processed cheese and cheese spreads. Fats and oils Butter. Stick margarine. Lard. Shortening. Ghee. Bacon fat. Tropical oils, such as coconut, palm kernel, or palm oil. Seasoning and other foods Salted popcorn and pretzels. Onion salt, garlic salt, seasoned salt, table salt, and sea salt. Worcestershire sauce. Tartar sauce. Barbecue sauce. Teriyaki sauce. Soy sauce, including reduced-sodium. Steak sauce. Canned and packaged gravies. Fish sauce. Oyster sauce. Cocktail sauce. Horseradish that you find on the shelf. Ketchup. Mustard. Meat flavorings and tenderizers. Bouillon cubes. Hot sauce  and Tabasco sauce. Premade or packaged marinades. Premade or packaged taco seasonings. Relishes. Regular salad dressings. Where to find more information:  National Heart, Lung, and [place]: https://wilson-eaton.com/  American Heart Association: www.heart.org Summary  The DASH eating plan is a healthy eating plan that has been shown to reduce high blood pressure (hypertension). It may also reduce your risk for type 2 diabetes, heart disease, and stroke.  With the DASH eating plan, you should limit salt (sodium) intake to 2,300 mg a  day. If you have hypertension, you may need to reduce your sodium intake to 1,500 mg a day.  When on the DASH eating plan, aim to eat more fresh fruits and vegetables, whole grains, lean proteins, low-fat dairy, and heart-healthy fats.  Work with your health care provider or diet and nutrition specialist (dietitian) to adjust your eating plan to your individual calorie needs. This information is not intended to replace advice given to you by your health care provider. Make sure you discuss any questions you have with your health care provider. Document Released: 12/16/2010 Document Revised: 12/21/2015 Document Reviewed: 12/21/2015 Elsevier Interactive Patient Education  2017 [employer].  How to Take Your Blood Pressure You can take your blood pressure at home with a machine. You may need to check your blood pressure at home:  To check if you have high blood pressure (hypertension).  To check your blood pressure over time.  To make sure your blood pressure medicine is working.  Supplies needed: You will need a blood pressure machine, or monitor. You can buy one at a drugstore or online. When choosing one:  Choose one with an arm cuff.  Choose one that wraps around your upper arm. Only one finger should fit between your arm and the cuff.  Do not choose one that measures your blood pressure from your wrist or finger.  Your doctor can suggest a monitor. How to prepare Avoid these things for 30 minutes before checking your blood pressure:  Drinking caffeine.  Drinking alcohol.  Eating.  Smoking.  Exercising.  Five minutes before checking your blood pressure:  Pee.  Sit in a dining chair. Avoid sitting in a soft couch or armchair.  Be quiet. Do not talk.  How to take your blood pressure Follow the instructions that came with your machine. If you have a digital blood pressure monitor, these may be the instructions: 1. Sit up straight. 2. Place your feet on the floor.  Do not cross your ankles or legs. 3. Rest your left arm at the level of your heart. You may rest it on a table, desk, or chair. 4. Pull up your shirt sleeve. 5. Wrap the blood pressure cuff around the upper part of your left arm. The cuff should be 1 inch (2.5 cm) above your elbow. It is best to wrap the cuff around bare skin. 6. Fit the cuff snugly around your arm. You should be able to place only one finger between the cuff and your arm. 7. Put the cord inside the groove of your elbow. 8. Press the power button. 9. Sit quietly while the cuff fills with air and loses air. 10. Write down the numbers on the screen. 11. Wait 2-3 minutes and then repeat steps 1-10.  What do the numbers mean? Two numbers make up your blood pressure. The first number is called systolic pressure. The second is called diastolic pressure. An example of a blood pressure reading is "120 over 80" (or 120/80). If you are  an adult and do not have a medical condition, use this guide to find out if your blood pressure is normal: Normal  First number: below 120.  Second number: below 80. Elevated  First number: 120-129.  Second number: below 80. Hypertension stage 1  First number: 130-139.  Second number: 80-89. Hypertension stage 2  First number: 140 or above.  Second number: 77 or above. Your blood pressure is above normal even if only the top or bottom number is above normal. Follow these instructions at home:  Check your blood pressure as often as your doctor tells you to.  Take your monitor to your next doctor's appointment. Your doctor will: ? Make sure you are using it correctly. ? Make sure it is working right.  Make sure you understand what your blood pressure numbers should be.  Tell your doctor if your medicines are causing side effects. Contact a doctor if:  Your blood pressure keeps being high. Get help right away if:  Your first blood pressure number is higher than 180.  Your second  blood pressure number is higher than 120. This information is not intended to replace advice given to you by your health care provider. Make sure you discuss any questions you have with your health care provider. Document Released: 12/10/2007 Document Revised: 11/25/2015 Document Reviewed: 06/05/2015 Elsevier Interactive Patient Education  2018 [employer]. Hypersomnia Hypersomnia is when you feel extremely tired during the day even though you're getting plenty of sleep at night. You may need to take naps during the day, and you may also be extremely difficult to wake up when you are sleeping. What are the causes? The cause of your hypersomnia may not be known. Hypersomnia may be caused by:  Medicines.  Sleep disorders, such as narcolepsy.  Trauma or injury to your head or nervous system.  Using drugs or alcohol.  Tumors.  Medical conditions, such as depression or hypothyroidism.  Genetics.  What are the signs or symptoms? The main symptoms of hypersomnia include:  Feeling extremely tired throughout the day.  Being very difficult to wake up.  Sleeping for longer and longer periods.  Taking naps throughout the day.  Other symptoms may include:  Feeling: ? Restless. ? Annoyed. ? Anxious. ? Low energy.  Having difficulty: ? Remembering. ? Speaking. ? Thinking.  Losing your appetite.  Experiencing hallucinations.  How is this diagnosed? Hypersomnia may be diagnosed by:  Medical history and physical exam. This will include a sleep history.  Completing sleep logs.  Tests may also be done, such as: ? Polysomnography. ? Multiple sleep latency test (MSLT).  How is this treated? There is no cure for hypersomnia, but treatment can be very effective in helping manage the condition. Treatment may include:  Lifestyle and sleeping strategies to help cope with the condition.  Stimulant medicines.  Treating any underlying causes of hypersomnia.  Follow these  instructions at home:  Take medicines only as directed by your health care provider.  Schedule short naps for when you feel sleepiest during the day. Tell your employer or teachers that you have hypersomnia. You may be able to adjust your schedule to include time for naps.  Avoid drinking alcohol or caffeinated beverages.  Do not eat a heavy meal before bedtime. Eat at about the same times every day.  Do not drive or operate heavy machinery if you are sleepy.  Do not swim or go out on the water without a life jacket.  If possible, adjust your schedule  so that you do not have to work or be active at night.  Keep all follow-up visits as directed by your health care provider. This is important. Contact a health care provider if:  You have new symptoms.  Your symptoms get worse. Get help right away if: You have serious thoughts of hurting yourself or someone else. This information is not intended to replace advice given to you by your health care provider. Make sure you discuss any questions you have with your health care provider. Document Released: 12/17/2001 Document Revised: 06/04/2015 Document Reviewed: 08/01/2013 Elsevier Interactive Patient Education  [employer].

## 2016-09-21 ENCOUNTER — Telehealth: Payer: Self-pay | Admitting: Pulmonary Disease

## 2016-09-21 LAB — CBC WITH DIFFERENTIAL/PLATELET
BASOS ABS: 0.1 10*3/uL (ref 0.0–0.1)
Basophils Relative: 0.7 % (ref 0.0–3.0)
Eosinophils Absolute: 0 10*3/uL (ref 0.0–0.7)
Eosinophils Relative: 0.4 % (ref 0.0–5.0)
HCT: 43.5 % (ref 36.0–46.0)
Hemoglobin: 14.3 g/dL (ref 12.0–15.0)
LYMPHS ABS: 1.9 10*3/uL (ref 0.7–4.0)
Lymphocytes Relative: 20.9 % (ref 12.0–46.0)
MCHC: 33 g/dL (ref 30.0–36.0)
MCV: 92.3 fl (ref 78.0–100.0)
MONOS PCT: 5.5 % (ref 3.0–12.0)
Monocytes Absolute: 0.5 10*3/uL (ref 0.1–1.0)
NEUTROS PCT: 72.5 % (ref 43.0–77.0)
Neutro Abs: 6.5 10*3/uL (ref 1.4–7.7)
Platelets: 319 10*3/uL (ref 150.0–400.0)
RBC: 4.71 Mil/uL (ref 3.87–5.11)
RDW: 13.8 % (ref 11.5–15.5)
WBC: 8.9 10*3/uL (ref 4.0–10.5)

## 2016-09-21 LAB — COMPREHENSIVE METABOLIC PANEL
ALK PHOS: 104 U/L (ref 39–117)
ALT: 22 U/L (ref 0–35)
AST: 20 U/L (ref 0–37)
Albumin: 4.6 g/dL (ref 3.5–5.2)
BILIRUBIN TOTAL: 0.5 mg/dL (ref 0.2–1.2)
BUN: 29 mg/dL — AB (ref 6–23)
CO2: 28 mEq/L (ref 19–32)
Calcium: 10.1 mg/dL (ref 8.4–10.5)
Chloride: 102 mEq/L (ref 96–112)
Creatinine, Ser: 0.73 mg/dL (ref 0.40–1.20)
GFR: 84.17 mL/min (ref >60.00)
GLUCOSE: 80 mg/dL (ref 70–99)
Potassium: 4.2 mEq/L (ref 3.5–5.1)
SODIUM: 143 meq/L (ref 135–145)
TOTAL PROTEIN: 6.8 g/dL (ref 6.0–8.3)

## 2016-09-21 LAB — LIPID PANEL
Cholesterol: 213 mg/dL — ABNORMAL HIGH (ref 0–200)
HDL: 113.2 mg/dL (ref >39.00)
LDL Cholesterol: 70 mg/dL (ref 0–99)
NONHDL: 99.95
Total CHOL/HDL Ratio: 2
Triglycerides: 150 mg/dL — ABNORMAL HIGH (ref 0.0–149.0)
VLDL: 30 mg/dL (ref 0.0–40.0)

## 2016-09-21 LAB — TSH: TSH: 1.6 u[IU]/mL (ref 0.35–4.50)

## 2016-09-21 LAB — T4, FREE: FREE T4: 0.74 ng/dL (ref 0.60–1.60)

## 2016-09-21 LAB — VITAMIN D 25 HYDROXY (VIT D DEFICIENCY, FRACTURES): VITD: 35.18 ng/mL (ref 30.00–100.00)

## 2016-09-21 NOTE — Telephone Encounter (Signed)
Attempted to call patient but she did not answer. VM was full. Will call back later.

## 2016-09-21 NOTE — Telephone Encounter (Signed)
lm2cb  

## 2016-09-21 NOTE — Telephone Encounter (Signed)
Please send script for provigil 200 mg daily.  Number 30 with 2 refills.

## 2016-09-21 NOTE — Telephone Encounter (Signed)
Spoke with patient. She stated that the Armodafinil is not working for her. She is still having episodes of sleepiness during the day. She feels extremely exhausted. She wants to know if she can go back to taking Provigil. If so, she would like for this RX to be sent to [facility] in [place].   She also wants VS to know that her cardiologist increased her BP medication (irbesartan) to 300mg  last week.   VS, please advise. Thanks!

## 2016-09-22 MED ORDER — MODAFINIL 200 MG PO TABS: 200.0000 mg | ORAL_TABLET | Freq: Every day | ORAL | 2 refills | Status: DC

## 2016-09-22 MED ORDER — IRBESARTAN 300 MG PO TABS: 150.0000 mg | ORAL_TABLET | Freq: Every day | ORAL | 1 refills | Status: DC

## 2016-09-22 NOTE — Telephone Encounter (Signed)
Pt returned Leigh's call.  [phone]

## 2016-09-22 NOTE — Telephone Encounter (Signed)
lmomtcb x 1 for the pt 

## 2016-09-22 NOTE — Telephone Encounter (Signed)
Spoke with pt, aware of recs.   Attempted to call rx in to [facility], they are closed until 10:00 am.  Wcb to call in rx as this cannot be escribed.

## 2016-09-22 NOTE — Telephone Encounter (Signed)
rx called in to [facility].  Nothing further needed.

## 2016-09-22 NOTE — Telephone Encounter (Signed)
S/w pt she states that she went to PCP and they were able to see our message and she increased her irbesartan to 300mg  daily and took for 2 nights and BP is great funning 115/68 HR 63, 114/68 HR 61 and 124/70 HR 64. refill sent to requested pharmacy. F/u appt scheduled

## 2016-10-23 ENCOUNTER — Other Ambulatory Visit: Payer: Self-pay | Admitting: Cardiovascular Disease

## 2016-10-23 DIAGNOSIS — I2510 Atherosclerotic heart disease of native coronary artery without angina pectoris: Secondary | ICD-10-CM

## 2016-10-23 DIAGNOSIS — I10 Essential (primary) hypertension: Secondary | ICD-10-CM

## 2016-10-23 DIAGNOSIS — E785 Hyperlipidemia, unspecified: Secondary | ICD-10-CM

## 2016-10-24 NOTE — Telephone Encounter (Signed)
Rx(s) sent to pharmacy electronically.  

## 2016-12-14 ENCOUNTER — Other Ambulatory Visit: Payer: Self-pay | Admitting: *Deleted

## 2016-12-14 MED ORDER — IRBESARTAN 300 MG PO TABS: 150.0000 mg | ORAL_TABLET | Freq: Every day | ORAL | 0 refills | Status: DC

## 2016-12-29 ENCOUNTER — Ambulatory Visit: Payer: Medicare Other | Admitting: Cardiovascular Disease

## 2017-02-06 DIAGNOSIS — F322 Major depressive disorder, single episode, severe without psychotic features: Secondary | ICD-10-CM | POA: Diagnosis not present

## 2017-03-21 ENCOUNTER — Encounter: Payer: Self-pay | Admitting: Cardiovascular Disease

## 2017-03-21 ENCOUNTER — Ambulatory Visit (INDEPENDENT_AMBULATORY_CARE_PROVIDER_SITE_OTHER): Payer: Medicare Other | Admitting: Cardiovascular Disease

## 2017-03-21 VITALS — BP 142/76 | HR 62 | Ht 60.0 in | Wt 115.6 lb

## 2017-03-21 DIAGNOSIS — E785 Hyperlipidemia, unspecified: Secondary | ICD-10-CM

## 2017-03-21 DIAGNOSIS — I2510 Atherosclerotic heart disease of native coronary artery without angina pectoris: Secondary | ICD-10-CM

## 2017-03-21 DIAGNOSIS — I2583 Coronary atherosclerosis due to lipid rich plaque: Secondary | ICD-10-CM

## 2017-03-21 DIAGNOSIS — Z79899 Other long term (current) drug therapy: Secondary | ICD-10-CM

## 2017-03-21 DIAGNOSIS — G4711 Idiopathic hypersomnia with long sleep time: Secondary | ICD-10-CM

## 2017-03-21 DIAGNOSIS — I10 Essential (primary) hypertension: Secondary | ICD-10-CM

## 2017-03-21 MED ORDER — IRBESARTAN 300 MG PO TABS: 300.0000 mg | ORAL_TABLET | Freq: Every day | ORAL | 3 refills | Status: DC

## 2017-03-21 MED ORDER — AMLODIPINE BESYLATE 2.5 MG PO TABS: 2.5000 mg | ORAL_TABLET | Freq: Every day | ORAL | 3 refills | Status: DC

## 2017-03-21 NOTE — Progress Notes (Signed)
Patient ID: Brandy [name], female   DOB: 1950/10/19, 67 y.o.   MRN: [id number]             HPI: Brandy [name], is a 67 y.o. female who presents to the office for a 14 month follow-up cardiology evaluation.  Brandy [name] is a 67 years old white female who in 2007 was not having significant symptoms but because of family history for peripheral vascular disease, abdominal aortic aneurysm, and somewhat labile blood pressure a Cardiolite study was done which suggested anterior wall ischemia. Subsequent cardiac catheterization revealing 95% ostial LAD stenosis. She underwent off pump LIMA to LAD bypass surgery by Dr. Roxan Hockey on 03/21/2005. She had initially been followed by Dr. Duke Salvia. I saw her in 2011 area an echo Doppler study in April 2001 revealed an ejection fraction greater than 55%. There was grade 1 diastolic dysfunction. She had mild MR and nuclear perfusion scan was normal.  I had not seen her in over 3 years and she had stopped taking medication for blood pressure.  She recently saw Dr. Milta Deiters and her blood pressure was significantly elevated at 169/ 100.  She resumed taking Diovan and recently has been taking this at 160 mg daily.  She has a history of hyperlipidemia and has been taking Crestor 20 mg.  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 Charlott Holler of psychiatry and has been taking Prozac 20 mg.  She has developed left hip discomfort and needs surgery.  She also has a history of idiopathic hypersomnolence, and has been taking Provigil 200 mg daily for this.  When I saw her in 2017, her exam revealed a 2/6 systolic murmur along the left sternal border.  She was in need for left hip replacement surgery. I recommended that she undergo a Grannis study for preoperative assessment and this was done on 12/08/2015 which revealed normal perfusion and function.  A 2-D echo Doppler study which was done on 01/14/2016 showed an EF of 65-70%.  There was mild grade 1 diastolic  dysfunction.  She had mitral annular calcification with trivial MR.  There was mild TR.  There was no evidence for aortic stenosis.  PA pressure was normal.  I saw her in follow-up in January 2018, and she was given clearance for surgery.  Over the past 14 months, she denies any episodes of chest pain, PND, orthopnea, palpitations, presyncope or syncope.  She has had some blood pressure variation.  Her valsartan was switched irbesartan due to the generic impurities.  Irbesartan was further increased to 300 mg due to recurrent blood pressure elevation  She had been on rosuvastatin, but stopped taking this several months ago due to myalgias.  She has not had repeat laboratory.  She presents for reevaluation.  Past Medical History:  Diagnosis Date  . Arthritis   . Bipolar 1 disorder ([place])   . CAD (coronary artery disease) 2007   CABG X 1  . Depression    controlled  . Hay fever   . Headache    Hx of migraines  . Heart murmur   . Hyperlipidemia   . Hypertension   . Idiopathic hypersomnia   . Mitral valve prolapse   . PONV (postoperative nausea and vomiting)   . Urinary incontinence     Past Surgical History:  Procedure Laterality Date  . cataract surgery     BIL  . CORONARY ARTERY BYPASS GRAFT  03/21/05   off pump LIMA-LAD  . myomectomy  1981  . reconstructive surgery  1945   lip   . [place]   3 times  . SHOULDER ARTHROSCOPY  2006  . TOTAL ABDOMINAL HYSTERECTOMY  1991  . TOTAL HIP ARTHROPLASTY Left 03/01/2016   Procedure: LEFT TOTAL HIP ARTHROPLASTY ANTERIOR APPROACH;  Surgeon: Paralee Cancel, MD;  Location: [facility];  Service: Orthopedics;  Laterality: Left;  Requests 70 mins  . trach    . VESICOVAGINAL FISTULA CLOSURE W/ TAH      Allergies  Allergen Reactions  . Penicillins Anaphylaxis    Has patient had a PCN reaction causing immediate rash, facial/tongue/throat swelling, SOB or lightheadedness with hypotension: Yes Has patient had a PCN reaction causing  severe rash involving mucus membranes or skin necrosis: No Has patient had a PCN reaction that required hospitalization Yes Has patient had a PCN reaction occurring within the last 10 years: No If all of the above answers are "NO", then may proceed with Cephalosporin use.  Marland Kitchen Bextra [Valdecoxib] Hives  . Codeine Nausea And Vomiting  . Lactose Intolerance (Gi) Diarrhea  . Meperidine Hcl Nausea And Vomiting  . Morphine Nausea And Vomiting  . Pentazocine Lactate     Muscle spasms     Current Outpatient Medications  Medication Sig Dispense Refill  . acetaminophen (TYLENOL) 650 MG CR tablet Take 650 mg by mouth every 8 (eight) hours as needed for pain.    . Calcium Carb-Cholecalciferol (CALCIUM 500+D) 500-400 MG-UNIT TABS Take 2 tablets by mouth daily.    . famotidine (PEPCID) 20 MG tablet Take 20 mg by mouth daily.    Marland Kitchen FLUoxetine (PROZAC) 20 MG tablet Take 20 mg by mouth daily.    . irbesartan (AVAPRO) 300 MG tablet Take 1 tablet (300 mg total) by mouth daily. 90 tablet 3  . modafinil (PROVIGIL) 200 MG tablet Take 1 tablet (200 mg total) by mouth daily. 30 tablet 2  . mometasone (ELOCON) 0.1 % cream as needed.    . Multiple Vitamins-Minerals (MULTIPLE VITAMINS/WOMENS) tablet Take 1 tablet by mouth daily.    . naproxen (NAPROSYN) 250 MG tablet Take 250 mg by mouth 1 day or 1 dose.    Marland Kitchen amLODipine (NORVASC) 2.5 MG tablet Take 1 tablet (2.5 mg total) by mouth daily. 90 tablet 3   No current facility-administered medications for this visit.     Social History   Socioeconomic History  . Marital status: Divorced    Spouse name: Not on file  . Number of children: Not on file  . Years of education: Not on file  . Highest education level: Not on file  Social Needs  . Financial resource strain: Not on file  . Food insecurity - worry: Not on file  . Food insecurity - inability: Not on file  . Transportation needs - medical: Not on file  . Transportation needs - non-medical: Not on file    Occupational History  . Occupation: retired  Tobacco Use  . Smoking status: Never Smoker  . Smokeless tobacco: Never Used  Substance and Sexual Activity  . Alcohol use: No  . Drug use: No  . Sexual activity: Yes  Other Topics Concern  . Not on file  Social History Narrative  . Not on file    Family History  Problem Relation Age of Onset  . Stroke Mother        died 20  . Aneurysm Mother   . Hypertension Mother   . Kidney disease Mother   .  Bipolar disorder Mother   . Lung cancer Father        died 51  . Bipolar disorder Unknown   . Hypertension Unknown   . Osteoporosis Maternal Grandmother   . Arthritis Maternal Grandmother   . Alcohol abuse Maternal Grandfather    Socially she is divorced for >25 years. She is now retired from Special educational needs teacher.  She does not have any children. There is no tobacco use. No alcohol use. She does exercise at least 3 days per week  ROS General: Negative; No fevers, chills, or night sweats;  HEENT: Negative; No changes in vision or hearing, sinus congestion, difficulty swallowing Pulmonary: Negative; No cough, wheezing, shortness of breath, hemoptysis Cardiovascular: see HPI GI: Negative; No nausea, vomiting, diarrhea, or abdominal pain GU: Negative; No dysuria, hematuria, or difficulty voiding Musculoskeletal: Positive for left hip degeneration Hematologic/Oncology: Negative; no easy bruising, bleeding Endocrine: Negative; no heat/cold intolerance; no diabetes Neuro: Negative; no changes in balance, headaches Skin: Negative; No rashes or skin lesions Psychiatric: Positive for bipolar disorder felt to be atypical. Sleep: Negative; No snoring, daytime sleepiness, hypersomnolence, bruxism, restless legs, hypnogognic hallucinations, no cataplexy Other comprehensive 14 point system review is negative.   PE BP (!) 142/76   Pulse 62   Ht 5' (1.524 m)   Wt 115 lb 9.6 oz (52.4 kg)   LMP 01/10/1989 (Approximate)   BMI 22.58 kg/m     Repeat blood pressure by me was 130/78 supine and was 142/80 standing  Wt Readings from Last 3 Encounters:  03/21/17 115 lb 9.6 oz (52.4 kg)  09/20/16 109 lb 12.8 oz (49.8 kg)  08/18/16 110 lb 6.4 oz (50.1 kg)   General: Alert, oriented, no distress.  Skin: normal turgor, no rashes, warm and dry HEENT: Normocephalic, atraumatic. Pupils equal round and reactive to light; sclera anicteric; extraocular muscles intact;  Nose without nasal septal hypertrophy Mouth/Parynx benign; Mallinpatti scale 2 Neck: No JVD, no carotid bruits; normal carotid upstroke Lungs: clear to ausculatation and percussion; no wheezing or rales Chest wall: without tenderness to palpitation Heart: PMI not displaced, RRR, s1 s2 normal, 2/6 systolic murmur, no diastolic murmur, no rubs, gallops, thrills, or heaves Abdomen: soft, nontender; no hepatosplenomehaly, BS+; abdominal aorta nontender and not dilated by palpation. Back: no CVA tenderness Pulses 2+ Musculoskeletal: full range of motion, normal strength, no joint deformities Extremities: no clubbing cyanosis or edema, Homan's sign negative  Neurologic: grossly nonfocal; Cranial nerves grossly wnl Psychologic: Normal mood and affect   ECG (independently read by me): Sinus rhythm at 62  bpm.  Normal intervals.  No ST segment changes.  No ectopy.  January 2018 ECG (independently read by me): Normal sinus rhythm at 75 bpm.  No ST segment changes.  QTc interval normal at 422 Brandy.  No ectopy.  November 2017 ECG (independently read by me): Normal sinus rhythm at 67 bpm.  Normal intervals.  No significant ST-T changes.  November 2014 ECG: Sinus rhythm at 64 beats per minute. QTc interval 412 Brandy. Agnostic Q-wave in lead III and F  LABS:  BMP Latest Ref Rng & Units 09/20/2016 03/02/2016 02/23/2016  Glucose 70 - 99 mg/dL 80 137(H) 97  BUN 6 - 23 mg/dL 29(H) 17 26(H)  Creatinine 0.40 - 1.20 mg/dL 0.73 0.74 0.73  Sodium 135 - 145 mEq/L 143 142 141  Potassium 3.5 -  5.1 mEq/L 4.2 4.4 4.4  Chloride 96 - 112 mEq/L 102 111 110  CO2 19 - 32 mEq/L 28 28 24  Calcium 8.4 - 10.5 mg/dL 10.1 8.5(L) 9.3   Hepatic Function Latest Ref Rng & Units 09/20/2016 08/11/2015  Total Protein 6.0 - 8.3 g/dL 6.8 6.4  Albumin 3.5 - 5.2 g/dL 4.6 4.2  AST 0 - 37 U/L 20 21  ALT 0 - 35 U/L 22 12  Alk Phosphatase 39 - 117 U/L 104 91  Total Bilirubin 0.2 - 1.2 mg/dL 0.5 0.5   CBC Latest Ref Rng & Units 09/20/2016 03/02/2016 02/23/2016  WBC 4.0 - 10.5 K/uL 8.9 10.7(H) 6.3  Hemoglobin 12.0 - 15.0 g/dL 14.3 9.6(L) 13.1  Hematocrit 36.0 - 46.0 % 43.5 30.3(L) 40.0  Platelets 150.0 - 400.0 K/uL 319.0 215 306   Lab Results  Component Value Date   MCV 92.3 09/20/2016   MCV 89.1 03/02/2016   MCV 88.1 02/23/2016   Lab Results  Component Value Date   TSH 1.60 09/20/2016   Lipid Panel     Component Value Date/Time   CHOL 213 (H) 09/20/2016 1644   TRIG 150.0 (H) 09/20/2016 1644   HDL 113.20 09/20/2016 1644   CHOLHDL 2 09/20/2016 1644   VLDL 30.0 09/20/2016 1644   LDLCALC 70 09/20/2016 1644    RADIOLOGY: No results found.  IMPRESSION:  1. Coronary artery disease due to lipid rich plaque   2. Essential hypertension   3. Hyperlipidemia with target LDL less than 70   4. Medication management   5. Idiopathic hypersomnia     ASSESSMENT AND PLAN: Brandy Stuckert is a 67 year old Caucasian female who underwent CABG revascularization surgery in 2007 after cardiac catheterization revealed 95% ostial LAD stenosis encroaching upon the left main. She underwent off-pump LIMA to LAD bypass. Prior to her surgery, she was completely asymptomatic and anterior ischemia was noted routinely on Cardiolite imaging.  Her last nuclear perfusion study was in November 2017 which continued to show normal perfusion.  She has normal to hyperdynamic ejection fraction and mitral annular calcification and there was no evidence for aortic stenosis.  There was mild MR and TR.  Her blood pressure today is elevated  despite taking irbesartan 300 mg.  I am adding amlodipine 2.5 mg for more optimal blood pressure control with target less than 130/80.  She has a history of hyperlipidemia and stopped taking rosuvastatin.  Fasting laboratory will be obtained.  Target LDL is less than 70.  When last checked on rosuvastatin in September her LDL was 70.  He to reconsider initiation of treatment.  If elevated.  She continues to take Provigil for idiopathic hypersomnia.  She tolerated her hip surgery without cardiovascular compromise.  I will see her in 6 months for reevaluation.  Time spent: 25 minutes Troy Sine, MD, [facility]  03/27/2017 5:46 PM

## 2017-03-21 NOTE — Patient Instructions (Signed)
Medication Instructions:  START amlodipine 2.5 mg daily   Labwork: Please return for FASTING labs (CMET, CBC, Lipid, TSH)  Our in office lab hours are Monday-Friday 8:00-4:00, closed for lunch 12:45-1:45 pm.  No appointment needed.  Follow-Up: Your physician wants you to follow-up in: 6 months with Dr. Claiborne Billings. You will receive a reminder letter in the mail two months in advance. If you don't receive a letter, please call our office to schedule the follow-up appointment.   Any Other Special Instructions Will Be Listed Below (If Applicable).     If you need a refill on your cardiac medications before your next appointment, please call your pharmacy.

## 2017-03-27 ENCOUNTER — Encounter: Payer: Self-pay | Admitting: Cardiovascular Disease

## 2017-03-29 DIAGNOSIS — M25552 Pain in left hip: Secondary | ICD-10-CM | POA: Diagnosis not present

## 2017-03-29 DIAGNOSIS — M7062 Trochanteric bursitis, left hip: Secondary | ICD-10-CM | POA: Diagnosis not present

## 2017-03-29 DIAGNOSIS — G8929 Other chronic pain: Secondary | ICD-10-CM | POA: Insufficient documentation

## 2017-03-29 DIAGNOSIS — Z96642 Presence of left artificial hip joint: Secondary | ICD-10-CM | POA: Diagnosis not present

## 2017-04-06 ENCOUNTER — Telehealth: Payer: Self-pay | Admitting: Family Medicine

## 2017-04-06 ENCOUNTER — Telehealth: Payer: Self-pay | Admitting: Pulmonary Disease

## 2017-04-06 ENCOUNTER — Other Ambulatory Visit: Payer: Self-pay | Admitting: Family Medicine

## 2017-04-06 MED ORDER — SCOPOLAMINE 1 MG/3DAYS TD PT72: 1.0000 | MEDICATED_PATCH | TRANSDERMAL | 0 refills | Status: DC

## 2017-04-06 NOTE — Telephone Encounter (Signed)
[facility]opol[facility] to [facility]

## 2017-04-06 NOTE — Telephone Encounter (Signed)
Copied from [place] [id number]. Topic: Quick Communication - See Telephone Encounter >> Apr 06, 2017 11:15 AM Brandy [name] wrote: CRM for notification. See Telephone encounter for: 04/06/17.  Pt says that she is going on a sea cruise and is requesting a ear patch to help prevent nausea, pt says that she has used this before.   [facility] [address];: [phone]

## 2017-04-06 NOTE — Telephone Encounter (Signed)
Called and spoke with pt who stated she is needing a refill of her modafinil 200mg .  Med last refilled for pt 09/22/16, #30 with 2RF.  Pt last seen at our office 08/18/16.  Pt does have an appt scheduled 5/29 for a 62-month follow up.  Dr. Halford Chessman, please advise if you are okay for [place] to refill this med for pt. Thanks!

## 2017-04-06 NOTE — Telephone Encounter (Signed)
Please advise 

## 2017-04-07 MED ORDER — MODAFINIL 200 MG PO TABS: 200.0000 mg | ORAL_TABLET | Freq: Every day | ORAL | 2 refills | Status: DC

## 2017-04-07 NOTE — Telephone Encounter (Signed)
Rx has been printed and placed at VS's area on [address] for him to sign the Rx.  Once Rx has been signed, will place it up front for pt to come pick up.  Attempted to call pt letting her know Rx was ready but unable to reach pt.  Left message for pt to return our call so we can tell her Rx was ready.

## 2017-04-07 NOTE — Telephone Encounter (Signed)
Okay to send refill.  Please arrange for ROV with me.

## 2017-04-10 NOTE — Telephone Encounter (Signed)
Called and spoke to patient. Patient has an appointment scheduled for 06/07/17 at 10:15am. Patient stated she will come pick up the Rx tomorrow morning and made a note to herself. Nothing further is needed at this time.

## 2017-05-12 ENCOUNTER — Ambulatory Visit: Payer: Medicare Other

## 2017-05-12 NOTE — Progress Notes (Deleted)
Subjective:   Brandy [name] is a 67 y.o. female who presents for Medicare Annual Initial preventive examination.  Initial AWV;  Reports health as   Diet  Exercise  Health Maintenance Due  Topic Date Due  . TETANUS/TDAP  01/11/2011  . DEXA SCAN  05/07/2013  . PNA vac Low Risk Adult (1 of 2 - PCV13) 05/07/2013  . MAMMOGRAM  05/04/2017          Objective:     Vitals: LMP 01/10/1989 (Approximate)   There is no height or weight on file to calculate BMI.  Advanced Directives 03/01/2016 03/01/2016 02/23/2016  Does Patient Have a Medical Advance Directive? Yes Yes Yes  Type of Paramedic of Brandy [name];Living will Brandy [name];Living will Living will  Does patient want to make changes to medical advance directive? No - Patient declined - -  Copy of Brandy [name] in Chart? Yes Yes -    Tobacco Social History   Tobacco Use  Smoking Status Never Smoker  Smokeless Tobacco Never Used     Counseling given: Not Answered   Clinical Intake:                       Past Medical History:  Diagnosis Date  . Arthritis   . Bipolar 1 disorder ([place])   . CAD (coronary artery disease) 2007   CABG X 1  . Depression    controlled  . Hay fever   . Headache    Hx of migraines  . Heart murmur   . Hyperlipidemia   . Hypertension   . Idiopathic hypersomnia   . Mitral valve prolapse   . PONV (postoperative nausea and vomiting)   . Urinary incontinence    Past Surgical History:  Procedure Laterality Date  . cataract surgery     BIL  . CORONARY ARTERY BYPASS GRAFT  03/21/05   off pump LIMA-LAD  . myomectomy  1981  . reconstructive surgery  1945   lip   . Brandy [name]   3 times  . SHOULDER ARTHROSCOPY  2006  . TOTAL ABDOMINAL HYSTERECTOMY  1991  . TOTAL HIP ARTHROPLASTY Left 03/01/2016   Procedure: LEFT TOTAL HIP ARTHROPLASTY ANTERIOR APPROACH;  Surgeon: Paralee Cancel, MD;  Location: [facility];   Service: Orthopedics;  Laterality: Left;  Requests 70 mins  . trach    . VESICOVAGINAL FISTULA CLOSURE W/ TAH     Family History  Problem Relation Age of Onset  . Stroke Mother        died 18  . Aneurysm Mother   . Hypertension Mother   . Kidney disease Mother   . Bipolar disorder Mother   . Lung cancer Father        died 71  . Bipolar disorder Unknown   . Hypertension Unknown   . Osteoporosis Maternal Grandmother   . Arthritis Maternal Grandmother   . Alcohol abuse Maternal Grandfather    Social History   Socioeconomic History  . Marital status: Divorced    Spouse name: Not on file  . Number of children: Not on file  . Years of education: Not on file  . Highest education level: Not on file  Occupational History  . Occupation: retired  Scientific laboratory technician  . Financial resource strain: Not on file  . Food insecurity:    Worry: Not on file    Inability: Not on file  . Transportation needs:  Medical: Not on file    Non-medical: Not on file  Tobacco Use  . Smoking status: Never Smoker  . Smokeless tobacco: Never Used  Substance and Sexual Activity  . Alcohol use: No  . Drug use: No  . Sexual activity: Yes  Lifestyle  . Physical activity:    Days per week: Not on file    Minutes per session: Not on file  . Stress: Not on file  Relationships  . Social connections:    Talks on phone: Not on file    Gets together: Not on file    Attends religious service: Not on file    Active member of club or organization: Not on file    Attends meetings of clubs or organizations: Not on file    Relationship status: Not on file  Other Topics Concern  . Not on file  Social History Narrative  . Not on file    Outpatient Encounter Medications as of 05/16/2017  Medication Sig  . acetaminophen (TYLENOL) 650 MG CR tablet Take 650 mg by mouth every 8 (eight) hours as needed for pain.  Marland Kitchen amLODipine (NORVASC) 2.5 MG tablet Take 1 tablet (2.5 mg total) by mouth daily.  . Calcium  Carb-Cholecalciferol (CALCIUM 500+D) 500-400 MG-UNIT TABS Take 2 tablets by mouth daily.  . famotidine (PEPCID) 20 MG tablet Take 20 mg by mouth daily.  Marland Kitchen FLUoxetine (PROZAC) 20 MG tablet Take 20 mg by mouth daily.  . irbesartan (AVAPRO) 300 MG tablet Take 1 tablet (300 mg total) by mouth daily.  . modafinil (PROVIGIL) 200 MG tablet Take 1 tablet (200 mg total) by mouth daily.  . mometasone (ELOCON) 0.1 % cream as needed.  . Multiple Vitamins-Minerals (MULTIPLE VITAMINS/WOMENS) tablet Take 1 tablet by mouth daily.  . naproxen (NAPROSYN) 250 MG tablet Take 250 mg by mouth 1 day or 1 dose.  . scopolamine (TRANSDERM-SCOP, 1.5 MG,) 1 MG/3DAYS Place 1 patch (1.5 mg total) onto the skin every 3 (three) days.   No facility-administered encounter medications on file as of 05/16/2017.     Activities of Daily Living In your present state of health, do you have any difficulty performing the following activities: 09/20/2016  Hearing? N  Vision? N  Difficulty concentrating or making decisions? N  Walking or climbing stairs? N  Dressing or bathing? N  Doing errands, shopping? N  Some recent data might be hidden    Patient Care Team: Billie Ruddy, MD as PCP - General (Family Medicine)    Assessment:   This is a routine wellness examination for Brandy [name].  Exercise Activities and Dietary recommendations    Goals    None      Fall Risk Fall Risk  09/20/2016  Falls in the past year? No   Is the patient's home free of loose throw rugs in walkways, pet beds, electrical cords, etc?   {Blank single:19197::"yes","no"}      Grab bars in the bathroom? {Blank single:19197::"yes","no"}      Handrails on the stairs?   {Blank single:19197::"yes","no"}      Adequate lighting?   {Blank single:19197::"yes","no"}  Timed Get Up and Go performed: ***  Depression Screen PHQ 2/9 Scores 09/20/2016  PHQ - 2 Score 0     Cognitive Function        Immunization History  Administered Date(s)  Administered  . Influenza Whole 10/10/2009  . Influenza-Unspecified 11/11/2010    Qualifies for Shingles Vaccine?***  Screening Tests Health Maintenance  Topic Date Due  .  TETANUS/TDAP  01/11/2011  . DEXA SCAN  05/07/2013  . PNA vac Low Risk Adult (1 of 2 - PCV13) 05/07/2013  . MAMMOGRAM  05/04/2017  . INFLUENZA VACCINE  08/10/2017  . COLONOSCOPY  12/10/2025  . Hepatitis C Screening  Completed    Cancer Screenings: Lung: Low Dose CT Chest recommended if Age 44-80 years, 30 pack-year currently smoking OR have quit w/in 15years. Patient {DOES NOT does:27190::"does not"} qualify. Breast:  Up to date on Mammogram? {Yes/No:30480221}   Up to date of Bone Density/Dexa? {Yes/No:30480221} Colorectal: ***  Additional Screenings: ***: Hepatitis C Screening:      Plan:   ***   I have personally reviewed and noted the following in the patient's chart:   . Medical and social history . Use of alcohol, tobacco or illicit drugs  . Current medications and supplements . Functional ability and status . Nutritional status . Physical activity . Advanced directives . List of other physicians . Hospitalizations, surgeries, and ER visits in previous 12 months . Vitals . Screenings to include cognitive, depression, and falls . Referrals and appointments  In addition, I have reviewed and discussed with patient certain preventive protocols, quality metrics, and best practice recommendations. A written personalized care plan for preventive services as well as general preventive health recommendations were provided to patient.     Wynetta Fines, RN  05/12/2017

## 2017-05-16 ENCOUNTER — Ambulatory Visit: Payer: Medicare Other

## 2017-05-23 NOTE — Progress Notes (Addendum)
Subjective:   Brandy [name] is a 67 y.o. female who presents for an Initial Medicare Annual Wellness Visit.  Reports health as CABG- 2007 Just got back form sailboat cruise  There were 4 of them   Asked about MMR Had german measles when young Never had the chicken pox or mumps. Discussed option for titer draw and re-vaccinate or to discuss with md  Hypersomnia - sleeps a lot  Has been resting a lot Has not had the pneumonia vaccines  Is sick today and will postpone  Diet bMI 22.3 Lactose intolerant Health meals; fruits and vegetables    Exercise Haven't done as much as she needs too [employer] work Will get back in the gym Some joint pain Played soccer; run horses, white water rafting Extreme type sports      Health Maintenance Due  Topic Date Due  . TETANUS/TDAP  01/11/2011  . DEXA SCAN  05/07/2013  . PNA vac Low Risk Adult (1 of 2 - PCV13) 05/07/2013  . MAMMOGRAM  05/04/2017    Dexa  - have not had one in awhile Through Dr. Nori Riis - GYN  Had hyst in the past;  Postponing pneumonia vaccines due to being ill   Agrees to repeat Dexa due to family hx at GI imaging Dr. Volanda Napoleon will follow   Mammogram the year before last;  Doctor wanted to remove small area in breast; she has declined. Will have another mammogram next year 2020;  Not sure she would have the treatment; Has been through a lot in her life and feels good about her decision thus far        Objective:    Today's Vitals   05/24/17 1310  BP: 110/70  Pulse: 67  Temp: 98.3 F (36.8 C)  SpO2: 98%  Weight: 114 lb (51.7 kg)  Height: 5' (1.524 m)   Body mass index is 22.26 kg/m.  Advanced Directives 05/24/2017 03/01/2016 03/01/2016 02/23/2016  Does Patient Have a Medical Advance Directive? Yes Yes Yes Yes  Type of Advance Directive - [place];Living will [place];Living will Living will  Does patient want to make changes to medical advance directive? - No -  Patient declined - -  Copy of [place] in Chart? - Yes Yes -    Current Medications (verified) Outpatient Encounter Medications as of 05/24/2017  Medication Sig  . acetaminophen (TYLENOL) 650 MG CR tablet Take 650 mg by mouth every 8 (eight) hours as needed for pain.  Marland Kitchen amLODipine (NORVASC) 2.5 MG tablet Take 1 tablet (2.5 mg total) by mouth daily.  . Calcium Carb-Cholecalciferol (CALCIUM 500+D) 500-400 MG-UNIT TABS Take 2 tablets by mouth daily.  . famotidine (PEPCID) 20 MG tablet Take 20 mg by mouth daily.  Marland Kitchen FLUoxetine (PROZAC) 20 MG tablet Take 20 mg by mouth daily.  . irbesartan (AVAPRO) 300 MG tablet Take 1 tablet (300 mg total) by mouth daily.  . modafinil (PROVIGIL) 200 MG tablet Take 1 tablet (200 mg total) by mouth daily.  . mometasone (ELOCON) 0.1 % cream as needed.  . Multiple Vitamins-Minerals (MULTIPLE VITAMINS/WOMENS) tablet Take 1 tablet by mouth daily.  . naproxen (NAPROSYN) 250 MG tablet Take 250 mg by mouth 1 day or 1 dose.  . scopolamine (TRANSDERM-SCOP, 1.5 MG,) 1 MG/3DAYS Place 1 patch (1.5 mg total) onto the skin every 3 (three) days.   No facility-administered encounter medications on file as of 05/24/2017.     Allergies (verified) Penicillins; Bextra [  valdecoxib]; Codeine; Lactose intolerance (gi); Meperidine hcl; Morphine; and Pentazocine lactate   History: Past Medical History:  Diagnosis Date  . Arthritis   . Bipolar 1 disorder ([place])   . CAD (coronary artery disease) 2007   CABG X 1  . Depression    controlled  . Hay fever   . Headache    Hx of migraines  . Heart murmur   . Hyperlipidemia   . Hypertension   . Idiopathic hypersomnia   . Mitral valve prolapse   . PONV (postoperative nausea and vomiting)   . Urinary incontinence    Past Surgical History:  Procedure Laterality Date  . cataract surgery     BIL  . CORONARY ARTERY BYPASS GRAFT  03/21/05   off pump LIMA-LAD  . myomectomy  1981  . reconstructive surgery  1945    lip   . [place]   3 times  . SHOULDER ARTHROSCOPY  2006  . TOTAL ABDOMINAL HYSTERECTOMY  1991  . TOTAL HIP ARTHROPLASTY Left 03/01/2016   Procedure: LEFT TOTAL HIP ARTHROPLASTY ANTERIOR APPROACH;  Surgeon: Paralee Cancel, MD;  Location: [facility];  Service: Orthopedics;  Laterality: Left;  Requests 70 mins  . trach    . VESICOVAGINAL FISTULA CLOSURE W/ TAH     Family History  Problem Relation Age of Onset  . Stroke Mother        died 51  . Aneurysm Mother   . Hypertension Mother   . Kidney disease Mother   . Bipolar disorder Mother   . Lung cancer Father        died 41  . Bipolar disorder Unknown   . Hypertension Unknown   . Osteoporosis Maternal Grandmother   . Arthritis Maternal Grandmother   . Alcohol abuse Maternal Grandfather    Social History   Socioeconomic History  . Marital status: Divorced    Spouse name: Not on file  . Number of children: Not on file  . Years of education: Not on file  . Highest education level: Not on file  Occupational History  . Occupation: retired  Scientific laboratory technician  . Financial resource strain: Not on file  . Food insecurity:    Worry: Not on file    Inability: Not on file  . Transportation needs:    Medical: Not on file    Non-medical: Not on file  Tobacco Use  . Smoking status: Never Smoker  . Smokeless tobacco: Never Used  Substance and Sexual Activity  . Alcohol use: No  . Drug use: No  . Sexual activity: Yes  Lifestyle  . Physical activity:    Days per week: Not on file    Minutes per session: Not on file  . Stress: Not on file  Relationships  . Social connections:    Talks on phone: Not on file    Gets together: Not on file    Attends religious service: Not on file    Active member of club or organization: Not on file    Attends meetings of clubs or organizations: Not on file    Relationship status: Not on file  Other Topics Concern  . Not on file  Social History Narrative  . Not on file    Tobacco  Counseling Counseling given: Yes   Clinical Intake:   Activities of Daily Living In your present state of health, do you have any difficulty performing the following activities: 05/24/2017 09/20/2016  Hearing? N N  Vision? N N  Difficulty concentrating or making decisions? N N  Walking or climbing stairs? N N  Dressing or bathing? N N  Doing errands, shopping? N N  Preparing Food and eating ? N -  Using the Toilet? N -  In the past six months, have you accidently leaked urine? N -  Do you have problems with loss of bowel control? N -  Managing your Medications? N -  Managing your Finances? N -  Housekeeping or managing your Housekeeping? N -  Some recent data might be hidden     Immunizations and Health Maintenance Immunization History  Administered Date(s) Administered  . Influenza Whole 10/10/2009  . Influenza-Unspecified 11/11/2010   Health Maintenance Due  Topic Date Due  . TETANUS/TDAP  01/11/2011  . DEXA SCAN  05/07/2013  . PNA vac Low Risk Adult (1 of 2 - PCV13) 05/07/2013  . MAMMOGRAM  05/04/2017    Patient Care Team: Billie Ruddy, MD as PCP - General (Family Medicine)  Indicate any recent Medical Services you may have received from other than [facility] providers in the past year (date may be approximate).     Assessment:   This is a routine wellness examination for [place].  Hearing/Vision screen Hearing Screening Comments: Hearing issues none noted Vision Screening Comments: Vision;  Had cataract surgery Dr. Herbert Deaner   Dietary issues and exercise activities discussed: Current Exercise Habits: Home exercise routine, Type of exercise: strength training/weights  Goals    . Exercise 150 min/wk Moderate Activity     Some kind of regular exercise Zumba or other - exercise of choice  Attempt 3 days or more as tolerated       Depression Screen PHQ 2/9 Scores 05/24/2017 09/20/2016  PHQ - 2 Score 0 0    Fall Risk Fall Risk  05/24/2017 09/20/2016  Falls in  the past year? No No     Cognitive Function: Ad8 score reviewed for issues:  Issues making decisions:  Less interest in hobbies / activities:  Repeats questions, stories (family complaining):  Trouble using ordinary gadgets (microwave, computer, phone):  Forgets the month or year:   Mismanaging finances:   Remembering appts:  Daily problems with thinking and/or memory: Ad8 score is=0   Screening Tests Health Maintenance  Topic Date Due  . TETANUS/TDAP  01/11/2011  . DEXA SCAN  05/07/2013  . PNA vac Low Risk Adult (1 of 2 - PCV13) 05/07/2013  . MAMMOGRAM  05/04/2017  . INFLUENZA VACCINE  08/10/2017  . COLONOSCOPY  12/10/2025  . Hepatitis C Screening  Completed        Plan:      PCP Notes   Health Maintenance Dexa  - agreed to repeat at GI  Will fup with Dr. Volanda Napoleon  Mammogram followed by Dr. Nori Riis; Declines to repeat this year Had area of concern but has declined intervention as she does not feel like this was cancer. Will recheck next year.  Discussed pros and cons of waiting. Has a science background and did her own research  Postponing pneumonia vaccines due to being ill today  Asked about MMR; had german measles Did not have chicken pox or mumps edu on CDC for those born prior to 87'  Can discuss with Dr. Volanda Napoleon  Shingrix on hold   Pneumonia series postponed due to illness today   Will schedule an eye exam  Abnormal Screens  None  Referrals  none  Patient concerns; none  Nurse Concerns; As noted  Next PCP apt -ov today  with Dorothyann Peng NP     I have personally reviewed and noted the following in the patient's chart:   . Medical and social history . Use of alcohol, tobacco or illicit drugs  . Current medications and supplements . Functional ability and status . Nutritional status . Physical activity . Advanced directives . List of other physicians . Hospitalizations, surgeries, and ER visits in previous 12  months . Vitals . Screenings to include cognitive, depression, and falls . Referrals and appointments  In addition, I have reviewed and discussed with patient certain preventive protocols, quality metrics, and best practice recommendations. A written personalized care plan for preventive services as well as general preventive health recommendations were provided to patient.     TFTDD,UKGUR, RN   05/24/2017   I have reviewed the documentation for the [facility] and [place] provided by the health coach and agree with their documentation. I was immediately available for any questions  Eulas Post MD [facility]

## 2017-05-24 ENCOUNTER — Encounter: Payer: Self-pay | Admitting: Adult Health

## 2017-05-24 ENCOUNTER — Ambulatory Visit (INDEPENDENT_AMBULATORY_CARE_PROVIDER_SITE_OTHER): Payer: Medicare Other | Admitting: Adult Health

## 2017-05-24 ENCOUNTER — Ambulatory Visit (INDEPENDENT_AMBULATORY_CARE_PROVIDER_SITE_OTHER): Payer: Medicare Other

## 2017-05-24 VITALS — BP 110/70 | HR 67 | Temp 98.3°F | Wt 114.0 lb

## 2017-05-24 VITALS — BP 110/70 | HR 67 | Temp 98.3°F | Ht 60.0 in | Wt 114.0 lb

## 2017-05-24 DIAGNOSIS — I2583 Coronary atherosclerosis due to lipid rich plaque: Secondary | ICD-10-CM | POA: Diagnosis not present

## 2017-05-24 DIAGNOSIS — H6503 Acute serous otitis media, bilateral: Secondary | ICD-10-CM

## 2017-05-24 DIAGNOSIS — J40 Bronchitis, not specified as acute or chronic: Secondary | ICD-10-CM

## 2017-05-24 DIAGNOSIS — Z0000 Encounter for general adult medical examination without abnormal findings: Secondary | ICD-10-CM

## 2017-05-24 DIAGNOSIS — E2839 Other primary ovarian failure: Secondary | ICD-10-CM

## 2017-05-24 DIAGNOSIS — I2510 Atherosclerotic heart disease of native coronary artery without angina pectoris: Secondary | ICD-10-CM | POA: Diagnosis not present

## 2017-05-24 MED ORDER — IPRATROPIUM-ALBUTEROL 0.5-2.5 (3) MG/3ML IN SOLN: 3.0000 mL | Freq: Once | RESPIRATORY_TRACT | Status: DC

## 2017-05-24 MED ORDER — PREDNISONE 10 MG PO TABS: ORAL_TABLET | ORAL | 0 refills | Status: DC

## 2017-05-24 MED ORDER — BENZONATATE 200 MG PO CAPS: 200.0000 mg | ORAL_CAPSULE | Freq: Two times a day (BID) | ORAL | 0 refills | Status: DC | PRN

## 2017-05-24 NOTE — Progress Notes (Signed)
Subjective:    Patient ID: Brandy [name], female    DOB: 03/08/1950, 67 y.o.   MRN: [id number]  HPI  66 year old female who  has a past medical history of Arthritis, Bipolar 1 disorder ([place]), CAD (coronary artery disease) (2007), Depression, Hay fever, Headache, Heart murmur, Hyperlipidemia, Hypertension, Idiopathic hypersomnia, Mitral valve prolapse, PONV (postoperative nausea and vomiting), and Urinary incontinence.  She presents to the office today for the complaint of cough. Reports that she was recently sailing in the [place], when she returned she started to have a dry barky cough. Her symptoms have been present for about 10 days. Other symptoms include that of SOB and wheezing with chest congestion.   Also reports as though her ears feel clogged.   Denies any fever or feeling ill.    Review of Systems   See HPI    Past Medical History:  Diagnosis Date  . Arthritis   . Bipolar 1 disorder ([place])   . CAD (coronary artery disease) 2007   CABG X 1  . Depression    controlled  . Hay fever   . Headache    Hx of migraines  . Heart murmur   . Hyperlipidemia   . Hypertension   . Idiopathic hypersomnia   . Mitral valve prolapse   . PONV (postoperative nausea and vomiting)   . Urinary incontinence     Social History   Socioeconomic History  . Marital status: Divorced    Spouse name: Not on file  . Number of children: Not on file  . Years of education: Not on file  . Highest education level: Not on file  Occupational History  . Occupation: retired  Scientific laboratory technician  . Financial resource strain: Not on file  . Food insecurity:    Worry: Not on file    Inability: Not on file  . Transportation needs:    Medical: Not on file    Non-medical: Not on file  Tobacco Use  . Smoking status: Never Smoker  . Smokeless tobacco: Never Used  Substance and Sexual Activity  . Alcohol use: No  . Drug use: No  . Sexual activity: Yes  Lifestyle  . Physical activity:   Days per week: Not on file    Minutes per session: Not on file  . Stress: Not on file  Relationships  . Social connections:    Talks on phone: Not on file    Gets together: Not on file    Attends religious service: Not on file    Active member of club or organization: Not on file    Attends meetings of clubs or organizations: Not on file    Relationship status: Not on file  . Intimate partner violence:    Fear of current or ex partner: Not on file    Emotionally abused: Not on file    Physically abused: Not on file    Forced sexual activity: Not on file  Other Topics Concern  . Not on file  Social History Narrative  . Not on file    Past Surgical History:  Procedure Laterality Date  . cataract surgery     BIL  . CORONARY ARTERY BYPASS GRAFT  03/21/05   off pump LIMA-LAD  . myomectomy  1981  . reconstructive surgery  1945   lip   . [place]   3 times  . SHOULDER ARTHROSCOPY  2006  . TOTAL ABDOMINAL HYSTERECTOMY  1991  . TOTAL  HIP ARTHROPLASTY Left 03/01/2016   Procedure: LEFT TOTAL HIP ARTHROPLASTY ANTERIOR APPROACH;  Surgeon: Paralee Cancel, MD;  Location: [facility];  Service: Orthopedics;  Laterality: Left;  Requests 70 mins  . trach    . VESICOVAGINAL FISTULA CLOSURE W/ TAH      Family History  Problem Relation Age of Onset  . Stroke Mother        died 36  . Aneurysm Mother   . Hypertension Mother   . Kidney disease Mother   . Bipolar disorder Mother   . Lung cancer Father        died 59  . Bipolar disorder Unknown   . Hypertension Unknown   . Osteoporosis Maternal Grandmother   . Arthritis Maternal Grandmother   . Alcohol abuse Maternal Grandfather     Allergies  Allergen Reactions  . Penicillins Anaphylaxis    Has patient had a PCN reaction causing immediate rash, facial/tongue/throat swelling, SOB or lightheadedness with hypotension: Yes Has patient had a PCN reaction causing severe rash involving mucus membranes or skin necrosis: No Has  patient had a PCN reaction that required hospitalization Yes Has patient had a PCN reaction occurring within the last 10 years: No If all of the above answers are "NO", then may proceed with Cephalosporin use.  Marland Kitchen Bextra [Valdecoxib] Hives  . Codeine Nausea And Vomiting  . Lactose Intolerance (Gi) Diarrhea  . Meperidine Hcl Nausea And Vomiting  . Morphine Nausea And Vomiting  . Pentazocine Lactate     Muscle spasms     Current Outpatient Medications on File Prior to Visit  Medication Sig Dispense Refill  . acetaminophen (TYLENOL) 650 MG CR tablet Take 650 mg by mouth every 8 (eight) hours as needed for pain.    Marland Kitchen amLODipine (NORVASC) 2.5 MG tablet Take 1 tablet (2.5 mg total) by mouth daily. 90 tablet 3  . Calcium Carb-Cholecalciferol (CALCIUM 500+D) 500-400 MG-UNIT TABS Take 2 tablets by mouth daily.    . famotidine (PEPCID) 20 MG tablet Take 20 mg by mouth daily.    Marland Kitchen FLUoxetine (PROZAC) 20 MG tablet Take 20 mg by mouth daily.    . irbesartan (AVAPRO) 300 MG tablet Take 1 tablet (300 mg total) by mouth daily. 90 tablet 3  . modafinil (PROVIGIL) 200 MG tablet Take 1 tablet (200 mg total) by mouth daily. 30 tablet 2  . mometasone (ELOCON) 0.1 % cream as needed.    . Multiple Vitamins-Minerals (MULTIPLE VITAMINS/WOMENS) tablet Take 1 tablet by mouth daily.    . naproxen (NAPROSYN) 250 MG tablet Take 250 mg by mouth 1 day or 1 dose.    . scopolamine (TRANSDERM-SCOP, 1.5 MG,) 1 MG/3DAYS Place 1 patch (1.5 mg total) onto the skin every 3 (three) days. 5 patch 0   No current facility-administered medications on file prior to visit.     BP 110/70   Pulse 67   Temp 98.3 F (36.8 C) (Oral)   Wt 114 lb (51.7 kg)   LMP 01/10/1989 (Approximate)   BMI 22.26 kg/m       Objective:   Physical Exam  Constitutional: She appears well-developed and well-nourished. No distress.  HENT:  Right Ear: Hearing, external ear and ear canal normal. Tympanic membrane is bulging. Tympanic membrane is not  erythematous.  Left Ear: Hearing, external ear and ear canal normal. Tympanic membrane is bulging. Tympanic membrane is not erythematous.  Mouth/Throat: Uvula is midline, oropharynx is clear and moist and mucous membranes are normal.  Eyes: Pupils are  equal, round, and reactive to light. Conjunctivae and EOM are normal. Right eye exhibits no discharge. Left eye exhibits no discharge. No scleral icterus.  Cardiovascular: Normal rate, regular rhythm, normal heart sounds and intact distal pulses. Exam reveals no friction rub.  No murmur heard. Pulmonary/Chest: Effort normal. No respiratory distress. She has wheezes (coarse wheezing throughout ). She has no rales. She exhibits no tenderness.  Skin: She is not diaphoretic.  Vitals reviewed.     Assessment & Plan:   1. Bronchitis - Exam consistent with bronchitis. No signs of pneumonia present. She reported feeling as though breathing had improved after duoneb. On exam, wheezing had resolved.  - ipratropium-albuterol (DUONEB) 0.5-2.5 (3) MG/3ML nebulizer solution 3 mL - Will do prednisone course and tessalon pearls   2. Non-recurrent acute serous otitis media of both ears - No signs of infection  - Advised flonase  - Follow up if no improvement in the next 2-3 days    Dorothyann Peng, NP

## 2017-05-24 NOTE — Patient Instructions (Addendum)
Brandy [name] , Thank you for taking time to come for your Medicare Wellness Visit. I appreciate your ongoing commitment to your health goals. Please review the following plan we discussed and let me know if I can assist you in the future.  The Centers for Disease Control are now recommending 2 pneumonia vaccinations after 10. The first is the Prevnar 13. This helps to boost your immunity to community acquired pneumonia as well as some protection from bacterial pneumonia  Will consider discussion of chicken pox titer vs vaccines with Dr. Volanda Napoleon  Did not have chickenpox so has not had the shingles Shingrix is a vaccine for the prevention of Shingles in Adults 1 and older.  If you are on Medicare, the shingrix is covered under your Part D plan, so you will take both of the vaccines in the series at your pharmacy. Please check with your benefits regarding applicable copays or out of pocket expenses.  The Shingrix is given in 2 vaccines approx 8 weeks apart. You must receive the 2nd dose prior to 6 months from receipt of the first. Please have the pharmacist print out you Immunization  dates for our office records    The 2nd is the pneumovax 23, which offers more broad protection!  Please consider taking these as this is your best protection against pneumonia. Will take your prevnar 13 when feeling better; you can make a nurse visit   A Tetanus is recommended every 10 years. Medicare covers a tetanus if you have a cut or wound; otherwise, there may be a charge. If you had not had a tetanus with pertusses, known as the Tdap, you can take this anytime.   Manuela Schwartz will order your DEXA scan at the [facility] and they will call you with an apt. Will hold mammogram for now   May consider having an eye exam to rule out early Macular issues; or other       These are the goals we discussed: Goals    . Exercise 150 min/wk Moderate Activity     Some kind of regular exercise Zumba or other -  exercise of choice  Attempt 3 days or more as tolerated        This is a list of the screening recommended for you and due dates:  Health Maintenance  Topic Date Due  . Tetanus Vaccine  01/11/2011  . DEXA scan (bone density measurement)  05/07/2013  . Pneumonia vaccines (1 of 2 - PCV13) 05/07/2013  . Mammogram  05/04/2017  . Flu Shot  08/10/2017  . Colon Cancer Screening  12/10/2025  .  Hepatitis C: One time screening is recommended by Center for Disease Control  (CDC) for  adults born from [birth year] through 1965.   Completed   Prevention of falls: Remove rugs or any tripping hazards in the home Use Non slip mats in bathtubs and showers Placing grab bars next to the toilet and or shower Placing handrails on both sides of the stair way Adding extra lighting in the home.   Personal safety issues reviewed:  1. Consider starting a community watch program per [facility] 2.  Changes batteries is smoke detector and/or carbon monoxide detector  3.  If you have firearms; keep them in a safe place 4.  Wear protection when in the sun; Always wear sunscreen or a hat; It is good to have your doctor check your skin annually or review any new areas of concern 5. Driving safety; Keep in  the right lane; stay 3 car lengths behind the car in front of you on the highway; look 3 times prior to pulling out; carry your cell phone everywhere you go!     Bone Densitometry Bone densitometry is an imaging test that uses a special X-ray to measure the amount of calcium and other minerals in your bones (bone density). This test is also known as a bone mineral density test or dual-energy X-ray absorptiometry (DXA). The test can measure bone density at your hip and your spine. It is similar to having a regular X-ray. You may have this test to:  Diagnose a condition that causes weak or thin bones (osteoporosis).  Predict your risk of a broken bone (fracture).  Determine how well osteoporosis treatment  is working.  Tell a health care provider about:  Any allergies you have.  All medicines you are taking, including vitamins, herbs, eye drops, creams, and over-the-counter medicines.  Any problems you or family members have had with anesthetic medicines.  Any blood disorders you have.  Any surgeries you have had.  Any medical conditions you have.  Possibility of pregnancy.  Any other medical test you had within the previous 14 days that used contrast material. What are the risks? Generally, this is a safe procedure. However, problems can occur and may include the following:  This test exposes you to a very small amount of radiation.  The risks of radiation exposure may be greater to unborn children.  What happens before the procedure?  Do not take any calcium supplements for 24 hours before having the test. You can otherwise eat and drink what you usually do.  Take off all metal jewelry, eyeglasses, dental appliances, and any other metal objects. What happens during the procedure?  You may lie on an exam table. There will be an X-ray generator below you and an imaging device above you.  Other devices, such as boxes or braces, may be used to position your body properly for the scan.  You will need to lie still while the machine slowly scans your body.  The images will show up on a computer monitor. What happens after the procedure? You may need more testing at a later time. This information is not intended to replace advice given to you by your health care provider. Make sure you discuss any questions you have with your health care provider. Document Released: 01/19/2004 Document Revised: 06/04/2015 Document Reviewed: 06/06/2013 Elsevier Interactive Patient Education  2018 [place] in the Home Falls can cause injuries. They can happen to people of all ages. There are many things you can do to make your home safe and to help prevent falls. What  can I do on the outside of my home?  Regularly fix the edges of walkways and driveways and fix any cracks.  Remove anything that might make you trip as you walk through a door, such as a raised step or threshold.  Trim any bushes or trees on the path to your home.  Use bright outdoor lighting.  Clear any walking paths of anything that might make someone trip, such as rocks or tools.  Regularly check to see if handrails are loose or broken. Make sure that both sides of any steps have handrails.  Any raised decks and porches should have guardrails on the edges.  Have any leaves, snow, or ice cleared regularly.  Use sand or salt on walking paths during winter.  Clean up any spills in  your garage right away. This includes oil or grease spills. What can I do in the bathroom?  Use night lights.  Install grab bars by the toilet and in the tub and shower. Do not use towel bars as grab bars.  Use non-skid mats or decals in the tub or shower.  If you need to sit down in the shower, use a plastic, non-slip stool.  Keep the floor dry. Clean up any water that spills on the floor as soon as it happens.  Remove soap buildup in the tub or shower regularly.  Attach bath mats securely with double-sided non-slip rug tape.  Do not have throw rugs and other things on the floor that can make you trip. What can I do in the bedroom?  Use night lights.  Make sure that you have a light by your bed that is easy to reach.  Do not use any sheets or blankets that are too big for your bed. They should not hang down onto the floor.  Have a firm chair that has side arms. You can use this for support while you get dressed.  Do not have throw rugs and other things on the floor that can make you trip. What can I do in the kitchen?  Clean up any spills right away.  Avoid walking on wet floors.  Keep items that you use a lot in easy-to-reach places.  If you need to reach something above you, use a  strong step stool that has a grab bar.  Keep electrical cords out of the way.  Do not use floor polish or wax that makes floors slippery. If you must use wax, use non-skid floor wax.  Do not have throw rugs and other things on the floor that can make you trip. What can I do with my stairs?  Do not leave any items on the stairs.  Make sure that there are handrails on both sides of the stairs and use them. Fix handrails that are broken or loose. Make sure that handrails are as long as the stairways.  Check any carpeting to make sure that it is firmly attached to the stairs. Fix any carpet that is loose or worn.  Avoid having throw rugs at the top or bottom of the stairs. If you do have throw rugs, attach them to the floor with carpet tape.  Make sure that you have a light switch at the top of the stairs and the bottom of the stairs. If you do not have them, ask someone to add them for you. What else can I do to help prevent falls?  Wear shoes that: ? Do not have high heels. ? Have rubber bottoms. ? Are comfortable and fit you well. ? Are closed at the toe. Do not wear sandals.  If you use a stepladder: ? Make sure that it is fully opened. Do not climb a closed stepladder. ? Make sure that both sides of the stepladder are locked into place. ? Ask someone to hold it for you, if possible.  Clearly mark and make sure that you can see: ? Any grab bars or handrails. ? First and last steps. ? Where the edge of each step is.  Use tools that help you move around (mobility aids) if they are needed. These include: ? Canes. ? Walkers. ? Scooters. ? Crutches.  Turn on the lights when you go into a dark area. Replace any light bulbs as soon as they burn out.  Set  up your furniture so you have a clear path. Avoid moving your furniture around.  If any of your floors are uneven, fix them.  If there are any pets around you, be aware of where they are.  Review your medicines with your  doctor. Some medicines can make you feel dizzy. This can increase your chance of falling. Ask your doctor what other things that you can do to help prevent falls. This information is not intended to replace advice given to you by your health care provider. Make sure you discuss any questions you have with your health care provider. Document Released: 10/23/2008 Document Revised: 06/04/2015 Document Reviewed: 01/31/2014 Elsevier Interactive Patient Education  2018 [place] Maintenance, Female Adopting a healthy lifestyle and getting preventive care can go a long way to promote health and wellness. Talk with your health care provider about what schedule of regular examinations is right for you. This is a good chance for you to check in with your provider about disease prevention and staying healthy. In between checkups, there are plenty of things you can do on your own. Experts have done a lot of research about which lifestyle changes and preventive measures are most likely to keep you healthy. Ask your health care provider for more information. Weight and diet Eat a healthy diet  Be sure to include plenty of vegetables, fruits, low-fat dairy products, and lean protein.  Do not eat a lot of foods high in solid fats, added sugars, or salt.  Get regular exercise. This is one of the most important things you can do for your health. ? Most adults should exercise for at least 150 minutes each week. The exercise should increase your heart rate and make you sweat (moderate-intensity exercise). ? Most adults should also do strengthening exercises at least twice a week. This is in addition to the moderate-intensity exercise.  Maintain a healthy weight  Body mass index (BMI) is a measurement that can be used to identify possible weight problems. It estimates body fat based on height and weight. Your health care provider can help determine your BMI and help you achieve or maintain a healthy  weight.  For females 40 years of age and older: ? A BMI below 18.5 is considered underweight. ? A BMI of 18.5 to 24.9 is normal. ? A BMI of 25 to 29.9 is considered overweight. ? A BMI of 30 and above is considered obese.  Watch levels of cholesterol and blood lipids  You should start having your blood tested for lipids and cholesterol at 69 years of age, then have this test every 5 years.  You may need to have your cholesterol levels checked more often if: ? Your lipid or cholesterol levels are high. ? You are older than 69 years of age. ? You are at high risk for heart disease.  Cancer screening Lung Cancer  Lung cancer screening is recommended for adults 75-31 years old who are at high risk for lung cancer because of a history of smoking.  A yearly low-dose CT scan of the lungs is recommended for people who: ? Currently smoke. ? Have quit within the past 15 years. ? Have at least a 30-pack-year history of smoking. A pack year is smoking an average of one pack of cigarettes a day for 1 year.  Yearly screening should continue until it has been 15 years since you quit.  Yearly screening should stop if you develop a health problem that would prevent you from  having lung cancer treatment.  Breast Cancer  Practice breast self-awareness. This means understanding how your breasts normally appear and feel.  It also means doing regular breast self-exams. Let your health care provider know about any changes, no matter how small.  If you are in your 20s or 30s, you should have a clinical breast exam (CBE) by a health care provider every 1-3 years as part of a regular health exam.  If you are 54 or older, have a CBE every year. Also consider having a breast X-ray (mammogram) every year.  If you have a family history of breast cancer, talk to your health care provider about genetic screening.  If you are at high risk for breast cancer, talk to your health care provider about having an  MRI and a mammogram every year.  Breast cancer gene (BRCA) assessment is recommended for women who have family members with BRCA-related cancers. BRCA-related cancers include: ? Breast. ? Ovarian. ? Tubal. ? Peritoneal cancers.  Results of the assessment will determine the need for genetic counseling and BRCA1 and BRCA2 testing.  Cervical Cancer Your health care provider may recommend that you be screened regularly for cancer of the pelvic organs (ovaries, uterus, and vagina). This screening involves a pelvic examination, including checking for microscopic changes to the surface of your cervix (Pap test). You may be encouraged to have this screening done every 3 years, beginning at age 109.  For women ages 42-65, health care providers may recommend pelvic exams and Pap testing every 3 years, or they may recommend the Pap and pelvic exam, combined with testing for human papilloma virus (HPV), every 5 years. Some types of HPV increase your risk of cervical cancer. Testing for HPV may also be done on women of any age with unclear Pap test results.  Other health care providers may not recommend any screening for nonpregnant women who are considered low risk for pelvic cancer and who do not have symptoms. Ask your health care provider if a screening pelvic exam is right for you.  If you have had past treatment for cervical cancer or a condition that could lead to cancer, you need Pap tests and screening for cancer for at least 20 years after your treatment. If Pap tests have been discontinued, your risk factors (such as having a new sexual partner) need to be reassessed to determine if screening should resume. Some women have medical problems that increase the chance of getting cervical cancer. In these cases, your health care provider may recommend more frequent screening and Pap tests.  Colorectal Cancer  This type of cancer can be detected and often prevented.  Routine colorectal cancer screening  usually begins at 69 years of age and continues through 69 years of age.  Your health care provider may recommend screening at an earlier age if you have risk factors for colon cancer.  Your health care provider may also recommend using home test kits to check for hidden blood in the stool.  A small camera at the end of a tube can be used to examine your colon directly (sigmoidoscopy or colonoscopy). This is done to check for the earliest forms of colorectal cancer.  Routine screening usually begins at age 58.  Direct examination of the colon should be repeated every 5-10 years through 69 years of age. However, you may need to be screened more often if early forms of precancerous polyps or small growths are found.  Skin Cancer  Check your skin from head  to toe regularly.  Tell your health care provider about any new moles or changes in moles, especially if there is a change in a mole's shape or color.  Also tell your health care provider if you have a mole that is larger than the size of a pencil eraser.  Always use sunscreen. Apply sunscreen liberally and repeatedly throughout the day.  Protect yourself by wearing long sleeves, pants, a wide-brimmed hat, and sunglasses whenever you are outside.  Heart disease, diabetes, and high blood pressure  High blood pressure causes heart disease and increases the risk of stroke. High blood pressure is more likely to develop in: ? People who have blood pressure in the high end of the normal range (130-139/85-89 mm Hg). ? People who are overweight or obese. ? People who are African American.  If you are 61-84 years of age, have your blood pressure checked every 3-5 years. If you are 87 years of age or older, have your blood pressure checked every year. You should have your blood pressure measured twice-once when you are at a hospital or clinic, and once when you are not at a hospital or clinic. Record the average of the two measurements. To check  your blood pressure when you are not at a hospital or clinic, you can use: ? An automated blood pressure machine at a pharmacy. ? A home blood pressure monitor.  If you are between 14 years and 49 years old, ask your health care provider if you should take aspirin to prevent strokes.  Have regular diabetes screenings. This involves taking a blood sample to check your fasting blood sugar level. ? If you are at a normal weight and have a low risk for diabetes, have this test once every three years after 69 years of age. ? If you are overweight and have a high risk for diabetes, consider being tested at a younger age or more often. Preventing infection Hepatitis B  If you have a higher risk for hepatitis B, you should be screened for this virus. You are considered at high risk for hepatitis B if: ? You were born in a country where hepatitis B is common. Ask your health care provider which countries are considered high risk. ? Your parents were born in a high-risk country, and you have not been immunized against hepatitis B (hepatitis B vaccine). ? You have HIV or AIDS. ? You use needles to inject street drugs. ? You live with someone who has hepatitis B. ? You have had sex with someone who has hepatitis B. ? You get hemodialysis treatment. ? You take certain medicines for conditions, including cancer, organ transplantation, and autoimmune conditions.  Hepatitis C  Blood testing is recommended for: ? Everyone born from [birth year] through 1967. ? Anyone with known risk factors for hepatitis C.  Sexually transmitted infections (STIs)  You should be screened for sexually transmitted infections (STIs) including gonorrhea and chlamydia if: ? You are sexually active and are younger than 69 years of age. ? You are older than 69 years of age and your health care provider tells you that you are at risk for this type of infection. ? Your sexual activity has changed since you were last screened and you  are at an increased risk for chlamydia or gonorrhea. Ask your health care provider if you are at risk.  If you do not have HIV, but are at risk, it may be recommended that you take a prescription medicine daily to prevent HIV  infection. This is called pre-exposure prophylaxis (PrEP). You are considered at risk if: ? You are sexually active and do not regularly use condoms or know the HIV status of your partner(s). ? You take drugs by injection. ? You are sexually active with a partner who has HIV.  Talk with your health care provider about whether you are at high risk of being infected with HIV. If you choose to begin PrEP, you should first be tested for HIV. You should then be tested every 3 months for as long as you are taking PrEP. Pregnancy  If you are premenopausal and you may become pregnant, ask your health care provider about preconception counseling.  If you may become pregnant, take 400 to 800 micrograms (mcg) of folic acid every day.  If you want to prevent pregnancy, talk to your health care provider about birth control (contraception). Osteoporosis and menopause  Osteoporosis is a disease in which the bones lose minerals and strength with aging. This can result in serious bone fractures. Your risk for osteoporosis can be identified using a bone density scan.  If you are 70 years of age or older, or if you are at risk for osteoporosis and fractures, ask your health care provider if you should be screened.  Ask your health care provider whether you should take a calcium or vitamin D supplement to lower your risk for osteoporosis.  Menopause may have certain physical symptoms and risks.  Hormone replacement therapy may reduce some of these symptoms and risks. Talk to your health care provider about whether hormone replacement therapy is right for you. Follow these instructions at home:  Schedule regular health, dental, and eye exams.  Stay current with your  immunizations.  Do not use any tobacco products including cigarettes, chewing tobacco, or electronic cigarettes.  If you are pregnant, do not drink alcohol.  If you are breastfeeding, limit how much and how often you drink alcohol.  Limit alcohol intake to no more than 1 drink per day for nonpregnant women. One drink equals 12 ounces of beer, 5 ounces of wine, or 1 ounces of hard liquor.  Do not use street drugs.  Do not share needles.  Ask your health care provider for help if you need support or information about quitting drugs.  Tell your health care provider if you often feel depressed.  Tell your health care provider if you have ever been abused or do not feel safe at home. This information is not intended to replace advice given to you by your health care provider. Make sure you discuss any questions you have with your health care provider. Document Released: 07/12/2010 Document Revised: 06/04/2015 Document Reviewed: 09/30/2014 Elsevier Interactive Patient Education  2018 [place] A mammogram is an X-ray of the breasts that is done to check for abnormal changes. This procedure can screen for and detect any changes that may suggest breast cancer. A mammogram can also identify other changes and variations in the breast, such as:  Inflammation of the breast tissue (mastitis).  An infected area that contains a collection of pus (abscess).  A fluid-filled sac (cyst).  Fibrocystic changes. This is when breast tissue becomes denser, which can make the tissue feel rope-like or uneven under the skin.  Tumors that are not cancerous (benign).  Tell a health care provider about:  Any allergies you have.  If you have breast implants.  If you have had previous breast disease, biopsy, or surgery.  If you are  breastfeeding.  Any possibility that you could be pregnant, if this applies.  If you are younger than age 40.  If you have a family history of breast  cancer. What are the risks? Generally, this is a safe procedure. However, problems may occur, including:  Exposure to radiation. Radiation levels are very low with this test.  The results being misinterpreted.  The need for further tests.  The inability of the mammogram to detect certain cancers.  What happens before the procedure?  Schedule your test about 1-2 weeks after your menstrual period. This is usually when your breasts are the least tender.  If you have had a mammogram done at a different facility in the past, get the mammogram X-rays or have them sent to your current exam facility in order to compare them.  Wash your breasts and under your arms the day of the test.  Do not wear deodorants, perfumes, lotions, or powders anywhere on your body on the day of the test.  Remove any jewelry from your neck.  Wear clothes that you can change into and out of easily. What happens during the procedure?  You will undress from the waist up and put on a gown.  You will stand in front of the X-ray machine.  Each breast will be placed between two plastic or glass plates. The plates will compress your breast for a few seconds. Try to stay as relaxed as possible during the procedure. This does not cause any harm to your breasts and any discomfort you feel will be very brief.  X-rays will be taken from different angles of each breast. The procedure may vary among health care providers and hospitals. What happens after the procedure?  The mammogram will be examined by a specialist (radiologist).  You may need to repeat certain parts of the test, depending on the quality of the images. This is commonly done if the radiologist needs a better view of the breast tissue.  Ask when your test results will be ready. Make sure you get your test results.  You may resume your normal activities. This information is not intended to replace advice given to you by your health care provider. Make  sure you discuss any questions you have with your health care provider. Document Released: 12/25/1999 Document Revised: 06/01/2015 Document Reviewed: 03/07/2014 Elsevier Interactive Patient Education  [employer].

## 2017-06-07 ENCOUNTER — Encounter: Payer: Self-pay | Admitting: Pulmonary Disease

## 2017-06-07 ENCOUNTER — Ambulatory Visit (INDEPENDENT_AMBULATORY_CARE_PROVIDER_SITE_OTHER): Payer: Medicare Other | Admitting: Pulmonary Disease

## 2017-06-07 ENCOUNTER — Ambulatory Visit (INDEPENDENT_AMBULATORY_CARE_PROVIDER_SITE_OTHER)
Admission: RE | Admit: 2017-06-07 | Discharge: 2017-06-07 | Disposition: A | Discharge status: Home / Self Care | Payer: Medicare Other | Source: Ambulatory Visit | Attending: Pulmonary Disease | Admitting: Pulmonary Disease

## 2017-06-07 VITALS — BP 122/76 | HR 65 | Ht 60.0 in | Wt 115.2 lb

## 2017-06-07 DIAGNOSIS — R490 Dysphonia: Secondary | ICD-10-CM

## 2017-06-07 DIAGNOSIS — G47419 Narcolepsy without cataplexy: Secondary | ICD-10-CM

## 2017-06-07 DIAGNOSIS — I2510 Atherosclerotic heart disease of native coronary artery without angina pectoris: Secondary | ICD-10-CM

## 2017-06-07 DIAGNOSIS — I2583 Coronary atherosclerosis due to lipid rich plaque: Secondary | ICD-10-CM

## 2017-06-07 DIAGNOSIS — J984 Other disorders of lung: Secondary | ICD-10-CM | POA: Diagnosis not present

## 2017-06-07 MED ORDER — FLUTICASONE PROPIONATE 50 MCG/ACT NA SUSP: 2.0000 | Freq: Every day | NASAL | 2 refills | Status: DC

## 2017-06-07 MED ORDER — MODAFINIL 200 MG PO TABS: 200.0000 mg | ORAL_TABLET | Freq: Every day | ORAL | 2 refills | Status: DC

## 2017-06-07 NOTE — Patient Instructions (Signed)
Chest xray today Will arrange for referral to ENT to assess hoarseness Follow up in 1 year

## 2017-06-07 NOTE — Progress Notes (Signed)
[facility]  Chief Complaint  Patient presents with  . Follow-up    Pt does not think the modafinil is not working, pt is still increase sleepiness during the daytime. Pt takes 1-2 naps for 2-3 hrs each.    Vital signs: BP 122/76 (BP Location: Left Arm, Cuff Size: Normal)   Pulse 65   Ht 5' (1.524 m)   Wt 115 lb 3.2 oz (52.3 kg)   LMP 01/10/1989 (Approximate)   SpO2 96%   BMI 22.50 kg/m   History of Present Illness: Brandy [name] is a 67 y.o. 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  She still gets episodes of feeling sleepy during the day.  Her schedule allows her to take naps.  Her psychiatrist feels that her mania was related to medications, and no longer feels she has bipolar.  She has been maintained on prozac for depression.  She had episode of asthmatic bronchitis a few weeks ago.  Was having cough, wheeze, and clear sputum.  Improved after tx with prednisone and albuterol.  She has persistent sinus drainage and hoarseness.  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  She is worried she could have a singers nodule on her vocal cord.   Physical Exam:  General - pleasant Eyes - pupils reactive ENT - no sinus tenderness, no oral exudate, no LAN Cardiac - regular, no murmur Chest - no wheeze, rales Abd - soft, non tender Ext - no edema Skin - no rashes Neuro - normal strength Psych - normal mood  Assessment/Plan:  Narcolepsy w/o cataplexy. - continue modafinil 200 mg in morning - advised she could try using extra dose prn in the afternoon - discussed scheduled napping and regular sleep/wake schedule  Depression. - previous adderrall use likely contributed to symptoms of mania - f/u with Dr. Clovis Pu  Chronic hoarseness. - will get chest xray - continue flonase - will arrange for referral to ENT   Patient Instructions  Chest xray today Will arrange for referral to ENT to assess  hoarseness Follow up in 1 year    [name], MD [place] 06/07/2017, 11:13 AM  Flow Sheet  Sleep tests: PSG 04/30/07 >> AHI 0.5, PLMI 5.2  MSLT 04/30/07 >> mean sleep latency 4 min, 5/5 naps, 0/5 SOREM  Past Medical History: She  has a past medical history of Arthritis, Bipolar 1 disorder ([place]), CAD (coronary artery disease) (2007), Depression, Hay fever, Headache, Heart murmur, Hyperlipidemia, Hypertension, Idiopathic hypersomnia, Mitral valve prolapse, PONV (postoperative nausea and vomiting), and Urinary incontinence.  Past Surgical History: She  has a past surgical history that includes trach; myomectomy (1981); Vesicovaginal fistula closure w/ TAH; Rotator cuff repair (1998); Coronary artery bypass graft (03/21/05); Shoulder arthroscopy (2006); Total abdominal hysterectomy (1991); cataract surgery; Total hip arthroplasty (Left, 03/01/2016); and reconstructive surgery (1945).  Family History: Her family history includes Alcohol abuse in her maternal grandfather; Aneurysm in her mother; Arthritis in her maternal grandmother; Bipolar disorder in her mother and unknown relative; Hypertension in her mother and unknown relative; Kidney disease in her mother; Lung cancer in her father; Osteoporosis in her maternal grandmother; Stroke in her mother.  Social History: She  reports that she has never smoked. She has never used smokeless tobacco. She reports that she does not drink alcohol or use drugs.  Medications: Allergies as of 06/07/2017      Reactions   Penicillins Anaphylaxis   Has patient had a PCN reaction  causing immediate rash, facial/tongue/throat swelling, SOB or lightheadedness with hypotension: Yes Has patient had a PCN reaction causing severe rash involving mucus membranes or skin necrosis: No Has patient had a PCN reaction that required hospitalization Yes Has patient had a PCN reaction occurring within the last 10 years: No If all of the above  answers are "NO", then may proceed with Cephalosporin use.   Bextra [valdecoxib] Hives   Codeine Nausea And Vomiting   Lactose Intolerance (gi) Diarrhea   Meperidine Hcl Nausea And Vomiting   Morphine Nausea And Vomiting   Pentazocine Lactate    Muscle spasms       Medication List        Accurate as of 06/07/17 11:13 AM. Always use your most recent med list.          acetaminophen 650 MG CR tablet Commonly known as:  TYLENOL Take 650 mg by mouth every 8 (eight) hours as needed for pain.   amLODipine 2.5 MG tablet Commonly known as:  NORVASC Take 1 tablet (2.5 mg total) by mouth daily.   CALCIUM 500+D 500-400 MG-UNIT Tabs Generic drug:  Calcium Carb-Cholecalciferol Take 2 tablets by mouth daily.   famotidine 20 MG tablet Commonly known as:  PEPCID Take 20 mg by mouth daily.   FLUoxetine 20 MG tablet Commonly known as:  PROZAC Take 20 mg by mouth daily.   irbesartan 300 MG tablet Commonly known as:  AVAPRO Take 1 tablet (300 mg total) by mouth daily.   modafinil 200 MG tablet Commonly known as:  PROVIGIL Take 1 tablet (200 mg total) by mouth daily.   mometasone 0.1 % cream Commonly known as:  ELOCON as needed.   MULTIPLE VITAMINS/WOMENS tablet Take 1 tablet by mouth daily.   naproxen 250 MG tablet Commonly known as:  NAPROSYN Take 250 mg by mouth 1 day or 1 dose.

## 2017-06-08 ENCOUNTER — Telehealth: Payer: Self-pay | Admitting: Pulmonary Disease

## 2017-06-08 NOTE — Telephone Encounter (Signed)
Dg Chest 2 View  Result Date: 06/07/2017 CLINICAL DATA:  Chronic coarseness EXAM: CHEST - 2 VIEW COMPARISON:  04/18/2005 FINDINGS: Postop CABG. Heart size upper normal. Negative for heart failure, infiltrate, or effusion. Apical scarring bilaterally. IMPRESSION: No active cardiopulmonary disease. Electronically Signed   By: Franchot Gallo M.D.   On: 06/07/2017 13:19     Please let her know that chest xray showed expected changes from prior bypass surgery, but otherwise normal.

## 2017-06-09 NOTE — Telephone Encounter (Signed)
LMTCB

## 2017-06-13 NOTE — Telephone Encounter (Signed)
Left voice mail on machine for patient to return phone call back regarding results. X2 

## 2017-06-14 NOTE — Telephone Encounter (Signed)
ATC pt- unable to leave vm due to mailbox being full.  

## 2017-06-15 NOTE — Telephone Encounter (Signed)
Left voice mail on machine for patient to return phone call back regarding results. X4 Tried multiple times to contact patient for results, mailed letter in mail today. Nothing further needed.

## 2017-06-20 ENCOUNTER — Telehealth: Payer: Self-pay | Admitting: Pulmonary Disease

## 2017-06-20 NOTE — Telephone Encounter (Signed)
Brandy Mires, MD       9:08 AM  Note  Expand All Collapse All    ImagingResults(Last48hours)  Dg Chest 2 View  Result Date: 06/07/2017 CLINICAL DATA:  Chronic coarseness EXAM: CHEST - 2 VIEW COMPARISON:  04/18/2005 FINDINGS: Postop CABG. Heart size upper normal. Negative for heart failure, infiltrate, or effusion. Apical scarring bilaterally. IMPRESSION: No active cardiopulmonary disease. Electronically Signed   By: Franchot Gallo M.D.   On: 06/07/2017 13:19      Please let her know that chest xray showed expected changes from prior bypass surgery, but otherwise normal.         Spoke with patient-aware of results and no further questions/concerns at this time.

## 2017-08-24 ENCOUNTER — Ambulatory Visit (INDEPENDENT_AMBULATORY_CARE_PROVIDER_SITE_OTHER): Payer: Medicare Other | Admitting: Family Medicine

## 2017-08-24 ENCOUNTER — Encounter: Payer: Self-pay | Admitting: Family Medicine

## 2017-08-24 VITALS — BP 126/78 | HR 70 | Temp 98.9°F | Wt 111.0 lb

## 2017-08-24 DIAGNOSIS — L719 Rosacea, unspecified: Secondary | ICD-10-CM

## 2017-08-24 DIAGNOSIS — I2583 Coronary atherosclerosis due to lipid rich plaque: Secondary | ICD-10-CM

## 2017-08-24 DIAGNOSIS — I2510 Atherosclerotic heart disease of native coronary artery without angina pectoris: Secondary | ICD-10-CM

## 2017-08-24 MED ORDER — TETRACYCLINE HCL 500 MG PO CAPS: 500.0000 mg | ORAL_CAPSULE | Freq: Two times a day (BID) | ORAL | 0 refills | Status: AC

## 2017-08-24 NOTE — Progress Notes (Signed)
Subjective:    Patient ID: Brandy [name], female    DOB: 06-01-1950, 67 y.o.   MRN: [id number]  No chief complaint on file.   HPI Patient was seen today for acute concern.  Pt endorses cystic rosacea flare x2 weeks.  Pt endorses sore, red, swollen chin with bumps that drain.  Pt tried witch hazel, antibiotic wash, salt water on her skin which only made things worse.  Pt was previously seen at [facility] by Dr. Jefferson Fuel for this.  In the past pt tried numerous topical agents including metronidazole with no relief.  Pt is also done CO2 laser therapy which seems to help.  Pt made appt w/ [facility], to be seen in Dec.  Past Medical History:  Diagnosis Date  . Arthritis   . Bipolar 1 disorder ([place])   . CAD (coronary artery disease) 2007   CABG X 1  . Depression    controlled  . Hay fever   . Headache    Hx of migraines  . Heart murmur   . Hyperlipidemia   . Hypertension   . Idiopathic hypersomnia   . Mitral valve prolapse   . PONV (postoperative nausea and vomiting)   . Urinary incontinence     Allergies  Allergen Reactions  . Penicillins Anaphylaxis    Has patient had a PCN reaction causing immediate rash, facial/tongue/throat swelling, SOB or lightheadedness with hypotension: Yes Has patient had a PCN reaction causing severe rash involving mucus membranes or skin necrosis: No Has patient had a PCN reaction that required hospitalization Yes Has patient had a PCN reaction occurring within the last 10 years: No If all of the above answers are "NO", then may proceed with Cephalosporin use.  Marland Kitchen Bextra [Valdecoxib] Hives  . Codeine Nausea And Vomiting  . Lactose Intolerance (Gi) Diarrhea  . Meperidine Hcl Nausea And Vomiting  . Morphine Nausea And Vomiting  . Pentazocine Lactate     Muscle spasms     ROS General: Denies fever, chills, night sweats, changes in weight, changes in appetite HEENT: Denies headaches, ear pain, changes in vision, rhinorrhea, sore throat CV:  Denies CP, palpitations, SOB, orthopnea Pulm: Denies SOB, cough, wheezing GI: Denies abdominal pain, nausea, vomiting, diarrhea, constipation GU: Denies dysuria, hematuria, frequency, vaginal discharge Msk: Denies muscle cramps, joint pains Neuro: Denies weakness, numbness, tingling Skin: Denies rashes, bruising  +red, swollen, painful bumps on chin Psych: Denies depression, anxiety, hallucinations     Objective:    Blood pressure 126/78, pulse 70, temperature 98.9 F (37.2 C), temperature source Oral, weight 111 lb (50.3 kg), last menstrual period 01/10/1989, SpO2 97 %.   Gen. Pleasant, well-nourished, in no distress, normal affect   HEENT: [place]/AT, face symmetric, no scleral icterus, nares patent without drainage Lungs: no accessory muscle use Neuro:  A&Ox3, CN II-XII intact, normal gait Skin:  Warm, dry, intact.  Chin erythematous, edematous, with cystic nodule no drainage.  No lesions on cheeks.   Wt Readings from Last 3 Encounters:  08/24/17 111 lb (50.3 kg)  06/07/17 115 lb 3.2 oz (52.3 kg)  05/24/17 114 lb (51.7 kg)    Lab Results  Component Value Date   WBC 8.9 09/20/2016   HGB 14.3 09/20/2016   HCT 43.5 09/20/2016   PLT 319.0 09/20/2016   GLUCOSE 80 09/20/2016   CHOL 213 (H) 09/20/2016   TRIG 150.0 (H) 09/20/2016   HDL 113.20 09/20/2016   LDLCALC 70 09/20/2016   ALT 22 09/20/2016   AST 20 09/20/2016  NA 143 09/20/2016   K 4.2 09/20/2016   CL 102 09/20/2016   CREATININE 0.73 09/20/2016   BUN 29 (H) 09/20/2016   CO2 28 09/20/2016   TSH 1.60 09/20/2016    Assessment/Plan:  Rosacea  -pt advised to stop touching her face. -Contact inflammation resolves.  Consider restarting laser treatments. -Dermatologist in December. -Given 10 day course of tetracycline.  May require longer course.  Consider doxycycline for longer course. - Plan: tetracycline (ACHROMYCIN,SUMYCIN) 500 MG capsule  Follow-up PRN  Grier Mitts, MD

## 2017-09-10 IMAGING — US US BREAST 10
1 series · 12 of 12 positions shown · non-contrast
Comparison: Previous examinations the most recent of which is dated
03/12/2014.

CLINICAL DATA: Patient underwent today a right breast
ultrasound-guided core biopsy on 03/12/2014 for an area of
distortion noted within the right breast at the 10 o'clock position.
This demonstrated a complex sclerosing lesion without atypia.
Following surgical consultation, the patient elected imaging
followup evaluation rather than surgical excision. She now returns
and is asymptomatic.

EXAM:
2D DIGITAL DIAGNOSTIC BILATERAL MAMMOGRAM WITH CAD AND ADJUNCT TOMO
ULTRASOUND RIGHT BREAST

[Series 1: us breast 10 · 0.06mm/px · 12 of 12 slices shown]
[im 1/12]
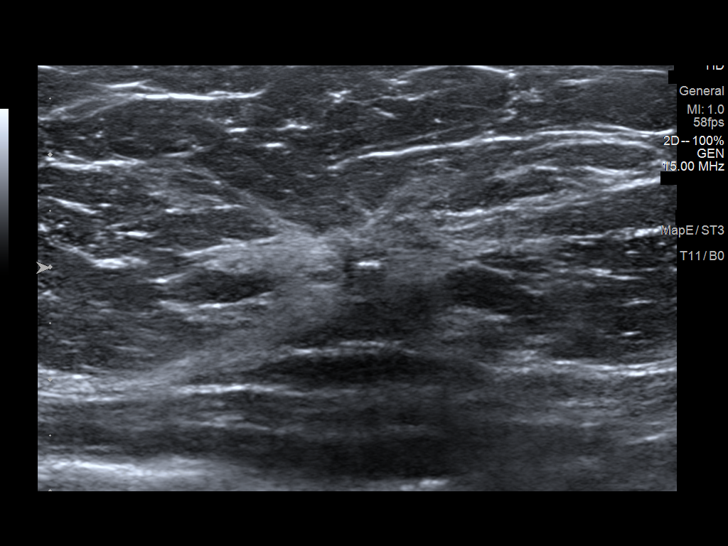
[im 2/12]
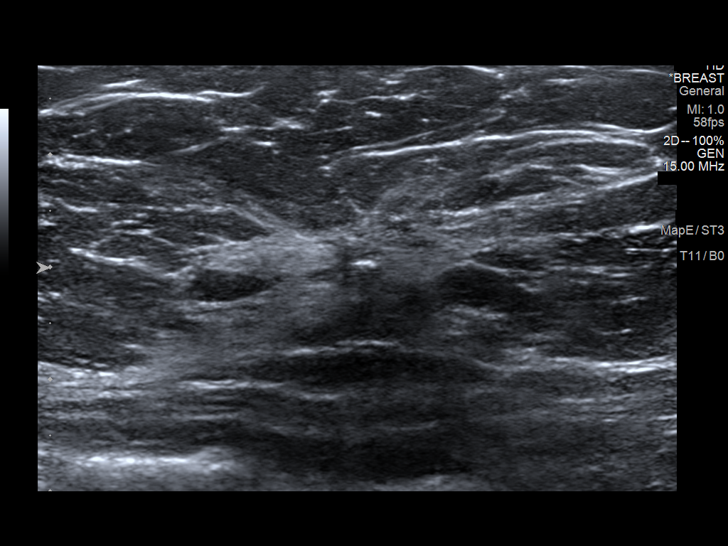
[im 3/12]
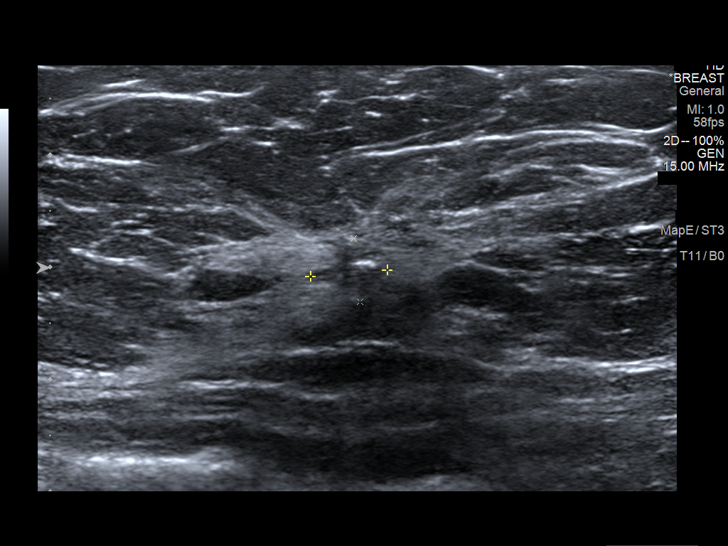
[im 4/12]
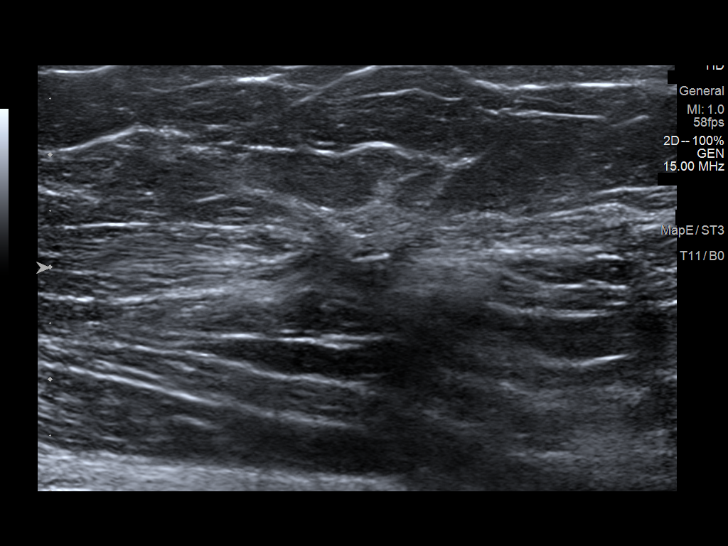
[im 5/12]
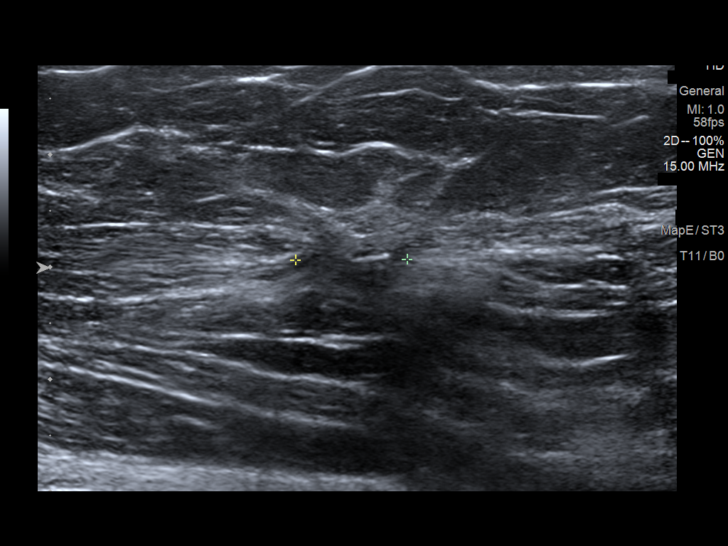
[im 6/12]
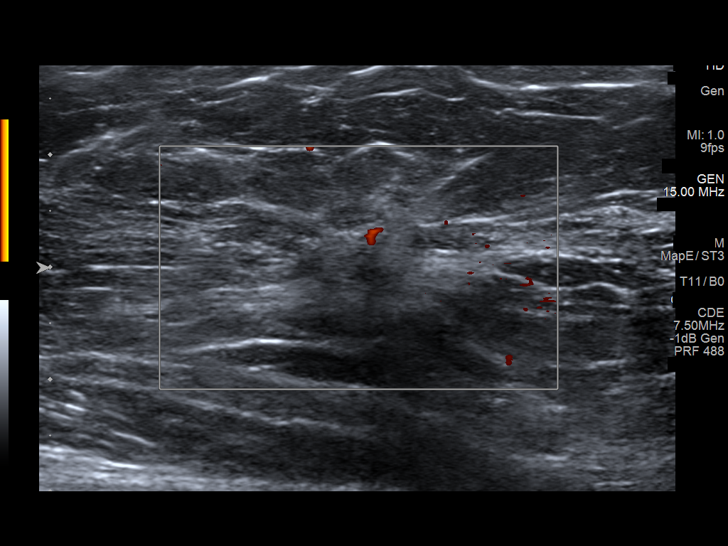
[im 7/12]
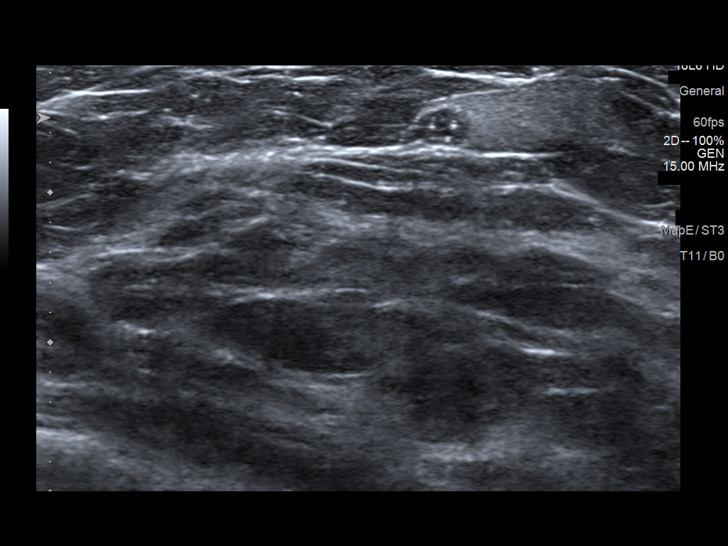
[im 8/12]
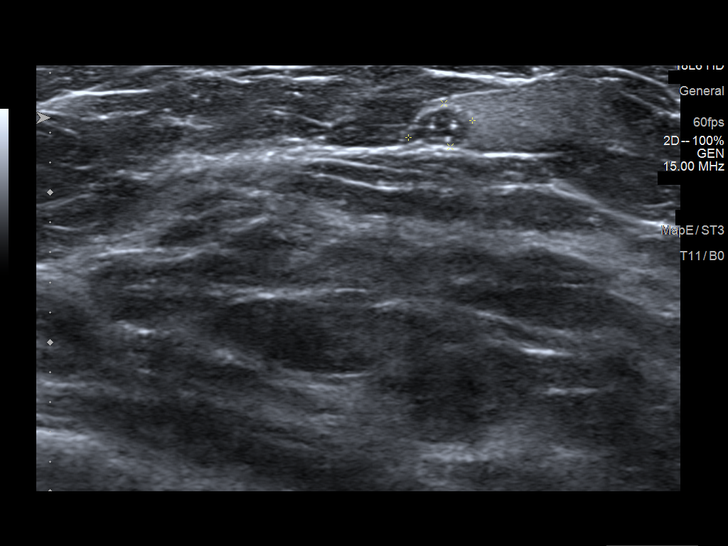
[im 9/12]
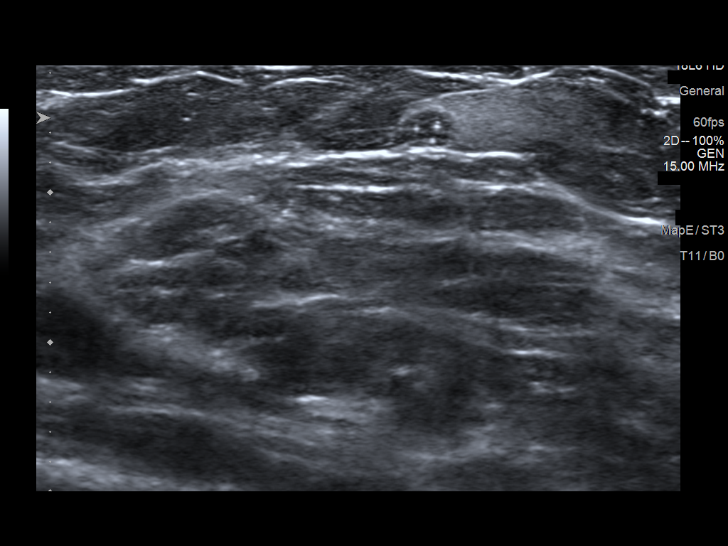
[im 10/12]
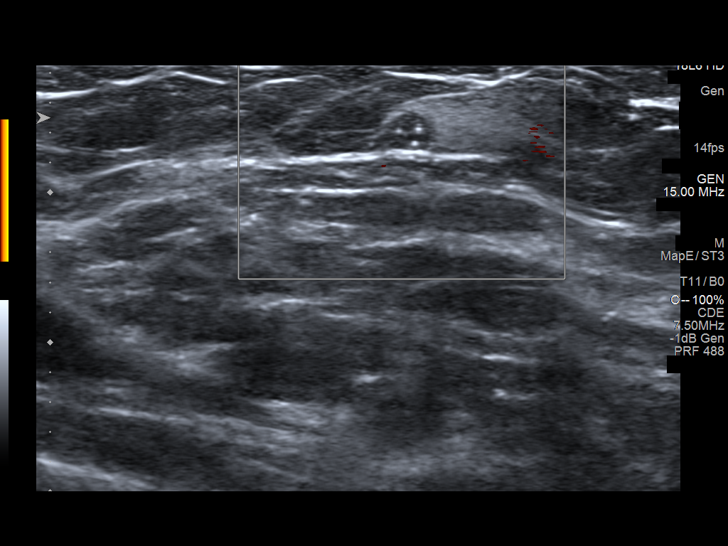
[im 11/12]
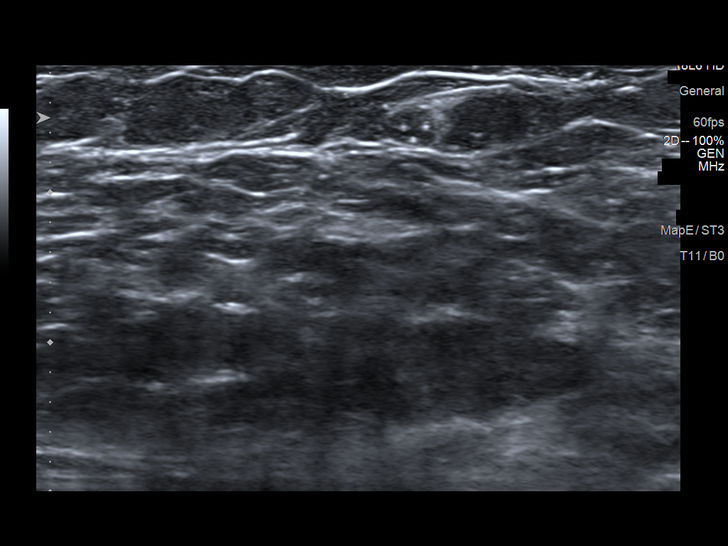
[im 12/12]
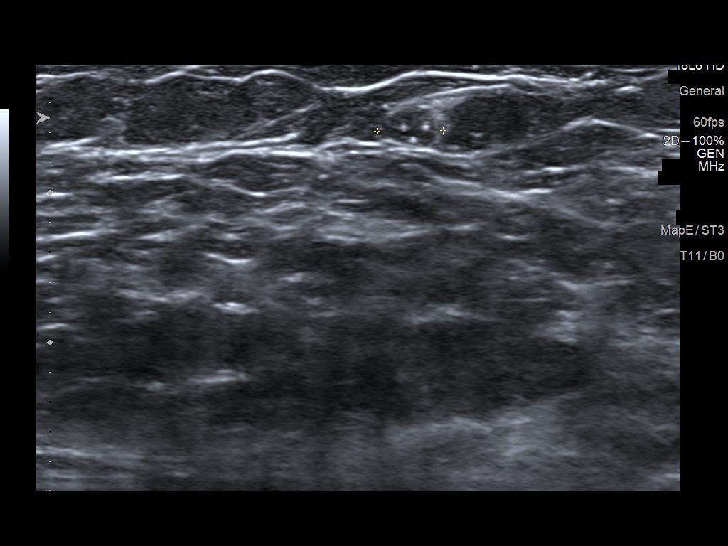

[12 of 12 positions shown; findings below may reference images not displayed]

ACR Breast Density Category c: The breast tissue is heterogeneously
dense, which may obscure small masses.
FINDINGS: There is an area of distortion present within the upper-outer
quadrant of the right breast at the 10 o'clock position with a
centrally located clip related patient's previous right breast
ultrasound-guided core biopsy. The central portion of the area of
distortion is slightly increased in density when compared with the
prior study. There is a small intramammary lymph node located within
the upper outer quadrant of the right breast which is stable in size
but has developed multiple small calcifications within the lymph
node. As a result, image guided biopsy of this intramammary lymph
node is recommended. The left breast is stable.

Mammographic images were processed with CAD.

On physical exam, there is no discrete palpable abnormality within
the upper outer quadrant of the right breast or within the right
axilla.

Targeted ultrasound is performed, showing an area of distortion with
a central irregular hypoechoic lesion located within the right
breast at the 10 o'clock position 5 cm from the nipple. A centrally
located clip is present related to the patient's imaging guided
biopsy. The mass now measures 10 x 7 x 6 mm in size and previously
measured 9 x 7 x 7 mm in size. There is an intramammary lymph node
present located within the right breast at the 9 o'clock position 7
cm from the nipple with internal calcifications. This measures 4 x 4
x 3 mm in size. As there are developing calcifications within this
lymph node, ultrasound-guided core biopsy is recommended. This will
be scheduled.
IMPRESSION: 1.Irregular mass with distortion located within the right breast at
the 10 o'clock position 5 cm from the nipple measuring 1 cm in size
and corresponding to the complex sclerosing lesion recently
diagnosed on the 03/12/2014 ultrasound-guided core biopsy.

2. 4 mm intramammary lymph node located within the right breast at
the 9 o'clock position with developing calcifications.
Ultrasound-guided core biopsy of this is recommended and will be
scheduled.

RECOMMENDATION:
Right breast ultrasound-guided core biopsy.

I have discussed the findings and recommendations with the patient.
Results were also provided in writing at the conclusion of the
visit. If applicable, a reminder letter will be sent to the patient
regarding the next appointment.

BI-RADS CATEGORY  4: Suspicious.

## 2017-09-11 IMAGING — US US RT BREAST BX
1 series · 13 of 18 positions shown · non-contrast
Comparison: Previous exam(s).

ADDENDUM:
Pathology revealed HEMANGIOMA of the Right breast at the 9:00
o'clock location. This was found to be concordant by Dr. Kunstdepot
Arbenser. Pathology results were discussed with the patient by
telephone. The patient reported doing well after the biopsy. Post
biopsy instructions and care were reviewed and questions were
answered. The patient was encouraged to call The [REDACTED] of
consultation has been arranged with [facility] C[facility]a [facility]ela at [facility]
breast complex sclerosing lesion.

Pathology results reported by Mirko Lidija Totz, RN on 05/07/2015.
CLINICAL DATA: The patient has a known right breast complex
sclerosing lesion located within the upper-outer quadrant of the
right breast at the 10 o'clock position. This has not been excised.
There is a small adjacent nodule which she has developed increasing
calcifications. Biopsy recommended.
EXAM:
ULTRASOUND GUIDED RIGHT BREAST CORE NEEDLE BIOPSY

[Series 1: us right breast bx · 0.04mm/px · 13 of 18 slices shown]
[im 1/18]
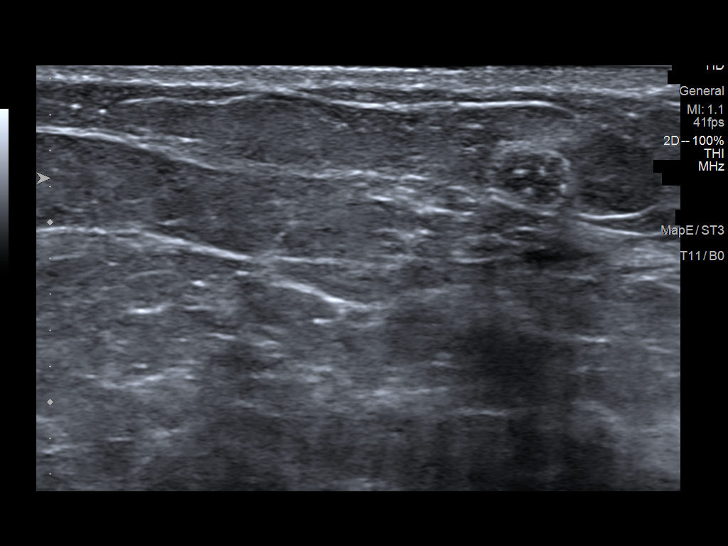
[im 3/18]
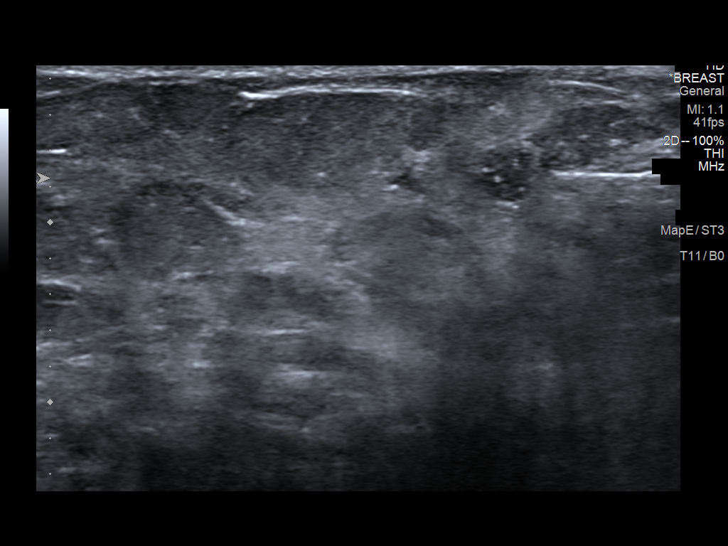
[im 4/18]
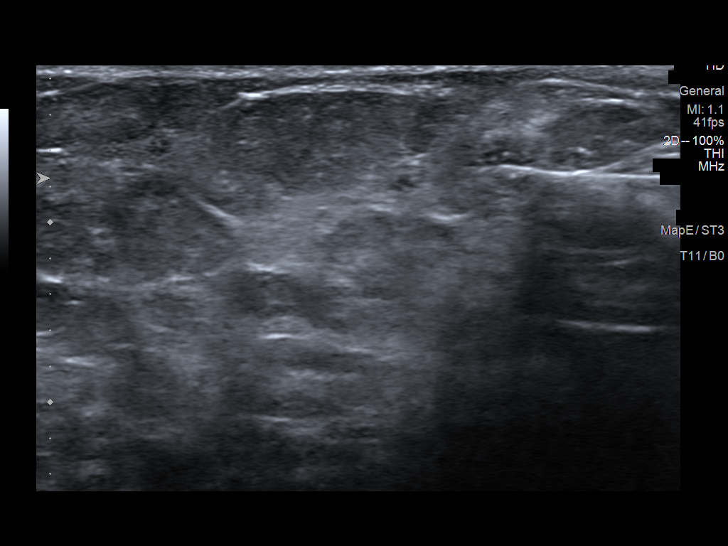
[im 5/18]
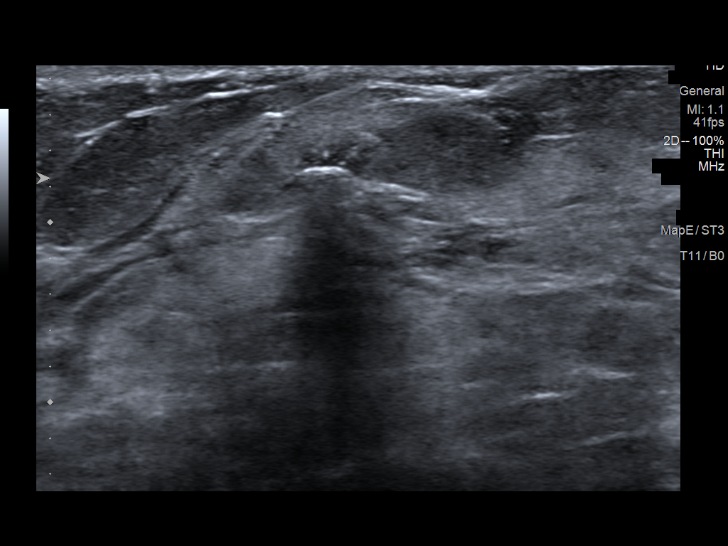
[im 7/18]
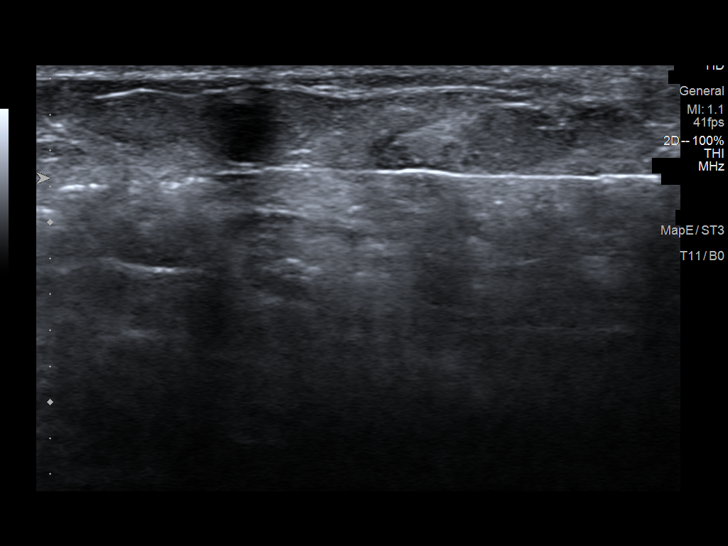
[im 8/18]
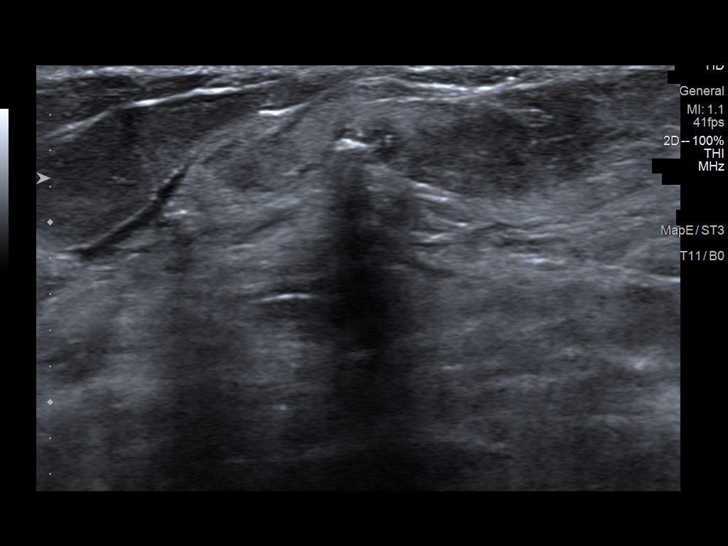
[im 10/18]
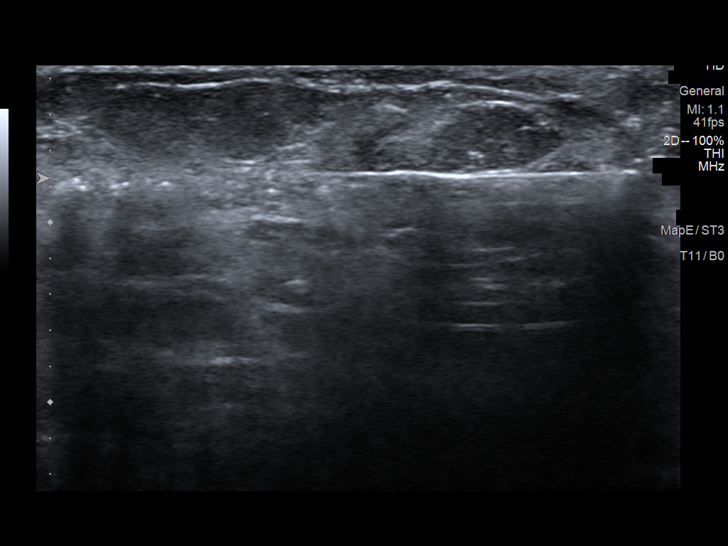
[im 11/18]
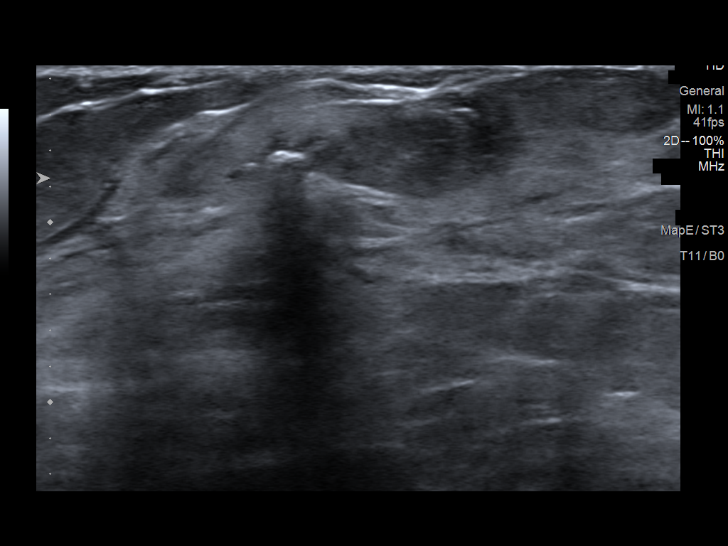
[im 12/18]
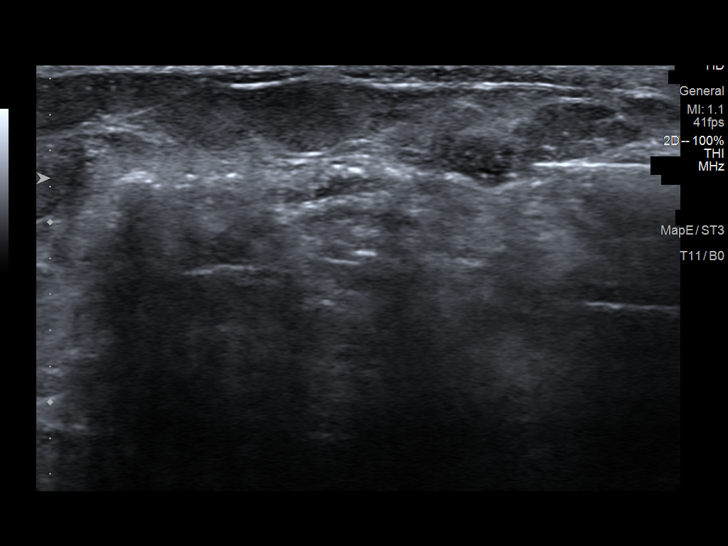
[im 14/18]
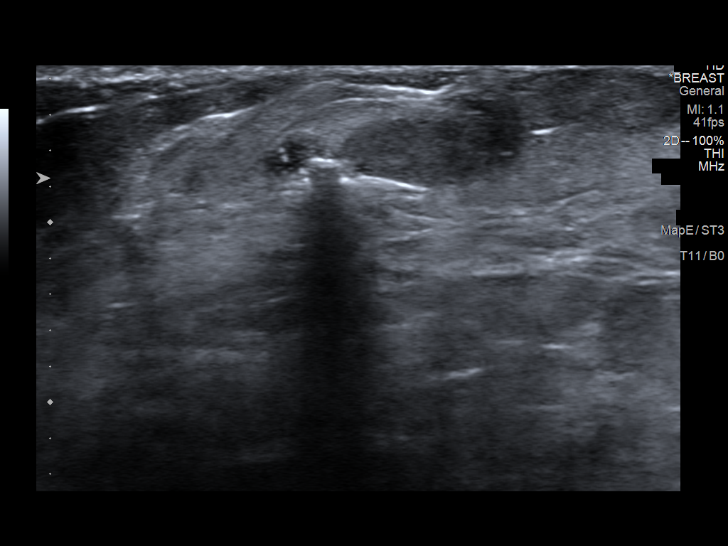
[im 15/18]
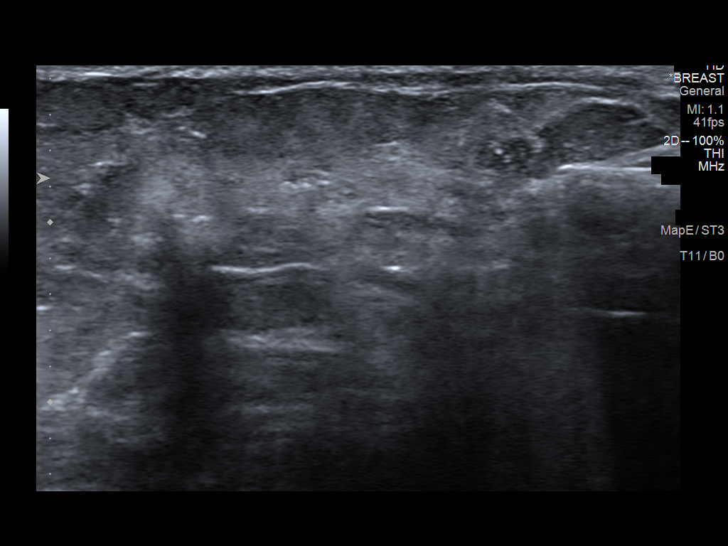
[im 16/18]
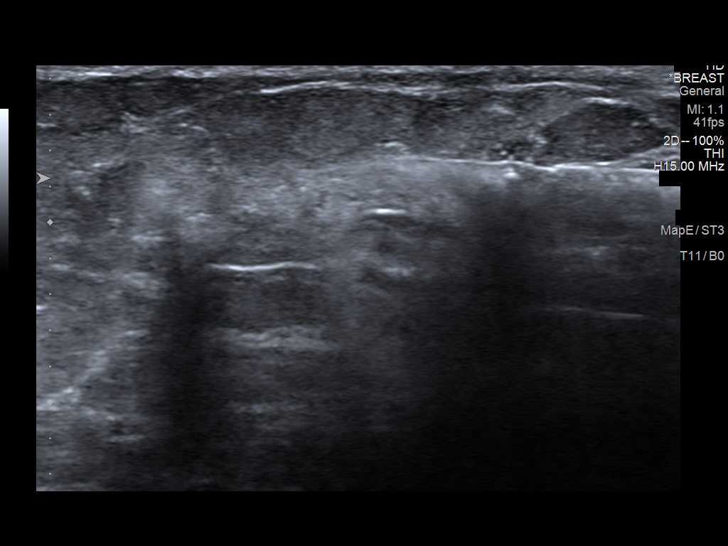
[im 18/18]
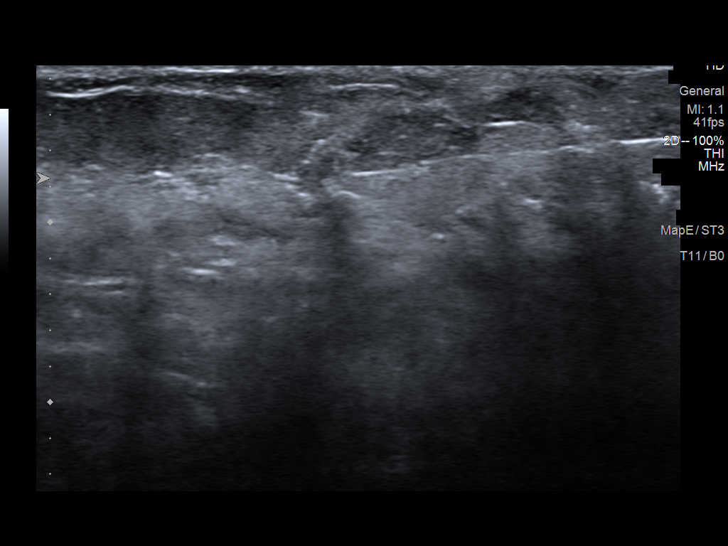

[13 of 18 positions shown; findings below may reference images not displayed]



Using sterile technique and 1% Lidocaine as local anesthetic, under
direct ultrasound visualization, a 14 gauge H M Mehedi device was
used to perform biopsy of the small mass with calcifications located
within the right breast at the 9 o'clock position using an
inferior/lateral approach. During the biopsy procedure, the mass
collapsed in size most consistent with a probable benign complicated
cyst. At the conclusion of the procedure a ribbon shaped tissue
marker clip was deployed into the biopsy cavity. Follow up 2 view
mammogram was performed and dictated separately.
IMPRESSION: Ultrasound guided biopsy of the small right breast mass located at
the 9 o'clock position as discussed above. No apparent
complications.

## 2017-09-12 DIAGNOSIS — F322 Major depressive disorder, single episode, severe without psychotic features: Secondary | ICD-10-CM | POA: Diagnosis not present

## 2017-09-13 ENCOUNTER — Encounter: Payer: Self-pay | Admitting: Family Medicine

## 2017-09-13 ENCOUNTER — Other Ambulatory Visit: Payer: Self-pay | Admitting: Obstetrics & Gynecology

## 2017-09-13 DIAGNOSIS — Z1231 Encounter for screening mammogram for malignant neoplasm of breast: Secondary | ICD-10-CM

## 2017-09-13 DIAGNOSIS — Z01419 Encounter for gynecological examination (general) (routine) without abnormal findings: Secondary | ICD-10-CM | POA: Diagnosis not present

## 2017-09-13 DIAGNOSIS — N958 Other specified menopausal and perimenopausal disorders: Secondary | ICD-10-CM | POA: Diagnosis not present

## 2017-09-13 DIAGNOSIS — M8588 Other specified disorders of bone density and structure, other site: Secondary | ICD-10-CM | POA: Diagnosis not present

## 2017-09-13 DIAGNOSIS — N63 Unspecified lump in unspecified breast: Secondary | ICD-10-CM | POA: Diagnosis not present

## 2017-09-25 ENCOUNTER — Telehealth: Payer: Self-pay | Admitting: Cardiovascular Disease

## 2017-09-25 MED ORDER — VALSARTAN 320 MG PO TABS: 320.0000 mg | ORAL_TABLET | Freq: Every day | ORAL | 1 refills | Status: DC

## 2017-09-25 NOTE — Telephone Encounter (Signed)
New Message:  Patient calling because she trying to get a refill on Ireesartan 300 mg her pharmacy told they do not know when they will

## 2017-09-25 NOTE — Telephone Encounter (Signed)
Spoke to patient, per pharmD change to valsartan 320 mg daily.  Called [facility] to confirm this is available, valsartan 320 mg is in stock.    Patient aware.  rx sent to pharmacy

## 2017-10-13 ENCOUNTER — Ambulatory Visit: Payer: Medicare Other

## 2017-11-02 ENCOUNTER — Ambulatory Visit: Payer: Medicare Other

## 2017-11-09 ENCOUNTER — Ambulatory Visit (INDEPENDENT_AMBULATORY_CARE_PROVIDER_SITE_OTHER): Payer: Medicare Other

## 2017-11-09 DIAGNOSIS — Z23 Encounter for immunization: Secondary | ICD-10-CM

## 2017-11-09 NOTE — Addendum Note (Signed)
Addended by: Gwenyth Ober R on: 11/09/2017 11:25 AM   Modules accepted: Orders

## 2017-11-21 ENCOUNTER — Telehealth: Payer: Self-pay

## 2017-11-21 NOTE — Telephone Encounter (Signed)
Pt received her PNA and Flu vaccine at the clinic

## 2017-11-23 ENCOUNTER — Ambulatory Visit: Payer: Medicare Other

## 2017-11-30 ENCOUNTER — Ambulatory Visit: Payer: Medicare Other

## 2017-12-11 ENCOUNTER — Ambulatory Visit (INDEPENDENT_AMBULATORY_CARE_PROVIDER_SITE_OTHER): Payer: Medicare Other | Admitting: Pulmonary Disease

## 2017-12-11 ENCOUNTER — Encounter: Payer: Self-pay | Admitting: Pulmonary Disease

## 2017-12-11 ENCOUNTER — Ambulatory Visit: Payer: Medicare Other | Admitting: Pulmonary Disease

## 2017-12-11 VITALS — BP 118/70 | HR 68 | Ht 60.0 in | Wt 114.8 lb

## 2017-12-11 DIAGNOSIS — G47419 Narcolepsy without cataplexy: Secondary | ICD-10-CM | POA: Diagnosis not present

## 2017-12-11 DIAGNOSIS — I2510 Atherosclerotic heart disease of native coronary artery without angina pectoris: Secondary | ICD-10-CM | POA: Diagnosis not present

## 2017-12-11 DIAGNOSIS — I2583 Coronary atherosclerosis due to lipid rich plaque: Secondary | ICD-10-CM | POA: Diagnosis not present

## 2017-12-11 MED ORDER — DEXTROAMPHETAMINE SULFATE 5 MG PO TABS: 5.0000 mg | ORAL_TABLET | Freq: Two times a day (BID) | ORAL | 0 refills | Status: DC

## 2017-12-11 NOTE — Patient Instructions (Signed)
Start dextroamphetamine 5 mg pill when you wake up in the morning and 5 mg pill between 1 and 2 pm in the afternoon  Stop provigil once you start using dextroamphetamine  You can discuss with Dr. Clovis Pu the following alternative medications for treating narcolepsy: 1) Pitolisant 2) Solriamfetol  Follow up in 3 months

## 2017-12-11 NOTE — Progress Notes (Signed)
[facility]  Chief Complaint  Patient presents with  . Follow-up    pt reports of increased sleepiness. does not feel that provigil is working well.     Constitutional:  BP 118/70 (BP Location: Left Arm, Cuff Size: Normal)   Pulse 68   Ht 5' (1.524 m)   Wt 114 lb 12.8 oz (52.1 kg)   LMP 01/10/1989 (Approximate)   SpO2 99%   BMI 22.42 kg/m   Past Medical History:  OA, CAD, Bipolar with depression, Migraine HA, HLD, HTN, MVP  Brief Summary:  Brandy [name] is a 67 y.o. 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  She is having more trouble staying awake.  She gets about 8 hours sleep per night.  Will nap for an hour in the morning.  She then naps again from 2 pm to 5 pm.  This is significantly interfering with her ability to keep up with her daily schedule.  She takes provigil in the morning.  This helps, but she is still very sleepy.  She seemed to have more alertness when on amphetamine derivate, but also had more issues with her mood.  This was also when she was on several other medications and was still working.  Her mood is doing much better now and she is no longer working.   Physical Exam:   Appearance - well kempt   ENMT - clear nasal mucosa, midline nasal  septum, no oral exudates, no LAN, trachea midline  Respiratory - normal chest wall, normal respiratory effort, no accessory muscle use, no wheeze/rales  CV - s1s2 regular rate and rhythm, no murmurs, no peripheral edema, radial pulses symmetric  GI - soft, non tender, no masses  Lymph - no adenopathy noted in neck and axillary areas  MSK - normal gait  Ext - no cyanosis, clubbing, or joint inflammation noted  Skin - no rashes, lesions, or ulcers  Neuro - normal strength, oriented x 3  Psych - normal mood and affect  Discussion:  She has progressive daytime sleepiness not controlled by modafinil.  Her mood disorder seems to be under good control.  She otherwise  responded better to stimulant effect of amphetamine derivative.  Assessment/Plan:   Narcolepsy w/o cataplexy. - will have her try dextroamphetamine 5 mg in the morning and 5 mg in the afternoon - she is to stop modafinil once she is using dextroamphetamine - advised her to d/w Dr. Clovis Pu about whether the newer agetns Pitolisant or Solriamfetol could be an option given her history of mood disorders  Depression. - she is followed by Dr. [name]   Patient Instructions  Start dextroamphetamine 5 mg pill when you wake up in the morning and 5 mg pill between 1 and 2 pm in the afternoon  Stop provigil once you start using dextroamphetamine  You can discuss with Dr. Clovis Pu the following alternative medications for treating narcolepsy: 1) Pitolisant 2) Solriamfetol  Follow up in 3 months    [name], MD [place] Pager: [phone] 12/11/2017, 11:33 AM  Flow Sheet    Sleep tests:  PSG 04/30/07 >>AHI 0.5, PLMI 5.2  MSLT 04/30/07 >> mean sleep latency 4 min, 5/5 naps, 0/5 SOREM  Medications:   Allergies as of 12/11/2017      Reactions   Penicillins Anaphylaxis   Has patient had a PCN reaction causing immediate rash, facial/tongue/throat swelling, SOB or lightheadedness with hypotension: Yes Has patient had a PCN reaction causing severe rash involving mucus  membranes or skin necrosis: No Has patient had a PCN reaction that required hospitalization Yes Has patient had a PCN reaction occurring within the last 10 years: No If all of the above answers are "NO", then may proceed with Cephalosporin use.   Bextra [valdecoxib] Hives   Codeine Nausea And Vomiting   Lactose Intolerance (gi) Diarrhea   Meperidine Hcl Nausea And Vomiting   Morphine Nausea And Vomiting   Pentazocine Lactate    Muscle spasms       Medication List        Accurate as of 12/11/17 11:33 AM. Always use your most recent med list.          acetaminophen 650 MG CR  tablet Commonly known as:  TYLENOL Take 650 mg by mouth every 8 (eight) hours as needed for pain.   amLODipine 2.5 MG tablet Commonly known as:  NORVASC Take 1 tablet (2.5 mg total) by mouth daily.   CALCIUM 500+D 500-400 MG-UNIT Tabs Generic drug:  Calcium Carb-Cholecalciferol Take 2 tablets by mouth daily.   dextroamphetamine 5 MG tablet Commonly known as:  DEXTROSTAT Take 1 tablet (5 mg total) by mouth 2 (two) times daily.   famotidine 20 MG tablet Commonly known as:  PEPCID Take 20 mg by mouth daily.   FLUoxetine 20 MG tablet Commonly known as:  PROZAC Take 20 mg by mouth daily.   fluticasone 50 MCG/ACT nasal spray Commonly known as:  FLONASE Place 2 sprays into both nostrils daily.   mometasone 0.1 % cream Commonly known as:  ELOCON as needed.   MULTIPLE VITAMINS/WOMENS tablet Take 1 tablet by mouth daily.   naproxen 250 MG tablet Commonly known as:  NAPROSYN Take 250 mg by mouth 1 day or 1 dose.   valsartan 320 MG tablet Commonly known as:  DIOVAN Take 1 tablet (320 mg total) by mouth daily.       Past Surgical History:  She  has a past surgical history that includes trach; myomectomy (1981); Vesicovaginal fistula closure w/ TAH; Rotator cuff repair (1998); Coronary artery bypass graft (03/21/05); Shoulder arthroscopy (2006); Total abdominal hysterectomy (1991); cataract surgery; Total hip arthroplasty (Left, 03/01/2016); and reconstructive surgery (1945).  Family History:  Her family history includes Alcohol abuse in her maternal grandfather; Aneurysm in her mother; Arthritis in her maternal grandmother; Bipolar disorder in her mother and unknown relative; Hypertension in her mother and unknown relative; Kidney disease in her mother; Lung cancer in her father; Osteoporosis in her maternal grandmother; Stroke in her mother.  Social History:  She  reports that she has never smoked. She has never used smokeless tobacco. She reports that she does not drink  alcohol or use drugs.

## 2018-01-08 ENCOUNTER — Telehealth: Payer: Self-pay | Admitting: Pulmonary Disease

## 2018-01-08 NOTE — Telephone Encounter (Signed)
Called patient, unable to reach left message to give us a call back. 

## 2018-01-09 NOTE — Telephone Encounter (Signed)
Called and spoke with pt who is needing a refill of Dextrostat. Pt stated to me that this med has really been helping her but she stated that she believes she will need more than 5mg  as she stated she still feels tired on the 5mg  and has even taken 10mg  and still feels tired on that mg. Pt stated that she feels like she is going to need 15mg  of the med to make it where she does not still feel as tired on the med.   Pt has been made aware that VS is out of office until 01/15/18 and stated to her when he returned to office, we would have him address the med.  Dr. Halford Chessman, please advise if you are okay with pt having the Dextrostat refilled and if it is okay for pt to have dose changed to 15mg  from 5mg ? If you are okay with this, can you electronically send the Rx to [facility]  off [address] for pt? Thanks!

## 2018-01-12 NOTE — Telephone Encounter (Signed)
Attempted to contact pt. I did not receive an answer. I have left a message for pt to return our call.  

## 2018-01-12 NOTE — Telephone Encounter (Signed)
Message will be routed to the APP of the day as Dr. Halford Chessman is on vacation.

## 2018-01-12 NOTE — Telephone Encounter (Signed)
Pt is returning call. Cb is [phone].

## 2018-01-12 NOTE — Telephone Encounter (Signed)
Pt needs a new prescription of Dextrostat [facility] [address]

## 2018-01-12 NOTE — Telephone Encounter (Signed)
This is too soon for the patient receive a refill.  Patient's last refill was 12/14/2017.  If patient feels that dosing needs to be changed she can discuss that with Dr. Halford Chessman.  We will leave this pending telephone note open for him to see if he is comfortable adjusting that dose.  As of right now there will be no refills or medication changes at this time.  We can wait for 01/15/2018 -and can route to [place] or whoever will be working with Dr. Halford Chessman on that day as an [place].  I have checked [place] PMP aware and the patients overdose risk score is 110.  Patient's last prescription was on 12/14/2017.  This was a 30-day prescription.  She cannot receive more until at least 01/14/2018.  Patient can have this addressed by Dr. Halford Chessman on 01/15/2018.  Please route to Dr. Halford Chessman as Juluis Rainier as well as to the nurse will be working with him on 01/15/2018.  Wyn Quaker, FNP

## 2018-01-15 NOTE — Telephone Encounter (Signed)
Attempted to call Patient. Left detailed message on VM to let her know that Dr. Halford Chessman was in the office today and we were sending her message to him, someone will call her back this afternoon, once he has responded.    Will route to Dr. Halford Chessman

## 2018-01-15 NOTE — Telephone Encounter (Signed)
Okay to change prescription for dextroamphetamine to 15 mg daily.

## 2018-01-15 NOTE — Telephone Encounter (Signed)
Patient returned phone call.  Patient requesting refill for Dextroamphetamine 15 mg.

## 2018-01-15 NOTE — Telephone Encounter (Signed)
Attempted to call pt but unable to reach her. Left message for pt to return call.  Routing back to Dr. Halford Chessman for him to follow up on this. Dr. Halford Chessman, looking at pt's last Rx of the 5mg  tablet, this was printed so we need to make sure the order class is changed from print to normal so it can be electronically sent to pt's pharmacy by you if you are okay with sending the Rx for pt to pharmacy. And pt was wanting this to be changed to 15mg  and have it sent to [facility]  off  [address].

## 2018-01-15 NOTE — Telephone Encounter (Signed)
Dr. Halford Chessman, can you electronically send pt's Rx to [facility] off [address] for pt for the dextroamphetamine 15mg  tab for pt? Thank you

## 2018-01-15 NOTE — Telephone Encounter (Signed)
Pt is returning call. Per pt, she is out of medication. Cb is [phone].

## 2018-01-17 NOTE — Telephone Encounter (Signed)
Pt is calling back [phone]

## 2018-01-17 NOTE — Telephone Encounter (Signed)
Spoke with the pt  She states she has scheduled appt with VS for this wk and will discuss meds at this time  She wants to discuss increasing her dose to a total of 15 mg per day and also discuss how to get med escribed without delays in the future  Nothing further needed today per the pt

## 2018-01-19 ENCOUNTER — Encounter: Payer: Self-pay | Admitting: Pulmonary Disease

## 2018-01-19 ENCOUNTER — Ambulatory Visit (INDEPENDENT_AMBULATORY_CARE_PROVIDER_SITE_OTHER): Payer: Medicare Other | Admitting: Pulmonary Disease

## 2018-01-19 VITALS — BP 124/76 | HR 76 | Ht 64.0 in | Wt 116.0 lb

## 2018-01-19 DIAGNOSIS — G47419 Narcolepsy without cataplexy: Secondary | ICD-10-CM | POA: Diagnosis not present

## 2018-01-19 MED ORDER — DEXTROAMPHETAMINE SULFATE 5 MG PO TABS: 15.0000 mg | ORAL_TABLET | Freq: Every day | ORAL | 0 refills | Status: DC

## 2018-01-19 NOTE — Patient Instructions (Signed)
Dextroamphetamine 5 mg pill >> take 3 pills daily in the morning  Follow up in 4 months

## 2018-01-19 NOTE — Progress Notes (Signed)
[facility]  Chief Complaint  Patient presents with  . Follow-up    Pt has concerns and questions about medication today.    Constitutional:  BP 124/76 (BP Location: Left Arm, Cuff Size: Normal)   Pulse 76   Ht 5\' 4"  (1.626 m)   Wt 116 lb (52.6 kg)   LMP 01/10/1989 (Approximate)   SpO2 99%   BMI 19.91 kg/m   Past Medical History:  OA, CAD, Bipolar with depression, Migraine HA, HLD, HTN, MVP  Brief Summary:  Brandy [name] is a 68 y.o. 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  She felt that dextroamphetamine worked much better than modafinil.  She was feeling more upbeat and energetic, but wasn't feeling manic. She tried going of prozac, but this didn't make any difference.  She feels that 10 mg of dextroamphetamine was good, but still felt sleepy.  She tried 15 mg and did much better.  Wasn't having headache, nausea, chest pain, palpitations, or tremors.    Physical Exam:   Appearance - well kempt   ENMT - no sinus tenderness, no nasal discharge, no oral exudate  Neck - no masses, trachea midline, no thyromegaly, no elevation in JVP  Respiratory - normal appearance of chest wall, normal respiratory effort w/o accessory muscle use, no wheezing or rales  CV - s1s2 regular rate and rhythm, no murmurs, no peripheral edema, radial pulses symmetric  GI - soft, non tender  Lymph - no adenopathy noted in neck and axillary areas  MSK - normal gait  Ext - no cyanosis, clubbing, or joint inflammation noted  Skin - no rashes, lesions, or ulcers  Neuro - normal strength, oriented x 3  Psych - normal mood and affect   Assessment/Plan:   Narcolepsy w/o cataplexy. - will increase dose of dextroamphetamine to 15 mg daily - advised that she could use less medication on certain days as she felt appropriate  Depression. - she is followed by Dr. [name]   Patient Instructions  Dextroamphetamine 5 mg pill >> take 3 pills daily  in the morning  Follow up in 4 months    [name], MD [place] Pager: [phone] 01/19/2018, 12:53 PM  Flow Sheet    Sleep tests:  PSG 04/30/07 >>AHI 0.5, PLMI 5.2  MSLT 04/30/07 >> mean sleep latency 4 min, 5/5 naps, 0/5 SOREM  Medications:   Allergies as of 01/19/2018      Reactions   Penicillins Anaphylaxis   Has patient had a PCN reaction causing immediate rash, facial/tongue/throat swelling, SOB or lightheadedness with hypotension: Yes Has patient had a PCN reaction causing severe rash involving mucus membranes or skin necrosis: No Has patient had a PCN reaction that required hospitalization Yes Has patient had a PCN reaction occurring within the last 10 years: No If all of the above answers are "NO", then may proceed with Cephalosporin use.   Bextra [valdecoxib] Hives   Codeine Nausea And Vomiting   Lactose Intolerance (gi) Diarrhea   Meperidine Hcl Nausea And Vomiting   Morphine Nausea And Vomiting   Pentazocine Lactate    Muscle spasms       Medication List       Accurate as of January 19, 2018 12:53 PM. Always use your most recent med list.        acetaminophen 650 MG CR tablet Commonly known as:  TYLENOL Take 650 mg by mouth every 8 (eight) hours as needed for pain.   amLODipine 2.5  MG tablet Commonly known as:  NORVASC Take 1 tablet (2.5 mg total) by mouth daily.   CALCIUM 500+D 500-400 MG-UNIT Tabs Generic drug:  Calcium Carb-Cholecalciferol Take 2 tablets by mouth daily.   dextroamphetamine 5 MG tablet Commonly known as:  DEXTROSTAT Take 3 tablets (15 mg total) by mouth daily.   famotidine 20 MG tablet Commonly known as:  PEPCID Take 20 mg by mouth daily.   FLUoxetine 20 MG tablet Commonly known as:  PROZAC Take 20 mg by mouth daily.   fluticasone 50 MCG/ACT nasal spray Commonly known as:  FLONASE Place 2 sprays into both nostrils daily.   mometasone 0.1 % cream Commonly known as:  ELOCON as needed.     MULTIPLE VITAMINS/WOMENS tablet Take 1 tablet by mouth daily.   naproxen 250 MG tablet Commonly known as:  NAPROSYN Take 250 mg by mouth 1 day or 1 dose.   valsartan 320 MG tablet Commonly known as:  DIOVAN Take 1 tablet (320 mg total) by mouth daily.       Past Surgical History:  She  has a past surgical history that includes trach; myomectomy (1981); Vesicovaginal fistula closure w/ TAH; Rotator cuff repair (1998); Coronary artery bypass graft (03/21/05); Shoulder arthroscopy (2006); Total abdominal hysterectomy (1991); cataract surgery; Total hip arthroplasty (Left, 03/01/2016); and reconstructive surgery (1945).  Family History:  Her family history includes Alcohol abuse in her maternal grandfather; Aneurysm in her mother; Arthritis in her maternal grandmother; Bipolar disorder in her mother and another family member; Hypertension in her mother and another family member; Kidney disease in her mother; Lung cancer in her father; Osteoporosis in her maternal grandmother; Stroke in her mother.  Social History:  She  reports that she has never smoked. She has never used smokeless tobacco. She reports that she does not drink alcohol or use drugs.

## 2018-02-10 DIAGNOSIS — L718 Other rosacea: Secondary | ICD-10-CM | POA: Diagnosis not present

## 2018-02-12 ENCOUNTER — Other Ambulatory Visit: Payer: Self-pay

## 2018-02-12 ENCOUNTER — Telehealth: Payer: Self-pay | Admitting: Pulmonary Disease

## 2018-02-12 NOTE — Telephone Encounter (Signed)
Dr. Halford Chessman, please advise if it is okay to refill med for pt. Thanks!

## 2018-02-12 NOTE — Telephone Encounter (Signed)
Patient called requesting a refill of Dextroamphetamine, [facility].  Last refill was 01/19/18, #90.   Dr Halford Chessman, please advise on refill

## 2018-02-12 NOTE — Telephone Encounter (Signed)
VS please advise on pt's rx request:  So I don't run out while I'm on vacation, please e-script a new prescription for dextroamphetamine 5mg  (3 tabs daily) to [facility] on [address] in [place]. Could your nurse let me know when complete? Thanks, Brandy [name]

## 2018-02-14 ENCOUNTER — Other Ambulatory Visit: Payer: Self-pay | Admitting: Pulmonary Disease

## 2018-02-14 MED ORDER — DEXTROAMPHETAMINE SULFATE 5 MG PO TABS: 15.0000 mg | ORAL_TABLET | Freq: Every day | ORAL | 0 refills | Status: DC

## 2018-02-14 NOTE — Progress Notes (Signed)
Refill dextroamphetamine 15 mg daily.

## 2018-02-14 NOTE — Telephone Encounter (Signed)
I sent script to pharmacy electronically.

## 2018-02-14 NOTE — Telephone Encounter (Signed)
Called patient unable to reach left message to give us a call back.

## 2018-02-15 NOTE — Telephone Encounter (Signed)
Pt is aware of below message and voiced her understanding. Nothing further is needed. 

## 2018-02-16 ENCOUNTER — Telehealth: Payer: Self-pay | Admitting: Pulmonary Disease

## 2018-02-16 NOTE — Telephone Encounter (Signed)
Called patient, unable to reach left message to give us a call back. 

## 2018-02-19 NOTE — Telephone Encounter (Signed)
Called and left detailed message on VM for Patient to call back.  Dextroamphetamine prescription sent to [facility], [place], [place].

## 2018-02-20 NOTE — Telephone Encounter (Signed)
Called patient, unable to reach left message to give us a call back. 

## 2018-02-21 NOTE — Telephone Encounter (Signed)
Called patient, unable to reach left message to give [place] a call back. Per protocol will close encounter.

## 2018-02-27 ENCOUNTER — Ambulatory Visit (INDEPENDENT_AMBULATORY_CARE_PROVIDER_SITE_OTHER): Payer: Medicare Other | Admitting: Psychiatry

## 2018-02-27 ENCOUNTER — Encounter: Payer: Self-pay | Admitting: Psychiatry

## 2018-02-27 VITALS — BP 175/90 | HR 75

## 2018-02-27 DIAGNOSIS — G4710 Hypersomnia, unspecified: Secondary | ICD-10-CM | POA: Diagnosis not present

## 2018-02-27 DIAGNOSIS — F39 Unspecified mood [affective] disorder: Secondary | ICD-10-CM | POA: Diagnosis not present

## 2018-02-27 MED ORDER — FLUOXETINE HCL 20 MG PO TABS: 20.0000 mg | ORAL_TABLET | Freq: Every day | ORAL | 1 refills | Status: DC

## 2018-02-27 NOTE — Progress Notes (Signed)
Brandy [name] [id number] [birth year] 68 y.o.  Subjective:   Patient ID:  Brandy [name] is a 68 y.o. (DOB 06/18/1950) female.  Chief Complaint:  Chief Complaint  Patient presents with  . Follow-up    Medication management  . hypersomnolence   Last seen September HPI Brandy [name] presents to the office today for follow-up of mood and above.  Hyper somnolence a big problem and sleep doc rx retrial Adderall 15 BID.  Initially helped a lot and became somewhat tolerant.  But it still helps.  Didn't make her hyperverbal and hyperactive like in the past.  Has been very careful with the dosage.  Enjoyed ski trip.  Patient reports stable mood and denies depressed or irritable moods.  Patient denies any recent difficulty with anxiety.   Denies appetite disturbance.  Patient reports that energy and motivation have been better.  Patient denies any difficulty with concentration.  Patient denies any suicidal ideation. No history of cataplexy.  Retired 2013.  Works on being active. Sings in choir.  Past psych meds:  Fluoxetine,  Lamotrigine 600, Latuda, lithium, duloxetine, Abilify 5, Depakote 1000, Wellbutrin, Provigil, Celexa 40, Seroquel sedation, Cerefolin NAC, various stimulants, Trintellix, Trintellix, Viibryd 40, duloxetine, Restoril  Review of Systems:  Review of Systems  Musculoskeletal: Positive for arthralgias.  Neurological: Negative for tremors and weakness.  Psychiatric/Behavioral: Positive for sleep disturbance. Negative for agitation, behavioral problems, confusion, decreased concentration, dysphoric mood, hallucinations, self-injury and suicidal ideas. The patient is not nervous/anxious and is not hyperactive.     Medications: I have reviewed the patient's current medications.  Current Outpatient Medications  Medication Sig Dispense Refill  . Calcium Carb-Cholecalciferol (CALCIUM 500+D) 500-400 MG-UNIT TABS Take 2 tablets by mouth daily.    . Cholecalciferol  (VITAMIN D3) 125 MCG (5000 UT) CAPS Take 1 capsule by mouth daily.    . clindamycin (CLEOCIN T) 1 % lotion APPLY TO THE AFFECTED AREA 2 TIMES DAILY FOR 7 DAYS    . dextroamphetamine (DEXTROSTAT) 5 MG tablet Take 3 tablets (15 mg total) by mouth daily. 90 tablet 0  . famotidine (PEPCID) 20 MG tablet Take 20 mg by mouth daily.    Marland Kitchen FLUoxetine (PROZAC) 20 MG tablet Take 20 mg by mouth daily.    . fluticasone (FLONASE) 50 MCG/ACT nasal spray Place 2 sprays into both nostrils daily. 16 g 2  . Multiple Vitamins-Minerals (MULTIPLE VITAMINS/WOMENS) tablet Take 1 tablet by mouth daily.    . naproxen (NAPROSYN) 250 MG tablet Take 250 mg by mouth 1 day or 1 dose.    . valsartan (DIOVAN) 320 MG tablet Take 1 tablet (320 mg total) by mouth daily. 90 tablet 1  . mometasone (ELOCON) 0.1 % cream as needed.     Current Facility-Administered Medications  Medication Dose Route Frequency Provider Last Rate Last Dose  . ipratropium-albuterol (DUONEB) 0.5-2.5 (3) MG/3ML nebulizer solution 3 mL  3 mL Nebulization Once Nafziger, Tommi Rumps, NP        Medication Side Effects: None  Allergies:  Allergies  Allergen Reactions  . Penicillins Anaphylaxis    Has patient had a PCN reaction causing immediate rash, facial/tongue/throat swelling, SOB or lightheadedness with hypotension: Yes Has patient had a PCN reaction causing severe rash involving mucus membranes or skin necrosis: No Has patient had a PCN reaction that required hospitalization Yes Has patient had a PCN reaction occurring within the last 10 years: No If all of the above answers are "NO", then may proceed with Cephalosporin use.  Marland Kitchen  Bextra [Valdecoxib] Hives  . Codeine Nausea And Vomiting  . Lactose Intolerance (Gi) Diarrhea  . Meperidine Hcl Nausea And Vomiting  . Morphine Nausea And Vomiting  . Pentazocine Lactate     Muscle spasms     Past Medical History:  Diagnosis Date  . Arthritis   . Bipolar 1 disorder ([place])   . CAD (coronary artery disease)  2007   CABG X 1  . Depression    controlled  . Hay fever   . Headache    Hx of migraines  . Heart murmur   . Hyperlipidemia   . Hypertension   . Idiopathic hypersomnia   . Mitral valve prolapse   . PONV (postoperative nausea and vomiting)   . Urinary incontinence     Family History  Problem Relation Age of Onset  . Stroke Mother        died 69  . Aneurysm Mother   . Hypertension Mother   . Kidney disease Mother   . Bipolar disorder Mother   . Lung cancer Father        died 66  . Bipolar disorder Other   . Hypertension Other   . Osteoporosis Maternal Grandmother   . Arthritis Maternal Grandmother   . Alcohol abuse Maternal Grandfather     Social History   Socioeconomic History  . Marital status: Divorced    Spouse name: Not on file  . Number of children: Not on file  . Years of education: Not on file  . Highest education level: Not on file  Occupational History  . Occupation: retired  Scientific laboratory technician  . Financial resource strain: Not on file  . Food insecurity:    Worry: Not on file    Inability: Not on file  . Transportation needs:    Medical: Not on file    Non-medical: Not on file  Tobacco Use  . Smoking status: Never Smoker  . Smokeless tobacco: Never Used  Substance and Sexual Activity  . Alcohol use: No  . Drug use: No  . Sexual activity: Yes  Lifestyle  . Physical activity:    Days per week: Not on file    Minutes per session: Not on file  . Stress: Not on file  Relationships  . Social connections:    Talks on phone: Not on file    Gets together: Not on file    Attends religious service: Not on file    Active member of club or organization: Not on file    Attends meetings of clubs or organizations: Not on file    Relationship status: Not on file  . Intimate partner violence:    Fear of current or ex partner: Not on file    Emotionally abused: Not on file    Physically abused: Not on file    Forced sexual activity: Not on file  Other  Topics Concern  . Not on file  Social History Narrative  . Not on file    Past Medical History, Surgical history, Social history, and Family history were reviewed and updated as appropriate.   Please see review of systems for further details on the patient's review from today.   Objective:   Physical Exam:  BP (!) 175/90 (BP Location: Left Arm)   Pulse 75   LMP 01/10/1989 (Approximate)   Physical Exam Constitutional:      General: She is not in acute distress.    Appearance: She is well-developed.  Musculoskeletal:  General: No deformity.  Neurological:     Mental Status: She is alert and oriented to person, place, and time.     Motor: No tremor.     Coordination: Coordination normal.     Gait: Gait normal.  Psychiatric:        Attention and Perception: Attention normal. She is attentive.        Mood and Affect: Mood normal. Mood is not anxious or depressed. Affect is not labile, blunt, angry or inappropriate.        Speech: Speech normal.        Behavior: Behavior normal.        Thought Content: Thought content normal. Thought content does not include homicidal or suicidal ideation. Thought content does not include homicidal or suicidal plan.        Cognition and Memory: Cognition normal.        Judgment: Judgment normal.     Comments: Insight is good. HYper style no worse.     Lab Review:     Component Value Date/Time   NA 143 09/20/2016 1644   K 4.2 09/20/2016 1644   CL 102 09/20/2016 1644   CO2 28 09/20/2016 1644   GLUCOSE 80 09/20/2016 1644   BUN 29 (H) 09/20/2016 1644   CREATININE 0.73 09/20/2016 1644   CREATININE 0.70 08/11/2015 1155   CALCIUM 10.1 09/20/2016 1644   PROT 6.8 09/20/2016 1644   ALBUMIN 4.6 09/20/2016 1644   AST 20 09/20/2016 1644   ALT 22 09/20/2016 1644   ALKPHOS 104 09/20/2016 1644   BILITOT 0.5 09/20/2016 1644   GFRNONAA >60 03/02/2016 0455   GFRAA >60 03/02/2016 0455       Component Value Date/Time   WBC 8.9 09/20/2016  1644   RBC 4.71 09/20/2016 1644   HGB 14.3 09/20/2016 1644   HCT 43.5 09/20/2016 1644   PLT 319.0 09/20/2016 1644   MCV 92.3 09/20/2016 1644   MCH 28.2 03/02/2016 0455   MCHC 33.0 09/20/2016 1644   RDW 13.8 09/20/2016 1644   LYMPHSABS 1.9 09/20/2016 1644   MONOABS 0.5 09/20/2016 1644   EOSABS 0.0 09/20/2016 1644   BASOSABS 0.1 09/20/2016 1644    No results found for: POCLITH, LITHIUM   No results found for: PHENYTOIN, PHENOBARB, VALPROATE, CBMZ   .res Assessment: Plan:    Hypersomnolence disorder  Episodic mood disorder ([place])   Be careful with the Adderall dose DT hx hypomania from it.  Have had extensive discussions in the past about whether or not she truly bipolar.  She is done well off mood stabilizers the last several years.  Her hypomania in the past may have been more connected to medication induced than true bipolar.  No indication to change fluoxetine.  FU 4-6 mos  [name], MD, DFAPA   Please see After Visit Summary for patient specific instructions.  Future Appointments  Date Time Provider [place]  04/30/2018 10:15 AM [name], MD [facility] None  05/30/2018  2:00 PM [facility]    No orders of the defined types were placed in this encounter.     -------------------------------

## 2018-02-28 DIAGNOSIS — R490 Dysphonia: Secondary | ICD-10-CM | POA: Diagnosis not present

## 2018-02-28 DIAGNOSIS — K219 Gastro-esophageal reflux disease without esophagitis: Secondary | ICD-10-CM | POA: Diagnosis not present

## 2018-03-05 DIAGNOSIS — L718 Other rosacea: Secondary | ICD-10-CM | POA: Diagnosis not present

## 2018-03-05 DIAGNOSIS — L821 Other seborrheic keratosis: Secondary | ICD-10-CM | POA: Diagnosis not present

## 2018-03-20 ENCOUNTER — Ambulatory Visit: Payer: Medicare Other | Admitting: Pulmonary Disease

## 2018-03-26 NOTE — Telephone Encounter (Signed)
Patient is in need of Dextroamphetamine Sulfate 5mg  tab (3/day) to the [facility] at [place] message to VS today  VS please advise. Thank you.

## 2018-03-27 ENCOUNTER — Other Ambulatory Visit: Payer: Self-pay | Admitting: Pulmonary Disease

## 2018-03-27 MED ORDER — DEXTROAMPHETAMINE SULFATE 5 MG PO TABS: 15.0000 mg | ORAL_TABLET | Freq: Every day | ORAL | 0 refills | Status: DC

## 2018-03-27 NOTE — Progress Notes (Signed)
Refill for dextroamphetamine sent.

## 2018-04-30 ENCOUNTER — Ambulatory Visit: Payer: Medicare Other | Admitting: Pulmonary Disease

## 2018-04-30 ENCOUNTER — Encounter: Payer: Self-pay | Admitting: Family Medicine

## 2018-04-30 DIAGNOSIS — Z20820 Contact with and (suspected) exposure to varicella: Secondary | ICD-10-CM

## 2018-04-30 DIAGNOSIS — B0189 Other varicella complications: Secondary | ICD-10-CM

## 2018-04-30 NOTE — Telephone Encounter (Signed)
Received the following message from patient.   "Hi it's Brandy [name].I need an escript for 90 generic dextroamphetamine sulfate 5mg  tablets (3x per day).My current supply will run out in a few days.[facility]pharmacy is [facility] [phone].Many thanks! CB"  Last OV: 12/11/17 with Dr. Halford Chessman Last RX: 03/27/18 for 90 tablets Next OV: 08/10/18 with Dr. Halford Chessman.   [facility]red [facility] [address]  Dr. Halford Chessman, please advise if you are ok with this refill. Thanks!

## 2018-05-04 ENCOUNTER — Other Ambulatory Visit: Payer: Self-pay

## 2018-05-04 ENCOUNTER — Other Ambulatory Visit: Payer: Self-pay | Admitting: Nurse Practitioner

## 2018-05-04 ENCOUNTER — Encounter: Payer: Self-pay | Admitting: Adult Health

## 2018-05-04 ENCOUNTER — Other Ambulatory Visit: Payer: Self-pay | Admitting: Pharmacist

## 2018-05-04 ENCOUNTER — Ambulatory Visit (INDEPENDENT_AMBULATORY_CARE_PROVIDER_SITE_OTHER): Payer: Medicare Other | Admitting: Adult Health

## 2018-05-04 DIAGNOSIS — L247 Irritant contact dermatitis due to plants, except food: Secondary | ICD-10-CM | POA: Diagnosis not present

## 2018-05-04 MED ORDER — DEXTROAMPHETAMINE SULFATE 5 MG PO TABS: 15.0000 mg | ORAL_TABLET | Freq: Every day | ORAL | 0 refills | Status: DC

## 2018-05-04 MED ORDER — TRIAMCINOLONE ACETONIDE 0.1 % EX CREA: 1.0000 "application " | TOPICAL_CREAM | Freq: Two times a day (BID) | CUTANEOUS | 1 refills | Status: DC

## 2018-05-04 NOTE — Progress Notes (Signed)
Virtual Visit via Video Note  I connected with@ on 05/04/18 at 11:30 AM EDT by a video enabled telemedicine application and verified that I am speaking with the correct person using two identifiers.  Location patient: home Location provider:work or home office Persons participating in the virtual visit: patient, provider  I discussed the limitations of evaluation and management by telemedicine and the availability of in person appointments. The patient expressed understanding and agreed to proceed.   HPI: 68 year old female who is being evaluated today for contact dermatitis.  She reports that she was working in her yard about a week ago he came in contact with some type of plant that caused her to have a red itchy rash.  She reports that she has been applying counter cortisone cream on the rash and this seems to be helping but minimally.  Rash is located under her left eye and on her right forearm.  She denies any drainage or blisters.   ROS: See pertinent positives and negatives per HPI.  Past Medical History:  Diagnosis Date  . Arthritis   . Bipolar 1 disorder ([place])   . CAD (coronary artery disease) 2007   CABG X 1  . Depression    controlled  . Hay fever   . Headache    Hx of migraines  . Heart murmur   . Hyperlipidemia   . Hypertension   . Idiopathic hypersomnia   . Mitral valve prolapse   . PONV (postoperative nausea and vomiting)   . Urinary incontinence     Past Surgical History:  Procedure Laterality Date  . cataract surgery     BIL  . CORONARY ARTERY BYPASS GRAFT  03/21/05   off pump LIMA-LAD  . myomectomy  1981  . reconstructive surgery  1945   lip   . [place]   3 times  . SHOULDER ARTHROSCOPY  2006  . TOTAL ABDOMINAL HYSTERECTOMY  1991  . TOTAL HIP ARTHROPLASTY Left 03/01/2016   Procedure: LEFT TOTAL HIP ARTHROPLASTY ANTERIOR APPROACH;  Surgeon: Paralee Cancel, MD;  Location: [facility];  Service: Orthopedics;  Laterality: Left;  Requests 70  mins  . trach    . VESICOVAGINAL FISTULA CLOSURE W/ TAH      Family History  Problem Relation Age of Onset  . Stroke Mother        died 67  . Aneurysm Mother   . Hypertension Mother   . Kidney disease Mother   . Bipolar disorder Mother   . Lung cancer Father        died 62  . Bipolar disorder Other   . Hypertension Other   . Osteoporosis Maternal Grandmother   . Arthritis Maternal Grandmother   . Alcohol abuse Maternal Grandfather       Current Outpatient Medications:  .  Calcium Carb-Cholecalciferol (CALCIUM 500+D) 500-400 MG-UNIT TABS, Take 2 tablets by mouth daily., Disp: , Rfl:  .  Cholecalciferol (VITAMIN D3) 125 MCG (5000 UT) CAPS, Take 1 capsule by mouth daily., Disp: , Rfl:  .  clindamycin (CLEOCIN T) 1 % lotion, APPLY TO THE AFFECTED AREA 2 TIMES DAILY FOR 7 DAYS, Disp: , Rfl:  .  dextroamphetamine (DEXTROSTAT) 5 MG tablet, Take 3 tablets (15 mg total) by mouth daily., Disp: 90 tablet, Rfl: 0 .  famotidine (PEPCID) 20 MG tablet, Take 20 mg by mouth daily., Disp: , Rfl:  .  FLUoxetine (PROZAC) 20 MG tablet, Take 1 tablet (20 mg total) by mouth daily., Disp: 90  tablet, Rfl: 1 .  fluticasone (FLONASE) 50 MCG/ACT nasal spray, Place 2 sprays into both nostrils daily., Disp: 16 g, Rfl: 2 .  mometasone (ELOCON) 0.1 % cream, as needed., Disp: , Rfl:  .  Multiple Vitamins-Minerals (MULTIPLE VITAMINS/WOMENS) tablet, Take 1 tablet by mouth daily., Disp: , Rfl:  .  naproxen (NAPROSYN) 250 MG tablet, Take 250 mg by mouth 1 day or 1 dose., Disp: , Rfl:  .  valsartan (DIOVAN) 320 MG tablet, Take 1 tablet (320 mg total) by mouth daily., Disp: 90 tablet, Rfl: 1  Current Facility-Administered Medications:  .  ipratropium-albuterol (DUONEB) 0.5-2.5 (3) MG/3ML nebulizer solution 3 mL, 3 mL, Nebulization, Once, Angelene Rome, Tommi Rumps, NP  EXAM:  VITALS per patient if applicable:  GENERAL: alert, oriented, appears well and in no acute distress  HEENT: atraumatic, conjunttiva clear, no  obvious abnormalities on inspection of external nose and ears  NECK: normal movements of the head and neck  LUNGS: on inspection no signs of respiratory distress, breathing rate appears normal, no obvious gross SOB, gasping or wheezing  CV: no obvious cyanosis  MS: moves all visible extremities without noticeable abnormality  PSYCH/NEURO: pleasant and cooperative, no obvious depression or anxiety, speech and thought processing grossly intact  SKIN: Red raised rash under left eye and on right forearm.  Does not appear as poison ivy or poison oak.  Vesicles or drainage noted  ASSESSMENT AND PLAN:  1. Irritant contact dermatitis due to plants, except food - triamcinolone cream (KENALOG) 0.1 %; Apply 1 application topically 2 (two) times daily.  Dispense: 30 g; Refill: 1 - Return precautions reviewed  - Can use cool compresses and benadryl as well for symptom relief   Discussed the following assessment and plan:    I discussed the assessment and treatment plan with the patient. The patient was provided an opportunity to ask questions and all were answered. The patient agreed with the plan and demonstrated an understanding of the instructions.   The patient was advised to call back or seek an in-person evaluation if the symptoms worsen or if the condition fails to improve as anticipated.   Dorothyann Peng, NP

## 2018-05-04 NOTE — Telephone Encounter (Signed)
TN you will have to send it in because its a controlled medication.

## 2018-05-04 NOTE — Telephone Encounter (Signed)
Yes. Please refill. Thanks.

## 2018-05-08 ENCOUNTER — Other Ambulatory Visit: Payer: Self-pay | Admitting: Psychiatry

## 2018-05-08 ENCOUNTER — Other Ambulatory Visit: Payer: Self-pay

## 2018-05-08 MED ORDER — FLUOXETINE HCL 20 MG PO TABS: 20.0000 mg | ORAL_TABLET | Freq: Every day | ORAL | 1 refills | Status: DC

## 2018-05-09 ENCOUNTER — Other Ambulatory Visit: Payer: Self-pay

## 2018-05-10 ENCOUNTER — Other Ambulatory Visit: Payer: Self-pay

## 2018-05-10 MED ORDER — FLUOXETINE HCL 20 MG PO CAPS: 20.0000 mg | ORAL_CAPSULE | Freq: Every day | ORAL | 1 refills | Status: DC

## 2018-05-18 ENCOUNTER — Other Ambulatory Visit (INDEPENDENT_AMBULATORY_CARE_PROVIDER_SITE_OTHER): Payer: Medicare Other

## 2018-05-18 ENCOUNTER — Other Ambulatory Visit: Payer: Self-pay

## 2018-05-18 DIAGNOSIS — Z20820 Contact with and (suspected) exposure to varicella: Secondary | ICD-10-CM | POA: Diagnosis not present

## 2018-05-22 ENCOUNTER — Encounter: Payer: Self-pay | Admitting: Family Medicine

## 2018-05-22 ENCOUNTER — Telehealth: Payer: Self-pay | Admitting: Family Medicine

## 2018-05-22 DIAGNOSIS — G5602 Carpal tunnel syndrome, left upper limb: Secondary | ICD-10-CM | POA: Diagnosis not present

## 2018-05-22 DIAGNOSIS — M65341 Trigger finger, right ring finger: Secondary | ICD-10-CM | POA: Diagnosis not present

## 2018-05-22 DIAGNOSIS — G5601 Carpal tunnel syndrome, right upper limb: Secondary | ICD-10-CM | POA: Diagnosis not present

## 2018-05-22 LAB — VARICELLA ZOSTER ABS, IGG/IGM
Varicella IgM: <0.91 index (ref 0.00–0.90)
Varicella zoster IgG: <135 index — ABNORMAL LOW (ref >165)

## 2018-05-22 NOTE — Telephone Encounter (Signed)
Patient returning call. At time of call there was no documentation entered in chart. Once call was ended there was a CRM. Please advise.    Copied from [place]. Topic: Quick Communication - Lab Results (Clinic Use ONLY) >> May 22, 2018 12:09 PM Alverda Skeans, RN wrote: Called patient to inform them of non immune lab results. When patient returns call, triage nurse may disclose results. Varicella nonimmune.

## 2018-05-22 NOTE — Telephone Encounter (Signed)
Spoke with Mechele Claude regarding pt's question due to lab results; she states that the information has been sent to Dr Carita Pian assistant, and is awaiting provider recommendations; they will contact the pt.

## 2018-05-23 NOTE — Telephone Encounter (Signed)
Spoke with pt regarding her lab results, advised pt that I will call her back when Dr Volanda Napoleon looks at her message regard if she need to have shingrix immunization. Verbalized understanding.

## 2018-05-27 ENCOUNTER — Other Ambulatory Visit: Payer: Self-pay

## 2018-05-28 DIAGNOSIS — G5603 Carpal tunnel syndrome, bilateral upper limbs: Secondary | ICD-10-CM | POA: Diagnosis not present

## 2018-05-28 MED ORDER — VALSARTAN 320 MG PO TABS: 320.0000 mg | ORAL_TABLET | Freq: Every day | ORAL | 1 refills | Status: DC

## 2018-05-30 ENCOUNTER — Ambulatory Visit: Payer: Medicare Other

## 2018-06-05 DIAGNOSIS — M65341 Trigger finger, right ring finger: Secondary | ICD-10-CM | POA: Diagnosis not present

## 2018-06-05 DIAGNOSIS — G5602 Carpal tunnel syndrome, left upper limb: Secondary | ICD-10-CM | POA: Diagnosis not present

## 2018-06-08 NOTE — Telephone Encounter (Signed)
Patient sent mychart email requesting refill on dextroamphetamine sulfate 5mg  to take 3 tabs by mouth daily #90.  Routed to T. Nils Pyle, NP for review.  Patient has scheduled OV in July with Dr. Halford Chessman.    Primary Pulmonologist: Sood  Last office visit and with whom: 01/19/18 Sood  What do we see them for (pulmonary problems): Narcolepsy Last OV assessment/plan:  Narcolepsy w/o cataplexy. - will increase dose of dextroamphetamine to 15 mg daily - advised that she could use less medication on certain days as she felt appropriate  Depression. - she is followed by Dr. [name]   Patient Instructions  Dextroamphetamine 5 mg pill >> take 3 pills daily in the morning  Follow up in 4 months   Was appointment offered to patient (explain)?  No   Reason for call: Adderall refill request   Tonya please advise.  Last refill 05/04/18   #90 Thank you  dextroamphetamine (DEXTROSTAT) 5 MG tablet [[id number]]   Order Details  Dose: 15 mg Route: Oral Frequency: Daily  Indications of Use: Narcolepsy  Dispense Quantity: 90 tablet Refills: 0 Fills remaining: --        Sig: Take 3 tablets (15 mg total) by mouth daily.       Written Date: 05/04/18 Expiration Date: 10/31/18    Start Date: 05/04/18 End Date: --    Earliest Fill Date: 05/04/18          Ordering Provider: -- DEA #:  -- NPI:  --   Authorizing Provider: Fenton Foy, NP DEA #:  [id number] NPI:  [id number]   Ordering User:  Fenton Foy, NP

## 2018-06-08 NOTE — Telephone Encounter (Signed)
Cannot refill until June 3rd. Thanks.

## 2018-06-13 MED ORDER — DEXTROAMPHETAMINE SULFATE 5 MG PO TABS: 15.0000 mg | ORAL_TABLET | Freq: Every day | ORAL | 0 refills | Status: DC

## 2018-06-13 NOTE — Addendum Note (Signed)
Addended by: Martyn Ehrich on: 06/13/2018 05:09 PM   Modules accepted: Orders

## 2018-06-13 NOTE — Telephone Encounter (Signed)
All set!

## 2018-06-14 ENCOUNTER — Telehealth: Payer: Self-pay

## 2018-06-14 NOTE — Telephone Encounter (Signed)
Called pt LVM for pt to call office and schedule appointment for the required immunizations

## 2018-06-19 ENCOUNTER — Telehealth: Payer: Self-pay | Admitting: *Deleted

## 2018-06-19 NOTE — Telephone Encounter (Incomplete)
Attempted to contact patient do screening before appt tomorrow. No answer. LVM for patient to return call   Questions for Screening COVID-19  Symptom onset: ***  Travel or Contacts: ***  During this illness, did/does the patient experience any of the following symptoms? Fever >100.76F []   Yes []   No []   Unknown Subjective fever (felt feverish) []   Yes []   No []   Unknown Chills []   Yes []   No []   Unknown Muscle aches (myalgia) []   Yes []   No []   Unknown Runny nose (rhinorrhea) []   Yes []   No []   Unknown Sore throat []   Yes []   No []   Unknown Cough (new onset or worsening of chronic cough) []   Yes []   No []   Unknown Shortness of breath (dyspnea) []   Yes []   No []   Unknown Nausea or vomiting []   Yes []   No []   Unknown Headache []   Yes []   No []   Unknown Abdominal pain  []   Yes []   No []   Unknown Diarrhea (?3 loose/looser than normal stools/24hr period) []   Yes []   No []   Unknown Other, specify:  Patient risk factors: Smoker? []   Current []   Former []   Never If female, currently pregnant? []   Yes []   No  Patient Active Problem List   Diagnosis Date Noted  . Rosacea 08/24/2017  . 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 Hyperlipidemia   . Idiopathic hypersomnia   . CAD (coronary artery disease)   . Hypertension   . Bipolar 1 disorder ([place])   . BIPOLAR AFFECTIVE DISORDER 12/28/2006

## 2018-06-19 NOTE — Telephone Encounter (Signed)
Tried to return pt call but LVM for pt to call the offcie

## 2018-06-19 NOTE — Telephone Encounter (Signed)
Pt called back.  Tried office 3x.

## 2018-06-20 ENCOUNTER — Other Ambulatory Visit: Payer: Self-pay

## 2018-06-20 ENCOUNTER — Ambulatory Visit: Payer: Medicare Other

## 2018-06-20 DIAGNOSIS — R200 Anesthesia of skin: Secondary | ICD-10-CM | POA: Insufficient documentation

## 2018-06-20 DIAGNOSIS — M1990 Unspecified osteoarthritis, unspecified site: Secondary | ICD-10-CM | POA: Insufficient documentation

## 2018-06-20 DIAGNOSIS — R202 Paresthesia of skin: Secondary | ICD-10-CM | POA: Insufficient documentation

## 2018-06-20 DIAGNOSIS — R490 Dysphonia: Secondary | ICD-10-CM | POA: Insufficient documentation

## 2018-06-21 ENCOUNTER — Encounter: Payer: Self-pay | Admitting: Family Medicine

## 2018-06-21 ENCOUNTER — Telehealth: Payer: Self-pay | Admitting: Family Medicine

## 2018-06-21 NOTE — Telephone Encounter (Signed)
Pt would like to have a call back from Dr. Volanda Napoleon and her only as to why she is not able to get the vaccines Varicella and Tetanus.  She stated that she has been on the [employer] for Shingrix and she would like to see it Dr. Volanda Napoleon could give her a call back.

## 2018-06-21 NOTE — Telephone Encounter (Signed)
Spoke with pt advised pt that the Varicella vaccine and Tdap may not be covered by her insurance and out of pocket cost might be $195 or higher. Pt was advised to check with her insurance for coverage insists that she needs the 2 dose Varicella immunization and not Shingrix due to her Varicella Titer testing negative. Advise pt to also check with her pharmacy about administration cost.

## 2018-06-22 NOTE — Telephone Encounter (Signed)
Spoke with pt advised pt that the Varicella vaccine and Tdap may not be covered by her insurance and out of pocket cost might be $195 or higher. Pt was advised to check with her insurance for coverage insists that she needs the 2 dose Varicella immunization and not Shingrix due to her Varicella Titer testing negative. Advise pt to also check with her pharmacy about administration cost

## 2018-06-29 ENCOUNTER — Telehealth: Payer: Self-pay

## 2018-06-29 NOTE — Telephone Encounter (Signed)
Copied from [place] [id number]. Topic: Appointment Scheduling - Transfer of Care >> Jun 29, 2018  9:48 AM Richardo Priest, NT wrote: Pt is requesting to transfer FROM: Dr.Banks with [facility] Pt is requesting to transfer TO: Dr.Aron with [facility] Reason for requested transfer: States there was a disagreement with immunization care. Would like new PCP as friend is patient with Dr.Aron. Call back is [phone].   Send CRM to patient's current PCP (transferring FROM).

## 2018-06-29 NOTE — Telephone Encounter (Signed)
That's fine

## 2018-06-29 NOTE — Telephone Encounter (Signed)
Dr. Banks - Please advise on pt's request to transfer care. Thank you! 

## 2018-07-02 NOTE — Telephone Encounter (Signed)
Done

## 2018-07-02 NOTE — Telephone Encounter (Signed)
Dr. Deborra Medina - Please advise on pt's request to transfer care to you. Thanks!

## 2018-07-02 NOTE — Telephone Encounter (Signed)
Please schedule with Dr. Deborra Medina for Transfer of Care.

## 2018-07-02 NOTE — Telephone Encounter (Signed)
Yes okay to transfer care.

## 2018-07-02 NOTE — Telephone Encounter (Signed)
Pt requested to transfer her care to another provider

## 2018-07-04 ENCOUNTER — Telehealth: Payer: Self-pay | Admitting: *Deleted

## 2018-07-04 NOTE — Telephone Encounter (Signed)
RN Recieved message that patient called the [facility] travel line for advice. RN returned the call, left message with [place] vaccine information ([place] [phone]) and asked her to call back if she needed any further advice. Landis Gandy, RN

## 2018-07-05 ENCOUNTER — Telehealth: Payer: Medicare Other | Admitting: Family Medicine

## 2018-07-17 ENCOUNTER — Telehealth: Payer: Self-pay

## 2018-07-17 DIAGNOSIS — G47419 Narcolepsy without cataplexy: Secondary | ICD-10-CM | POA: Insufficient documentation

## 2018-07-17 NOTE — Telephone Encounter (Signed)
Questions for Screening COVID-19  Symptom onset: n/a  Travel or Contacts: no  During this illness, did/does the patient experience any of the following symptoms? Fever >100.18F []   Yes [x]   No []   Unknown Subjective fever (felt feverish) []   Yes [x]   No []   Unknown Chills []   Yes [x]   No []   Unknown Muscle aches (myalgia) []   Yes [x]   No []   Unknown Runny nose (rhinorrhea) []   Yes [x]   No []   Unknown Sore throat []   Yes [x]   No []   Unknown Cough (new onset or worsening of chronic cough) []   Yes [x]   No []   Unknown Shortness of breath (dyspnea) []   Yes [x]   No []   Unknown Nausea or vomiting []   Yes [x]   No []   Unknown Headache []   Yes [x]   No []   Unknown Abdominal pain  []   Yes [x]   No []   Unknown Diarrhea (?3 loose/looser than normal stools/24hr period) []   Yes [x]   No []   Unknown Other, specify:  Patient risk factors: Smoker? []   Current []   Former []   Never If female, currently pregnant? []   Yes []   No  Patient Active Problem List   Diagnosis Date Noted  . 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 Arthritis 06/20/2018  . Hoarse voice quality 06/20/2018  . Numbness and tingling in both hands 06/20/2018  . Rosacea 08/24/2017  . 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 Hyperlipidemia   . Idiopathic hypersomnia   . CAD (coronary artery disease)   . Hypertension   . Bipolar 1 disorder ([place])   . BIPOLAR AFFECTIVE DISORDER 12/28/2006    Plan:  []   High risk for COVID-19 with red flags go to ED (with CP, SOB, weak/lightheaded, or fever > 101.5). Call ahead.  []   High risk for COVID-19 but stable. Inform provider and coordinate time for [facility] visit.   []   No red flags but URI signs or symptoms okay for [facility] visit.

## 2018-07-17 NOTE — Progress Notes (Signed)
Subjective:   Patient ID: Brandy [name], female    DOB: May 03, 1950, 68 y.o.   MRN: [id number]  Brandy [name] is a pleasant 68 y.o. year old female who presents to clinic today with Establish Care (TOC pt has concerned about varicella zoster vaccine/ never has had chicken pox had titer done )  on 07/18/2018  HPI:  Transferring care from Dr. Volanda Napoleon.  Chart extensively reviewed.  She had neg varicella titers in May.  Asking why should receive shingrix.  Had medicare wellness done on 05/24/17. Does have GYN.  HTN Currently taking Valsartan 320 mg daily. followed by Cardiology, Dr. Claiborne Billings according to her chart, but I do not see any office visits in Epic.  MDD- followed by Dr. Clovis Pu.  Was last seen by him on 09/12/17.  Taking prozac 20 mg daily.  Narcolepsy without cataplexy- followed by Dr. Halford Chessman. She last saw him 01/29/18.  Note reviewed.     His assessment and plan was as follows:  - will increase dose of dextroamphetamine to 15 mg daily - advised that she could use less medication on certain days as she felt appropriate  She has another appointment scheduled for later this month with him.  Current Outpatient Medications on File Prior to Visit  Medication Sig Dispense Refill  . Calcium Carb-Cholecalciferol (CALCIUM 500+D) 500-400 MG-UNIT TABS Take 2 tablets by mouth daily.    . Cholecalciferol (VITAMIN D3) 125 MCG (5000 UT) CAPS Take 1 capsule by mouth daily.    Marland Kitchen dextroamphetamine (DEXTROSTAT) 5 MG tablet Take 3 tablets (15 mg total) by mouth daily. 90 tablet 0  . famotidine (PEPCID) 20 MG tablet Take 20 mg by mouth daily.    Marland Kitchen FLUoxetine (PROZAC) 20 MG capsule Take 1 capsule (20 mg total) by mouth daily. 90 capsule 1  . fluticasone (FLONASE) 50 MCG/ACT nasal spray Place 2 sprays into both nostrils daily. 16 g 2  . mometasone (ELOCON) 0.1 % cream as needed.    . Multiple Vitamins-Minerals (MULTIPLE VITAMINS/WOMENS) tablet Take 1 tablet by mouth daily.    . Naproxen  (NAPROSYN PO) naproxen    . valsartan (DIOVAN) 320 MG tablet Take 1 tablet (320 mg total) by mouth daily. 90 tablet 1  . naproxen (NAPROSYN) 250 MG tablet Take 250 mg by mouth 1 day or 1 dose.     Current Facility-Administered Medications on File Prior to Visit  Medication Dose Route Frequency Provider Last Rate Last Dose  . ipratropium-albuterol (DUONEB) 0.5-2.5 (3) MG/3ML nebulizer solution 3 mL  3 mL Nebulization Once Dorothyann Peng, NP        Allergies  Allergen Reactions  . Penicillins Anaphylaxis    Has patient had a PCN reaction causing immediate rash, facial/tongue/throat swelling, SOB or lightheadedness with hypotension: Yes Has patient had a PCN reaction causing severe rash involving mucus membranes or skin necrosis: No Has patient had a PCN reaction that required hospitalization Yes Has patient had a PCN reaction occurring within the last 10 years: No If all of the above answers are "NO", then may proceed with Cephalosporin use.  Marland Kitchen Pentazocine Other (See Comments)  . Bextra [Valdecoxib] Hives  . Codeine Nausea And Vomiting  . Lactose Intolerance (Gi) Diarrhea  . Meperidine Hcl Nausea And Vomiting  . Morphine Nausea And Vomiting  . Pentazocine Lactate     Muscle spasms     Past Medical History:  Diagnosis Date  . Arthritis   . Bipolar 1 disorder ([place])   . CAD (coronary  artery disease) 2007   CABG X 1  . Depression    controlled  . Hay fever   . Headache    Hx of migraines  . Heart murmur   . Hyperlipidemia   . Hypertension   . Idiopathic hypersomnia   . Mitral valve prolapse   . PONV (postoperative nausea and vomiting)   . Urinary incontinence     Past Surgical History:  Procedure Laterality Date  . cataract surgery     BIL  . CORONARY ARTERY BYPASS GRAFT  03/21/05   off pump LIMA-LAD  . myomectomy  1981  . reconstructive surgery  1945   lip   . [place]   3 times  . SHOULDER ARTHROSCOPY  2006  . TOTAL ABDOMINAL HYSTERECTOMY  1991   . TOTAL HIP ARTHROPLASTY Left 03/01/2016   Procedure: LEFT TOTAL HIP ARTHROPLASTY ANTERIOR APPROACH;  Surgeon: Paralee Cancel, MD;  Location: [facility];  Service: Orthopedics;  Laterality: Left;  Requests 70 mins  . trach    . VESICOVAGINAL FISTULA CLOSURE W/ TAH      Family History  Problem Relation Age of Onset  . Stroke Mother        died 37  . Aneurysm Mother   . Hypertension Mother   . Kidney disease Mother   . Bipolar disorder Mother   . Lung cancer Father        died 58  . Bipolar disorder Other   . Hypertension Other   . Osteoporosis Maternal Grandmother   . Arthritis Maternal Grandmother   . Alcohol abuse Maternal Grandfather     Social History   Socioeconomic History  . Marital status: Divorced    Spouse name: Not on file  . Number of children: Not on file  . Years of education: Not on file  . Highest education level: Not on file  Occupational History  . Occupation: retired  Scientific laboratory technician  . Financial resource strain: Not on file  . Food insecurity    Worry: Not on file    Inability: Not on file  . Transportation needs    Medical: Not on file    Non-medical: Not on file  Tobacco Use  . Smoking status: Never Smoker  . Smokeless tobacco: Never Used  Substance and Sexual Activity  . Alcohol use: No  . Drug use: No  . Sexual activity: Yes  Lifestyle  . Physical activity    Days per week: Not on file    Minutes per session: Not on file  . Stress: Not on file  Relationships  . Social Herbalist on phone: Not on file    Gets together: Not on file    Attends religious service: Not on file    Active member of club or organization: Not on file    Attends meetings of clubs or organizations: Not on file    Relationship status: Not on file  . Intimate partner violence    Fear of current or ex partner: Not on file    Emotionally abused: Not on file    Physically abused: Not on file    Forced sexual activity: Not on file  Other Topics Concern  . Not  on file  Social History Narrative  . Not on file   The PMH, PSH, Social History, Family History, Medications, and allergies have been reviewed in [facility], and have been updated if relevant.  Review of Systems     Objective:  BP 140/80   Pulse 80   Temp 98.5 F (36.9 C) (Oral)   Ht 5\' 4"  (1.626 m)   Wt 111 lb 6.4 oz (50.5 kg)   LMP 01/10/1989 (Approximate)   SpO2 98%   BMI 19.12 kg/m    Physical Exam  General:  Well-developed,well-nourished,in no acute distress; alert,appropriate and cooperative throughout examination Head:  normocephalic and atraumatic.   Eyes:  vision grossly intact, PERRL Ears:  R ear normal and L ear normal externally, TMs clear bilaterally Nose:  no external deformity.   Mouth:  good dentition.   Neck:  No deformities, masses, or tenderness noted. Lungs:  Normal respiratory effort, chest expands symmetrically. Lungs are clear to auscultation, no crackles or wheezes. Heart:  Normal rate and regular rhythm. S1 and S2 normal without gallop, murmur, click, rub or other extra sounds. Abdomen:  Bowel sounds positive,abdomen soft and non-tender without masses, organomegaly or hernias noted. Msk:  No deformity or scoliosis noted of thoracic or lumbar spine.   Extremities:  No clubbing, cyanosis, edema, or deformity noted with normal full range of motion of all joints.   Neurologic:  alert & oriented X3 and gait normal.   Skin:  Intact without suspicious lesions or rashes Cervical Nodes:  No lymphadenopathy noted Axillary Nodes:  No palpable lymphadenopathy Psych:  Cognition and judgment appear intact. Alert and cooperative with normal attention span and concentration. 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

## 2018-07-18 ENCOUNTER — Ambulatory Visit (INDEPENDENT_AMBULATORY_CARE_PROVIDER_SITE_OTHER): Payer: Medicare Other | Admitting: Family Medicine

## 2018-07-18 ENCOUNTER — Encounter: Payer: Self-pay | Admitting: Family Medicine

## 2018-07-18 VITALS — BP 140/80 | HR 80 | Temp 98.5°F | Ht 64.0 in | Wt 111.4 lb

## 2018-07-18 DIAGNOSIS — Z23 Encounter for immunization: Secondary | ICD-10-CM

## 2018-07-18 DIAGNOSIS — G47419 Narcolepsy without cataplexy: Secondary | ICD-10-CM

## 2018-07-18 DIAGNOSIS — I119 Hypertensive heart disease without heart failure: Secondary | ICD-10-CM

## 2018-07-18 DIAGNOSIS — I2510 Atherosclerotic heart disease of native coronary artery without angina pectoris: Secondary | ICD-10-CM

## 2018-07-18 DIAGNOSIS — I10 Essential (primary) hypertension: Secondary | ICD-10-CM

## 2018-07-18 DIAGNOSIS — E7849 Other hyperlipidemia: Secondary | ICD-10-CM | POA: Diagnosis not present

## 2018-07-18 DIAGNOSIS — I2583 Coronary atherosclerosis due to lipid rich plaque: Secondary | ICD-10-CM | POA: Diagnosis not present

## 2018-07-18 DIAGNOSIS — F319 Bipolar disorder, unspecified: Secondary | ICD-10-CM

## 2018-07-18 NOTE — Assessment & Plan Note (Signed)
Followed by Dr. Halford Chessman. He will be seeing her again later this month.

## 2018-07-18 NOTE — Patient Instructions (Addendum)
Great to meet you.  Please schedule a nurse visit for your second dose of varicella in about 28 days.  A month after your second dose, schedule a follow up visit with me- at that time, we will do labs including your varicella titers. You can also schedule a wellness visit with Glenard Haring, Therapist, sports.

## 2018-07-18 NOTE — Assessment & Plan Note (Signed)
>  40 minutes spent in face to face time with patient, >50% spent in counselling or coordination of care reviewing patient's chart and discussing her history and questions about shingrix.  I agree that shingrix does not vaccinate against primary varicella (chicken pox).  We agreed to give her the 2 dose series of the varicella vaccine, recheck her titers 4 weeks after her 2nd dose and then discuss scheduling shingrix for her. The patient indicates understanding of these issues and agrees with the plan.

## 2018-07-18 NOTE — Assessment & Plan Note (Signed)
Followed by Dr. Clovis Pu.  She feels she is doing well on current dose of prozac.

## 2018-07-18 NOTE — Assessment & Plan Note (Signed)
She will be due for labs and will schedule a wellness and follow up visit with me as outlined in AVS.

## 2018-07-18 NOTE — Assessment & Plan Note (Signed)
Normotensive on current dose of Valsartan. No changes made.

## 2018-07-20 MED ORDER — DEXTROAMPHETAMINE SULFATE 5 MG PO TABS: 15.0000 mg | ORAL_TABLET | Freq: Every day | ORAL | 0 refills | Status: DC

## 2018-07-20 NOTE — Telephone Encounter (Signed)
yes

## 2018-07-20 NOTE — Telephone Encounter (Signed)
Patient emailing for medication refill  "Please send an escript for a one-month supply of generic dextroamphetamine 5mg  tab/ 3tabs daily = 90 tabs.  This prescription was last filled on June 13, 2018 [facility] is [facility] [address] [place] [phone]"  Beth please advise

## 2018-07-24 ENCOUNTER — Ambulatory Visit: Payer: Medicare Other

## 2018-07-24 MED ORDER — DEXTROAMPHETAMINE SULFATE 5 MG PO TABS: 15.0000 mg | ORAL_TABLET | Freq: Every day | ORAL | 0 refills | Status: DC

## 2018-07-24 NOTE — Telephone Encounter (Signed)
Beth - can you send in this prescription? We do not have the clearance to send it in.  Thanks!

## 2018-07-24 NOTE — Addendum Note (Signed)
Addended by: Desmond Dike C on: 07/24/2018 11:49 AM   Modules accepted: Orders

## 2018-07-25 ENCOUNTER — Telehealth: Payer: Self-pay | Admitting: Behavioral Health

## 2018-07-25 ENCOUNTER — Ambulatory Visit (INDEPENDENT_AMBULATORY_CARE_PROVIDER_SITE_OTHER): Payer: Medicare Other

## 2018-07-25 DIAGNOSIS — Z23 Encounter for immunization: Secondary | ICD-10-CM | POA: Diagnosis not present

## 2018-07-25 NOTE — Progress Notes (Signed)
Pt came into the office to receive her tdap and her varicella vaccine. Tdap given in the left deltoid, and varicella given in the back of the left arm. Pt tolerated both vaccines well. Pt stayed in the exam room for an additional 10 minutes after her injections and had no signs/symptoms of a reaction. Pt given vis sheets for both vaccines.

## 2018-07-25 NOTE — Telephone Encounter (Signed)

## 2018-07-31 ENCOUNTER — Ambulatory Visit: Payer: Medicare Other

## 2018-08-10 ENCOUNTER — Encounter: Payer: Self-pay | Admitting: Pulmonary Disease

## 2018-08-10 ENCOUNTER — Other Ambulatory Visit: Payer: Self-pay

## 2018-08-10 ENCOUNTER — Ambulatory Visit (INDEPENDENT_AMBULATORY_CARE_PROVIDER_SITE_OTHER): Payer: Medicare Other | Admitting: Pulmonary Disease

## 2018-08-10 VITALS — BP 128/78 | HR 76 | Temp 98.2°F | Ht 60.0 in | Wt 109.2 lb

## 2018-08-10 DIAGNOSIS — G47419 Narcolepsy without cataplexy: Secondary | ICD-10-CM | POA: Diagnosis not present

## 2018-08-10 DIAGNOSIS — I2510 Atherosclerotic heart disease of native coronary artery without angina pectoris: Secondary | ICD-10-CM | POA: Diagnosis not present

## 2018-08-10 DIAGNOSIS — I2583 Coronary atherosclerosis due to lipid rich plaque: Secondary | ICD-10-CM

## 2018-08-10 NOTE — Progress Notes (Signed)
[facility]  Chief Complaint  Patient presents with  . Follow-up    She reports her detroamphetamine works most of the time however she has built up a tolerance and it is not as effective as it use to be.     Constitutional:  BP 128/78   Pulse 76   Temp 98.2 F (36.8 C) (Oral)   Ht 5' (1.524 m)   Wt 109 lb 3.2 oz (49.5 kg)   LMP 01/10/1989 (Approximate)   SpO2 97%   BMI 21.33 kg/m   Past Medical History:  OA, CAD, Bipolar with depression, Migraine HA, HLD, HTN, MVP  Brief Summary:  Brandy [name] is a 68 y.o. 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  Still has periods during the day in which she feels sleepy.  Her schedule usually allows her to take a nap when this happens.  Her mood has been good.  She feels like she is on a stable medication regimen at present.    Denies headache, chest pain, palpitations, or tremor.   Physical Exam:   Appearance - well kempt   ENMT - no sinus tenderness, no nasal discharge, no oral exudate  Neck - no masses, trachea midline, no thyromegaly, no elevation in JVP  Respiratory - normal appearance of chest wall, normal respiratory effort w/o accessory muscle use, no dullness on percussion, no wheezing or rales  CV - s1s2 regular rate and rhythm, no murmurs, no peripheral edema, radial pulses symmetric  GI - soft, non tender  Lymph - no adenopathy noted in neck and axillary areas  MSK - normal gait  Ext - no cyanosis, clubbing, or joint inflammation noted  Skin - no rashes, lesions, or ulcers  Neuro - normal strength, oriented x 3  Psych - normal mood and affect    Assessment/Plan:   Narcolepsy w/o cataplexy. - continue detroamphetamine 15 mg daily - she can [place] less medication on days that her schedule is more flexible  Depression. - she is followed by Dr. [name]   Patient Instructions  Follow up in 6 months   [name], MD [place] Pager:  [phone] 08/10/2018, 10:39 AM  Flow Sheet    Sleep tests:  PSG 04/30/07 >>AHI 0.5, PLMI 5.2  MSLT 04/30/07 >> mean sleep latency 4 min, 5/5 naps, 0/5 SOREM  Medications:   Allergies as of 08/10/2018      Reactions   Penicillins Anaphylaxis   Has patient had a PCN reaction causing immediate rash, facial/tongue/throat swelling, SOB or lightheadedness with hypotension: Yes Has patient had a PCN reaction causing severe rash involving mucus membranes or skin necrosis: No Has patient had a PCN reaction that required hospitalization Yes Has patient had a PCN reaction occurring within the last 10 years: No If all of the above answers are "NO", then may proceed with Cephalosporin use.   Pentazocine Other (See Comments)   Bextra [valdecoxib] Hives   Codeine Nausea And Vomiting   Lactose Intolerance (gi) Diarrhea   Meperidine Hcl Nausea And Vomiting   Morphine Nausea And Vomiting   Pentazocine Lactate    Muscle spasms       Medication List       Accurate as of August 10, 2018 10:39 AM. If you have any questions, ask your nurse or doctor.        Calcium 500+D 500-400 MG-UNIT Tabs Generic drug: Calcium Carb-Cholecalciferol Take 2 tablets by mouth daily.   dextroamphetamine 5 MG tablet Commonly known  as: DEXTROSTAT Take 3 tablets (15 mg total) by mouth daily.   famotidine 20 MG tablet Commonly known as: PEPCID Take 20 mg by mouth daily.   FLUoxetine 20 MG capsule Commonly known as: PROZAC Take 1 capsule (20 mg total) by mouth daily.   fluticasone 50 MCG/ACT nasal spray Commonly known as: FLONASE Place 2 sprays into both nostrils daily.   mometasone 0.1 % cream Commonly known as: ELOCON as needed.   Multiple Vitamins/Womens tablet Take 1 tablet by mouth daily.   naproxen 250 MG tablet Commonly known as: NAPROSYN Take 250 mg by mouth 1 day or 1 dose.   NAPROSYN PO naproxen   valsartan 320 MG tablet Commonly known as: DIOVAN Take 1 tablet (320 mg total) by  mouth daily.   Vitamin D3 125 MCG (5000 UT) Caps Take 1 capsule by mouth daily.       Past Surgical History:  She  has a past surgical history that includes trach; myomectomy (1981); Vesicovaginal fistula closure w/ TAH; Rotator cuff repair (1998); Coronary artery bypass graft (03/21/05); Shoulder arthroscopy (2006); Total abdominal hysterectomy (1991); cataract surgery; Total hip arthroplasty (Left, 03/01/2016); and reconstructive surgery (1945).  Family History:  Her family history includes Alcohol abuse in her maternal grandfather; Aneurysm in her mother; Arthritis in her maternal grandmother; Bipolar disorder in her mother and another family member; Hypertension in her mother and another family member; Kidney disease in her mother; Lung cancer in her father; Osteoporosis in her maternal grandmother; Stroke in her mother.  Social History:  She  reports that she has never smoked. She has never used smokeless tobacco. She reports that she does not drink alcohol or use drugs.

## 2018-08-10 NOTE — Patient Instructions (Signed)
Follow up in 6 months 

## 2018-08-13 ENCOUNTER — Encounter: Payer: Self-pay | Admitting: Family Medicine

## 2018-08-23 ENCOUNTER — Ambulatory Visit (INDEPENDENT_AMBULATORY_CARE_PROVIDER_SITE_OTHER): Payer: Medicare Other

## 2018-08-23 DIAGNOSIS — Z23 Encounter for immunization: Secondary | ICD-10-CM | POA: Diagnosis not present

## 2018-08-23 NOTE — Progress Notes (Signed)
After obtaining consent, and per orders of Dr. Deborra Medina, injection of Varicella given by Tanekia Ryans Berneta Sages. Patient instructed to remain in clinic for 20 minutes afterwards, and to report any adverse reaction to me immediately.

## 2018-08-27 NOTE — Telephone Encounter (Signed)
Dr. Halford Chessman please advise on refill request for Dextroamphetamine 5mg , #90 for 30 day supply. Last refill: 07/24/2018 Last OV: 08/10/2018 Next OV: recall for 01/2019 in system  Thanks!

## 2018-08-28 ENCOUNTER — Other Ambulatory Visit: Payer: Self-pay

## 2018-08-28 ENCOUNTER — Encounter: Payer: Self-pay | Admitting: Psychiatry

## 2018-08-28 ENCOUNTER — Ambulatory Visit (INDEPENDENT_AMBULATORY_CARE_PROVIDER_SITE_OTHER): Payer: Medicare Other | Admitting: Psychiatry

## 2018-08-28 VITALS — BP 144/74 | HR 72

## 2018-08-28 DIAGNOSIS — F39 Unspecified mood [affective] disorder: Secondary | ICD-10-CM

## 2018-08-28 DIAGNOSIS — I2583 Coronary atherosclerosis due to lipid rich plaque: Secondary | ICD-10-CM

## 2018-08-28 DIAGNOSIS — I2510 Atherosclerotic heart disease of native coronary artery without angina pectoris: Secondary | ICD-10-CM | POA: Diagnosis not present

## 2018-08-28 DIAGNOSIS — G4710 Hypersomnia, unspecified: Secondary | ICD-10-CM | POA: Diagnosis not present

## 2018-08-28 MED ORDER — FLUOXETINE HCL 20 MG PO CAPS: 20.0000 mg | ORAL_CAPSULE | Freq: Every day | ORAL | 3 refills | Status: DC

## 2018-08-28 NOTE — Progress Notes (Signed)
CAMEREN ODWYER [id number] 1950-01-20 68 y.o.  Subjective:   Patient ID:  Brandy [name] is a 69 y.o. (DOB [birth year]) female.  Chief Complaint:  Chief Complaint  Patient presents with  . Follow-up    Medication Management  . Other    Bipolar  . Medication Refill    Dextroamphetimine    Medication Refill Associated symptoms include arthralgias. Pertinent negatives include no weakness.   Brandy [name] presents to the office today for follow-up of mood and above.  Last seen February 2020.  Meds were not changed.  She remained on fluoxetine 20 mg daily.  Overall pretty good.  Managing the stress.  Nothing extraordinary.  CC is sleepiness.  Saw neurologist recently and no med change.  Has to nap.  Tolerance to the stimulant.  No mood swings. No depressive episodes.  Hyper somnolence a big problem and sleep doc rx retrial Adderall 15 BID.  Initially helped a lot and became somewhat tolerant.  But it still helps.  Didn't make her hyperverbal and hyperactive like in the past.  Has been very careful with the dosage.  Patient reports stable mood and denies depressed or irritable moods. No extremes of depression.   Patient denies any recent difficulty with anxiety.   Denies appetite disturbance.  Patient reports that energy and motivation have been better.  Patient denies any difficulty with concentration.  Patient denies any suicidal ideation. No history of cataplexy.  No new concerns.    Retired 2013.  Works on being active.   Past psych meds:  Fluoxetine,  Lamotrigine 600, Latuda, lithium, duloxetine, Abilify 5, Depakote 1000, Wellbutrin, Provigil, Celexa 40, Seroquel sedation, Cerefolin NAC, various stimulants, Trintellix, Trintellix, Viibryd 40, duloxetine, Restoril  Review of Systems:  Review of Systems  Musculoskeletal: Positive for arthralgias.  Neurological: Negative for tremors and weakness.  Psychiatric/Behavioral: Positive for sleep disturbance. Negative for  agitation, behavioral problems, confusion, decreased concentration, dysphoric mood, hallucinations, self-injury and suicidal ideas. The patient is not nervous/anxious and is not hyperactive.     Medications: I have reviewed the patient's current medications.  Current Outpatient Medications  Medication Sig Dispense Refill  . Cholecalciferol (VITAMIN D3) 125 MCG (5000 UT) CAPS Take 1 capsule by mouth daily.    Marland Kitchen dextroamphetamine (DEXTROSTAT) 5 MG tablet Take 3 tablets (15 mg total) by mouth daily. 90 tablet 0  . famotidine (PEPCID) 20 MG tablet Take 20 mg by mouth daily.    Marland Kitchen FLUoxetine (PROZAC) 20 MG capsule Take 1 capsule (20 mg total) by mouth daily. 90 capsule 1  . fluticasone (FLONASE) 50 MCG/ACT nasal spray Place 2 sprays into both nostrils daily. 16 g 2  . mometasone (ELOCON) 0.1 % cream as needed.    . Multiple Vitamins-Minerals (MULTIPLE VITAMINS/WOMENS) tablet Take 1 tablet by mouth daily.    . Naproxen (NAPROSYN PO) naproxen    . valsartan (DIOVAN) 320 MG tablet Take 1 tablet (320 mg total) by mouth daily. 90 tablet 1  . Calcium Carb-Cholecalciferol (CALCIUM 500+D) 500-400 MG-UNIT TABS Take 2 tablets by mouth daily.    . naproxen (NAPROSYN) 250 MG tablet Take 250 mg by mouth 1 day or 1 dose.     Current Facility-Administered Medications  Medication Dose Route Frequency Provider Last Rate Last Dose  . ipratropium-albuterol (DUONEB) 0.5-2.5 (3) MG/3ML nebulizer solution 3 mL  3 mL Nebulization Once Nafziger, Tommi Rumps, NP        Medication Side Effects: None  Allergies:  Allergies  Allergen Reactions  . Penicillins Anaphylaxis  Has patient had a PCN reaction causing immediate rash, facial/tongue/throat swelling, SOB or lightheadedness with hypotension: Yes Has patient had a PCN reaction causing severe rash involving mucus membranes or skin necrosis: No Has patient had a PCN reaction that required hospitalization Yes Has patient had a PCN reaction occurring within the last 10  years: No If all of the above answers are "NO", then may proceed with Cephalosporin use.  Marland Kitchen Pentazocine Other (See Comments)  . Bextra [Valdecoxib] Hives  . Codeine Nausea And Vomiting  . Lactose Intolerance (Gi) Diarrhea  . Meperidine Hcl Nausea And Vomiting  . Morphine Nausea And Vomiting  . Pentazocine Lactate     Muscle spasms     Past Medical History:  Diagnosis Date  . Arthritis   . Bipolar 1 disorder ([place])   . CAD (coronary artery disease) 2007   CABG X 1  . Depression    controlled  . Hay fever   . Headache    Hx of migraines  . Heart murmur   . Hyperlipidemia   . Hypertension   . Idiopathic hypersomnia   . Mitral valve prolapse   . PONV (postoperative nausea and vomiting)   . Urinary incontinence     Family History  Problem Relation Age of Onset  . Stroke Mother        died 30  . Aneurysm Mother   . Hypertension Mother   . Kidney disease Mother   . Bipolar disorder Mother   . Lung cancer Father        died 89  . Bipolar disorder Other   . Hypertension Other   . Osteoporosis Maternal Grandmother   . Arthritis Maternal Grandmother   . Alcohol abuse Maternal Grandfather     Social History   Socioeconomic History  . Marital status: Divorced    Spouse name: Not on file  . Number of children: Not on file  . Years of education: Not on file  . Highest education level: Not on file  Occupational History  . Occupation: retired  Scientific laboratory technician  . Financial resource strain: Not on file  . Food insecurity    Worry: Not on file    Inability: Not on file  . Transportation needs    Medical: Not on file    Non-medical: Not on file  Tobacco Use  . Smoking status: Never Smoker  . Smokeless tobacco: Never Used  Substance and Sexual Activity  . Alcohol use: No  . Drug use: No  . Sexual activity: Yes  Lifestyle  . Physical activity    Days per week: Not on file    Minutes per session: Not on file  . Stress: Not on file  Relationships  . Social  Herbalist on phone: Not on file    Gets together: Not on file    Attends religious service: Not on file    Active member of club or organization: Not on file    Attends meetings of clubs or organizations: Not on file    Relationship status: Not on file  . Intimate partner violence    Fear of current or ex partner: Not on file    Emotionally abused: Not on file    Physically abused: Not on file    Forced sexual activity: Not on file  Other Topics Concern  . Not on file  Social History Narrative  . Not on file    Past Medical History, Surgical history, Social history, and Family  history were reviewed and updated as appropriate.   Please see review of systems for further details on the patient's review from today.   Objective:   Physical Exam:  BP (!) 144/74   Pulse 72   LMP 01/10/1989 (Approximate)   Physical Exam Constitutional:      General: She is not in acute distress.    Appearance: She is well-developed.  Musculoskeletal:        General: No deformity.  Neurological:     Mental Status: She is alert and oriented to person, place, and time.     Motor: No tremor.     Coordination: Coordination normal.     Gait: Gait normal.  Psychiatric:        Attention and Perception: Attention normal. She is attentive.        Mood and Affect: Mood normal. Mood is not anxious or depressed. Affect is not labile, blunt, angry or inappropriate.        Speech: Speech normal.        Behavior: Behavior normal.        Thought Content: Thought content normal. Thought content does not include homicidal or suicidal ideation. Thought content does not include homicidal or suicidal plan.        Cognition and Memory: Cognition normal.        Judgment: Judgment normal.     Comments: Insight is good. No hypomania.       Lab Review:     Component Value Date/Time   NA 143 09/20/2016 1644   K 4.2 09/20/2016 1644   CL 102 09/20/2016 1644   CO2 28 09/20/2016 1644   GLUCOSE 80  09/20/2016 1644   BUN 29 (H) 09/20/2016 1644   CREATININE 0.73 09/20/2016 1644   CREATININE 0.70 08/11/2015 1155   CALCIUM 10.1 09/20/2016 1644   PROT 6.8 09/20/2016 1644   ALBUMIN 4.6 09/20/2016 1644   AST 20 09/20/2016 1644   ALT 22 09/20/2016 1644   ALKPHOS 104 09/20/2016 1644   BILITOT 0.5 09/20/2016 1644   GFRNONAA >60 03/02/2016 0455   GFRAA >60 03/02/2016 0455       Component Value Date/Time   WBC 8.9 09/20/2016 1644   RBC 4.71 09/20/2016 1644   HGB 14.3 09/20/2016 1644   HCT 43.5 09/20/2016 1644   PLT 319.0 09/20/2016 1644   MCV 92.3 09/20/2016 1644   MCH 28.2 03/02/2016 0455   MCHC 33.0 09/20/2016 1644   RDW 13.8 09/20/2016 1644   LYMPHSABS 1.9 09/20/2016 1644   MONOABS 0.5 09/20/2016 1644   EOSABS 0.0 09/20/2016 1644   BASOSABS 0.1 09/20/2016 1644    No results found for: POCLITH, LITHIUM   No results found for: PHENYTOIN, PHENOBARB, VALPROATE, CBMZ   .res Assessment: Plan:    Nija was seen today for follow-up, other and medication refill.  Diagnoses and all orders for this visit:  Episodic mood disorder ([place])  Hypersomnolence disorder   Be careful with the Adderall dose DT hx hypomania from it.  Disc the new med for hypersomnolence.  Value incorporating napping into the day to deal with hypersomnolence.    No hypomania since here.  Have had extensive discussions in the past about whether or not she truly bipolar.  She is done well off mood stabilizers the last several years.  Her hypomania in the past may have been more connected to medication induced than true bipolar.  The longer she goes without hypomania the less likely she is truly bipolar.  She  thinks prior stressful environment and lack of adequate sleep with higher dose stimulant made her look bipolar.   Reminded her about the signs and symptoms of mania and hypomania.  She is aware.  No indication to change fluoxetine.  Disc disagreement with the PCP and her reasons to change it. Over  issues of vaccines.  This appt was 30 mins.  FU 6-9 mos  [name], MD, DFAPA   Please see After Visit Summary for patient specific instructions.  No future appointments.  No orders of the defined types were placed in this encounter.     -------------------------------

## 2018-08-29 MED ORDER — DEXTROAMPHETAMINE SULFATE 5 MG PO TABS: 15.0000 mg | ORAL_TABLET | Freq: Every day | ORAL | 0 refills | Status: DC

## 2018-09-04 NOTE — Telephone Encounter (Signed)
VS can we refill Dexostrat? Please advise.

## 2018-09-25 ENCOUNTER — Ambulatory Visit: Payer: Medicare Other | Admitting: Family Medicine

## 2018-09-25 DIAGNOSIS — Z124 Encounter for screening for malignant neoplasm of cervix: Secondary | ICD-10-CM | POA: Diagnosis not present

## 2018-09-25 DIAGNOSIS — Z1231 Encounter for screening mammogram for malignant neoplasm of breast: Secondary | ICD-10-CM | POA: Diagnosis not present

## 2018-09-25 DIAGNOSIS — Z6821 Body mass index (BMI) 21.0-21.9, adult: Secondary | ICD-10-CM | POA: Diagnosis not present

## 2018-09-26 ENCOUNTER — Other Ambulatory Visit: Payer: Self-pay

## 2018-09-26 ENCOUNTER — Other Ambulatory Visit: Payer: Self-pay | Admitting: Family Medicine

## 2018-09-26 ENCOUNTER — Other Ambulatory Visit (INDEPENDENT_AMBULATORY_CARE_PROVIDER_SITE_OTHER): Payer: Medicare Other

## 2018-09-26 ENCOUNTER — Ambulatory Visit: Payer: Medicare Other | Admitting: Family Medicine

## 2018-09-26 DIAGNOSIS — Z23 Encounter for immunization: Secondary | ICD-10-CM

## 2018-09-26 DIAGNOSIS — Z0184 Encounter for antibody response examination: Secondary | ICD-10-CM

## 2018-09-26 NOTE — Progress Notes (Signed)
v

## 2018-09-28 LAB — VARICELLA ZOSTER ABS, IGG/IGM
Varicella IgM: <0.91 index (ref 0.00–0.90)
Varicella zoster IgG: 476 index (ref >165)

## 2018-10-08 ENCOUNTER — Encounter: Payer: Self-pay | Admitting: Family Medicine

## 2018-10-08 ENCOUNTER — Other Ambulatory Visit: Payer: Self-pay

## 2018-10-08 NOTE — Telephone Encounter (Signed)
Received emailed refill request from the patient.  Medication name/strength/dose: Dextroamphetamine 5mg  TID Medication last rx'd: 8.19.2020 Quantity and number of refills last rx'd: #90 with 0 refills  Patient last seen in the office on 7.31.2020 with Dr. Halford Chessman.  Due for follow up in January 2021.  Does refill need to be authorized by a provider? yes   Routing to Dr Halford Chessman to authorize/deny the refill.

## 2018-10-09 MED ORDER — DEXTROAMPHETAMINE SULFATE 5 MG PO TABS: 15.0000 mg | ORAL_TABLET | Freq: Every day | ORAL | 0 refills | Status: DC

## 2018-10-09 NOTE — Telephone Encounter (Signed)
Dr. Sood - please advise on refill. Thanks. 

## 2018-10-11 ENCOUNTER — Encounter: Payer: Self-pay | Admitting: Family Medicine

## 2018-10-11 DIAGNOSIS — N6489 Other specified disorders of breast: Secondary | ICD-10-CM

## 2018-10-15 ENCOUNTER — Other Ambulatory Visit: Payer: Self-pay

## 2018-10-23 NOTE — Progress Notes (Signed)
Virtual Visit via Video Note  I connected with patient on 10/24/18 at 11:00 AM EDT by audio enabled telemedicine application and verified that I am speaking with the correct person using two identifiers.   THIS ENCOUNTER IS A VIRTUAL VISIT DUE TO COVID-19 - PATIENT WAS NOT SEEN IN THE OFFICE. PATIENT HAS CONSENTED TO VIRTUAL VISIT / TELEMEDICINE VISIT   Location of patient: home  Location of provider: office  I discussed the limitations of evaluation and management by telemedicine and the availability of in person appointments. The patient expressed understanding and agreed to proceed.   Subjective:   Brandy [name] is a 68 y.o. female who presents for Medicare Annual (Subsequent) preventive examination.  Pt states she is still very active. [employer], [employer]ows, Social research officer, government.   Review of Systems:   Home Safety/Smoke Alarms: Feels safe in home. Smoke alarms in place.  Lives alone w/ dog in 1 story home.  Female:   Mammo- done recently at [facility] per pt.       Dexa scan- 09/13/17       CCS-pt reports last done in 2017-normal     Objective:     Vitals: BP 127/73 Comment: pt reported  Ht 5' (1.524 m)   Wt 107 lb 6.4 oz (48.7 kg)   LMP 01/10/1989 (Approximate)   BMI 20.98 kg/m   Body mass index is 20.98 kg/m.  Advanced Directives 10/24/2018 05/24/2017 03/01/2016 03/01/2016 02/23/2016  Does Patient Have a Medical Advance Directive? No Yes Yes Yes Yes  Type of Advance Directive - - [place];Living will [place];Living will Living will  Does patient want to make changes to medical advance directive? - - No - Patient declined - -  Copy of [place] in Chart? - - Yes Yes -  Would patient like information on creating a medical advance directive? No - Patient declined - - - -    Tobacco Social History   Tobacco Use  Smoking Status Never Smoker  Smokeless Tobacco Never Used     Counseling given: Not Answered    Clinical Intake: Pain : No/denies pain     Past Medical History:  Diagnosis Date  . Arthritis   . Bipolar 1 disorder ([place])   . CAD (coronary artery disease) 2007   CABG X 1  . Depression    controlled  . Hay fever   . Headache    Hx of migraines  . Heart murmur   . Hyperlipidemia   . Hypertension   . Idiopathic hypersomnia   . Mitral valve prolapse   . PONV (postoperative nausea and vomiting)   . Urinary incontinence    Past Surgical History:  Procedure Laterality Date  . cataract surgery     BIL  . CORONARY ARTERY BYPASS GRAFT  03/21/05   off pump LIMA-LAD  . myomectomy  1981  . reconstructive surgery  1945   lip   . [place]   3 times  . SHOULDER ARTHROSCOPY  2006  . TOTAL ABDOMINAL HYSTERECTOMY  1991  . TOTAL HIP ARTHROPLASTY Left 03/01/2016   Procedure: LEFT TOTAL HIP ARTHROPLASTY ANTERIOR APPROACH;  Surgeon: Paralee Cancel, MD;  Location: [facility];  Service: Orthopedics;  Laterality: Left;  Requests 70 mins  . trach    . VESICOVAGINAL FISTULA CLOSURE W/ TAH     Family History  Problem Relation Age of Onset  . Stroke Mother  died 25  . Aneurysm Mother   . Hypertension Mother   . Kidney disease Mother   . Bipolar disorder Mother   . Lung cancer Father        died 38  . Bipolar disorder Other   . Hypertension Other   . Osteoporosis Maternal Grandmother   . Arthritis Maternal Grandmother   . Alcohol abuse Maternal Grandfather    Social History   Socioeconomic History  . Marital status: Divorced    Spouse name: Not on file  . Number of children: Not on file  . Years of education: Not on file  . Highest education level: Not on file  Occupational History  . Occupation: retired  Scientific laboratory technician  . Financial resource strain: Not on file  . Food insecurity    Worry: Not on file    Inability: Not on file  . Transportation needs    Medical: Not on file    Non-medical: Not on file  Tobacco Use  . Smoking status: Never Smoker  .  Smokeless tobacco: Never Used  Substance and Sexual Activity  . Alcohol use: No  . Drug use: No  . Sexual activity: Yes  Lifestyle  . Physical activity    Days per week: Not on file    Minutes per session: Not on file  . Stress: Not on file  Relationships  . Social Herbalist on phone: Not on file    Gets together: Not on file    Attends religious service: Not on file    Active member of club or organization: Not on file    Attends meetings of clubs or organizations: Not on file    Relationship status: Not on file  Other Topics Concern  . Not on file  Social History Narrative  . Not on file    Outpatient Encounter Medications as of 10/24/2018  Medication Sig  . Calcium Carb-Cholecalciferol (CALCIUM 500+D) 500-400 MG-UNIT TABS Take 2 tablets by mouth daily.  . Cholecalciferol (VITAMIN D3) 125 MCG (5000 UT) CAPS Take 1 capsule by mouth daily.  Marland Kitchen dextroamphetamine (DEXTROSTAT) 5 MG tablet Take 3 tablets (15 mg total) by mouth daily.  . famotidine (PEPCID) 20 MG tablet Take 20 mg by mouth daily.  Marland Kitchen FLUoxetine (PROZAC) 20 MG capsule Take 1 capsule (20 mg total) by mouth daily.  . fluticasone (FLONASE) 50 MCG/ACT nasal spray Place 2 sprays into both nostrils daily.  . mometasone (ELOCON) 0.1 % cream as needed.  . Multiple Vitamins-Minerals (MULTIPLE VITAMINS/WOMENS) tablet Take 1 tablet by mouth daily.  . Naproxen (NAPROSYN PO) naproxen  . valsartan (DIOVAN) 320 MG tablet Take 1 tablet (320 mg total) by mouth daily.   Facility-Administered Encounter Medications as of 10/24/2018  Medication  . ipratropium-albuterol (DUONEB) 0.5-2.5 (3) MG/3ML nebulizer solution 3 mL    Activities of Daily Living In your present state of health, do you have any difficulty performing the following activities: 08/23/2018  Hearing? N  Vision? N  Difficulty concentrating or making decisions? N  Walking or climbing stairs? N  Dressing or bathing? N  Doing errands, shopping? N  Some  recent data might be hidden    Patient Care Team: Lucille Passy, MD as PCP - General (Family Medicine)    Assessment:   This is a routine wellness examination for [place]. Physical assessment deferred to PCP.  Exercise Activities and Dietary recommendations   Diet (meal preparation, eat out, water intake, caffeinated beverages, dairy products, fruits and vegetables):  in general, a "healthy" diet  , well balanced   Goals    . Exercise 150 min/wk Moderate Activity     Some kind of regular exercise Zumba or other - exercise of choice  Attempt 3 days or more as tolerated        Fall Risk Fall Risk  10/24/2018 08/23/2018 05/24/2017 09/20/2016  Falls in the past year? 0 0 No No  Number falls in past yr: 0 - - -  Injury with Fall? 0 - - -  Follow up - Falls evaluation completed - -    Depression Screen PHQ 2/9 Scores 10/24/2018 05/24/2017 09/20/2016  PHQ - 2 Score 0 0 0     Cognitive Function Ad8 score reviewed for issues:  Issues making decisions:no  Less interest in hobbies / activities:no  Repeats questions, stories (family complaining):no  Trouble using ordinary gadgets (microwave, computer, phone):no  Forgets the month or year: no  Mismanaging finances: no  Remembering appts:no  Daily problems with thinking and/or memory:no Ad8 score is=0       Immunization History  Administered Date(s) Administered  . Fluad Quad(high Dose 65+) 09/26/2018  . Influenza Split 10/08/2016  . Influenza Whole 10/10/2009  . Influenza, High Dose Seasonal PF 11/09/2017  . Influenza-Unspecified 11/11/2010  . Pneumococcal Conjugate-13 11/09/2017  . Tdap 07/25/2018  . Varicella 07/25/2018, 08/23/2018    Screening Tests Health Maintenance  Topic Date Due  . MAMMOGRAM  05/04/2017  . PNA vac Low Risk Adult (2 of 2 - PPSV23) 11/10/2018  . COLONOSCOPY  12/10/2025  . TETANUS/TDAP  07/24/2028  . INFLUENZA VACCINE  Completed  . DEXA SCAN  Completed  . Hepatitis C Screening   Completed        Plan:    Please schedule your next medicare wellness visit with me in 1 yr.  Continue to eat heart healthy diet (full of fruits, vegetables, whole grains, lean protein, water--limit salt, fat, and sugar intake) and increase physical activity as tolerated.  Continue doing brain stimulating activities (puzzles, reading, adult coloring books, staying active) to keep memory sharp.     I have personally reviewed and noted the following in the patient's chart:   . Medical and social history . Use of alcohol, tobacco or illicit drugs  . Current medications and supplements . Functional ability and status . Nutritional status . Physical activity . Advanced directives . List of other physicians . Hospitalizations, surgeries, and ER visits in previous 12 months . Vitals . Screenings to include cognitive, depression, and falls . Referrals and appointments  In addition, I have reviewed and discussed with patient certain preventive protocols, quality metrics, and best practice recommendations. A written personalized care plan for preventive services as well as general preventive health recommendations were provided to patient.     Shela Nevin, [place]  10/24/2018

## 2018-10-24 ENCOUNTER — Ambulatory Visit (INDEPENDENT_AMBULATORY_CARE_PROVIDER_SITE_OTHER): Payer: Medicare Other | Admitting: *Deleted

## 2018-10-24 ENCOUNTER — Encounter: Payer: Self-pay | Admitting: *Deleted

## 2018-10-24 VITALS — BP 127/73 | Ht 60.0 in | Wt 107.4 lb

## 2018-10-24 DIAGNOSIS — Z0000 Encounter for general adult medical examination without abnormal findings: Secondary | ICD-10-CM

## 2018-10-24 NOTE — Patient Instructions (Signed)
Please schedule your next medicare wellness visit with me in 1 yr.  Continue to eat heart healthy diet (full of fruits, vegetables, whole grains, lean protein, water--limit salt, fat, and sugar intake) and increase physical activity as tolerated.  Continue doing brain stimulating activities (puzzles, reading, adult coloring books, staying active) to keep memory sharp.    Brandy [name] , Thank you for taking time to come for your Medicare Wellness Visit. I appreciate your ongoing commitment to your health goals. Please review the following plan we discussed and let me know if I can assist you in the future.   These are the goals we discussed: Goals     Exercise 150 min/wk Moderate Activity     Some kind of regular exercise Zumba or other - exercise of choice  Attempt 3 days or more as tolerated        This is a list of the screening recommended for you and due dates:  Health Maintenance  Topic Date Due   Mammogram  05/04/2017   Pneumonia vaccines (2 of 2 - PPSV23) 11/10/2018   Colon Cancer Screening  12/10/2025   Tetanus Vaccine  07/24/2028   Flu Shot  Completed   DEXA scan (bone density measurement)  Completed    Hepatitis C: One time screening is recommended by Center for Disease Control  (CDC) for  adults born from [birth year] through 1967.   Completed    Health Maintenance After Age 58 After age 3, you are at a higher risk for certain long-term diseases and infections as well as injuries from falls. Falls are a major cause of broken bones and head injuries in people who are older than age 90. Getting regular preventive care can help to keep you healthy and well. Preventive care includes getting regular testing and making lifestyle changes as recommended by your health care provider. Talk with your health care provider about:  Which screenings and tests you should have. A screening is a test that checks for a disease when you have no symptoms.  A diet and exercise plan that  is right for you. What should I know about screenings and tests to prevent falls? Screening and testing are the best ways to find a health problem early. Early diagnosis and treatment give you the best chance of managing medical conditions that are common after age 54. Certain conditions and lifestyle choices may make you more likely to have a fall. Your health care provider may recommend:  Regular vision checks. Poor vision and conditions such as cataracts can make you more likely to have a fall. If you wear glasses, make sure to get your prescription updated if your vision changes.  Medicine review. Work with your health care provider to regularly review all of the medicines you are taking, including over-the-counter medicines. Ask your health care provider about any side effects that may make you more likely to have a fall. Tell your health care provider if any medicines that you take make you feel dizzy or sleepy.  Osteoporosis screening. Osteoporosis is a condition that causes the bones to get weaker. This can make the bones weak and cause them to break more easily.  Blood pressure screening. Blood pressure changes and medicines to control blood pressure can make you feel dizzy.  Strength and balance checks. Your health care provider may recommend certain tests to check your strength and balance while standing, walking, or changing positions.  Foot health exam. Foot pain and numbness, as well as not wearing  proper footwear, can make you more likely to have a fall.  Depression screening. You may be more likely to have a fall if you have a fear of falling, feel emotionally low, or feel unable to do activities that you used to do.  Alcohol use screening. Using too much alcohol can affect your balance and may make you more likely to have a fall. What actions can I take to lower my risk of falls? General instructions  Talk with your health care provider about your risks for falling. Tell your  health care provider if: ? You fall. Be sure to tell your health care provider about all falls, even ones that seem minor. ? You feel dizzy, sleepy, or off-balance.  Take over-the-counter and prescription medicines only as told by your health care provider. These include any supplements.  Eat a healthy diet and maintain a healthy weight. A healthy diet includes low-fat dairy products, low-fat (lean) meats, and fiber from whole grains, beans, and lots of fruits and vegetables. Home safety  Remove any tripping hazards, such as rugs, cords, and clutter.  Install safety equipment such as grab bars in bathrooms and safety rails on stairs.  Keep rooms and walkways well-lit. Activity   Follow a regular exercise program to stay fit. This will help you maintain your balance. Ask your health care provider what types of exercise are appropriate for you.  If you need a cane or walker, use it as recommended by your health care provider.  Wear supportive shoes that have nonskid soles. Lifestyle  Do not drink alcohol if your health care provider tells you not to drink.  If you drink alcohol, limit how much you have: ? 0-1 drink a day for women. ? 0-2 drinks a day for men.  Be aware of how much alcohol is in your drink. In the [place] one drink equals one typical bottle of beer (12 oz), one-half glass of wine (5 oz), or one shot of hard liquor (1 oz).  Do not use any products that contain nicotine or tobacco, such as cigarettes and e-cigarettes. If you need help quitting, ask your health care provider. Summary  Having a healthy lifestyle and getting preventive care can help to protect your health and wellness after age 15.  Screening and testing are the best way to find a health problem early and help you avoid having a fall. Early diagnosis and treatment give you the best chance for managing medical conditions that are more common for people who are older than age 33.  Falls are a major cause  of broken bones and head injuries in people who are older than age 37. Take precautions to prevent a fall at home.  Work with your health care provider to learn what changes you can make to improve your health and wellness and to prevent falls. This information is not intended to replace advice given to you by your health care provider. Make sure you discuss any questions you have with your health care provider. Document Released: 11/09/2016 Document Revised: 04/19/2018 Document Reviewed: 11/09/2016 Elsevier Patient Education  2020 [place] Maintenance After Age 68 After age 84, you are at a higher risk for certain long-term diseases and infections as well as injuries from falls. Falls are a major cause of broken bones and head injuries in people who are older than age 24. Getting regular preventive care can help to keep you healthy and well. Preventive care includes getting regular testing and making lifestyle  changes as recommended by your health care provider. Talk with your health care provider about:  Which screenings and tests you should have. A screening is a test that checks for a disease when you have no symptoms.  A diet and exercise plan that is right for you. What should I know about screenings and tests to prevent falls? Screening and testing are the best ways to find a health problem early. Early diagnosis and treatment give you the best chance of managing medical conditions that are common after age 68. Certain conditions and lifestyle choices may make you more likely to have a fall. Your health care provider may recommend:  Regular vision checks. Poor vision and conditions such as cataracts can make you more likely to have a fall. If you wear glasses, make sure to get your prescription updated if your vision changes.  Medicine review. Work with your health care provider to regularly review all of the medicines you are taking, including over-the-counter medicines. Ask  your health care provider about any side effects that may make you more likely to have a fall. Tell your health care provider if any medicines that you take make you feel dizzy or sleepy.  Osteoporosis screening. Osteoporosis is a condition that causes the bones to get weaker. This can make the bones weak and cause them to break more easily.  Blood pressure screening. Blood pressure changes and medicines to control blood pressure can make you feel dizzy.  Strength and balance checks. Your health care provider may recommend certain tests to check your strength and balance while standing, walking, or changing positions.  Foot health exam. Foot pain and numbness, as well as not wearing proper footwear, can make you more likely to have a fall.  Depression screening. You may be more likely to have a fall if you have a fear of falling, feel emotionally low, or feel unable to do activities that you used to do.  Alcohol use screening. Using too much alcohol can affect your balance and may make you more likely to have a fall. What actions can I take to lower my risk of falls? General instructions  Talk with your health care provider about your risks for falling. Tell your health care provider if: ? You fall. Be sure to tell your health care provider about all falls, even ones that seem minor. ? You feel dizzy, sleepy, or off-balance.  Take over-the-counter and prescription medicines only as told by your health care provider. These include any supplements.  Eat a healthy diet and maintain a healthy weight. A healthy diet includes low-fat dairy products, low-fat (lean) meats, and fiber from whole grains, beans, and lots of fruits and vegetables. Home safety  Remove any tripping hazards, such as rugs, cords, and clutter.  Install safety equipment such as grab bars in bathrooms and safety rails on stairs.  Keep rooms and walkways well-lit. Activity   Follow a regular exercise program to stay fit.  This will help you maintain your balance. Ask your health care provider what types of exercise are appropriate for you.  If you need a cane or walker, use it as recommended by your health care provider.  Wear supportive shoes that have nonskid soles. Lifestyle  Do not drink alcohol if your health care provider tells you not to drink.  If you drink alcohol, limit how much you have: ? 0-1 drink a day for women. ? 0-2 drinks a day for men.  Be aware of how much alcohol  is in your drink. In the [place] one drink equals one typical bottle of beer (12 oz), one-half glass of wine (5 oz), or one shot of hard liquor (1 oz).  Do not use any products that contain nicotine or tobacco, such as cigarettes and e-cigarettes. If you need help quitting, ask your health care provider. Summary  Having a healthy lifestyle and getting preventive care can help to protect your health and wellness after age 54.  Screening and testing are the best way to find a health problem early and help you avoid having a fall. Early diagnosis and treatment give you the best chance for managing medical conditions that are more common for people who are older than age 59.  Falls are a major cause of broken bones and head injuries in people who are older than age 25. Take precautions to prevent a fall at home.  Work with your health care provider to learn what changes you can make to improve your health and wellness and to prevent falls. This information is not intended to replace advice given to you by your health care provider. Make sure you discuss any questions you have with your health care provider. Document Released: 11/09/2016 Document Revised: 04/19/2018 Document Reviewed: 11/09/2016 Elsevier Patient Education  2020 [employer].

## 2018-11-05 NOTE — Telephone Encounter (Signed)
I have placed referral at breast center. Please let pt know to call the breast center at [phone] to schedule her 3 d diagnostic mammogram as requested.

## 2018-11-14 NOTE — Telephone Encounter (Signed)
Dr Halford Chessman, pt is requesting a refill on her detroamphetamine 15 mg daily Champ  Please advise thanks!

## 2018-11-15 MED ORDER — DEXTROAMPHETAMINE SULFATE 5 MG PO TABS: 15.0000 mg | ORAL_TABLET | Freq: Every day | ORAL | 0 refills | Status: DC

## 2018-11-15 NOTE — Telephone Encounter (Signed)
Please let her know the script was sent electronically.

## 2018-11-22 ENCOUNTER — Other Ambulatory Visit: Payer: Self-pay | Admitting: Cardiovascular Disease

## 2018-12-24 MED ORDER — DEXTROAMPHETAMINE SULFATE 5 MG PO TABS: 15.0000 mg | ORAL_TABLET | Freq: Every day | ORAL | 0 refills | Status: DC

## 2018-12-24 NOTE — Telephone Encounter (Signed)
Patient sent this message this morning.    Pls help ASAP.  I'll run out of my medication in 2 days.  Apology for rush request - my fault for not paying attention!  Please send an escript for a 30-day supply of generic dextroamphetamine: 90 5 mg tablets / 3 per day to [facility].  Kind regards,  Annyka Kienbaum  Patient's last refill was 11/15/18, #90.  Message routed to Dr. Halford Chessman

## 2018-12-24 NOTE — Telephone Encounter (Signed)
Please let her know script was sent electronically.

## 2019-01-21 ENCOUNTER — Telehealth: Payer: Self-pay | Admitting: Cardiovascular Disease

## 2019-01-23 ENCOUNTER — Other Ambulatory Visit: Payer: Self-pay

## 2019-01-23 MED ORDER — VALSARTAN 320 MG PO TABS: 320.0000 mg | ORAL_TABLET | Freq: Every day | ORAL | 3 refills | Status: DC

## 2019-01-30 ENCOUNTER — Encounter: Payer: Self-pay | Admitting: Family Medicine

## 2019-01-30 NOTE — Telephone Encounter (Signed)
Dr. Sood - please advise. Thanks. 

## 2019-02-04 MED ORDER — DEXTROAMPHETAMINE SULFATE 5 MG PO TABS: 15.0000 mg | ORAL_TABLET | Freq: Every day | ORAL | 0 refills | Status: DC

## 2019-02-04 NOTE — Telephone Encounter (Signed)
Script sent  

## 2019-02-10 ENCOUNTER — Ambulatory Visit: Payer: Medicare Other

## 2019-02-14 DIAGNOSIS — S0502XA Injury of conjunctiva and corneal abrasion without foreign body, left eye, initial encounter: Secondary | ICD-10-CM | POA: Diagnosis not present

## 2019-02-15 ENCOUNTER — Ambulatory Visit: Payer: Medicare Other

## 2019-02-15 ENCOUNTER — Ambulatory Visit: Payer: Medicare Other | Attending: Internal Medicine

## 2019-02-15 ENCOUNTER — Ambulatory Visit: Payer: Medicare Other | Admitting: Cardiovascular Disease

## 2019-02-15 DIAGNOSIS — Z23 Encounter for immunization: Secondary | ICD-10-CM

## 2019-02-15 NOTE — Progress Notes (Signed)
   Covid-19 Vaccination Clinic  Name:  Brandy [name]    MRN: [id number] DOB: 1950-06-11  02/15/2019  [name] was observed post Covid-19 immunization for 15 minutes without incidence. She was provided with Vaccine Information Sheet and instruction to access the V-Safe system.   [name] was instructed to call 911 with any severe reactions post vaccine: Marland Kitchen Difficulty breathing  . Swelling of your face and throat  . A fast heartbeat  . A bad rash all over your body  . Dizziness and weakness    Immunizations Administered    Name Date Dose VIS Date Route   Pfizer COVID-19 Vaccine 02/15/2019 11:37 AM 0.3 mL 12/21/2018 Intramuscular   Manufacturer: [place]   Lot: [id number]   [place]: [id number]

## 2019-02-21 ENCOUNTER — Encounter: Payer: Self-pay | Admitting: Pulmonary Disease

## 2019-02-21 ENCOUNTER — Telehealth (INDEPENDENT_AMBULATORY_CARE_PROVIDER_SITE_OTHER): Payer: Medicare Other | Admitting: Pulmonary Disease

## 2019-02-21 DIAGNOSIS — G47419 Narcolepsy without cataplexy: Secondary | ICD-10-CM

## 2019-02-21 MED ORDER — DEXTROAMPHETAMINE SULFATE 5 MG PO TABS: 20.0000 mg | ORAL_TABLET | Freq: Every day | ORAL | 0 refills | Status: DC

## 2019-02-21 NOTE — Patient Instructions (Signed)
Continue taking 15 mg dextroamphetamine in the morning  Will add a 5 mg dose of dextroamphetamine in the afternoon  Follow up in 6 months

## 2019-02-21 NOTE — Progress Notes (Signed)
[facility]  Chief Complaint  Patient presents with  . Follow-up    Narcolepsy without cataplexy    Constitutional:  LMP 01/10/1989 (Approximate)   Past Medical History:  OA, CAD, Bipolar with depression, Migraine HA, HLD, HTN, MVP  Brief Summary:  Brandy [name] is a 69 y.o. 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  Virtual Visit via Video Note  I connected with Brandy [name] on 02/21/19 at 10:15 AM EST by a video enabled telemedicine application and verified that I am speaking with the correct person using two identifiers.  Location: Patient: Home Provider: Medical office   I discussed the limitations of evaluation and management by telemedicine and the availability of in person appointments. The patient expressed understanding and agreed to proceed.  She is having more trouble staying awake, especially in the afternoon.  She takes 15 mg of dextrostat in the morning.  She can nap 2 hours in the morning and 2 to 3 hours in the afternoon.  She gets about 9 hours sleep at night.  She got her first COVID vaccine dose.   Physical Exam:   Alert, normal speech, calm/appropriate behavior.   Assessment/Plan:   Narcolepsy w/o cataplexy. - has progression of daytime sleepiness - will continue 15 mg dextroamphetamine in the morning, and add 5 mg as needed in the afternoon - if this adjustment doesn't improve her symptoms, them might need to add second agent  Depression. - she is followed by Dr. [name]   Patient Instructions  Continue taking 15 mg dextroamphetamine in the morning  Will add a 5 mg dose of dextroamphetamine in the afternoon  Follow up in 6 months     I discussed the assessment and treatment plan with the patient. The patient was provided an opportunity to ask questions and all were answered. The patient agreed with the plan and demonstrated an understanding of the instructions.   The patient was  advised to call back or seek an in-person evaluation if the symptoms worsen or if the condition fails to improve as anticipated.  I provided 23 minutes of non-face-to-face time during this encounter.   [name], MD  Pulmonary/Critical Care Pager: [phone] 02/21/2019, 1:07 PM  Flow Sheet    Sleep tests:  PSG 04/30/07 >>AHI 0.5, PLMI 5.2  MSLT 04/30/07 >> mean sleep latency 4 min, 5/5 naps, 0/5 SOREM  Medications:   Allergies as of 02/21/2019      Reactions   Penicillins Anaphylaxis   Has patient had a PCN reaction causing immediate rash, facial/tongue/throat swelling, SOB or lightheadedness with hypotension: Yes Has patient had a PCN reaction causing severe rash involving mucus membranes or skin necrosis: No Has patient had a PCN reaction that required hospitalization Yes Has patient had a PCN reaction occurring within the last 10 years: No If all of the above answers are "NO", then may proceed with Cephalosporin use.   Pentazocine Other (See Comments)   Bextra [valdecoxib] Hives   Codeine Nausea And Vomiting   Lactose Intolerance (gi) Diarrhea   Meperidine Hcl Nausea And Vomiting   Morphine Nausea And Vomiting   Pentazocine Lactate    Muscle spasms       Medication List       Accurate as of February 21, 2019  1:07 PM. If you have any questions, ask your nurse or doctor.        Calcium 500+D 500-400 MG-UNIT Tabs Generic drug: Calcium Carb-Cholecalciferol Take 2 tablets by mouth daily.  dextroamphetamine 5 MG tablet Commonly known as: DEXTROSTAT Take 4 tablets (20 mg total) by mouth daily. What changed: how much to take Changed by: [name], MD   famotidine 20 MG tablet Commonly known as: PEPCID Take 20 mg by mouth daily.   FLUoxetine 20 MG capsule Commonly known as: PROZAC Take 1 capsule (20 mg total) by mouth daily.   fluticasone 50 MCG/ACT nasal spray Commonly known as: FLONASE Place 2 sprays into both nostrils daily.   mometasone  0.1 % cream Commonly known as: ELOCON as needed.   Multiple Vitamins/Womens tablet Take 1 tablet by mouth daily.   NAPROSYN PO naproxen   valsartan 320 MG tablet Commonly known as: DIOVAN Take 1 tablet (320 mg total) by mouth daily.   Vitamin D3 125 MCG (5000 UT) Caps Take 1 capsule by mouth daily.       Past Surgical History:  She  has a past surgical history that includes trach; myomectomy (1981); Vesicovaginal fistula closure w/ TAH; Rotator cuff repair (1998); Coronary artery bypass graft (03/21/05); Shoulder arthroscopy (2006); Total abdominal hysterectomy (1991); cataract surgery; Total hip arthroplasty (Left, 03/01/2016); and reconstructive surgery (1945).  Family History:  Her family history includes Alcohol abuse in her maternal grandfather; Aneurysm in her mother; Arthritis in her maternal grandmother; Bipolar disorder in her mother and another family member; Hypertension in her mother and another family member; Kidney disease in her mother; Lung cancer in her father; Osteoporosis in her maternal grandmother; Stroke in her mother.  Social History:  She  reports that she has never smoked. She has never used smokeless tobacco. She reports that she does not drink alcohol or use drugs.

## 2019-03-12 ENCOUNTER — Ambulatory Visit: Payer: Medicare Other | Attending: Internal Medicine

## 2019-03-12 DIAGNOSIS — Z23 Encounter for immunization: Secondary | ICD-10-CM

## 2019-03-12 NOTE — Progress Notes (Signed)
   Covid-19 Vaccination Clinic  Name:  Brandy [name]    MRN: [id number] DOB: [birth year]  03/12/2019  [name] was observed post Covid-19 immunization for 30 minutes based on pre-vaccination screening without incident. She was provided with Vaccine Information Sheet and instruction to access the V-Safe system.   [name] was instructed to call 911 with any severe reactions post vaccine: Marland Kitchen Difficulty breathing  . Swelling of face and throat  . A fast heartbeat  . A bad rash all over body  . Dizziness and weakness   Immunizations Administered    Name Date Dose VIS Date Route   Pfizer COVID-19 Vaccine 03/12/2019 12:32 PM 0.3 mL 12/21/2018 Intramuscular   Manufacturer: [place]   Lot: [id number]   [place]: [id number]

## 2019-04-05 MED ORDER — DEXTROAMPHETAMINE SULFATE 5 MG PO TABS: 20.0000 mg | ORAL_TABLET | Freq: Every day | ORAL | 0 refills | Status: DC

## 2019-04-05 NOTE — Telephone Encounter (Signed)
Dr. Halford Chessman, Patient is requesting refill be sent in for dextroamphetamine 5 mg tablets/ 4 tablets daily/ 30 day supply =120 tablets  Please advise. Thank you

## 2019-04-05 NOTE — Telephone Encounter (Signed)
Called and spoke with patient letting her know that prescription has been sent to [facility] per Dr. Halford Chessman. Patient expressed understanding. Nothing further needed at this time.

## 2019-04-05 NOTE — Telephone Encounter (Signed)
Please let her know refill has been sent

## 2019-04-09 NOTE — Telephone Encounter (Signed)
Called Brandy [name] this morning to see if they got the prescription. Was told that the medication is out of stock but that they would be getting it today and that the patient could pick it up today after 3pm. Message sent to patient. Nothing further needed at this time.

## 2019-04-29 ENCOUNTER — Other Ambulatory Visit: Payer: Self-pay

## 2019-04-29 ENCOUNTER — Encounter: Payer: Self-pay | Admitting: Cardiovascular Disease

## 2019-04-29 ENCOUNTER — Ambulatory Visit (INDEPENDENT_AMBULATORY_CARE_PROVIDER_SITE_OTHER): Payer: Medicare Other | Admitting: Cardiovascular Disease

## 2019-04-29 DIAGNOSIS — F32A Depression, unspecified: Secondary | ICD-10-CM

## 2019-04-29 DIAGNOSIS — F329 Major depressive disorder, single episode, unspecified: Secondary | ICD-10-CM

## 2019-04-29 DIAGNOSIS — Z79899 Other long term (current) drug therapy: Secondary | ICD-10-CM

## 2019-04-29 DIAGNOSIS — R002 Palpitations: Secondary | ICD-10-CM

## 2019-04-29 DIAGNOSIS — I10 Essential (primary) hypertension: Secondary | ICD-10-CM

## 2019-04-29 DIAGNOSIS — E7849 Other hyperlipidemia: Secondary | ICD-10-CM

## 2019-04-29 DIAGNOSIS — I2583 Coronary atherosclerosis due to lipid rich plaque: Secondary | ICD-10-CM | POA: Diagnosis not present

## 2019-04-29 DIAGNOSIS — E785 Hyperlipidemia, unspecified: Secondary | ICD-10-CM | POA: Diagnosis not present

## 2019-04-29 DIAGNOSIS — G4711 Idiopathic hypersomnia with long sleep time: Secondary | ICD-10-CM | POA: Diagnosis not present

## 2019-04-29 DIAGNOSIS — Z951 Presence of aortocoronary bypass graft: Secondary | ICD-10-CM

## 2019-04-29 DIAGNOSIS — I2510 Atherosclerotic heart disease of native coronary artery without angina pectoris: Secondary | ICD-10-CM

## 2019-04-29 DIAGNOSIS — G4723 Circadian rhythm sleep disorder, irregular sleep wake type: Secondary | ICD-10-CM | POA: Diagnosis not present

## 2019-04-29 DIAGNOSIS — F319 Bipolar disorder, unspecified: Secondary | ICD-10-CM

## 2019-04-29 LAB — COMPREHENSIVE METABOLIC PANEL
ALT: 16 IU/L (ref 0–32)
AST: 23 IU/L (ref 0–40)
Albumin/Globulin Ratio: 1.9 (ref 1.2–2.2)
Albumin: 4.2 g/dL (ref 3.8–4.8)
Alkaline Phosphatase: 131 IU/L — ABNORMAL HIGH (ref 39–117)
BUN/Creatinine Ratio: 26 (ref 12–28)
BUN: 18 mg/dL (ref 8–27)
Bilirubin Total: 0.5 mg/dL (ref 0.0–1.2)
CO2: 21 mmol/L (ref 20–29)
Calcium: 9.4 mg/dL (ref 8.7–10.3)
Chloride: 104 mmol/L (ref 96–106)
Creatinine, Ser: 0.7 mg/dL (ref 0.57–1.00)
GFR calc Af Amer: 102 mL/min/{1.73_m2} (ref >59)
GFR calc non Af Amer: 88 mL/min/{1.73_m2} (ref >59)
Globulin, Total: 2.2 g/dL (ref 1.5–4.5)
Glucose: 83 mg/dL (ref 65–99)
Potassium: 4.9 mmol/L (ref 3.5–5.2)
Sodium: 142 mmol/L (ref 134–144)
Total Protein: 6.4 g/dL (ref 6.0–8.5)

## 2019-04-29 LAB — CBC
Hematocrit: 42.1 % (ref 34.0–46.6)
Hemoglobin: 14.1 g/dL (ref 11.1–15.9)
MCH: 29 pg (ref 26.6–33.0)
MCHC: 33.5 g/dL (ref 31.5–35.7)
MCV: 87 fL (ref 79–97)
Platelets: 269 10*3/uL (ref 150–450)
RBC: 4.86 x10E6/uL (ref 3.77–5.28)
RDW: 13.1 % (ref 11.7–15.4)
WBC: 5.2 10*3/uL (ref 3.4–10.8)

## 2019-04-29 LAB — TSH: TSH: 2.68 u[IU]/mL (ref 0.450–4.500)

## 2019-04-29 LAB — LIPID PANEL
Chol/HDL Ratio: 4 ratio (ref 0.0–4.4)
Cholesterol, Total: 261 mg/dL — ABNORMAL HIGH (ref 100–199)
HDL: 65 mg/dL (ref >39)
LDL Chol Calc (NIH): 176 mg/dL — ABNORMAL HIGH (ref 0–99)
Triglycerides: 112 mg/dL (ref 0–149)
VLDL Cholesterol Cal: 20 mg/dL (ref 5–40)

## 2019-04-29 MED ORDER — AMLODIPINE BESYLATE 5 MG PO TABS: 5.0000 mg | ORAL_TABLET | Freq: Every day | ORAL | 3 refills | Status: DC

## 2019-04-29 NOTE — Progress Notes (Signed)
Is patient ID: Brandy [name], female   DOB: Nov 19, 1950, 69 y.o.   MRN: [id number]             HPI: Brandy [name], is a 69 y.o. female who presents to the office for a 25 month follow-up cardiology evaluation.  In 2007 Brandy [name] was not having significant symptoms but because of a family history for peripheral vascular disease, abdominal aortic aneurysm, and somewhat labile blood pressure a Cardiolite study was done which suggested anterior wall ischemia. Subsequent cardiac catheterization revealing 95% ostial LAD stenosis. She underwent off pump LIMA to LAD bypass surgery by Brandy [name] on 03/21/2005.  A echo Doppler study in April 2011 revealed an ejection fraction greater than 55%. There was grade 1 diastolic dysfunction. She had mild MR and nuclear perfusion scan was normal.  I had not seen her in over 3 years and she had stopped taking medication for blood pressure.  She recently saw Brandy [name] and her blood pressure was significantly elevated at 169/ 100.  She resumed taking Diovan and recently has been taking this at 160 mg daily.  She has a history of hyperlipidemia and has been taking Crestor 20 mg.  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  She has developed left hip discomfort and needs surgery.  She also has a history of idiopathic hypersomnolence, and has been taking Provigil 200 mg daily for this.  When I saw her in 2017, her exam revealed a 2/6 systolic murmur along the left sternal border.  She was in need for left hip replacement surgery. I recommended that she undergo a Gideon study for preoperative assessment and this was done on 12/08/2015 which revealed normal perfusion and function.  A 2-D echo Doppler study which was done on 01/14/2016 showed an EF of 65-70%.  There was mild grade 1 diastolic dysfunction.  She had mitral annular calcification with trivial MR.  There was mild TR.  There was  no evidence for aortic stenosis.  PA pressure was normal.  I saw her in follow-up in January 2018, and she was given clearance for surgery.  I last saw her in March 2019 and over the previous  14 months, she denied any episodes of chest pain, PND, orthopnea, palpitations, presyncope or syncope.  She has had some blood pressure variation.  Her valsartan was switched irbesartan due to the generic impurities.  Irbesartan was further increased to 300 mg due to recurrent blood pressure elevation  She had been on rosuvastatin, but stopped taking this several months ago due to myalgias.  During that evaluation her blood pressure was elevated and I added amlodipine 2.5 mg with target LDL less than 130.  She had stopped taking her rosuvastatin for hyperlipidemia.  She also was taking Provigil for idiopathic hypersomnia.  I have not seen her over the past 2 years.  She has had issues with depression and sees Brandy [name] of psychiatry.  She has continued to be on Prozac 20 mg daily.  Remotely she was felt to be bipolar but is not felt to have this diagnosis any longer.  Apparently she became intolerant to Provigil and now has been on dextroamphetamine for her idiopathic hypersomnia.  Her MSLT test in April 2009 showed mean sleep latency at 4 minutes with 5 out of 5 naps and 0 naps with sleep onset REM.  On her more recent dose of 15 mg dextroamphetamine  she was still having difficulty with significant daytime sleepiness and was last evaluated by Brandy [name] in a telemedicine visit it was recommended she increase her dose and as such she has been taking 15 mg in the morning and 5 mg  afternoon.  She has noted some increased palpitations on her increased extra amphetamine.  She denies any significant chest pain.  She does yard work as needed.  She still feels that she needs to take daytime naps.  She presents for a 80-monthevaluation.  Past Medical History:  Diagnosis Date  . Arthritis   . Bipolar 1 disorder ([place]   . CAD  (coronary artery disease) 2007   CABG X 1  . Depression    controlled  . Hay fever   . Headache    Hx of migraines  . Heart murmur   . Hyperlipidemia   . Hypertension   . Idiopathic hypersomnia   . Mitral valve prolapse   . PONV (postoperative nausea and vomiting)   . Urinary incontinence     Past Surgical History:  Procedure Laterality Date  . cataract surgery     BIL  . CORONARY ARTERY BYPASS GRAFT  03/21/05   off pump LIMA-LAD  . myomectomy  1981  . reconstructive surgery  1945   lip   . RRomeo  3 times  . SHOULDER ARTHROSCOPY  2006  . TOTAL ABDOMINAL HYSTERECTOMY  1991  . TOTAL HIP ARTHROPLASTY Left 03/01/2016   Procedure: LEFT TOTAL HIP ARTHROPLASTY ANTERIOR APPROACH;  Surgeon: Brandy Cancel MD;  Location: [facility];  Service: Orthopedics;  Laterality: Left;  Requests 70 mins  . trach    . VESICOVAGINAL FISTULA CLOSURE W/ TAH      Allergies  Allergen Reactions  . Penicillins Anaphylaxis    Has patient had a PCN reaction causing immediate rash, facial/tongue/throat swelling, SOB or lightheadedness with hypotension: Yes Has patient had a PCN reaction causing severe rash involving mucus membranes or skin necrosis: No Has patient had a PCN reaction that required hospitalization Yes Has patient had a PCN reaction occurring within the last 10 years: No If all of the above answers are "NO", then may proceed with Cephalosporin use.  .Marland KitchenPentazocine Other (See Comments)  . Bextra [Valdecoxib] Hives  . Codeine Nausea And Vomiting  . Lactose Intolerance (Gi) Diarrhea  . Meperidine Hcl Nausea And Vomiting  . Morphine Nausea And Vomiting  . Pentazocine Lactate     Muscle spasms     Current Outpatient Medications  Medication Sig Dispense Refill  . Calcium Carb-Cholecalciferol (CALCIUM 500+D) 500-400 MG-UNIT TABS Take 2 tablets by mouth daily.    . Cholecalciferol (VITAMIN D3) 125 MCG (5000 UT) CAPS Take 1 capsule by mouth daily.    .Marland Kitchendextroamphetamine  (DEXTROSTAT) 5 MG tablet Take 4 tablets (20 mg total) by mouth daily. 120 tablet 0  . famotidine (PEPCID) 20 MG tablet Take 20 mg by mouth daily.    .Marland KitchenFLUoxetine (PROZAC) 20 MG capsule Take 1 capsule (20 mg total) by mouth daily. 90 capsule 3  . fluticasone (FLONASE) 50 MCG/ACT nasal spray Place 2 sprays into both nostrils daily. 16 g 2  . mometasone (ELOCON) 0.1 % cream as needed.    . Multiple Vitamins-Minerals (MULTIPLE VITAMINS/WOMENS) tablet Take 1 tablet by mouth daily.    . Naproxen (NAPROSYN PO) naproxen    . valsartan (DIOVAN) 320 MG tablet Take 1 tablet (320 mg total) by mouth daily. 90 tablet 3  .  amLODipine (NORVASC) 5 MG tablet Take 1 tablet (5 mg total) by mouth daily. 90 tablet 3   Current Facility-Administered Medications  Medication Dose Route Frequency Provider Last Rate Last Admin  . ipratropium-albuterol (DUONEB) 0.5-2.5 (3) MG/3ML nebulizer solution 3 mL  3 mL Nebulization Once Dorothyann Peng, NP        Social History   Socioeconomic History  . Marital status: Divorced    Spouse name: Not on file  . Number of children: Not on file  . Years of education: Not on file  . Highest education level: Not on file  Occupational History  . Occupation: retired  Tobacco Use  . Smoking status: Never Smoker  . Smokeless tobacco: Never Used  Substance and Sexual Activity  . Alcohol use: No  . Drug use: No  . Sexual activity: Yes  Other Topics Concern  . Not on file  Social History Narrative  . Not on file   Social Determinants of Health   Financial Resource Strain:   . Difficulty of Paying Living Expenses:   Food Insecurity:   . Worried About Charity fundraiser in the Last Year:   . Arboriculturist in the Last Year:   Transportation Needs:   . Film/video editor (Medical):   Marland Kitchen Lack of Transportation (Non-Medical):   Physical Activity:   . Days of Exercise per Week:   . Minutes of Exercise per Session:   Stress:   . Feeling of Stress :   Social  Connections:   . Frequency of Communication with Friends and Family:   . Frequency of Social Gatherings with Friends and Family:   . Attends Religious Services:   . Active Member of Clubs or Organizations:   . Attends Archivist Meetings:   Marland Kitchen Marital Status:   Intimate Partner Violence:   . Fear of Current or Ex-Partner:   . Emotionally Abused:   Marland Kitchen Physically Abused:   . Sexually Abused:     Family History  Problem Relation Age of Onset  . Stroke Mother        died 56  . Aneurysm Mother   . Hypertension Mother   . Kidney disease Mother   . Bipolar disorder Mother   . Lung cancer Father        died 14  . Bipolar disorder Other   . Hypertension Other   . Osteoporosis Maternal Grandmother   . Arthritis Maternal Grandmother   . Alcohol abuse Maternal Grandfather    Socially she is divorced for >25 years. She is retired from [employer], [employer] since 2013.  She does not have any children. There is no tobacco use. No alcohol use. She does exercise at least 3 days per week  ROS General: Negative; No fevers, chills, or night sweats;  HEENT: Negative; No changes in vision or hearing, sinus congestion, difficulty swallowing Pulmonary: Negative; No cough, wheezing, shortness of breath, hemoptysis Cardiovascular: see HPI GI: Negative; No nausea, vomiting, diarrhea, or abdominal pain GU: Negative; No dysuria, hematuria, or difficulty voiding Musculoskeletal: Positive for left hip degeneration Hematologic/Oncology: Negative; no easy bruising, bleeding Endocrine: Negative; no heat/cold intolerance; no diabetes Neuro: Negative; no changes in balance, headaches Skin: Negative; No rashes or skin lesions Psychiatric: No longer diagnosed with bipolar disorder.  She continues to have episodic depression. Sleep: Positive for idiopathic hypersomnia with 5 out of 5 naps on and MSLT evaluation and 0 SOREM; no narcolepsy or cataplectic events.  No sleep apnea with PSG AHI  0.5   Other comprehensive 14 point system review is negative.   PE BP (!) 172/80   Pulse 71   Ht 5' (1.524 m)   Wt 112 lb 12.8 oz (51.2 kg)   LMP 01/10/1989 (Approximate)   SpO2 99%   BMI 22.03 kg/m     Wt Readings from Last 3 Encounters:  04/29/19 112 lb 12.8 oz (51.2 kg)  10/24/18 107 lb 6.4 oz (48.7 kg)  08/10/18 109 lb 3.2 oz (49.5 kg)   General: Alert, oriented, no distress.  Skin: normal turgor, no rashes, warm and dry HEENT: Normocephalic, atraumatic. Pupils equal round and reactive to light; sclera anicteric; extraocular muscles intact;  Nose without nasal septal hypertrophy Mouth/Parynx benign; Mallinpatti scale 2 Neck: No JVD, no carotid bruits; normal carotid upstroke Lungs: clear to ausculatation and percussion; no wheezing or rales Chest wall: without tenderness to palpitation Heart: PMI not displaced, RRR, s1 s2 normal, 1/6 systolic murmur, no diastolic murmur, no rubs, gallops, thrills, or heaves Abdomen: soft, nontender; no hepatosplenomehaly, BS+; abdominal aorta nontender and not dilated by palpation. Back: no CVA tenderness Pulses 2+ Musculoskeletal: full range of motion, normal strength, no joint deformities Extremities: no clubbing cyanosis or edema, Homan's sign negative  Neurologic: grossly nonfocal; Cranial nerves grossly wnl Psychologic: Normal mood and affect  ECG (independently read by me): NSR at 71; possible LAE; no ectopy; normal intervals  March 2019 ECG (independently read by me): Sinus rhythm at 62  bpm.  Normal intervals.  No ST segment changes.  No ectopy.  January 2018 ECG (independently read by me): Normal sinus rhythm at 75 bpm.  No ST segment changes.  QTc interval normal at 422 Brandy.  No ectopy.  November 2017 ECG (independently read by me): Normal sinus rhythm at 67 bpm.  Normal intervals.  No significant ST-T changes.  November 2014 ECG: Sinus rhythm at 64 beats per minute. QTc interval 412 Brandy. Agnostic Q-wave in lead III and  F  LABS:  BMP Latest Ref Rng & Units 04/29/2019 09/20/2016 03/02/2016  Glucose 65 - 99 mg/dL 83 80 137(H)  BUN 8 - 27 mg/dL 18 29(H) 17  Creatinine 0.57 - 1.00 mg/dL 0.70 0.73 0.74  BUN/Creat Ratio 12 - 28 26 - -  Sodium 134 - 144 mmol/L 142 143 142  Potassium 3.5 - 5.2 mmol/L 4.9 4.2 4.4  Chloride 96 - 106 mmol/L 104 102 111  CO2 20 - 29 mmol/L '21 28 28  '$ Calcium 8.7 - 10.3 mg/dL 9.4 10.1 8.5(L)   Hepatic Function Latest Ref Rng & Units 04/29/2019 09/20/2016 08/11/2015  Total Protein 6.0 - 8.5 g/dL 6.4 6.8 6.4  Albumin 3.8 - 4.8 g/dL 4.2 4.6 4.2  AST 0 - 40 IU/L '23 20 21  '$ ALT 0 - 32 IU/L '16 22 12  '$ Alk Phosphatase 39 - 117 IU/L 131(H) 104 91  Total Bilirubin 0.0 - 1.2 mg/dL 0.5 0.5 0.5   CBC Latest Ref Rng & Units 04/29/2019 09/20/2016 03/02/2016  WBC 3.4 - 10.8 x10E3/uL 5.2 8.9 10.7(H)  Hemoglobin 11.1 - 15.9 g/dL 14.1 14.3 9.6(L)  Hematocrit 34.0 - 46.6 % 42.1 43.5 30.3(L)  Platelets 150 - 450 x10E3/uL 269 319.0 215   Lab Results  Component Value Date   MCV 87 04/29/2019   MCV 92.3 09/20/2016   MCV 89.1 03/02/2016   Lab Results  Component Value Date   TSH 2.680 04/29/2019   Lipid Panel     Component Value Date/Time   CHOL 261 (H) 04/29/2019 0908  TRIG 112 04/29/2019 0908   HDL 65 04/29/2019 0908   CHOLHDL 4.0 04/29/2019 0908   CHOLHDL 2 09/20/2016 1644   VLDL 30.0 09/20/2016 1644   LDLCALC 176 (H) 04/29/2019 0908    RADIOLOGY: No results found.  IMPRESSION:  1. Coronary artery disease due to lipid rich plaque   2. Hx of CABG: March 21, 2005 off-pump LIMA to LAD by Brandy [name]   3. Essential hypertension   4. Palpitations   5. Hyperlipidemia with target LDL less than 70   6. Idiopathic hypersomnia   7. Depression, unspecified depression type   8. Medication management     ASSESSMENT AND PLAN: Brandy [name] is a 69 year-old Caucasian female who underwent CABG revascularization surgery in 2007 after cardiac catheterization revealed 95% ostial LAD stenosis  encroaching upon the left main. She underwent off-pump LIMA to LAD bypass. Prior to her surgery, she was completely asymptomatic and anterior ischemia was noted routinely on Cardiolite imaging.  Her last nuclear perfusion study in November 2017 continued to show normal perfusion.  She has normal to hyperdynamic ejection fraction and mitral annular calcification and there was no evidence for aortic stenosis.  There was mild MR and TR. he denies any instability now 14 years status post CABG revascularization surgery.  She has a history of hypertension and blood pressure today is significantly elevated despite taking valsartan 320 mg daily.  When I last saw her over 2 years ago I did recommend the addition of low-dose amlodipine.  She apparently has not taken this.  I am now adding amlodipine at 5 mg daily for more optimal blood pressure control.  I discussed with her new hypertensive guidelines with target blood pressure less than 130/80 and ideal blood pressure less than 120/80.  She has been on an increased dose of dextroamphetamine which may be contributing to her elevated blood pressure.  She continues to have significant excessive daytime sleepiness as result of her idiopathic hypersomnolence.  She apparently became  Nuvigil or Provigil became ineffective and she was switched to dextroamphetamine..  Her dose of dextroamphetamine was recently increased by Brandy [name] and most recently she has been taking 15 mg in the morning and 5 mg in the afternoon due to her continued daytime sleepiness and need to take afternoon naps.  Remotely she was diagnosed with bipolar disease but she has continued to be followed by Brandy [name] her psychiatrist and she was told now that she does not have bipolar disease.  She continues to take Prozac for her episodic depression.  We will schedule her for complete set of fasting laboratory including a comprehensive metabolic panel, CBC, TSH, and lipid studies.  She is no longer on  lipid-lowering therapy.  I scheduled her for an echo Doppler study to assess systolic and diastolic function.  In the future, I will schedule her for follow-up exercise nuclear study particularly since she never experienced any chest pain prior to her catheterization which revealed 95% ostial LAD stenosis.  Since her blood pressure is significantly elevated she will be seen by her our pharmacist in 1 month adjustments to her medical therapy will be made for more optimal blood pressure control.  I will see her in 2 months for reevaluation.   Troy Sine, MD, [facility]  05/05/2019 10:52 AM

## 2019-04-29 NOTE — Patient Instructions (Signed)
Medication Instructions:  BEGIN TAKING AMLODIPINE 5MG  DAILY *If you need a refill on your cardiac medications before your next appointment, please call your pharmacy*   Lab Work: FASTING LABS: CMET CBC TSH LIPID If you have labs (blood work) drawn today and your tests are completely normal, you will receive your results only by: Marland Kitchen MyChart Message (if you have MyChart) OR . A paper copy in the mail If you have any lab test that is abnormal or we need to change your treatment, we will call you to review the results.   Testing/Procedures: Your physician has requested that you have an echocardiogram. Echocardiography is a painless test that uses sound waves to create images of your heart. It provides your doctor with information about the size and shape of your heart and how well your heart's chambers and valves are working. This procedure takes approximately one hour. There are no restrictions for this procedure.  [place]   Follow-Up: At [facility], you and your health needs are our priority.  As part of our continuing mission to provide you with exceptional heart care, we have created designated Provider Care Teams.  These Care Teams include your primary Cardiologist (physician) and Advanced Practice Providers (APPs -  Physician Assistants and Nurse Practitioners) who all work together to provide you with the care you need, when you need it.  We recommend signing up for the patient portal called "MyChart".  Sign up information is provided on this After Visit Summary.  MyChart is used to connect with patients for Virtual Visits (Telemedicine).  Patients are able to view lab/test results, encounter notes, upcoming appointments, etc.  Non-urgent messages can be sent to your provider as well.   To learn more about what you can do with MyChart, go to [email or web address].    Your next appointment:   2-3 month(s)  The format for your next appointment:   In  Person  Provider:   Shelva Majestic, MD   Other Instructions FOLLOW UP WITH KRISTIN OR RAQUEL FOR BP IN 4 WEEKS

## 2019-04-30 ENCOUNTER — Telehealth: Payer: Self-pay

## 2019-04-30 NOTE — Telephone Encounter (Signed)
Called and lmomed the pt to call us back and let us know if she can mve 05/27/19 pharmacy appt to same day at 1030

## 2019-05-01 ENCOUNTER — Other Ambulatory Visit: Payer: Self-pay

## 2019-05-01 ENCOUNTER — Ambulatory Visit (HOSPITAL_COMMUNITY): Payer: Medicare Other | Attending: Cardiology

## 2019-05-01 DIAGNOSIS — E7849 Other hyperlipidemia: Secondary | ICD-10-CM | POA: Diagnosis not present

## 2019-05-01 DIAGNOSIS — I2583 Coronary atherosclerosis due to lipid rich plaque: Secondary | ICD-10-CM | POA: Diagnosis not present

## 2019-05-01 DIAGNOSIS — G4723 Circadian rhythm sleep disorder, irregular sleep wake type: Secondary | ICD-10-CM

## 2019-05-01 DIAGNOSIS — I10 Essential (primary) hypertension: Secondary | ICD-10-CM

## 2019-05-01 DIAGNOSIS — I2510 Atherosclerotic heart disease of native coronary artery without angina pectoris: Secondary | ICD-10-CM

## 2019-05-01 DIAGNOSIS — G4711 Idiopathic hypersomnia with long sleep time: Secondary | ICD-10-CM | POA: Diagnosis not present

## 2019-05-05 ENCOUNTER — Encounter: Payer: Self-pay | Admitting: Cardiovascular Disease

## 2019-05-06 ENCOUNTER — Telehealth: Payer: Self-pay | Admitting: Psychiatry

## 2019-05-06 NOTE — Telephone Encounter (Signed)
Brandy [name] called because she has noticed in AVS from other Drs. And on My Chart her diagnosis is still showing Bi Polar Disorder.  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  She would just like her reports to/from other Drs to show the correct diagnosis and would like to see that change in My Chart.  It there a way to correct it?

## 2019-05-06 NOTE — Telephone Encounter (Signed)
If she gives me the names of the other doctors involved I can send them a note through epic and send them a copy of my note indicating that the diagnosis has changed

## 2019-05-07 ENCOUNTER — Encounter: Payer: Self-pay | Admitting: Psychiatry

## 2019-05-07 NOTE — Telephone Encounter (Signed)
I have never done this before in epic, however I think I was able to switch it from bipolar disorder to episodic mood disorder in epic in such a way that bipolar disorder does not show up anymore.

## 2019-05-10 NOTE — Telephone Encounter (Signed)
Dr. Sood please advise 

## 2019-05-14 ENCOUNTER — Other Ambulatory Visit: Payer: Self-pay | Admitting: Pulmonary Disease

## 2019-05-14 MED ORDER — DEXTROAMPHETAMINE SULFATE 5 MG PO TABS: 20.0000 mg | ORAL_TABLET | Freq: Every day | ORAL | 0 refills | Status: DC

## 2019-05-20 ENCOUNTER — Telehealth: Payer: Self-pay

## 2019-05-20 NOTE — Telephone Encounter (Signed)
Called and spoke with pt. Reviewed results. Pt would like to do research on repatha and get back with [place] before starting any other medications. She states she was doing readings on Statins and their side effects and would like another option besides the statin medications. 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 Pt had originally been on rosuvastatin but had the muscle aches with this. She reports that she reads many medical articles and would like to look into the [place].  She states she may be willing to try one of the statin medications again but wants to do some research first and then will call back with her decision.  She wants to let Dr.Kelly know that since adding amlodipine to her medication regimen her blood pressure have been much better ( 131/79  134/75  130/73) She still would like to know what was causing her palpitations. She states she cannot tell if she is still having them, however she has felt better with the additional BP medication.  Notified I would send this message to [facility] for revew  Pt verbalized understanding with no other questions at this time

## 2019-05-21 NOTE — Telephone Encounter (Signed)
acknowledged

## 2019-05-22 ENCOUNTER — Other Ambulatory Visit: Payer: Self-pay

## 2019-05-22 MED ORDER — ROSUVASTATIN CALCIUM 40 MG PO TABS: 40.0000 mg | ORAL_TABLET | Freq: Every day | ORAL | 3 refills | Status: DC

## 2019-05-22 NOTE — Telephone Encounter (Signed)
Noted. Prescription for rosuvastatin 40mg  sent to pharmacy. Still waiting response from [facility] regarding palpitations.

## 2019-05-22 NOTE — Progress Notes (Signed)
Prescription for Rosuvastatin 40mg  sent to pharmacy

## 2019-05-22 NOTE — Telephone Encounter (Signed)
Patient calling back stating she has decided to take crestor.

## 2019-05-27 ENCOUNTER — Other Ambulatory Visit: Payer: Self-pay

## 2019-05-27 ENCOUNTER — Ambulatory Visit (INDEPENDENT_AMBULATORY_CARE_PROVIDER_SITE_OTHER): Payer: Medicare Other | Admitting: Pharmacist Clinician (PhC)/ Clinical Pharmacy Specialist

## 2019-05-27 ENCOUNTER — Ambulatory Visit: Payer: Medicare Other

## 2019-05-27 DIAGNOSIS — I10 Essential (primary) hypertension: Secondary | ICD-10-CM

## 2019-05-27 MED ORDER — AMLODIPINE BESYLATE 10 MG PO TABS: 10.0000 mg | ORAL_TABLET | Freq: Every day | ORAL | 3 refills | Status: DC

## 2019-05-27 NOTE — Progress Notes (Signed)
05/27/2019 Brandy [name] 1950/11/02 [id number]   HPI:  Brandy [name] is a 69 y.o. female patient of Dr Claiborne Billings, with a PMH below who presents today for hypertension clinic evaluation.  She was last seen by Dr. Claiborne Billings in April and found to have a blood pressure of 172/80.  He added amlodipine 5 mg to her valsartan 320 mg.   She also has been taking dextroamphetamine 15-20 mg daily for her idiopathic hypersomnia, which she knows can increase her blood pressure.  Unfortunately this is something that she needs to take in order to function on a daily basis.    Today she is in the office for follow up.  She notes that her home BP readings have slowly trended downward over the past month, and seem to have settled in the [id number] systolic range.  She currently takes both medications in the evenings.  She also notes that her morning readings tend to be about 10 points lower than evening readings, which sometimes trend up to 150's.       Past Medical History: CAD CABG [place] to LAD 03/2005  hyperlipidemia 4/21:  TC 261, TG 112, HDL 65, LDL 176  Idiopathic hypersomnia On dextroamphetamine 15-20 mg daily     Blood Pressure Goal:  130/80  Current Medications: amlodipine 5 mg qd, valsartan 320 mg qd - both in evenings  Family Hx: mother died at 56 from brain aneurysm, overweigh, htn, smoker; father died at 93 from lung cancer, paternal grandparents had cardiac disease; no siblings or children  Social Hx: no tobacco; no alcohol; moderates caffeine due to GERD;   Diet: mostly home cooked, does not add salt regularly; uses light salt (potassium and calcium); not big meat eater, some days more vegetarian style; not much for canned veggies, mostly fresh and frozen; snacks on nuts, hummus, veggies, peanut butter  Exercise: all own yard work, two days a week takes rowing class  Home BP readings: has trended down slowly since April, now mostly 0000000 systolic/75-85 , does get occasional systolic <  [id number], none < 123456.    Intolerances: no cardiac medication intolerances  Labs:  4/19:  Na 142, K 4.9, Glu 83, BUN 18, SCr 0.70  Wt Readings from Last 3 Encounters:  05/27/19 112 lb 12.8 oz (51.2 kg)  04/29/19 112 lb 12.8 oz (51.2 kg)  10/24/18 107 lb 6.4 oz (48.7 kg)   BP Readings from Last 3 Encounters:  05/27/19 136/72  04/29/19 (!) 172/80  10/24/18 127/73   Pulse Readings from Last 3 Encounters:  05/27/19 74  04/29/19 71  08/10/18 76    Current Outpatient Medications  Medication Sig Dispense Refill  . amLODipine (NORVASC) 10 MG tablet Take 1 tablet (10 mg total) by mouth daily. 90 tablet 3  . Calcium Carb-Cholecalciferol (CALCIUM 500+D) 500-400 MG-UNIT TABS Take 2 tablets by mouth daily.    . Cholecalciferol (VITAMIN D3) 125 MCG (5000 UT) CAPS Take 1 capsule by mouth daily.    Marland Kitchen dextroamphetamine (DEXTROSTAT) 5 MG tablet Take 4 tablets (20 mg total) by mouth daily. 120 tablet 0  . famotidine (PEPCID) 20 MG tablet Take 20 mg by mouth daily.    Marland Kitchen FLUoxetine (PROZAC) 20 MG capsule Take 1 capsule (20 mg total) by mouth daily. 90 capsule 3  . fluticasone (FLONASE) 50 MCG/ACT nasal spray Place 2 sprays into both nostrils daily. 16 g 2  . mometasone (ELOCON) 0.1 % cream as needed.    . Multiple Vitamins-Minerals (MULTIPLE VITAMINS/WOMENS)  tablet Take 1 tablet by mouth daily.    . Naproxen (NAPROSYN PO) naproxen    . rosuvastatin (CRESTOR) 40 MG tablet Take 1 tablet (40 mg total) by mouth daily. 90 tablet 3  . valsartan (DIOVAN) 320 MG tablet Take 1 tablet (320 mg total) by mouth daily. 90 tablet 3   Current Facility-Administered Medications  Medication Dose Route Frequency Provider Last Rate Last Admin  . ipratropium-albuterol (DUONEB) 0.5-2.5 (3) MG/3ML nebulizer solution 3 mL  3 mL Nebulization Once Dorothyann Peng, NP        Allergies  Allergen Reactions  . Penicillins Anaphylaxis    Has patient had a PCN reaction causing immediate rash, facial/tongue/throat swelling, SOB or  lightheadedness with hypotension: Yes Has patient had a PCN reaction causing severe rash involving mucus membranes or skin necrosis: No Has patient had a PCN reaction that required hospitalization Yes Has patient had a PCN reaction occurring within the last 10 years: No If all of the above answers are "NO", then may proceed with Cephalosporin use.  Marland Kitchen Pentazocine Other (See Comments)  . Bextra [Valdecoxib] Hives  . Codeine Nausea And Vomiting  . Lactose Intolerance (Gi) Diarrhea  . Meperidine Hcl Nausea And Vomiting  . Morphine Nausea And Vomiting  . Pentazocine Lactate     Muscle spasms     Past Medical History:  Diagnosis Date  . Arthritis   . Bipolar 1 disorder ([place])   . CAD (coronary artery disease) 2007   CABG X 1  . Depression    controlled  . Hay fever   . Headache    Hx of migraines  . Heart murmur   . Hyperlipidemia   . Hypertension   . Idiopathic hypersomnia   . Mitral valve prolapse   . PONV (postoperative nausea and vomiting)   . Urinary incontinence     Blood pressure 136/72, pulse 74, temperature 98 F (36.7 C), height 5' (1.524 m), weight 112 lb 12.8 oz (51.2 kg), last menstrual period 01/10/1989.  Hypertension Patient with essential hypertension currently doing better but not to BP goal at this time.  Will have her increase her amlodipine to 10 mg daily and continue with home BP monitoring.  Also will have her move the amlodipine to mornings and continue with the valsartan in the evenings.  Patient is very aware of technique to check home readings and will continue regular monitoring.  She is due to see Dr. Claiborne Billings in 2 months.  We can see her after that should there be a need, and she can call the office sooner should she have concerns.       Tommy Medal PharmD CPP [facility] [address] [place], [place] [address] [phone]

## 2019-05-27 NOTE — Assessment & Plan Note (Signed)
Patient with essential hypertension currently doing better but not to BP goal at this time.  Will have her increase her amlodipine to 10 mg daily and continue with home BP monitoring.  Also will have her move the amlodipine to mornings and continue with the valsartan in the evenings.  Patient is very aware of technique to check home readings and will continue regular monitoring.  She is due to see Dr. Claiborne Billings in 2 months.  We can see her after that should there be a need, and she can call the office sooner should she have concerns.

## 2019-05-27 NOTE — Patient Instructions (Signed)
Return for a a follow up appointment with Dr. Claiborne Billings in July   Check your blood pressure at home daily and keep record of the readings.  Take your BP meds as follows:  Increase amlodipine to 10 mg - move this to taking in the mornings  Continue with valsartan 320 mg in the evenings  Bring all of your meds, your BP cuff and your record of home blood pressures to your next appointment.  Exercise as you're able, try to walk approximately 30 minutes per day.  Keep salt intake to a minimum, especially watch canned and prepared boxed foods.  Eat more fresh fruits and vegetables and fewer canned items.  Avoid eating in fast food restaurants.    HOW TO TAKE YOUR BLOOD PRESSURE: . Rest 5 minutes before taking your blood pressure. .  Don't smoke or drink caffeinated beverages for at least 30 minutes before. . Take your blood pressure before (not after) you eat. . Sit comfortably with your back supported and both feet on the floor (don't cross your legs). . Elevate your arm to heart level on a table or a desk. . Use the proper sized cuff. It should fit smoothly and snugly around your bare upper arm. There should be enough room to slip a fingertip under the cuff. The bottom edge of the cuff should be 1 inch above the crease of the elbow. . Ideally, take 3 measurements at one sitting and record the average.

## 2019-05-27 NOTE — Telephone Encounter (Signed)
Her palpitations can be contributed by anxiety, stress, as well as her dextroamphetamine.  If palpitations continue and are occurring very frequently can try low-dose metoprolol succinate initially at 12.5 mg to see if they can subside.

## 2019-05-28 NOTE — Telephone Encounter (Addendum)
LVM2CB 5/18, sent mychart message as well

## 2019-06-05 DIAGNOSIS — M25512 Pain in left shoulder: Secondary | ICD-10-CM | POA: Diagnosis not present

## 2019-06-12 ENCOUNTER — Telehealth: Payer: Self-pay | Admitting: Cardiovascular Disease

## 2019-06-12 NOTE — Telephone Encounter (Signed)
Pt c/o medication issue:  1. Name of Medication: amLODipine (NORVASC) 10 MG tablet  2. How are you currently taking this medication (dosage and times per day)? As directed  3. Are you having a reaction (difficulty breathing--STAT)? yes  4. What is your medication issue? Knees and ankles are swelling. States she would like to discuss this with Erasmo Downer.

## 2019-06-13 MED ORDER — AMLODIPINE BESYLATE 5 MG PO TABS: 5.0000 mg | ORAL_TABLET | Freq: Every day | ORAL | 3 refills | Status: DC

## 2019-06-13 MED ORDER — CHLORTHALIDONE 25 MG PO TABS: 25.0000 mg | ORAL_TABLET | Freq: Every day | ORAL | 3 refills | Status: DC

## 2019-06-13 NOTE — Telephone Encounter (Signed)
Returned call to patient - she has noticed an increase in LEE since increasing amlodipine to 10mg  dose.  States BP doing quite well, [id number] systolic mostly, although occasionally up to 140's.  Diastolic all in the [id number].    Will have her stop amlodipine for 3 days then resume with 5 mg daily.  Also will have her start chlorthalidone 12.5 mg daily.  Will need to repeat metabolic panel in 2 weeks.   Patient voiced understanding and is agreeable with plan.

## 2019-06-17 NOTE — Telephone Encounter (Signed)
Patient sent email:  generic dextroamphetamine sulfate 5mg  tablets/ 4 tablets by mouth daily (qty 120 tablets) My previous prescription for this medication was filled on 05/14/2019 . Thanks & kind regards, Brandy [name]  Dr. Halford Chessman please advise on refill

## 2019-06-18 ENCOUNTER — Other Ambulatory Visit: Payer: Self-pay | Admitting: Pulmonary Disease

## 2019-06-18 MED ORDER — DEXTROAMPHETAMINE SULFATE 5 MG PO TABS: 20.0000 mg | ORAL_TABLET | Freq: Every day | ORAL | 0 refills | Status: DC

## 2019-07-01 ENCOUNTER — Telehealth: Payer: Self-pay | Admitting: Cardiovascular Disease

## 2019-07-01 DIAGNOSIS — I10 Essential (primary) hypertension: Secondary | ICD-10-CM

## 2019-07-01 NOTE — Telephone Encounter (Signed)
Per chart review, patient started chlorthalidone on 6/2 per [facility]. Was supposed to have repeat BMET in 2 weeks - not in [place]

## 2019-07-01 NOTE — Telephone Encounter (Signed)
Patient is requesting to discuss whether or not she needs to continue taking chlorthalidone (HYGROTON) 25 MG tablet medication. Please call.

## 2019-07-05 NOTE — Telephone Encounter (Signed)
Spoke with pt, aware she will need to continue the medication and she will come for lab work next week.

## 2019-07-08 ENCOUNTER — Telehealth: Payer: Self-pay | Admitting: Family Medicine

## 2019-07-08 NOTE — Telephone Encounter (Signed)
Former Dr. Deborra Medina patient:  Called patient to see if she had another provider of if she wanted to establish care with one of our provider's here at our office. No answer, left a message to give the office a call back.

## 2019-07-18 DIAGNOSIS — I10 Essential (primary) hypertension: Secondary | ICD-10-CM | POA: Diagnosis not present

## 2019-07-18 LAB — BASIC METABOLIC PANEL
BUN/Creatinine Ratio: 36 — ABNORMAL HIGH (ref 12–28)
BUN: 40 mg/dL — ABNORMAL HIGH (ref 8–27)
CO2: 23 mmol/L (ref 20–29)
Calcium: 9.8 mg/dL (ref 8.7–10.3)
Chloride: 99 mmol/L (ref 96–106)
Creatinine, Ser: 1.12 mg/dL — ABNORMAL HIGH (ref 0.57–1.00)
GFR calc Af Amer: 57 mL/min/{1.73_m2} — ABNORMAL LOW (ref >59)
GFR calc non Af Amer: 50 mL/min/{1.73_m2} — ABNORMAL LOW (ref >59)
Glucose: 90 mg/dL (ref 65–99)
Potassium: 4.6 mmol/L (ref 3.5–5.2)
Sodium: 138 mmol/L (ref 134–144)

## 2019-07-24 ENCOUNTER — Encounter (INDEPENDENT_AMBULATORY_CARE_PROVIDER_SITE_OTHER): Payer: Self-pay

## 2019-07-25 ENCOUNTER — Other Ambulatory Visit: Payer: Self-pay | Admitting: Pulmonary Disease

## 2019-07-25 MED ORDER — DEXTROAMPHETAMINE SULFATE 5 MG PO TABS: 20.0000 mg | ORAL_TABLET | Freq: Every day | ORAL | 0 refills | Status: DC

## 2019-07-25 NOTE — Telephone Encounter (Signed)
Dr. Halford Chessman please advise on refill for Dextrostat.

## 2019-07-30 ENCOUNTER — Other Ambulatory Visit: Payer: Self-pay

## 2019-07-30 ENCOUNTER — Ambulatory Visit (INDEPENDENT_AMBULATORY_CARE_PROVIDER_SITE_OTHER): Payer: Medicare Other | Admitting: Cardiovascular Disease

## 2019-07-30 ENCOUNTER — Encounter: Payer: Self-pay | Admitting: Cardiovascular Disease

## 2019-07-30 DIAGNOSIS — I2510 Atherosclerotic heart disease of native coronary artery without angina pectoris: Secondary | ICD-10-CM | POA: Diagnosis not present

## 2019-07-30 DIAGNOSIS — E785 Hyperlipidemia, unspecified: Secondary | ICD-10-CM

## 2019-07-30 DIAGNOSIS — Z951 Presence of aortocoronary bypass graft: Secondary | ICD-10-CM | POA: Diagnosis not present

## 2019-07-30 DIAGNOSIS — G4711 Idiopathic hypersomnia with long sleep time: Secondary | ICD-10-CM

## 2019-07-30 DIAGNOSIS — I10 Essential (primary) hypertension: Secondary | ICD-10-CM | POA: Diagnosis not present

## 2019-07-30 DIAGNOSIS — R002 Palpitations: Secondary | ICD-10-CM

## 2019-07-30 DIAGNOSIS — I2583 Coronary atherosclerosis due to lipid rich plaque: Secondary | ICD-10-CM

## 2019-07-30 MED ORDER — CHLORTHALIDONE 25 MG PO TABS
12.5000 mg | ORAL_TABLET | Freq: Every day | ORAL | 3 refills | Status: DC
Start: 2019-07-30 — End: 2020-08-31

## 2019-07-30 NOTE — Progress Notes (Signed)
Is patient ID: Brandy [name], female   DOB: Dec 20, 1950, 69 y.o.   MRN: [id number]             HPI: Brandy [name], is a 69 y.o. female who presents to the office for a 3 month follow-up cardiology evaluation.  In 2007 Brandy [name] was not having significant symptoms but because of a family history for peripheral vascular disease, abdominal aortic aneurysm, and somewhat labile blood pressure a Cardiolite study was done which suggested anterior wall ischemia. Subsequent cardiac catheterization revealing 95% ostial LAD stenosis. She underwent off pump LIMA to LAD bypass surgery by Dr. Roxan Hockey on 03/21/2005.  A echo Doppler study in April 2011 revealed an ejection fraction greater than 55%. There was grade 1 diastolic dysfunction. She had mild MR and nuclear perfusion scan was normal.  I had not seen her in over 3 years and she had stopped taking medication for blood pressure.  She recently saw Dr. Milta Deiters and her blood pressure was significantly elevated at 169/ 100.  She resumed taking Diovan and recently has been taking this at 160 mg daily.  She has a history of hyperlipidemia and has been taking Crestor 20 mg.  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 Charlott Holler of psychiatry and has been taking Prozac 20 mg.  She has developed left hip discomfort and needs surgery.  She also has a history of idiopathic hypersomnolence, and has been taking Provigil 200 mg daily for this.  When I saw her in 2017, her exam revealed a 2/6 systolic murmur along the left sternal border.  She was in need for left hip replacement surgery. I recommended that she undergo a [place] study for preoperative assessment and this was done on 12/08/2015 which revealed normal perfusion and function.  A 2-D echo Doppler study which was done on 01/14/2016 showed an EF of 65-70%.  There was mild grade 1 diastolic dysfunction.  She had mitral annular calcification with trivial MR.  There was mild TR.  There was  no evidence for aortic stenosis.  PA pressure was normal.  I saw her in follow-up in January 2018, and she was given clearance for surgery.  I last saw her in March 2019 and over the previous  14 months, she denied any episodes of chest pain, PND, orthopnea, palpitations, presyncope or syncope.  She has had some blood pressure variation.  Her valsartan was switched irbesartan due to the generic impurities.  Irbesartan was further increased to 300 mg due to recurrent blood pressure elevation  She had been on rosuvastatin, but stopped taking this several months ago due to myalgias.  During that evaluation her blood pressure was elevated and I added amlodipine 2.5 mg with target LDL less than 130.  She had stopped taking her rosuvastatin for hyperlipidemia.  She also was taking Provigil for idiopathic hypersomnia.  I have not seen her over the past 2 years.  I saw her on April 29, 2019 for her recent evaluation.  She has had issues with depression and sees Dr. Charlott Holler of psychiatry.  She has continued to be on Prozac 20 mg daily.  Remotely she was felt to be bipolar but is not felt to have this diagnosis any longer.  Apparently she became intolerant to Provigil and now has been on dextroamphetamine for her idiopathic hypersomnia.  Her MSLT test in April 2009 showed mean sleep latency at 4 minutes with 5 out of 5 naps and 0 naps with sleep  onset REM.  On her more recent dose of 15 mg dextroamphetamine she was still having difficulty with significant daytime sleepiness and was last evaluated by Dr. Halford Chessman in a telemedicine visit it was recommended she increase her dose and as such she has been taking 15 mg in the morning and 5 mg  afternoon.  She had noted some increased palpitations on her increased extra amphetamine.  She denied any significant chest pain.  She does yard work as needed.  She still felt that she needs to take daytime naps.    During her April 2020 evaluation, her blood pressure was elevated at  172/80.  She was taking valsartan 320 mg daily.  I recommended the addition of amlodipine 5 mg for optimal blood pressure control and discussed with her new hypertensive guidelines.  We also discussed the potential contribution of her dextroamphetamine.  I recommended that she see Joslyn Hy in our pharmacy clinic and she was evaluated by her on May 27, 2019 for follow-up evaluation.  At that time her blood pressure was still elevated and Kristen further recommended titration of amlodipine to 10 mg.    Brandy [name] developed significant leg edema on the increased amlodipine dose and this was discontinued and subsequently changed to chlorthalidone 25 mg.  Follow-up laboratory has shown an increase in her BUN and creatinine from 18/0.7 up to 40/1.12 since initiating chlorthalidone.  She no longer has leg edema.  She admits to some fatigue.  She denies chest pain.  She presents for reevaluation.  Past Medical History:  Diagnosis Date  . Arthritis   . Bipolar 1 disorder ([place])   . CAD (coronary artery disease) 2007   CABG X 1  . Depression    controlled  . Hay fever   . Headache    Hx of migraines  . Heart murmur   . Hyperlipidemia   . Hypertension   . Idiopathic hypersomnia   . Mitral valve prolapse   . PONV (postoperative nausea and vomiting)   . Urinary incontinence     Past Surgical History:  Procedure Laterality Date  . cataract surgery     BIL  . CORONARY ARTERY BYPASS GRAFT  03/21/05   off pump LIMA-LAD  . myomectomy  1981  . reconstructive surgery  1945   lip   . [place]   3 times  . SHOULDER ARTHROSCOPY  2006  . TOTAL ABDOMINAL HYSTERECTOMY  1991  . TOTAL HIP ARTHROPLASTY Left 03/01/2016   Procedure: LEFT TOTAL HIP ARTHROPLASTY ANTERIOR APPROACH;  Surgeon: Paralee Cancel, MD;  Location: [facility];  Service: Orthopedics;  Laterality: Left;  Requests 70 mins  . trach    . VESICOVAGINAL FISTULA CLOSURE W/ TAH      Allergies  Allergen Reactions  .  Penicillins Anaphylaxis    Has patient had a PCN reaction causing immediate rash, facial/tongue/throat swelling, SOB or lightheadedness with hypotension: Yes Has patient had a PCN reaction causing severe rash involving mucus membranes or skin necrosis: No Has patient had a PCN reaction that required hospitalization Yes Has patient had a PCN reaction occurring within the last 10 years: No If all of the above answers are "NO", then may proceed with Cephalosporin use.  Marland Kitchen Pentazocine Other (See Comments)  . Bextra [Valdecoxib] Hives  . Codeine Nausea And Vomiting  . Lactose Intolerance (Gi) Diarrhea  . Meperidine Hcl Nausea And Vomiting  . Morphine Nausea And Vomiting  . Pentazocine Lactate     Muscle spasms  Current Outpatient Medications  Medication Sig Dispense Refill  . chlorthalidone (HYGROTON) 25 MG tablet Take 0.5 tablets (12.5 mg total) by mouth daily. 45 tablet 3  . Cholecalciferol (VITAMIN D3) 125 MCG (5000 UT) CAPS Take 1 capsule by mouth daily.    Marland Kitchen dextroamphetamine (DEXTROSTAT) 5 MG tablet Take 4 tablets (20 mg total) by mouth daily. 120 tablet 0  . famotidine (PEPCID) 20 MG tablet Take 20 mg by mouth daily.    Marland Kitchen FLUoxetine (PROZAC) 20 MG capsule Take 1 capsule (20 mg total) by mouth daily. 90 capsule 3  . Multiple Vitamins-Minerals (MULTIPLE VITAMINS/WOMENS) tablet Take 1 tablet by mouth daily.    . Naproxen Sodium (ALEVE PO) Take by mouth.    . rosuvastatin (CRESTOR) 40 MG tablet Take 1 tablet (40 mg total) by mouth daily. 90 tablet 3  . valsartan (DIOVAN) 320 MG tablet Take 1 tablet (320 mg total) by mouth daily. 90 tablet 3   Current Facility-Administered Medications  Medication Dose Route Frequency Provider Last Rate Last Admin  . ipratropium-albuterol (DUONEB) 0.5-2.5 (3) MG/3ML nebulizer solution 3 mL  3 mL Nebulization Once Shirline Frees, NP        Social History   Socioeconomic History  . Marital status: Divorced    Spouse name: Not on file  . Number of  children: Not on file  . Years of education: Not on file  . Highest education level: Not on file  Occupational History  . Occupation: retired  Tobacco Use  . Smoking status: Never Smoker  . Smokeless tobacco: Never Used  Vaping Use  . Vaping Use: Never used  Substance and Sexual Activity  . Alcohol use: No  . Drug use: No  . Sexual activity: Yes  Other Topics Concern  . Not on file  Social History Narrative  . Not on file   Social Determinants of Health   Financial Resource Strain:   . Difficulty of Paying Living Expenses:   Food Insecurity:   . Worried About Programme researcher, broadcasting/film/video in the Last Year:   . Barista in the Last Year:   Transportation Needs:   . Freight forwarder (Medical):   Marland Kitchen Lack of Transportation (Non-Medical):   Physical Activity:   . Days of Exercise per Week:   . Minutes of Exercise per Session:   Stress:   . Feeling of Stress :   Social Connections:   . Frequency of Communication with Friends and Family:   . Frequency of Social Gatherings with Friends and Family:   . Attends Religious Services:   . Active Member of Clubs or Organizations:   . Attends Banker Meetings:   Marland Kitchen Marital Status:   Intimate Partner Violence:   . Fear of Current or Ex-Partner:   . Emotionally Abused:   Marland Kitchen Physically Abused:   . Sexually Abused:     Family History  Problem Relation Age of Onset  . Stroke Mother        died 42  . Aneurysm Mother   . Hypertension Mother   . Kidney disease Mother   . Bipolar disorder Mother   . Lung cancer Father        died 34  . Bipolar disorder Other   . Hypertension Other   . Osteoporosis Maternal Grandmother   . Arthritis Maternal Grandmother   . Alcohol abuse Maternal Grandfather    Socially she is divorced for >25 years. She is retired from Agricultural engineer since  2013.  She does not have any children. There is no tobacco use. No alcohol use. She does exercise at least 3 days per  week  ROS General: Negative; No fevers, chills, or night sweats;  HEENT: Negative; No changes in vision or hearing, sinus congestion, difficulty swallowing Pulmonary: Negative; No cough, wheezing, shortness of breath, hemoptysis Cardiovascular: see HPI GI: Negative; No nausea, vomiting, diarrhea, or abdominal pain GU: Negative; No dysuria, hematuria, or difficulty voiding Musculoskeletal: Positive for left hip degeneration Hematologic/Oncology: Negative; no easy bruising, bleeding Endocrine: Negative; no heat/cold intolerance; no diabetes Neuro: Negative; no changes in balance, headaches Skin: Negative; No rashes or skin lesions Psychiatric: No longer diagnosed with bipolar disorder.  She continues to have episodic depression. Sleep: Positive for idiopathic hypersomnia with 5 out of 5 naps on and MSLT evaluation and 0 SOREM; no narcolepsy or cataplectic events.  No sleep apnea with PSG AHI 0.5   Other comprehensive 14 point system review is negative.   PE BP 114/64 (BP Location: Right Arm, Patient Position: Sitting, Cuff Size: Normal)   Pulse 66   Ht 5' (1.524 m)   Wt 111 lb 12.8 oz (50.7 kg)   LMP 01/10/1989 (Approximate)   BMI 21.83 kg/m    Repeat blood pressure by me was 124/64 supine 120/60 standing  Wt Readings from Last 3 Encounters:  07/30/19 111 lb 12.8 oz (50.7 kg)  05/27/19 112 lb 12.8 oz (51.2 kg)  04/29/19 112 lb 12.8 oz (51.2 kg)   General: Alert, oriented, no distress.  Skin: normal turgor, no rashes, warm and dry HEENT: Normocephalic, atraumatic. Pupils equal round and reactive to light; sclera anicteric; extraocular muscles intact; Nose without nasal septal hypertrophy Mouth/Parynx benign; Mallinpatti scale  Neck: No JVD, no carotid bruits; normal carotid upstroke Lungs: clear to ausculatation and percussion; no wheezing or rales Chest wall: without tenderness to palpitation Heart: PMI not displaced, RRR, s1 s2 normal, 4-2/3 systolic murmur, no diastolic  murmur, no rubs, gallops, thrills, or heaves Abdomen: soft, nontender; no hepatosplenomehaly, BS+; abdominal aorta nontender and not dilated by palpation. Back: no CVA tenderness Pulses 2+ Musculoskeletal: full range of motion, normal strength, no joint deformities Extremities: no clubbing cyanosis or edema, Homan's sign negative  Neurologic: grossly nonfocal; Cranial nerves grossly wnl Psychologic: Normal mood and affect   ECG (independently read by me): NSR 66; posibie LAE  April 29, 2019 ECG (independently read by me): NSR at 71; possible LAE; no ectopy; normal intervals  March 2019 ECG (independently read by me): Sinus rhythm at 62  bpm.  Normal intervals.  No ST segment changes.  No ectopy.  January 2018 ECG (independently read by me): Normal sinus rhythm at 75 bpm.  No ST segment changes.  QTc interval normal at 422 Brandy.  No ectopy.  November 2017 ECG (independently read by me): Normal sinus rhythm at 67 bpm.  Normal intervals.  No significant ST-T changes.  November 2014 ECG: Sinus rhythm at 64 beats per minute. QTc interval 412 Brandy. Agnostic Q-wave in lead III and F  LABS:  BMP Latest Ref Rng & Units 07/18/2019 04/29/2019 09/20/2016  Glucose 65 - 99 mg/dL 90 83 80  BUN 8 - 27 mg/dL 40(H) 18 29(H)  Creatinine 0.57 - 1.00 mg/dL 1.12(H) 0.70 0.73  BUN/Creat Ratio 12 - 28 36(H) 26 -  Sodium 134 - 144 mmol/L 138 142 143  Potassium 3.5 - 5.2 mmol/L 4.6 4.9 4.2  Chloride 96 - 106 mmol/L 99 104 102  CO2 20 - 29  mmol/L _0 Calcium 8.7 - 10.3 mg/dL 9.8 9.4 10.1   Hepatic Function Latest Ref Rng & Units 04/29/2019 09/20/2016 08/11/2015  Total Protein 6.0 - 8.5 g/dL 6.4 6.8 6.4  Albumin 3.8 - 4.8 g/dL 4.2 4.6 4.2  AST 0 - 40 IU/L _1 ALT 0 - 32 IU/L _2 Alk Phosphatase 39 - 117 IU/L 131(H) 104 91  Total Bilirubin 0.0 - 1.2 mg/dL 0.5 0.5 0.5   CBC Latest Ref Rng & Units 04/29/2019 09/20/2016 03/02/2016  WBC 3.4 - 10.8 x10E3/uL 5.2 8.9 10.7(H)  Hemoglobin 11.1 - 15.9 g/dL  14.1 14.3 9.6(L)  Hematocrit 34.0 - 46.6 % 42.1 43.5 30.3(L)  Platelets 150 - 450 x10E3/uL 269 319.0 215   Lab Results  Component Value Date   MCV 87 04/29/2019   MCV 92.3 09/20/2016   MCV 89.1 03/02/2016   Lab Results  Component Value Date   TSH 2.680 04/29/2019   Lipid Panel     Component Value Date/Time   CHOL 261 (H) 04/29/2019 0908   TRIG 112 04/29/2019 0908   HDL 65 04/29/2019 0908   CHOLHDL 4.0 04/29/2019 0908   CHOLHDL 2 09/20/2016 1644   VLDL 30.0 09/20/2016 1644   LDLCALC 176 (H) 04/29/2019 0908    RADIOLOGY: No results found.  IMPRESSION:  1. Essential hypertension   2. Coronary artery disease due to lipid rich plaque   3. Hx of CABG   4. Palpitations   5. Hyperlipidemia with target LDL less than 70   6. Idiopathic hypersomnia     ASSESSMENT AND PLAN: Brandy [name] is a 69 year-old Caucasian female who underwent CABG revascularization surgery in 2007 after cardiac catheterization revealed 95% ostial LAD stenosis encroaching upon the left main. She underwent off-pump LIMA to LAD bypass. Prior to her surgery, she was completely asymptomatic and anterior ischemia was noted routinely on Cardiolite imaging.  Her last nuclear perfusion study in November 2017 continued to show normal perfusion.  She has normal to hyperdynamic ejection fraction and mitral annular calcification and there was no evidence for aortic stenosis.  There was mild MR and TR. he denies any instability now 14 years status post CABG revascularization surgery.  She has a history of hypertension and blood pressure today is significantly elevated despite taking valsartan 320 mg daily.  After not having seen her in over 2 years, when she was evaluated in April 2021 she was significantly hypertensive and amlodipine 5 mg was added to her valsartan 320 mg.  Apparently this was further titrated to 10 mg when seen by our Pharm.D. for follow-up evaluation but she developed significant edema leading to ultimate  discontinuance of amlodipine and transition to chlorthalidone 25 mg.  Her blood work suggest prerenal azotemia with an increased BUN at 40.  She denies any change in stool.  I have recommended she reduce her chlorthalidone to 12.5 mg.  She has also been on rosuvastatin 40 mg daily since her evaluation in April 2021.  Laboratory at that time had shown a total cholesterol 261 LDL cholesterol 176.  In 2 weeks she will undergo comprehensive metabolic panel and fasting lipid panel.  I will see her in 2 to 3 months for follow-up evaluation.  Troy Sine, MD, [facility]  07/31/2019 11:07 AM

## 2019-07-30 NOTE — Patient Instructions (Signed)
Medication Instructions:  DECREASE YOUR CHLORTHALIDONE TO 12.5MG  DAILY (1/2 TAB)  *If you need a refill on your cardiac medications before your next appointment, please call your pharmacy*   Lab Work: REPEAT CMET AND LIPID IN 2 WEEKS-FASTING If you have labs (blood work) drawn today and your tests are completely normal, you will receive your results only by: Marland Kitchen MyChart Message (if you have MyChart) OR . A paper copy in the mail If you have any lab test that is abnormal or we need to change your treatment, we will call you to review the results.     Follow-Up: At [facility], you and your health needs are our priority.  As part of our continuing mission to provide you with exceptional heart care, we have created designated Provider Care Teams.  These Care Teams include your primary Cardiologist (physician) and Advanced Practice Providers (APPs -  Physician Assistants and Nurse Practitioners) who all work together to provide you with the care you need, when you need it.  We recommend signing up for the patient portal called "MyChart".  Sign up information is provided on this After Visit Summary.  MyChart is used to connect with patients for Virtual Visits (Telemedicine).  Patients are able to view lab/test results, encounter notes, upcoming appointments, etc.  Non-urgent messages can be sent to your provider as well.   To learn more about what you can do with MyChart, go to [email or web address].    Your next appointment:   3 month(s)  The format for your next appointment:   In Person  Provider:   Shelva Majestic, MD

## 2019-07-31 ENCOUNTER — Encounter: Payer: Self-pay | Admitting: Cardiovascular Disease

## 2019-08-30 NOTE — Telephone Encounter (Signed)
Refill request for Dextrostat, 20 mg per day, 5 mg tablets, last video visit 02/21/19, last refill 07/25/19.

## 2019-08-31 MED ORDER — DEXTROAMPHETAMINE SULFATE 5 MG PO TABS: 20.0000 mg | ORAL_TABLET | Freq: Every day | ORAL | 0 refills | Status: DC

## 2019-09-27 ENCOUNTER — Encounter: Payer: Self-pay | Admitting: Family Medicine

## 2019-10-04 ENCOUNTER — Other Ambulatory Visit: Payer: Self-pay | Admitting: Pulmonary Disease

## 2019-10-04 MED ORDER — DEXTROAMPHETAMINE SULFATE 5 MG PO TABS: 20.0000 mg | ORAL_TABLET | Freq: Every day | ORAL | 0 refills | Status: DC

## 2019-10-04 NOTE — Telephone Encounter (Signed)
Please advise on email:  Please send an escript for a 30-day supply of the following medication to the [facility] on [address]:  Generic Dextroamphetamine Sulfate  5mg  tablets/4 tablets daily Quantity--120 tablets  My previous escript for this medication was filled on 08/31/2019 (34 days ago).  Thanks & kind regardsCindia, Hustead  DOB [birth year] Cell [phone]

## 2019-10-06 ENCOUNTER — Encounter: Payer: Self-pay | Admitting: Family Medicine

## 2019-10-07 DIAGNOSIS — Z23 Encounter for immunization: Secondary | ICD-10-CM | POA: Diagnosis not present

## 2019-10-09 ENCOUNTER — Encounter: Payer: Self-pay | Admitting: Family Medicine

## 2019-10-09 ENCOUNTER — Other Ambulatory Visit: Payer: Self-pay

## 2019-10-09 ENCOUNTER — Ambulatory Visit (INDEPENDENT_AMBULATORY_CARE_PROVIDER_SITE_OTHER): Payer: Medicare Other | Admitting: Family Medicine

## 2019-10-09 VITALS — BP 122/64 | HR 79 | Temp 98.6°F | Ht 60.0 in | Wt 111.6 lb

## 2019-10-09 DIAGNOSIS — B351 Tinea unguium: Secondary | ICD-10-CM

## 2019-10-09 DIAGNOSIS — Z23 Encounter for immunization: Secondary | ICD-10-CM | POA: Diagnosis not present

## 2019-10-09 DIAGNOSIS — E7849 Other hyperlipidemia: Secondary | ICD-10-CM | POA: Diagnosis not present

## 2019-10-09 DIAGNOSIS — I10 Essential (primary) hypertension: Secondary | ICD-10-CM

## 2019-10-09 MED ORDER — TERBINAFINE HCL 250 MG PO TABS: 250.0000 mg | ORAL_TABLET | Freq: Every day | ORAL | 0 refills | Status: DC

## 2019-10-09 NOTE — Progress Notes (Signed)
Brandy [name] is a 69 y.o. female  Chief Complaint  Patient presents with  . Establish Care    TOC- needs pneumonia vaccine and flu. just got covid booster on 10/07/19. no concerns    HPI: Brandy [name] is a 69 y.o. female here for [facility] appt, previous PCP Dr. Deborra Medina. She would like pneumonia and flu vaccines today.  Pt had covid boster on Monday.  She is planning to get MMR vaccine at local pharmacy since she never had MMR vaccine or did not have mumps as a child. She has a h/o HTN, hyperlipidemia - UTD on labs.   Past Medical History:  Diagnosis Date  . Arthritis   . Bipolar 1 disorder ([place])   . CAD (coronary artery disease) 2007   CABG X 1  . Depression    controlled  . Hay fever   . Headache    Hx of migraines  . Heart murmur   . Hyperlipidemia   . Hypertension   . Idiopathic hypersomnia   . Mitral valve prolapse   . PONV (postoperative nausea and vomiting)   . Urinary incontinence     Past Surgical History:  Procedure Laterality Date  . cataract surgery     BIL  . CORONARY ARTERY BYPASS GRAFT  03/21/05   off pump LIMA-LAD  . myomectomy  1981  . reconstructive surgery  1945   lip   . [place]   3 times  . SHOULDER ARTHROSCOPY  2006  . TOTAL ABDOMINAL HYSTERECTOMY  1991  . TOTAL HIP ARTHROPLASTY Left 03/01/2016   Procedure: LEFT TOTAL HIP ARTHROPLASTY ANTERIOR APPROACH;  Surgeon: Paralee Cancel, MD;  Location: [facility];  Service: Orthopedics;  Laterality: Left;  Requests 70 mins  . trach    . VESICOVAGINAL FISTULA CLOSURE W/ TAH      Social History   Socioeconomic History  . Marital status: Divorced    Spouse name: Not on file  . Number of children: Not on file  . Years of education: Not on file  . Highest education level: Not on file  Occupational History  . Occupation: retired  Tobacco Use  . Smoking status: Never Smoker  . Smokeless tobacco: Never Used  Vaping Use  . Vaping Use: Never used  Substance and Sexual Activity  .  Alcohol use: No  . Drug use: No  . Sexual activity: Yes  Other Topics Concern  . Not on file  Social History Narrative  . Not on file   Social Determinants of Health   Financial Resource Strain:   . Difficulty of Paying Living Expenses: Not on file  Food Insecurity:   . Worried About Charity fundraiser in the Last Year: Not on file  . Ran Out of Food in the Last Year: Not on file  Transportation Needs:   . Lack of Transportation (Medical): Not on file  . Lack of Transportation (Non-Medical): Not on file  Physical Activity:   . Days of Exercise per Week: Not on file  . Minutes of Exercise per Session: Not on file  Stress:   . Feeling of Stress : Not on file  Social Connections:   . Frequency of Communication with Friends and Family: Not on file  . Frequency of Social Gatherings with Friends and Family: Not on file  . Attends Religious Services: Not on file  . Active Member of Clubs or Organizations: Not on file  . Attends Archivist Meetings: Not  on file  . Marital Status: Not on file  Intimate Partner Violence:   . Fear of Current or Ex-Partner: Not on file  . Emotionally Abused: Not on file  . Physically Abused: Not on file  . Sexually Abused: Not on file    Family History  Problem Relation Age of Onset  . Stroke Mother        died 56  . Aneurysm Mother   . Hypertension Mother   . Kidney disease Mother   . Bipolar disorder Mother   . Lung cancer Father        died 23  . Bipolar disorder Other   . Hypertension Other   . Osteoporosis Maternal Grandmother   . Arthritis Maternal Grandmother   . Alcohol abuse Maternal Grandfather      Immunization History  Administered Date(s) Administered  . Fluad Quad(high Dose 65+) 09/26/2018  . Influenza Split 10/08/2016  . Influenza Whole 10/10/2009  . Influenza, High Dose Seasonal PF 11/09/2017  . Influenza-Unspecified 11/11/2010  . PFIZER SARS-COV-2 Vaccination 02/15/2019, 03/12/2019, 10/07/2019  .  Pneumococcal Conjugate-13 11/09/2017  . Tdap 07/25/2018  . Varicella 07/25/2018, 08/23/2018    Outpatient Encounter Medications as of 10/09/2019  Medication Sig  . Acetaminophen (TYLENOL ARTHRITIS PAIN PO) Take by mouth.  . chlorthalidone (HYGROTON) 25 MG tablet Take 0.5 tablets (12.5 mg total) by mouth daily.  . Cholecalciferol (VITAMIN D3) 125 MCG (5000 UT) CAPS Take 1 capsule by mouth daily.  Marland Kitchen dextroamphetamine (DEXTROSTAT) 5 MG tablet Take 4 tablets (20 mg total) by mouth daily.  . famotidine (PEPCID) 20 MG tablet Take 20 mg by mouth daily.  Marland Kitchen FLUoxetine (PROZAC) 20 MG capsule Take 1 capsule (20 mg total) by mouth daily.  . Multiple Vitamins-Minerals (MULTIPLE VITAMINS/WOMENS) tablet Take 1 tablet by mouth daily.  . Naproxen Sodium (ALEVE PO) Take by mouth.  . valsartan (DIOVAN) 320 MG tablet Take 1 tablet (320 mg total) by mouth daily.  . rosuvastatin (CRESTOR) 40 MG tablet Take 1 tablet (40 mg total) by mouth daily.   Facility-Administered Encounter Medications as of 10/09/2019  Medication  . ipratropium-albuterol (DUONEB) 0.5-2.5 (3) MG/3ML nebulizer solution 3 mL     ROS: Gen: no fever, chills  Skin: no rash, itching ENT: no ear pain, ear drainage, nasal congestion, rhinorrhea, sinus pressure, sore throat Eyes: no blurry vision, double vision Resp: no cough, wheeze,SOB CV: no CP, palpitations, LE edema,  GI: no heartburn, n/v/d/c, abd pain GU: no dysuria, urgency, frequency, hematuria  MSK: no joint pain, myalgias, back pain Neuro: no dizziness, headache, weakness, vertigo Psych: no depression, anxiety, insomnia   Allergies  Allergen Reactions  . Penicillins Anaphylaxis    Has patient had a PCN reaction causing immediate rash, facial/tongue/throat swelling, SOB or lightheadedness with hypotension: Yes Has patient had a PCN reaction causing severe rash involving mucus membranes or skin necrosis: No Has patient had a PCN reaction that required hospitalization Yes Has  patient had a PCN reaction occurring within the last 10 years: No If all of the above answers are "NO", then may proceed with Cephalosporin use.  Marland Kitchen Pentazocine Other (See Comments)  . Bextra [Valdecoxib] Hives  . Codeine Nausea And Vomiting  . Lactose Intolerance (Gi) Diarrhea  . Meperidine Hcl Nausea And Vomiting  . Morphine Nausea And Vomiting  . Pentazocine Lactate     Muscle spasms     BP 122/64   Pulse 79   Temp 98.6 F (37 C) (Temporal)   Ht 5' (1.524 m)  Wt 111 lb 9.6 oz (50.6 kg)   LMP 01/10/1989 (Approximate)   SpO2 98%   BMI 21.80 kg/m   Physical Exam Constitutional:      General: She is not in acute distress.    Appearance: Normal appearance. She is not ill-appearing.  Pulmonary:     Effort: No respiratory distress.  Neurological:     Mental Status: She is alert and oriented to person, place, and time.  Psychiatric:        Mood and Affect: Mood normal.        Behavior: Behavior normal.   Lt great toe: discolored, slightly thickened  A/P:  1. Need for pneumococcal vaccination - Pneumococcal polysaccharide vaccine 23-valent greater than or equal to 2yo subcutaneous/IM  2. Need for influenza vaccination - Flu vaccine HIGH DOSE PF (Fluzone High dose)  3. Other hyperlipidemia - cont crestor 78m daily - follows with cardio Dr. KClaiborne Billings- FLP in 07/2019 and pt states she is due for repeat FLP but is not fasting today  4. Essential hypertension - cont diovan 3237m chlorthalidone 2538maily - controlled, at goal  5. Onychomycosis of toenail Rx: - terbinafine (LAMISIL) 250 MG tablet; Take 1 tablet (250 mg total) by mouth daily.  Dispense: 90 tablet; Refill: 0 Discussed plan and reviewed medications with patient, including risks, benefits, and potential side effects. Pt expressed understand. All questions answered.  This visit occurred during the SARS-CoV-2 public health emergency.  Safety protocols were in place, including screening questions prior to the  visit, additional usage of staff PPE, and extensive cleaning of exam room while observing appropriate contact time as indicated for disinfecting solutions.

## 2019-10-14 ENCOUNTER — Other Ambulatory Visit: Payer: Self-pay

## 2019-10-14 ENCOUNTER — Telehealth: Payer: Self-pay | Admitting: Psychiatry

## 2019-10-14 DIAGNOSIS — F39 Unspecified mood [affective] disorder: Secondary | ICD-10-CM

## 2019-10-14 MED ORDER — FLUOXETINE HCL 20 MG PO CAPS: 20.0000 mg | ORAL_CAPSULE | Freq: Every day | ORAL | 0 refills | Status: DC

## 2019-10-14 NOTE — Telephone Encounter (Signed)
Pt called requesting refill for Prozac @ [facility]. Last apt 08/28/2018. Over due since May. LM on Pt VM to schedule f/u. Apt on 10/11 Pt must confirm apt

## 2019-10-14 NOTE — Telephone Encounter (Signed)
Rx sent 

## 2019-10-14 NOTE — Telephone Encounter (Signed)
30 day okay to send?

## 2019-10-14 NOTE — Telephone Encounter (Signed)
Yes, 30 day thanks

## 2019-10-21 ENCOUNTER — Other Ambulatory Visit: Payer: Self-pay | Admitting: Psychiatry

## 2019-10-21 ENCOUNTER — Telehealth: Payer: Self-pay | Admitting: Family Medicine

## 2019-10-21 ENCOUNTER — Other Ambulatory Visit: Payer: Self-pay

## 2019-10-21 ENCOUNTER — Ambulatory Visit (INDEPENDENT_AMBULATORY_CARE_PROVIDER_SITE_OTHER): Payer: Medicare Other | Admitting: Psychiatry

## 2019-10-21 ENCOUNTER — Encounter: Payer: Self-pay | Admitting: Psychiatry

## 2019-10-21 VITALS — BP 142/68 | HR 67

## 2019-10-21 DIAGNOSIS — F39 Unspecified mood [affective] disorder: Secondary | ICD-10-CM

## 2019-10-21 DIAGNOSIS — I2583 Coronary atherosclerosis due to lipid rich plaque: Secondary | ICD-10-CM

## 2019-10-21 DIAGNOSIS — G4710 Hypersomnia, unspecified: Secondary | ICD-10-CM

## 2019-10-21 DIAGNOSIS — I2510 Atherosclerotic heart disease of native coronary artery without angina pectoris: Secondary | ICD-10-CM

## 2019-10-21 MED ORDER — FLUOXETINE HCL 20 MG PO CAPS: 20.0000 mg | ORAL_CAPSULE | Freq: Every day | ORAL | 3 refills | Status: DC

## 2019-10-21 NOTE — Telephone Encounter (Signed)
Left message for patient to schedule Annual Wellness Visit.  Please schedule with Nurse Health Advisor Martha Stanley, RN at [facility] [place]  °

## 2019-10-21 NOTE — Progress Notes (Signed)
Brandy [name] [id number] 25-Jun-1950 69 y.o.  Subjective:   Patient ID:  Brandy [name] is a 69 y.o. (DOB 06/23/1950) female.  Chief Complaint:  Chief Complaint  Patient presents with  . Follow-up    Medication Management  . Other    Mood disorder    Medication Refill Associated symptoms include arthralgias. Pertinent negatives include no weakness.   Brandy [name] presents to the office today for follow-up of mood and above.  seen  10/2018.  Meds were not changed.  She remained on fluoxetine 20 mg daily.  10/21/19 appt with the following noted: Vaccinated.  Overall pretty good.  Minor depressive episodes which are brief.  Managing the stress.  Nothing extraordinary.  CC is sleepiness.  Saw neurologist recently and no med change.  Has to nap.  Tolerance to the stimulant.  No mood swings. Physical activity helps.  Hyper somnolence a big problem and sleep doc rx dextroamphetamin.  Has therapeutic naps.   But it still helps.  Didn't make her hyperverbal and hyperactive like in the past.  Has been very careful with the dosage. Overall better sleep cycle.  No mania since here.  Patient reports stable mood and denies irritable moods. No extremes of depression.   Patient denies any recent difficulty with anxiety.   Denies appetite disturbance.  Patient reports that energy and motivation have been better.  Patient denies any difficulty with concentration.  Patient denies any suicidal ideation. No history of cataplexy.  No new concerns.    Retired 2013.  Works on being active.  Exercises.  Past psych meds:  Fluoxetine,  Lamotrigine 600, Latuda, lithium, duloxetine, Abilify 5, Depakote 1000,  Seroquel sedation, Wellbutrin, Provigil, Celexa 40, Cerefolin NAC,  Trintellix, Trintellix, Viibryd 40, duloxetine, various stimulants,  Restoril  Review of Systems:  Review of Systems  Cardiovascular: Negative for palpitations.  Musculoskeletal: Positive for arthralgias.   Neurological: Negative for tremors and weakness.  Psychiatric/Behavioral: Positive for sleep disturbance. Negative for agitation, behavioral problems, confusion, decreased concentration, dysphoric mood, hallucinations, self-injury and suicidal ideas. The patient is not nervous/anxious and is not hyperactive.     Medications: I have reviewed the patient's current medications.  Current Outpatient Medications  Medication Sig Dispense Refill  . Acetaminophen (TYLENOL ARTHRITIS PAIN PO) Take by mouth.    . chlorthalidone (HYGROTON) 25 MG tablet Take 0.5 tablets (12.5 mg total) by mouth daily. 45 tablet 3  . Cholecalciferol (VITAMIN D3) 125 MCG (5000 UT) CAPS Take 1 capsule by mouth daily.    Marland Kitchen dextroamphetamine (DEXTROSTAT) 5 MG tablet Take 4 tablets (20 mg total) by mouth daily. 120 tablet 0  . famotidine (PEPCID) 20 MG tablet Take 20 mg by mouth daily.    Marland Kitchen FLUoxetine (PROZAC) 20 MG capsule Take 1 capsule (20 mg total) by mouth daily. 90 capsule 3  . Multiple Vitamins-Minerals (MULTIPLE VITAMINS/WOMENS) tablet Take 1 tablet by mouth daily.    . Naproxen Sodium (ALEVE PO) Take by mouth.    . rosuvastatin (CRESTOR) 40 MG tablet Take 1 tablet (40 mg total) by mouth daily. 90 tablet 3  . terbinafine (LAMISIL) 250 MG tablet Take 1 tablet (250 mg total) by mouth daily. 90 tablet 0  . valsartan (DIOVAN) 320 MG tablet Take 1 tablet (320 mg total) by mouth daily. 90 tablet 3   Current Facility-Administered Medications  Medication Dose Route Frequency Provider Last Rate Last Admin  . ipratropium-albuterol (DUONEB) 0.5-2.5 (3) MG/3ML nebulizer solution 3 mL  3 mL Nebulization Once Nafziger, Tommi Rumps,  NP        Medication Side Effects: None  Allergies:  Allergies  Allergen Reactions  . Penicillins Anaphylaxis    Has patient had a PCN reaction causing immediate rash, facial/tongue/throat swelling, SOB or lightheadedness with hypotension: Yes Has patient had a PCN reaction causing severe rash involving  mucus membranes or skin necrosis: No Has patient had a PCN reaction that required hospitalization Yes Has patient had a PCN reaction occurring within the last 10 years: No If all of the above answers are "NO", then may proceed with Cephalosporin use.  Marland Kitchen Pentazocine Other (See Comments)  . Bextra [Valdecoxib] Hives  . Codeine Nausea And Vomiting  . Lactose Intolerance (Gi) Diarrhea  . Meperidine Hcl Nausea And Vomiting  . Morphine Nausea And Vomiting  . Pentazocine Lactate     Muscle spasms     Past Medical History:  Diagnosis Date  . Arthritis   . Bipolar 1 disorder ([place])   . CAD (coronary artery disease) 2007   CABG X 1  . Depression    controlled  . Hay fever   . Headache    Hx of migraines  . Heart murmur   . Hyperlipidemia   . Hypertension   . Idiopathic hypersomnia   . Mitral valve prolapse   . PONV (postoperative nausea and vomiting)   . Urinary incontinence     Family History  Problem Relation Age of Onset  . Stroke Mother        died 74  . Aneurysm Mother   . Hypertension Mother   . Kidney disease Mother   . Bipolar disorder Mother   . Lung cancer Father        died 49  . Bipolar disorder Other   . Hypertension Other   . Osteoporosis Maternal Grandmother   . Arthritis Maternal Grandmother   . Alcohol abuse Maternal Grandfather     Social History   Socioeconomic History  . Marital status: Divorced    Spouse name: Not on file  . Number of children: Not on file  . Years of education: Not on file  . Highest education level: Not on file  Occupational History  . Occupation: retired  Tobacco Use  . Smoking status: Never Smoker  . Smokeless tobacco: Never Used  Vaping Use  . Vaping Use: Never used  Substance and Sexual Activity  . Alcohol use: No  . Drug use: No  . Sexual activity: Yes  Other Topics Concern  . Not on file  Social History Narrative  . Not on file   Social Determinants of Health   Financial Resource Strain:   . Difficulty of  Paying Living Expenses: Not on file  Food Insecurity:   . Worried About Charity fundraiser in the Last Year: Not on file  . Ran Out of Food in the Last Year: Not on file  Transportation Needs:   . Lack of Transportation (Medical): Not on file  . Lack of Transportation (Non-Medical): Not on file  Physical Activity:   . Days of Exercise per Week: Not on file  . Minutes of Exercise per Session: Not on file  Stress:   . Feeling of Stress : Not on file  Social Connections:   . Frequency of Communication with Friends and Family: Not on file  . Frequency of Social Gatherings with Friends and Family: Not on file  . Attends Religious Services: Not on file  . Active Member of Clubs or Organizations: Not on file  .  Attends Archivist Meetings: Not on file  . Marital Status: Not on file  Intimate Partner Violence:   . Fear of Current or Ex-Partner: Not on file  . Emotionally Abused: Not on file  . Physically Abused: Not on file  . Sexually Abused: Not on file    Past Medical History, Surgical history, Social history, and Family history were reviewed and updated as appropriate.   Please see review of systems for further details on the patient's review from today.   Objective:   Physical Exam:  BP (!) 142/68   Pulse 67   LMP 01/10/1989 (Approximate)   Physical Exam Constitutional:      General: She is not in acute distress.    Appearance: She is well-developed.  Musculoskeletal:        General: No deformity.  Neurological:     Mental Status: She is alert and oriented to person, place, and time.     Motor: No tremor.     Coordination: Coordination normal.     Gait: Gait normal.  Psychiatric:        Attention and Perception: Attention normal. She is attentive.        Mood and Affect: Mood normal. Mood is not anxious or depressed. Affect is not labile, blunt, angry or inappropriate.        Speech: Speech normal. Speech is not rapid and pressured.        Behavior:  Behavior normal.        Thought Content: Thought content normal. Thought content does not include homicidal or suicidal ideation. Thought content does not include homicidal or suicidal plan.        Cognition and Memory: Cognition normal.        Judgment: Judgment normal.     Comments: Insight is good. No hypomania.       Lab Review:     Component Value Date/Time   NA 138 07/18/2019 0929   K 4.6 07/18/2019 0929   CL 99 07/18/2019 0929   CO2 23 07/18/2019 0929   GLUCOSE 90 07/18/2019 0929   GLUCOSE 80 09/20/2016 1644   BUN 40 (H) 07/18/2019 0929   CREATININE 1.12 (H) 07/18/2019 0929   CREATININE 0.70 08/11/2015 1155   CALCIUM 9.8 07/18/2019 0929   PROT 6.4 04/29/2019 0908   ALBUMIN 4.2 04/29/2019 0908   AST 23 04/29/2019 0908   ALT 16 04/29/2019 0908   ALKPHOS 131 (H) 04/29/2019 0908   BILITOT 0.5 04/29/2019 0908   GFRNONAA 50 (L) 07/18/2019 0929   GFRAA 57 (L) 07/18/2019 0929       Component Value Date/Time   WBC 5.2 04/29/2019 0908   WBC 8.9 09/20/2016 1644   RBC 4.86 04/29/2019 0908   RBC 4.71 09/20/2016 1644   HGB 14.1 04/29/2019 0908   HCT 42.1 04/29/2019 0908   PLT 269 04/29/2019 0908   MCV 87 04/29/2019 0908   MCH 29.0 04/29/2019 0908   MCH 28.2 03/02/2016 0455   MCHC 33.5 04/29/2019 0908   MCHC 33.0 09/20/2016 1644   RDW 13.1 04/29/2019 0908   LYMPHSABS 1.9 09/20/2016 1644   MONOABS 0.5 09/20/2016 1644   EOSABS 0.0 09/20/2016 1644   BASOSABS 0.1 09/20/2016 1644    No results found for: POCLITH, LITHIUM   No results found for: PHENYTOIN, PHENOBARB, VALPROATE, CBMZ   .res Assessment: Plan:    Brandy [name] was seen today for follow-up and other.  Diagnoses and all orders for this visit:  Episodic mood disorder ([place]) -  FLUoxetine (PROZAC) 20 MG capsule; Take 1 capsule (20 mg total) by mouth daily.  Hypersomnolence disorder   Be careful with the amphetamine dose DT hx hypomania from it.  Disc the new med for hypersomnolence.  Value incorporating  napping into the day to deal with hypersomnolence.  Has remained stable.  No hypomania since here.  Have had extensive discussions in the past about whether or not she truly bipolar.  She is done well off mood stabilizers the last several years.  Her hypomania in the past may have been more connected to medication induced than true bipolar.  The longer she goes without hypomania the less likely she is truly bipolar.  She thinks prior stressful environment and lack of adequate sleep with higher dose stimulant made her look bipolar.   Reminded her about the signs and symptoms of mania and hypomania.  She is aware.  No indication to change fluoxetine.  Value exercise for mood.  This appt was 30 mins.  FU 12 mos  Brandy Parents, MD, DFAPA   Please see After Visit Summary for patient specific instructions.  Future Appointments  Date Time Provider [place]  12/02/2019  9:40 AM Troy Sine, MD [facility]    No orders of the defined types were placed in this encounter.     -------------------------------

## 2019-10-31 DIAGNOSIS — M25511 Pain in right shoulder: Secondary | ICD-10-CM | POA: Diagnosis not present

## 2019-11-07 DIAGNOSIS — Z5181 Encounter for therapeutic drug level monitoring: Secondary | ICD-10-CM | POA: Diagnosis not present

## 2019-11-07 DIAGNOSIS — L719 Rosacea, unspecified: Secondary | ICD-10-CM | POA: Diagnosis not present

## 2019-11-11 NOTE — Telephone Encounter (Signed)
Dr. Sood, please see mychart message sent by pt and advise. °

## 2019-11-12 MED ORDER — DEXTROAMPHETAMINE SULFATE 5 MG PO TABS
20.0000 mg | ORAL_TABLET | Freq: Every day | ORAL | 0 refills | Status: DC
Start: 2019-11-12 — End: 2019-12-16

## 2019-11-26 DIAGNOSIS — I10 Essential (primary) hypertension: Secondary | ICD-10-CM | POA: Diagnosis not present

## 2019-11-26 DIAGNOSIS — E785 Hyperlipidemia, unspecified: Secondary | ICD-10-CM | POA: Diagnosis not present

## 2019-11-26 DIAGNOSIS — Z951 Presence of aortocoronary bypass graft: Secondary | ICD-10-CM | POA: Diagnosis not present

## 2019-11-26 DIAGNOSIS — I2510 Atherosclerotic heart disease of native coronary artery without angina pectoris: Secondary | ICD-10-CM | POA: Diagnosis not present

## 2019-11-26 DIAGNOSIS — R002 Palpitations: Secondary | ICD-10-CM | POA: Diagnosis not present

## 2019-11-26 DIAGNOSIS — I2583 Coronary atherosclerosis due to lipid rich plaque: Secondary | ICD-10-CM | POA: Diagnosis not present

## 2019-11-26 LAB — COMPREHENSIVE METABOLIC PANEL
ALT: 21 IU/L (ref 0–32)
AST: 22 IU/L (ref 0–40)
Albumin/Globulin Ratio: 2.1 (ref 1.2–2.2)
Albumin: 4.5 g/dL (ref 3.7–4.7)
Alkaline Phosphatase: 95 IU/L (ref 44–121)
BUN/Creatinine Ratio: 33 — ABNORMAL HIGH (ref 12–28)
BUN: 28 mg/dL — ABNORMAL HIGH (ref 8–27)
Bilirubin Total: 0.5 mg/dL (ref 0.0–1.2)
CO2: 28 mmol/L (ref 20–29)
Calcium: 9.7 mg/dL (ref 8.7–10.3)
Chloride: 104 mmol/L (ref 96–106)
Creatinine, Ser: 0.84 mg/dL (ref 0.57–1.00)
GFR calc Af Amer: 81 mL/min/{1.73_m2} (ref >59)
GFR calc non Af Amer: 70 mL/min/{1.73_m2} (ref >59)
Globulin, Total: 2.1 g/dL (ref 1.5–4.5)
Glucose: 90 mg/dL (ref 65–99)
Potassium: 4.5 mmol/L (ref 3.5–5.2)
Sodium: 143 mmol/L (ref 134–144)
Total Protein: 6.6 g/dL (ref 6.0–8.5)

## 2019-11-26 LAB — LIPID PANEL
Chol/HDL Ratio: 2.6 ratio (ref 0.0–4.4)
Cholesterol, Total: 193 mg/dL (ref 100–199)
HDL: 74 mg/dL (ref >39)
LDL Chol Calc (NIH): 89 mg/dL (ref 0–99)
Triglycerides: 178 mg/dL — ABNORMAL HIGH (ref 0–149)
VLDL Cholesterol Cal: 30 mg/dL (ref 5–40)

## 2019-12-02 ENCOUNTER — Other Ambulatory Visit: Payer: Self-pay

## 2019-12-02 ENCOUNTER — Ambulatory Visit (INDEPENDENT_AMBULATORY_CARE_PROVIDER_SITE_OTHER): Payer: Medicare Other | Admitting: Cardiovascular Disease

## 2019-12-02 ENCOUNTER — Encounter: Payer: Self-pay | Admitting: Cardiovascular Disease

## 2019-12-02 VITALS — BP 130/78 | HR 76 | Ht 60.0 in | Wt 112.8 lb

## 2019-12-02 DIAGNOSIS — E785 Hyperlipidemia, unspecified: Secondary | ICD-10-CM | POA: Diagnosis not present

## 2019-12-02 DIAGNOSIS — I2510 Atherosclerotic heart disease of native coronary artery without angina pectoris: Secondary | ICD-10-CM | POA: Diagnosis not present

## 2019-12-02 DIAGNOSIS — I2583 Coronary atherosclerosis due to lipid rich plaque: Secondary | ICD-10-CM

## 2019-12-02 DIAGNOSIS — I10 Essential (primary) hypertension: Secondary | ICD-10-CM | POA: Diagnosis not present

## 2019-12-02 DIAGNOSIS — Z951 Presence of aortocoronary bypass graft: Secondary | ICD-10-CM | POA: Diagnosis not present

## 2019-12-02 NOTE — Progress Notes (Signed)
Is patient ID: Brandy [name], female   DOB: 1950/06/11, 69 y.o.   MRN: [id number]             HPI: Brandy ABBETT, is a 69 y.o. female who presents to the office for a 4 month follow-up cardiology evaluation.  In 2007 Brandy [name] was not having significant symptoms but because of a family history for peripheral vascular disease, abdominal aortic aneurysm, and somewhat labile blood pressure a Cardiolite study was done which suggested anterior wall ischemia. Subsequent cardiac catheterization revealing 95% ostial LAD stenosis. She underwent off pump LIMA to LAD bypass surgery by Dr. Roxan Hockey on 03/21/2005.  A echo Doppler study in April 2011 revealed an ejection fraction greater than 55%. There was grade 1 diastolic dysfunction. She had mild MR and nuclear perfusion scan was normal.  I had not seen her in over 3 years and she had stopped taking medication for blood pressure.  She recently saw Dr. Milta Deiters and her blood pressure was significantly elevated at 169/ 100.  She resumed taking Diovan and recently has been taking this at 160 mg daily.  She has a history of hyperlipidemia and has been taking Crestor 20 mg.  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 Charlott Holler of psychiatry and has been taking Prozac 20 mg.  She has developed left hip discomfort and needs surgery.  She also has a history of idiopathic hypersomnolence, and has been taking Provigil 200 mg daily for this.  When I saw her in 2017, her exam revealed a 2/6 systolic murmur along the left sternal border.  She was in need for left hip replacement surgery. I recommended that she undergo a [place] study for preoperative assessment and this was done on 12/08/2015 which revealed normal perfusion and function.  A 2-D echo Doppler study which was done on 01/14/2016 showed an EF of 65-70%.  There was mild grade 1 diastolic dysfunction.  She had mitral annular calcification with trivial MR.  There was mild TR.  There was  no evidence for aortic stenosis.  PA pressure was normal.  I saw her in follow-up in January 2018, and she was given clearance for surgery.  I last saw her in March 2019 and over the previous  14 months, she denied any episodes of chest pain, PND, orthopnea, palpitations, presyncope or syncope.  She has had some blood pressure variation.  Her valsartan was switched irbesartan due to the generic impurities.  Irbesartan was further increased to 300 mg due to recurrent blood pressure elevation  She had been on rosuvastatin, but stopped taking this several months ago due to myalgias.  During that evaluation her blood pressure was elevated and I added amlodipine 2.5 mg with target LDL less than 130.  She had stopped taking her rosuvastatin for hyperlipidemia.  She also was taking Provigil for idiopathic hypersomnia.  I have not seen her over the past 2 years.  I saw her on April 29, 2019 for her recent evaluation.  She has had issues with depression and sees Dr. Charlott Holler of psychiatry.  She has continued to be on Prozac 20 mg daily.  Remotely she was felt to be bipolar but is not felt to have this diagnosis any longer.  Apparently she became intolerant to Provigil and now has been on dextroamphetamine for her idiopathic hypersomnia.  Her MSLT test in April 2009 showed mean sleep latency at 4 minutes with 5 out of 5 naps and 0 naps with sleep  onset REM.  On her more recent dose of 15 mg dextroamphetamine she was still having difficulty with significant daytime sleepiness and was last evaluated by Dr. Halford Chessman in a telemedicine visit it was recommended she increase her dose and as such she has been taking 15 mg in the morning and 5 mg  afternoon.  She had noted some increased palpitations on her increased extra amphetamine.  She denied any significant chest pain.  She does yard work as needed.  She still felt that she needs to take daytime naps.    During her April 2020 evaluation, her blood pressure was elevated at  172/80.  She was taking valsartan 320 mg daily.  I recommended the addition of amlodipine 5 mg for optimal blood pressure control and discussed with her new hypertensive guidelines.  We also discussed the potential contribution of her dextroamphetamine.  I recommended that she see Joslyn Hy in our pharmacy clinic and she was evaluated by her on May 27, 2019 for follow-up evaluation.  At that time her blood pressure was still elevated and Kristen further recommended titration of amlodipine to 10 mg.    I saw her for follow-up evaluation in July 2021 Ms. Nickless developed significant leg edema on the increased amlodipine dose and this was discontinued and subsequently changed to chlorthalidone 25 mg.  Follow-up laboratory revealed an increase in her BUN and creatinine from 18/0.7 up to 40/1.12 since initiating chlorthalidone.  She no longer has leg edema.  She noted some fatigue.  She denied chest pain.  During that evaluation, I recommended she reduce her chlorthalidone to 12.5 mg daily in light of her prerenal azotemia and recommended follow-up laboratory.  Over the last several months, Brandy [name] has felt well.  Repeat laboratory showed improvement and resolution of her previous renal azotemia with a creatinine now at 0.84 and BUN at 28.  On rosuvastatin, recent lipid studies have shown marked improvement in LDL cholesterol from 176 down to 89 with total cholesterol 193 although triglycerides are mildly increased at 178 on labs from November 26, 2019.  She presents for evaluation.  Past Medical History:  Diagnosis Date  . Arthritis   . Bipolar 1 disorder ([place])   . CAD (coronary artery disease) 2007   CABG X 1  . Depression    controlled  . Hay fever   . Headache    Hx of migraines  . Heart murmur   . Hyperlipidemia   . Hypertension   . Idiopathic hypersomnia   . Mitral valve prolapse   . PONV (postoperative nausea and vomiting)   . Urinary incontinence     Past Surgical History:    Procedure Laterality Date  . cataract surgery     BIL  . CORONARY ARTERY BYPASS GRAFT  03/21/05   off pump LIMA-LAD  . myomectomy  1981  . reconstructive surgery  1945   lip   . [place]   3 times  . SHOULDER ARTHROSCOPY  2006  . TOTAL ABDOMINAL HYSTERECTOMY  1991  . TOTAL HIP ARTHROPLASTY Left 03/01/2016   Procedure: LEFT TOTAL HIP ARTHROPLASTY ANTERIOR APPROACH;  Surgeon: Paralee Cancel, MD;  Location: [facility];  Service: Orthopedics;  Laterality: Left;  Requests 70 mins  . trach    . VESICOVAGINAL FISTULA CLOSURE W/ TAH      Allergies  Allergen Reactions  . Penicillins Anaphylaxis    Has patient had a PCN reaction causing immediate rash, facial/tongue/throat swelling, SOB or lightheadedness with hypotension:  Yes Has patient had a PCN reaction causing severe rash involving mucus membranes or skin necrosis: No Has patient had a PCN reaction that required hospitalization Yes Has patient had a PCN reaction occurring within the last 10 years: No If all of the above answers are "NO", then may proceed with Cephalosporin use.  Marland Kitchen Pentazocine Other (See Comments)  . Bextra [Valdecoxib] Hives  . Codeine Nausea And Vomiting  . Lactose Intolerance (Gi) Diarrhea  . Meperidine Hcl Nausea And Vomiting  . Morphine Nausea And Vomiting  . Pentazocine Lactate     Muscle spasms     Current Outpatient Medications  Medication Sig Dispense Refill  . Acetaminophen (TYLENOL ARTHRITIS PAIN PO) Take by mouth.    . chlorthalidone (HYGROTON) 25 MG tablet Take 0.5 tablets (12.5 mg total) by mouth daily. 45 tablet 3  . Cholecalciferol (VITAMIN D3) 125 MCG (5000 UT) CAPS Take 1 capsule by mouth daily.    Marland Kitchen dextroamphetamine (DEXTROSTAT) 5 MG tablet Take 4 tablets (20 mg total) by mouth daily. 120 tablet 0  . famotidine (PEPCID) 20 MG tablet Take 20 mg by mouth daily.    Marland Kitchen FLUoxetine (PROZAC) 20 MG capsule Take 1 capsule (20 mg total) by mouth daily. 90 capsule 3  . Multiple  Vitamins-Minerals (MULTIPLE VITAMINS/WOMENS) tablet Take 1 tablet by mouth daily.    . Naproxen Sodium (ALEVE PO) Take by mouth.    . rosuvastatin (CRESTOR) 40 MG tablet Take 1 tablet (40 mg total) by mouth daily. 90 tablet 3  . terbinafine (LAMISIL) 250 MG tablet Take 1 tablet (250 mg total) by mouth daily. 90 tablet 0  . valsartan (DIOVAN) 320 MG tablet Take 1 tablet (320 mg total) by mouth daily. 90 tablet 3   Current Facility-Administered Medications  Medication Dose Route Frequency Provider Last Rate Last Admin  . ipratropium-albuterol (DUONEB) 0.5-2.5 (3) MG/3ML nebulizer solution 3 mL  3 mL Nebulization Once Dorothyann Peng, NP        Social History   Socioeconomic History  . Marital status: Divorced    Spouse name: Not on file  . Number of children: Not on file  . Years of education: Not on file  . Highest education level: Not on file  Occupational History  . Occupation: retired  Tobacco Use  . Smoking status: Never Smoker  . Smokeless tobacco: Never Used  Vaping Use  . Vaping Use: Never used  Substance and Sexual Activity  . Alcohol use: No  . Drug use: No  . Sexual activity: Yes  Other Topics Concern  . Not on file  Social History Narrative  . Not on file   Social Determinants of Health   Financial Resource Strain:   . Difficulty of Paying Living Expenses: Not on file  Food Insecurity:   . Worried About Charity fundraiser in the Last Year: Not on file  . Ran Out of Food in the Last Year: Not on file  Transportation Needs:   . Lack of Transportation (Medical): Not on file  . Lack of Transportation (Non-Medical): Not on file  Physical Activity:   . Days of Exercise per Week: Not on file  . Minutes of Exercise per Session: Not on file  Stress:   . Feeling of Stress : Not on file  Social Connections:   . Frequency of Communication with Friends and Family: Not on file  . Frequency of Social Gatherings with Friends and Family: Not on file  . Attends Religious  Services: Not on  file  . Active Member of Clubs or Organizations: Not on file  . Attends Archivist Meetings: Not on file  . Marital Status: Not on file  Intimate Partner Violence:   . Fear of Current or Ex-Partner: Not on file  . Emotionally Abused: Not on file  . Physically Abused: Not on file  . Sexually Abused: Not on file    Family History  Problem Relation Age of Onset  . Stroke Mother        died 65  . Aneurysm Mother   . Hypertension Mother   . Kidney disease Mother   . Bipolar disorder Mother   . Lung cancer Father        died 65  . Bipolar disorder Other   . Hypertension Other   . Osteoporosis Maternal Grandmother   . Arthritis Maternal Grandmother   . Alcohol abuse Maternal Grandfather    Socially she is divorced for >25 years. She is retired from [employer] since 2013.  She does not have any children. There is no tobacco use. No alcohol use. She does exercise at least 3 days per week  ROS General: Negative; No fevers, chills, or night sweats;  HEENT: Negative; No changes in vision or hearing, sinus congestion, difficulty swallowing Pulmonary: Negative; No cough, wheezing, shortness of breath, hemoptysis Cardiovascular: see HPI GI: Negative; No nausea, vomiting, diarrhea, or abdominal pain GU: Negative; No dysuria, hematuria, or difficulty voiding Musculoskeletal: Positive for left hip degeneration Hematologic/Oncology: Negative; no easy bruising, bleeding Endocrine: Negative; no heat/cold intolerance; no diabetes Neuro: Negative; no changes in balance, headaches Skin: Negative; No rashes or skin lesions Psychiatric: No longer diagnosed with bipolar disorder.  She continues to have episodic depression. Sleep: Positive for idiopathic hypersomnia with 5 out of 5 naps on and MSLT evaluation and 0 SOREM; no narcolepsy or cataplectic events.  No sleep apnea with PSG AHI 0.5   Other comprehensive 14 point system review is negative.   PE BP  130/78   Pulse 76   Ht 5' (1.524 m)   Wt 112 lb 12.8 oz (51.2 kg)   LMP 01/10/1989 (Approximate)   BMI 22.03 kg/m    Repeat blood pressure by me was 126/70  Wt Readings from Last 3 Encounters:  12/02/19 112 lb 12.8 oz (51.2 kg)  10/09/19 111 lb 9.6 oz (50.6 kg)  07/30/19 111 lb 12.8 oz (50.7 kg)   General: Alert, oriented, no distress.  Skin: normal turgor, no rashes, warm and dry HEENT: Normocephalic, atraumatic. Pupils equal round and reactive to light; sclera anicteric; extraocular muscles intact; Nose without nasal septal hypertrophy Mouth/Parynx benign; Mallinpatti scale 3 Neck: No JVD, no carotid bruits; normal carotid upstroke Lungs: clear to ausculatation and percussion; no wheezing or rales Chest wall: without tenderness to palpitation Heart: PMI not displaced, RRR, s1 s2 normal, 7-7/4 systolic murmur, no diastolic murmur, no rubs, gallops, thrills, or heaves Abdomen: soft, nontender; no hepatosplenomehaly, BS+; abdominal aorta nontender and not dilated by palpation. Back: no CVA tenderness Pulses 2+ Musculoskeletal: full range of motion, normal strength, no joint deformities Extremities: no clubbing cyanosis or edema, Homan's sign negative  Neurologic: grossly nonfocal; Cranial nerves grossly wnl Psychologic: Normal mood and affect  ECG (independently read by me): NSR at 76; normal intervals     July 2021 ECG (independently read by me): NSR 66; posibie LAE  April 29, 2019 ECG (independently read by me): NSR at 71; possible LAE; no ectopy; normal intervals  March 2019 ECG (independently  read by me): Sinus rhythm at 62  bpm.  Normal intervals.  No ST segment changes.  No ectopy.  January 2018 ECG (independently read by me): Normal sinus rhythm at 75 bpm.  No ST segment changes.  QTc interval normal at 422 ms.  No ectopy.  November 2017 ECG (independently read by me): Normal sinus rhythm at 67 bpm.  Normal intervals.  No significant ST-T changes.  November 2014 ECG:  Sinus rhythm at 64 beats per minute. QTc interval 412 ms. Agnostic Q-wave in lead III and F  LABS:  BMP Latest Ref Rng & Units 11/26/2019 07/18/2019 04/29/2019  Glucose 65 - 99 mg/dL 90 90 83  BUN 8 - 27 mg/dL 28(H) 40(H) 18  Creatinine 0.57 - 1.00 mg/dL 0.84 1.12(H) 0.70  BUN/Creat Ratio 12 - 28 33(H) 36(H) 26  Sodium 134 - 144 mmol/L 143 138 142  Potassium 3.5 - 5.2 mmol/L 4.5 4.6 4.9  Chloride 96 - 106 mmol/L 104 99 104  CO2 20 - 29 mmol/L _0 Calcium 8.7 - 10.3 mg/dL 9.7 9.8 9.4   Hepatic Function Latest Ref Rng & Units 11/26/2019 04/29/2019 09/20/2016  Total Protein 6.0 - 8.5 g/dL 6.6 6.4 6.8  Albumin 3.7 - 4.7 g/dL 4.5 4.2 4.6  AST 0 - 40 IU/L _1 ALT 0 - 32 IU/L _2 Alk Phosphatase 44 - 121 IU/L 95 131(H) 104  Total Bilirubin 0.0 - 1.2 mg/dL 0.5 0.5 0.5   CBC Latest Ref Rng & Units 04/29/2019 09/20/2016 03/02/2016  WBC 3.4 - 10.8 x10E3/uL 5.2 8.9 10.7(H)  Hemoglobin 11.1 - 15.9 g/dL 14.1 14.3 9.6(L)  Hematocrit 34.0 - 46.6 % 42.1 43.5 30.3(L)  Platelets 150 - 450 x10E3/uL 269 319.0 215   Lab Results  Component Value Date   MCV 87 04/29/2019   MCV 92.3 09/20/2016   MCV 89.1 03/02/2016   Lab Results  Component Value Date   TSH 2.680 04/29/2019   Lipid Panel     Component Value Date/Time   CHOL 193 11/26/2019 1002   TRIG 178 (H) 11/26/2019 1002   HDL 74 11/26/2019 1002   CHOLHDL 2.6 11/26/2019 1002   CHOLHDL 2 09/20/2016 1644   VLDL 30.0 09/20/2016 1644   LDLCALC 89 11/26/2019 1002    RADIOLOGY: No results found.  IMPRESSION:  1. Essential hypertension   2. Coronary artery disease due to lipid rich plaque   3. Hx of CABG   4. Hyperlipidemia with target LDL less than 70    ASSESSMENT AND PLAN: Ms Quijas is a 69 year-old Caucasian female who underwent CABG revascularization surgery in 2007 after cardiac catheterization revealed 95% ostial LAD stenosis encroaching upon the left main. She underwent off-pump LIMA to LAD bypass. Prior to her  surgery, she was completely asymptomatic and anterior ischemia was noted routinely on Cardiolite imaging.  Her last nuclear perfusion study in November 2017 continued to show normal perfusion.  She has normal to hyperdynamic ejection fraction and mitral annular calcification and there was no evidence for aortic stenosis.  There was mild MR and TR. he denies any instability now 14 years status post CABG revascularization surgery.  Over the past year, her blood pressure had significantly increased.  Adjustments were made to her medical regimen.  She had developed significant ankle feet edema on amlodipine which ultimately resolved with chlorthalidone.  Her dose of chlorthalidone was reduced secondary to prerenal azotemia and presently she is doing well on 12.5 mg of  chlorthalidone and 320 mg of valsartan with stable blood pressure and renal function.  She is now on rosuvastatin 40 mg daily for hyperlipidemia.  LDL cholesterol has significantly improved from 176 down to 89.  She did have mild triglyceride elevation at 178.  I have suggested over-the-counter fish oil and in particular natures bounty 1400 mg which has 980 mg of both EPA and DHA active ingredients.  She is not having any anginal symptoms.  I will see her in 6 months for reevaluation or sooner as needed.  Troy Sine, MD, [facility]  12/02/2019 6:36 PM

## 2019-12-02 NOTE — Progress Notes (Signed)
Subjective:   Brandy [name] is a 69 y.o. female who presents for Medicare Annual (Subsequent) preventive examination.  I connected with Brandy [name] oday by telephone and verified that I am speaking with the correct person using two identifiers. Location patient: home Location provider: work Persons participating in the virtual visit: patient, Marine scientist.    I discussed the limitations, risks, security and privacy concerns of performing an evaluation and management service by telephone and the availability of in person appointments. I also discussed with the patient that there may be a patient responsible charge related to this service. The patient expressed understanding and verbally consented to this telephonic visit.    Interactive audio and video telecommunications were attempted between this provider and patient, however failed, due to patient having technical difficulties OR patient did not have access to video capability.  We continued and completed visit with audio only.  Some vital signs may be absent or patient reported.   Time Spent with patient on telephone encounter: 25 minutes  Review of Systems     Cardiac Risk Factors include: advanced age (>7men, >28 women);hypertension;dyslipidemia     Objective:    Today's Vitals   12/03/19 0952  Weight: 110 lb (49.9 kg)  Height: 5' (1.524 m)   Body mass index is 21.48 kg/m.  Advanced Directives 12/03/2019 10/24/2018 05/24/2017 03/01/2016 03/01/2016 02/23/2016  Does Patient Have a Medical Advance Directive? No No Yes Yes Yes Yes  Type of Advance Directive - - Public librarian;Living will [place];Living will Living will  Does patient want to make changes to medical advance directive? - - - No - Patient declined - -  Copy of [place] in Chart? - - - Yes Yes -  Would patient like information on creating a medical advance directive? Yes (MAU/Ambulatory/Procedural Areas -  Information given) No - Patient declined - - - -    Current Medications (verified) Outpatient Encounter Medications as of 12/03/2019  Medication Sig  . Acetaminophen (TYLENOL ARTHRITIS PAIN PO) Take by mouth.  . Cholecalciferol (VITAMIN D3) 125 MCG (5000 UT) CAPS Take 1 capsule by mouth daily.  Marland Kitchen dextroamphetamine (DEXTROSTAT) 5 MG tablet Take 4 tablets (20 mg total) by mouth daily.  . famotidine (PEPCID) 20 MG tablet Take 20 mg by mouth daily.  Marland Kitchen FLUoxetine (PROZAC) 20 MG capsule Take 1 capsule (20 mg total) by mouth daily.  . Multiple Vitamins-Minerals (MULTIPLE VITAMINS/WOMENS) tablet Take 1 tablet by mouth daily.  . Naproxen Sodium (ALEVE PO) Take by mouth.  . rosuvastatin (CRESTOR) 40 MG tablet Take 1 tablet (40 mg total) by mouth daily.  Marland Kitchen terbinafine (LAMISIL) 250 MG tablet Take 1 tablet (250 mg total) by mouth daily.  . valsartan (DIOVAN) 320 MG tablet Take 1 tablet (320 mg total) by mouth daily.  . chlorthalidone (HYGROTON) 25 MG tablet Take 0.5 tablets (12.5 mg total) by mouth daily.   Facility-Administered Encounter Medications as of 12/03/2019  Medication  . ipratropium-albuterol (DUONEB) 0.5-2.5 (3) MG/3ML nebulizer solution 3 mL    Allergies (verified) Penicillins, Pentazocine, Bextra [valdecoxib], Codeine, Lactose intolerance (gi), Meperidine hcl, Morphine, and Pentazocine lactate   History: Past Medical History:  Diagnosis Date  . Arthritis   . Bipolar 1 disorder ([place])   . CAD (coronary artery disease) 2007   CABG X 1  . Depression    controlled  . Hay fever   . Headache    Hx of migraines  . Heart murmur   .  Hyperlipidemia   . Hypertension   . Idiopathic hypersomnia   . Mitral valve prolapse   . PONV (postoperative nausea and vomiting)   . Urinary incontinence    Past Surgical History:  Procedure Laterality Date  . cataract surgery     BIL  . CORONARY ARTERY BYPASS GRAFT  03/21/05   off pump LIMA-LAD  . myomectomy  1981  . reconstructive surgery   1945   lip   . [place]   3 times  . SHOULDER ARTHROSCOPY  2006  . TOTAL ABDOMINAL HYSTERECTOMY  1991  . TOTAL HIP ARTHROPLASTY Left 03/01/2016   Procedure: LEFT TOTAL HIP ARTHROPLASTY ANTERIOR APPROACH;  Surgeon: Paralee Cancel, MD;  Location: [facility];  Service: Orthopedics;  Laterality: Left;  Requests 70 mins  . trach    . VESICOVAGINAL FISTULA CLOSURE W/ TAH     Family History  Problem Relation Age of Onset  . Stroke Mother        died 39  . Aneurysm Mother   . Hypertension Mother   . Kidney disease Mother   . Bipolar disorder Mother   . Lung cancer Father        died 57  . Bipolar disorder Other   . Hypertension Other   . Osteoporosis Maternal Grandmother   . Arthritis Maternal Grandmother   . Alcohol abuse Maternal Grandfather    Social History   Socioeconomic History  . Marital status: Divorced    Spouse name: Not on file  . Number of children: Not on file  . Years of education: Not on file  . Highest education level: Not on file  Occupational History  . Occupation: retired  Tobacco Use  . Smoking status: Never Smoker  . Smokeless tobacco: Never Used  Vaping Use  . Vaping Use: Never used  Substance and Sexual Activity  . Alcohol use: No  . Drug use: No  . Sexual activity: Yes  Other Topics Concern  . Not on file  Social History Narrative  . Not on file   Social Determinants of Health   Financial Resource Strain: Low Risk   . Difficulty of Paying Living Expenses: Not hard at all  Food Insecurity: No Food Insecurity  . Worried About Charity fundraiser in the Last Year: Never true  . Ran Out of Food in the Last Year: Never true  Transportation Needs: No Transportation Needs  . Lack of Transportation (Medical): No  . Lack of Transportation (Non-Medical): No  Physical Activity: Sufficiently Active  . Days of Exercise per Week: 3 days  . Minutes of Exercise per Session: 60 min  Stress: No Stress Concern Present  . Feeling of Stress : Not  at all  Social Connections: Moderately Integrated  . Frequency of Communication with Friends and Family: More than three times a week  . Frequency of Social Gatherings with Friends and Family: More than three times a week  . Attends Religious Services: 1 to 4 times per year  . Active Member of Clubs or Organizations: Yes  . Attends Archivist Meetings: 1 to 4 times per year  . Marital Status: Divorced    Tobacco Counseling Counseling given: Not Answered   Clinical Intake:  Pre-visit preparation completed: Yes  Pain : No/denies pain     Nutritional Status: BMI of 19-24  Normal Nutritional Risks: None Diabetes: No  How often do you need to have someone help you when you read instructions, pamphlets, or other  written materials from your doctor or pharmacy?: 1 - Never What is the last grade level you completed in school?: 6 years of colege  Diabetic?No  Interpreter Needed?: No  Information entered by :: Caroleen Hamman LPN   Activities of Daily Living In your present state of health, do you have any difficulty performing the following activities: 12/03/2019  Hearing? N  Vision? N  Difficulty concentrating or making decisions? N  Walking or climbing stairs? N  Dressing or bathing? N  Doing errands, shopping? N  Preparing Food and eating ? N  Using the Toilet? N  In the past six months, have you accidently leaked urine? N  Do you have problems with loss of bowel control? Y  Comment occasional  Managing your Medications? N  Managing your Finances? N  Housekeeping or managing your Housekeeping? N  Some recent data might be hidden    Patient Care Team: Ronnald Nian, DO as PCP - General (Family Medicine)  Indicate any recent Medical Services you may have received from other than [facility] providers in the past year (date may be approximate).     Assessment:   This is a routine wellness examination for [place].  Hearing/Vision screen  Hearing Screening     125Hz  250Hz  500Hz  1000Hz  2000Hz  3000Hz  4000Hz  6000Hz  8000Hz   Right ear:           Left ear:           Comments: No issues   Dietary issues and exercise activities discussed: Current Exercise Habits: Home exercise routine, Type of exercise: Other - see comments (rowing and yard work), Time (Minutes): 60, Frequency (Times/Week): 3, Weekly Exercise (Minutes/Week): 180, Intensity: Mild, Exercise limited by: None identified  Goals    . Patient Stated     Do more aerobic & weight bearing exercises      Depression Screen PHQ 2/9 Scores 12/03/2019 10/24/2018 05/24/2017 09/20/2016  PHQ - 2 Score 0 0 0 0    Fall Risk Fall Risk  12/03/2019 10/24/2018 08/23/2018 05/24/2017 09/20/2016  Falls in the past year? 0 0 0 No No  Number falls in past yr: 0 0 - - -  Injury with Fall? 0 0 - - -  Follow up Falls prevention discussed - Falls evaluation completed - -    Any stairs in or around the home? Yes  If so, are there any without handrails? No  Home free of loose throw rugs in walkways, pet beds, electrical cords, etc? Yes  Adequate lighting in your home to reduce risk of falls? Yes   ASSISTIVE DEVICES UTILIZED TO PREVENT FALLS:  Life alert? No  Use of a cane, walker or w/c? No  Grab bars in the bathroom? No  Shower chair or bench in shower? No  Elevated toilet seat or a handicapped toilet? No   TIMED UP AND GO:  Was the test performed? No . Phone visit   Cognitive Function:No cognitive impairment noted        Immunizations Immunization History  Administered Date(s) Administered  . Fluad Quad(high Dose 65+) 09/26/2018, 10/09/2019  . Influenza Split 10/08/2016  . Influenza Whole 10/10/2009  . Influenza, High Dose Seasonal PF 11/09/2017  . Influenza-Unspecified 11/11/2010  . PFIZER SARS-COV-2 Vaccination 02/15/2019, 03/12/2019, 10/07/2019  . Pneumococcal Conjugate-13 11/09/2017  . Pneumococcal Polysaccharide-23 10/09/2019  . Tdap 07/25/2018  . Varicella 07/25/2018, 08/23/2018     TDAP status: Up to date   Flu Vaccine status: Up to date   Pneumococcal vaccine status: Up  to date   Covid-19 vaccine status: Completed vaccines  Qualifies for Shingles Vaccine? Yes   Zostavax completed No   Shingrix Completed?: No.    Education has been provided regarding the importance of this vaccine. Patient has been advised to call insurance company to determine out of pocket expense if they have not yet received this vaccine. Advised may also receive vaccine at local pharmacy or Health Dept. Verbalized acceptance and understanding.  Screening Tests Health Maintenance  Topic Date Due  . MAMMOGRAM  09/26/2020  . COLONOSCOPY  12/10/2025  . TETANUS/TDAP  07/24/2028  . INFLUENZA VACCINE  Completed  . DEXA SCAN  Completed  . COVID-19 Vaccine  Completed  . Hepatitis C Screening  Completed  . PNA vac Low Risk Adult  Completed    Health Maintenance  There are no preventive care reminders to display for this patient.  Colorectal cancer screening: Completed Colonoscopy 12/11/2015. Repeat every 10 years   Mammogram Status: Due- Declined  Bone Density status: Scheduled for 12/31/19  Lung Cancer Screening: (Low Dose CT Chest recommended if Age 68-80 years, 30 pack-year currently smoking OR have quit w/in 15years.) does not qualify.     Additional Screening:  Hepatitis C Screening:Completed 01/11/2016  Vision Screening: Recommended annual ophthalmology exams for early detection of glaucoma and other disorders of the eye. Is the patient up to date with their annual eye exam?  No  Who is the provider or what is the name of the office in which the patient attends annual eye exams? Dr. Herbert Deaner Patient to call & schedule  Dental Screening: Recommended annual dental exams for proper oral hygiene  Community Resource Referral / Chronic Care Management: CRR required this visit?  No   CCM required this visit?  No      Plan:     I have personally reviewed and noted the  following in the patient's chart:   . Medical and social history . Use of alcohol, tobacco or illicit drugs  . Current medications and supplements . Functional ability and status . Nutritional status . Physical activity . Advanced directives . List of other physicians . Hospitalizations, surgeries, and ER visits in previous 12 months . Vitals . Screenings to include cognitive, depression, and falls . Referrals and appointments  In addition, I have reviewed and discussed with patient certain preventive protocols, quality metrics, and best practice recommendations. A written personalized care plan for preventive services as well as general preventive health recommendations were provided to patient.   Due to this being a telephonic visit, the after visit summary with patients personalized plan was offered to patient via mail or my-chart. Patient would like to access on my-chart.   Marta Antu, LPN   13/24/4010  Nurse Health Advisor  Nurse Notes: None

## 2019-12-02 NOTE — Patient Instructions (Signed)
Medication Instructions:    Recommend you take over the counter - Omega 3 Fish oil tablets  *If you need a refill on your cardiac medications before your next appointment, please call your pharmacy*   Lab Work:  Not needed   Testing/Procedures: Not neede   Follow-Up: At [facility], you and your health needs are our priority.  As part of our continuing mission to provide you with exceptional heart care, we have created designated Provider Care Teams.  These Care Teams include your primary Cardiologist (physician) and Advanced Practice Providers (APPs -  Physician Assistants and Nurse Practitioners) who all work together to provide you with the care you need, when you need it.     Your next appointment:   6 month(s)  The format for your next appointment:   In Person  Provider:   Shelva Majestic, MD   Other Instructions

## 2019-12-03 ENCOUNTER — Ambulatory Visit (INDEPENDENT_AMBULATORY_CARE_PROVIDER_SITE_OTHER): Payer: Medicare Other

## 2019-12-03 VITALS — Ht 60.0 in | Wt 110.0 lb

## 2019-12-03 DIAGNOSIS — Z0000 Encounter for general adult medical examination without abnormal findings: Secondary | ICD-10-CM

## 2019-12-03 NOTE — Patient Instructions (Signed)
[name] , Thank you for taking time to complete your Medicare Wellness Visit. I appreciate your ongoing commitment to your health goals. Please review the following plan we discussed and let me know if I can assist you in the future.   Screening recommendations/referrals: Colonoscopy: Completed 12/11/2015-Due 12/10/2025 Mammogram: Due- Declined at this time.Please call the office to schedule if you change your mind. Bone Density: Scheduled for 12/31/19 Recommended yearly ophthalmology/optometry visit for glaucoma screening and checkup Recommended yearly dental visit for hygiene and checkup  Vaccinations: Influenza vaccine: Up to date Pneumococcal vaccine: Completed vaccines Tdap vaccine: Up to date-Due-07/24/2028 Shingles vaccine: Not indicated   Covid-19:Completed vaccines  Advanced directives: Information mailed today.  Conditions/risks identified: See problem list  Next appointment: Follow up in one year for your annual wellness visit 12/08/2020 @ 9:45am   Preventive Care 65 Years and Older, Female Preventive care refers to lifestyle choices and visits with your health care provider that can promote health and wellness. What does preventive care include?  A yearly physical exam. This is also called an annual well check.  Dental exams once or twice a year.  Routine eye exams. Ask your health care provider how often you should have your eyes checked.  Personal lifestyle choices, including:  Daily care of your teeth and gums.  Regular physical activity.  Eating a healthy diet.  Avoiding tobacco and drug use.  Limiting alcohol use.  Practicing safe sex.  Taking low-dose aspirin every day.  Taking vitamin and mineral supplements as recommended by your health care provider. What happens during an annual well check? The services and screenings done by your health care provider during your annual well check will depend on your age, overall health, lifestyle risk  factors, and family history of disease. Counseling  Your health care provider may ask you questions about your:  Alcohol use.  Tobacco use.  Drug use.  Emotional well-being.  Home and relationship well-being.  Sexual activity.  Eating habits.  History of falls.  Memory and ability to understand (cognition).  Work and work Statistician.  Reproductive health. Screening  You may have the following tests or measurements:  Height, weight, and BMI.  Blood pressure.  Lipid and cholesterol levels. These may be checked every 5 years, or more frequently if you are over 105 years old.  Skin check.  Lung cancer screening. You may have this screening every year starting at age 39 if you have a 30-pack-year history of smoking and currently smoke or have quit within the past 15 years.  Fecal occult blood test (FOBT) of the stool. You may have this test every year starting at age 48.  Flexible sigmoidoscopy or colonoscopy. You may have a sigmoidoscopy every 5 years or a colonoscopy every 10 years starting at age 36.  Hepatitis C blood test.  Hepatitis B blood test.  Sexually transmitted disease (STD) testing.  Diabetes screening. This is done by checking your blood sugar (glucose) after you have not eaten for a while (fasting). You may have this done every 1-3 years.  Bone density scan. This is done to screen for osteoporosis. You may have this done starting at age 66.  Mammogram. This may be done every 1-2 years. Talk to your health care provider about how often you should have regular mammograms. Talk with your health care provider about your test results, treatment options, and if necessary, the need for more tests. Vaccines  Your health care provider may recommend certain vaccines, such as:  Influenza  vaccine. This is recommended every year.  Tetanus, diphtheria, and acellular pertussis (Tdap, Td) vaccine. You may need a Td booster every 10 years.  Zoster vaccine. You  may need this after age 58.  Pneumococcal 13-valent conjugate (PCV13) vaccine. One dose is recommended after age 64.  Pneumococcal polysaccharide (PPSV23) vaccine. One dose is recommended after age 54. Talk to your health care provider about which screenings and vaccines you need and how often you need them. This information is not intended to replace advice given to you by your health care provider. Make sure you discuss any questions you have with your health care provider. Document Released: 01/23/2015 Document Revised: 09/16/2015 Document Reviewed: 10/28/2014 Elsevier Interactive Patient Education  2017 [place] Prevention in the Home Falls can cause injuries. They can happen to people of all ages. There are many things you can do to make your home safe and to help prevent falls. What can I do on the outside of my home?  Regularly fix the edges of walkways and driveways and fix any cracks.  Remove anything that might make you trip as you walk through a door, such as a raised step or threshold.  Trim any bushes or trees on the path to your home.  Use bright outdoor lighting.  Clear any walking paths of anything that might make someone trip, such as rocks or tools.  Regularly check to see if handrails are loose or broken. Make sure that both sides of any steps have handrails.  Any raised decks and porches should have guardrails on the edges.  Have any leaves, snow, or ice cleared regularly.  Use sand or salt on walking paths during winter.  Clean up any spills in your garage right away. This includes oil or grease spills. What can I do in the bathroom?  Use night lights.  Install grab bars by the toilet and in the tub and shower. Do not use towel bars as grab bars.  Use non-skid mats or decals in the tub or shower.  If you need to sit down in the shower, use a plastic, non-slip stool.  Keep the floor dry. Clean up any water that spills on the floor as soon as  it happens.  Remove soap buildup in the tub or shower regularly.  Attach bath mats securely with double-sided non-slip rug tape.  Do not have throw rugs and other things on the floor that can make you trip. What can I do in the bedroom?  Use night lights.  Make sure that you have a light by your bed that is easy to reach.  Do not use any sheets or blankets that are too big for your bed. They should not hang down onto the floor.  Have a firm chair that has side arms. You can use this for support while you get dressed.  Do not have throw rugs and other things on the floor that can make you trip. What can I do in the kitchen?  Clean up any spills right away.  Avoid walking on wet floors.  Keep items that you use a lot in easy-to-reach places.  If you need to reach something above you, use a strong step stool that has a grab bar.  Keep electrical cords out of the way.  Do not use floor polish or wax that makes floors slippery. If you must use wax, use non-skid floor wax.  Do not have throw rugs and other things on the floor that can  make you trip. What can I do with my stairs?  Do not leave any items on the stairs.  Make sure that there are handrails on both sides of the stairs and use them. Fix handrails that are broken or loose. Make sure that handrails are as long as the stairways.  Check any carpeting to make sure that it is firmly attached to the stairs. Fix any carpet that is loose or worn.  Avoid having throw rugs at the top or bottom of the stairs. If you do have throw rugs, attach them to the floor with carpet tape.  Make sure that you have a light switch at the top of the stairs and the bottom of the stairs. If you do not have them, ask someone to add them for you. What else can I do to help prevent falls?  Wear shoes that:  Do not have high heels.  Have rubber bottoms.  Are comfortable and fit you well.  Are closed at the toe. Do not wear sandals.  If  you use a stepladder:  Make sure that it is fully opened. Do not climb a closed stepladder.  Make sure that both sides of the stepladder are locked into place.  Ask someone to hold it for you, if possible.  Clearly mark and make sure that you can see:  Any grab bars or handrails.  First and last steps.  Where the edge of each step is.  Use tools that help you move around (mobility aids) if they are needed. These include:  Canes.  Walkers.  Scooters.  Crutches.  Turn on the lights when you go into a dark area. Replace any light bulbs as soon as they burn out.  Set up your furniture so you have a clear path. Avoid moving your furniture around.  If any of your floors are uneven, fix them.  If there are any pets around you, be aware of where they are.  Review your medicines with your doctor. Some medicines can make you feel dizzy. This can increase your chance of falling. Ask your doctor what other things that you can do to help prevent falls. This information is not intended to replace advice given to you by your health care provider. Make sure you discuss any questions you have with your health care provider. Document Released: 10/23/2008 Document Revised: 06/04/2015 Document Reviewed: 01/31/2014 Elsevier Interactive Patient Education  2017 [employer].

## 2019-12-10 DIAGNOSIS — L821 Other seborrheic keratosis: Secondary | ICD-10-CM | POA: Diagnosis not present

## 2019-12-10 DIAGNOSIS — L578 Other skin changes due to chronic exposure to nonionizing radiation: Secondary | ICD-10-CM | POA: Diagnosis not present

## 2019-12-10 DIAGNOSIS — D225 Melanocytic nevi of trunk: Secondary | ICD-10-CM | POA: Diagnosis not present

## 2019-12-10 DIAGNOSIS — L814 Other melanin hyperpigmentation: Secondary | ICD-10-CM | POA: Diagnosis not present

## 2019-12-10 DIAGNOSIS — L570 Actinic keratosis: Secondary | ICD-10-CM | POA: Diagnosis not present

## 2019-12-16 ENCOUNTER — Other Ambulatory Visit: Payer: Self-pay | Admitting: Pulmonary Disease

## 2019-12-16 MED ORDER — DEXTROAMPHETAMINE SULFATE 5 MG PO TABS
20.0000 mg | ORAL_TABLET | Freq: Every day | ORAL | 0 refills | Status: DC
Start: 2019-12-16 — End: 2020-01-21

## 2019-12-16 NOTE — Telephone Encounter (Signed)
Dr. Halford Chessman please advise on refill for the pt.  Thanks  Last ov--08/10/2018 Next ov--no pending visits scheduled.   Allergies  Allergen Reactions  . Penicillins Anaphylaxis    Has patient had a PCN reaction causing immediate rash, facial/tongue/throat swelling, SOB or lightheadedness with hypotension: Yes Has patient had a PCN reaction causing severe rash involving mucus membranes or skin necrosis: No Has patient had a PCN reaction that required hospitalization Yes Has patient had a PCN reaction occurring within the last 10 years: No If all of the above answers are "NO", then may proceed with Cephalosporin use.  Marland Kitchen Pentazocine Other (See Comments)  . Bextra [Valdecoxib] Hives  . Codeine Nausea And Vomiting  . Lactose Intolerance (Gi) Diarrhea  . Meperidine Hcl Nausea And Vomiting  . Morphine Nausea And Vomiting  . Pentazocine Lactate     Muscle spasms      Current Outpatient Medications on File Prior to Visit  Medication Sig Dispense Refill  . Acetaminophen (TYLENOL ARTHRITIS PAIN PO) Take by mouth.    . chlorthalidone (HYGROTON) 25 MG tablet Take 0.5 tablets (12.5 mg total) by mouth daily. 45 tablet 3  . Cholecalciferol (VITAMIN D3) 125 MCG (5000 UT) CAPS Take 1 capsule by mouth daily.    Marland Kitchen dextroamphetamine (DEXTROSTAT) 5 MG tablet Take 4 tablets (20 mg total) by mouth daily. 120 tablet 0  . famotidine (PEPCID) 20 MG tablet Take 20 mg by mouth daily.    Marland Kitchen FLUoxetine (PROZAC) 20 MG capsule Take 1 capsule (20 mg total) by mouth daily. 90 capsule 3  . Multiple Vitamins-Minerals (MULTIPLE VITAMINS/WOMENS) tablet Take 1 tablet by mouth daily.    . Naproxen Sodium (ALEVE PO) Take by mouth.    . rosuvastatin (CRESTOR) 40 MG tablet Take 1 tablet (40 mg total) by mouth daily. 90 tablet 3  . terbinafine (LAMISIL) 250 MG tablet Take 1 tablet (250 mg total) by mouth daily. 90 tablet 0  . valsartan (DIOVAN) 320 MG tablet Take 1 tablet (320 mg total) by mouth daily. 90 tablet 3   Current  Facility-Administered Medications on File Prior to Visit  Medication Dose Route Frequency Provider Last Rate Last Admin  . ipratropium-albuterol (DUONEB) 0.5-2.5 (3) MG/3ML nebulizer solution 3 mL  3 mL Nebulization Once Dorothyann Peng, NP

## 2019-12-31 DIAGNOSIS — N958 Other specified menopausal and perimenopausal disorders: Secondary | ICD-10-CM | POA: Diagnosis not present

## 2019-12-31 DIAGNOSIS — Z01419 Encounter for gynecological examination (general) (routine) without abnormal findings: Secondary | ICD-10-CM | POA: Diagnosis not present

## 2019-12-31 DIAGNOSIS — Z1231 Encounter for screening mammogram for malignant neoplasm of breast: Secondary | ICD-10-CM | POA: Diagnosis not present

## 2019-12-31 DIAGNOSIS — Z6822 Body mass index (BMI) 22.0-22.9, adult: Secondary | ICD-10-CM | POA: Diagnosis not present

## 2020-01-20 NOTE — Telephone Encounter (Signed)
PLEASE NOTE PHARMACY CHANGE.Marland Kitchen Please send an escript for the following medication to: [facility] [place], [place]. [address] [phone] MEDICATION:  Generic dextroamphetamine sulfate, 5mg  tab AUR/ 4 tablets by mouth daily

## 2020-01-20 NOTE — Telephone Encounter (Signed)
Please advise on patient mychart message  Generic dextroamphetamine sulfate 5mg  tabs.  Just wanted to add a 30-day supply of this medication was last dispensed on Dec 19, 2019.  Thanks & kind regards,  Kayleigh Broadwell

## 2020-01-20 NOTE — Telephone Encounter (Signed)
Please see other encounter from today 1/10. Will close this one.

## 2020-01-21 MED ORDER — DEXTROAMPHETAMINE SULFATE 5 MG PO TABS
20.0000 mg | ORAL_TABLET | Freq: Every day | ORAL | 0 refills | Status: DC
Start: 2020-01-21 — End: 2020-02-21

## 2020-01-21 NOTE — Telephone Encounter (Signed)
Refill sent to [facility] on [address].

## 2020-01-30 DIAGNOSIS — K219 Gastro-esophageal reflux disease without esophagitis: Secondary | ICD-10-CM | POA: Diagnosis not present

## 2020-01-30 DIAGNOSIS — R490 Dysphonia: Secondary | ICD-10-CM | POA: Diagnosis not present

## 2020-02-20 NOTE — Telephone Encounter (Signed)
Dr. Halford Chessman, Please see patient comment regarding refill.  Thank you.

## 2020-02-21 ENCOUNTER — Telehealth: Payer: Self-pay | Admitting: Pulmonary Disease

## 2020-02-21 MED ORDER — DEXTROAMPHETAMINE SULFATE 5 MG PO TABS
20.0000 mg | ORAL_TABLET | Freq: Every day | ORAL | 0 refills | Status: DC
Start: 2020-02-21 — End: 2020-04-06

## 2020-02-21 NOTE — Telephone Encounter (Signed)
Called and spoke to patient. Patient is requesting refill on Dextrostat 5mg . Patient is leaving to go out of town on Sunday and she will run out of medication while out of town.  Last refilled 01/21/2020 #120 with 0 refills.  Patient last seen 02/21/2019 with no pending appt.   Tammy, please advise as Dr. Halford Chessman is unavailable.

## 2020-02-21 NOTE — Telephone Encounter (Signed)
I will send in 1 refill for this only she was supposed to be seen in the office for 83-month follow-up.  She will need to make an appointment to get any additional refills of this medication Dr. Juanetta Gosling notes were reviewed and patient was recommended to continue on this medication.  He is out of the office will refill x1 only  PMP reviewed

## 2020-02-21 NOTE — Telephone Encounter (Signed)
Patient sent email Dr. Halford [name] is not back until next week  Hi Brandy [name].  HELP!!  Can a nurse practioner or Dr. Annamaria [name] send the script?    Can you contact [name] Kitchen Brandy [name] so he can authorize the script?  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   I have to get a new script every 30 days because it's a controlled substance and the pharmacy won't fill it ahead of time.  I can't wait until Dr. Halford [name] returns next week because I won't be here;  I'll be in [place] from Sunday Feb 13 to Friday Feb 18th.    Thats why I need to get the medication tomorrow.. I only have enough medication to last 2 more days & that's just because I don't take 4 tablets every day.  HELP!     Brandy [name] please advise.

## 2020-02-21 NOTE — Telephone Encounter (Signed)
rx sent

## 2020-03-03 DIAGNOSIS — M25551 Pain in right hip: Secondary | ICD-10-CM | POA: Diagnosis not present

## 2020-03-03 DIAGNOSIS — M542 Cervicalgia: Secondary | ICD-10-CM | POA: Diagnosis not present

## 2020-03-09 ENCOUNTER — Encounter: Payer: Self-pay | Admitting: Family Medicine

## 2020-03-09 ENCOUNTER — Telehealth (INDEPENDENT_AMBULATORY_CARE_PROVIDER_SITE_OTHER): Payer: Medicare Other | Admitting: Family Medicine

## 2020-03-09 VITALS — Temp 98.9°F

## 2020-03-09 DIAGNOSIS — J208 Acute bronchitis due to other specified organisms: Secondary | ICD-10-CM | POA: Diagnosis not present

## 2020-03-09 MED ORDER — ALBUTEROL SULFATE HFA 108 (90 BASE) MCG/ACT IN AERS: 2.0000 | INHALATION_SPRAY | Freq: Four times a day (QID) | RESPIRATORY_TRACT | 2 refills | Status: DC | PRN

## 2020-03-09 NOTE — Progress Notes (Signed)
[facility] [address] [place] [address] Dept: [phone] Dept Fax: [phone]  Virtual Video Visit  I connected with [name] on 03/09/20 at  1:30 PM EST by a video enabled telemedicine application and verified that I am speaking with the correct person using two identifiers.  Location patient: Home Location provider: Clinic Persons participating in the virtual visit: Patient, Provider  I discussed the limitations of evaluation and management by telemedicine and the availability of in person appointments. The patient expressed understanding and agreed to proceed.  Chief Complaint  Patient presents with  . Nasal Congestion    Pt states shewent on a trip and returned the 18th days later sore throat, cough chest congestion nasal congestion, discomfort, pt states she has taken two at home covid tests both negative, denies fever. Has tried otc meds to no avail    SUBJECTIVE:  HPI: Brandy [name] is a 70 y.o. female who presents having developed sore throat, nasal congestion with rhinorrhea, and a "croupy" or "asthmatic" cough. She has been self-isolating at home. She is using OTC Alka-Seltzer (both the daytime and nighttime formulas). She denies fever. She did notice some mild dyspnea initially, though this seems better. [name] does not use an inhaler, but had a proir viral illness where it was necessary for her to be treated with a nebulizer. [name] normally keeps active by rowing. She skipped last week. She is wondering if she should return to this tomorrow.  Patient Active Problem List   Diagnosis Date Noted  . Arthritis 06/20/2018  . Numbness and tingling in both hands 06/20/2018  . Rosacea 08/24/2017  . 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 Hyperlipidemia   . Idiopathic hypersomnia   .  CAD (coronary artery disease)   . Hypertension    Past Surgical History:  Procedure Laterality Date  . cataract surgery     BIL  . CORONARY ARTERY BYPASS GRAFT  03/21/05   off pump LIMA-LAD  . myomectomy  1981  . reconstructive surgery  1945   lip   . [place]   3 times  . SHOULDER ARTHROSCOPY  2006  . TOTAL ABDOMINAL HYSTERECTOMY  1991  . TOTAL HIP ARTHROPLASTY Left 03/01/2016   Procedure: LEFT TOTAL HIP ARTHROPLASTY ANTERIOR APPROACH;  Surgeon: Paralee Cancel, MD;  Location: [facility];  Service: Orthopedics;  Laterality: Left;  Requests 70 mins  . trach    . VESICOVAGINAL FISTULA CLOSURE W/ TAH     Family History  Problem Relation Age of Onset  . Stroke Mother        died 64  . Aneurysm Mother   . Hypertension Mother   . Kidney disease Mother   . Bipolar disorder Mother   . Lung cancer Father        died 37  . Bipolar disorder Other   . Hypertension Other   . Osteoporosis Maternal Grandmother   . Arthritis Maternal Grandmother   . Alcohol abuse Maternal Grandfather    Social History   Tobacco Use  . Smoking status: Never Smoker  . Smokeless tobacco: Never Used  Vaping Use  . Vaping Use: Never used  Substance Use Topics  . Alcohol use: No  . Drug use: No    Current Outpatient Medications:  .  Acetaminophen (TYLENOL ARTHRITIS PAIN PO), Take by mouth.,  Disp: , Rfl:  .  albuterol (VENTOLIN HFA) 108 (90 Base) MCG/ACT inhaler, Inhale 2 puffs into the lungs every 6 (six) hours as needed for wheezing or shortness of breath., Disp: 8 g, Rfl: 2 .  Cholecalciferol (VITAMIN D3) 1.25 MG (50000 UT) CAPS, Vitamin D3, Disp: , Rfl:  .  dextroamphetamine (DEXTROSTAT) 5 MG tablet, Take 4 tablets (20 mg total) by mouth daily., Disp: 120 tablet, Rfl: 0 .  famotidine (PEPCID) 20 MG tablet, Take 20 mg by mouth daily., Disp: , Rfl:  .  FLUoxetine (PROZAC) 20 MG capsule, Take 1 capsule (20 mg total) by mouth daily., Disp: 90 capsule, Rfl: 3 .  Multiple Vitamins-Minerals  (MULTIPLE VITAMINS/WOMENS) tablet, Take 1 tablet by mouth daily., Disp: , Rfl:  .  rosuvastatin (CRESTOR) 40 MG tablet, Take 1 tablet (40 mg total) by mouth daily., Disp: 90 tablet, Rfl: 3 .  valsartan (DIOVAN) 320 MG tablet, Take 1 tablet (320 mg total) by mouth daily., Disp: 90 tablet, Rfl: 3 .  chlorthalidone (HYGROTON) 25 MG tablet, Take 0.5 tablets (12.5 mg total) by mouth daily., Disp: 45 tablet, Rfl: 3  Current Facility-Administered Medications:  .  ipratropium-albuterol (DUONEB) 0.5-2.5 (3) MG/3ML nebulizer solution 3 mL, 3 mL, Nebulization, Once, Nafziger, Tommi Rumps, NP  Allergies  Allergen Reactions  . Penicillins Anaphylaxis    Has patient had a PCN reaction causing immediate rash, facial/tongue/throat swelling, SOB or lightheadedness with hypotension: Yes Has patient had a PCN reaction causing severe rash involving mucus membranes or skin necrosis: No Has patient had a PCN reaction that required hospitalization Yes Has patient had a PCN reaction occurring within the last 10 years: No If all of the above answers are "NO", then may proceed with Cephalosporin use.  Marland Kitchen Pentazocine Other (See Comments)  . Bextra [Valdecoxib] Hives  . Codeine Nausea And Vomiting  . Lactose Intolerance (Gi) Diarrhea  . Meperidine Hcl Nausea And Vomiting  . Morphine Nausea And Vomiting  . Pentazocine Lactate     Muscle spasms    ROS: See pertinent positives and negatives per HPI.  OBSERVATIONS/OBJECTIVE:  VITALS per patient if applicable: Today's Vitals   03/09/20 1320  Temp: 98.9 F (37.2 C)  TempSrc: Oral   There is no height or weight on file to calculate BMI.   GENERAL: alert, oriented, appears well and in no acute distress  HEENT: atraumatic, conjunctiva clear, no obvious abnormalities on inspection of external nose and ears  NECK: normal movements of the head and neck  LUNGS: on inspection no signs of respiratory distress, breathing rate appears normal, no obvious gross SOB, gasping or  wheezing, no conversational dyspnea. Mild cough noted with a definite wheezy component.  CV: no obvious cyanosis  PSYCH/NEURO: pleasant and cooperative, no obvious depression or anxiety, speech and thought processing grossly intact  ASSESSMENT AND PLAN:  1. Acute viral bronchitis We discussed home management of viral illness. Due to the prior history of the need for a nebulizer treatment with a previosu viral illness, and with the wheezy quality of her cough now, I will prescribe an albuterol inhaler to see if this will provide her some relief with her current illness. We did discuss that her cough may continue for another 1-2 weeks. I also recommended she take another week off of her rowing, to allow her to recover more prior to returning to this activity.  - albuterol (VENTOLIN HFA) 108 (90 Base) MCG/ACT inhaler; Inhale 2 puffs into the lungs every 6 (six) hours as needed for wheezing  or shortness of breath.  Dispense: 8 g; Refill: 2   I discussed the assessment and treatment plan with the patient. The patient was provided an opportunity to ask questions and all were answered. The patient agreed with the plan and demonstrated an understanding of the instructions.   The patient was advised to call back or seek an in-person evaluation if the symptoms worsen or if the condition fails to improve as anticipated.   Haydee Salter, MD

## 2020-03-12 DIAGNOSIS — M25511 Pain in right shoulder: Secondary | ICD-10-CM | POA: Insufficient documentation

## 2020-03-16 DIAGNOSIS — M25511 Pain in right shoulder: Secondary | ICD-10-CM | POA: Diagnosis not present

## 2020-03-20 MED ORDER — VALSARTAN 320 MG PO TABS: 320.0000 mg | ORAL_TABLET | Freq: Every day | ORAL | 3 refills | Status: DC

## 2020-03-24 ENCOUNTER — Ambulatory Visit: Payer: Medicare Other | Admitting: Pulmonary Disease

## 2020-03-26 ENCOUNTER — Other Ambulatory Visit: Payer: Self-pay

## 2020-03-26 ENCOUNTER — Telehealth (INDEPENDENT_AMBULATORY_CARE_PROVIDER_SITE_OTHER): Payer: Medicare Other | Admitting: Family Medicine

## 2020-03-26 ENCOUNTER — Encounter: Payer: Self-pay | Admitting: Family Medicine

## 2020-03-26 VITALS — Ht 60.0 in | Wt 110.0 lb

## 2020-03-26 DIAGNOSIS — J208 Acute bronchitis due to other specified organisms: Secondary | ICD-10-CM

## 2020-03-26 NOTE — Progress Notes (Signed)
[facility] [address] [place] [address] Dept: [phone] Dept Fax: [phone]  Virtual Video Visit  I connected with [name] on 03/26/20 at  8:00 AM EDT by a video enabled telemedicine application and verified that I am speaking with the correct person using two identifiers.  Location patient: Friend's home (house sitting) Location provider: Clinic Persons participating in the virtual visit: Patient, Provider  I discussed the limitations of evaluation and management by telemedicine and the availability of in person appointments. The patient expressed understanding and agreed to proceed.  Chief Complaint  Patient presents with  . Acute Visit    F/u  from last OV (03/09/20),  still having cough and nasal congestion, runny nose.  She has been using Albuterol inhaler and OTC Robitussin CF at night.      SUBJECTIVE:  HPI: Brandy [name] is a 70 y.o. female who presents with ongoing cough. I had seen [name] on 03/09/2020 with sore throat, nasal congestion with rhinorrhea, and a "croupy" or "asthmatic" cough. I prescribed an albuterol inhaler, as she had a history of needing such in the past. She notes she is having continued cough with wheezy component, despite the albuterol inhaler use. She finds this helps only a little. She is also having quite a bit of rhinorrhea, though her mucous is clear. She has added Flonase and a Robitussin Honey preperation that includes acetaminophen, an antihistamine, and a cough suppressant. She is using the Robitussin at night and feels it did help some. She has returned to many activities, including rowing.  Patient Active Problem List   Diagnosis Date Noted  . Shoulder pain, right 03/12/2020  . Arthritis 06/20/2018  . Numbness and tingling in both hands 06/20/2018  . Rosacea 08/24/2017  . 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 Circadian  rhythm sleep disorder, 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  . Hyperlipidemia   . Idiopathic hypersomnia   . CAD (coronary artery disease)   . Hypertension    Past Surgical History:  Procedure Laterality Date  . cataract surgery     BIL  . CORONARY ARTERY BYPASS GRAFT  03/21/05   off pump LIMA-LAD  . myomectomy  1981  . reconstructive surgery  1945   lip   . [place]   3 times  . SHOULDER ARTHROSCOPY  2006  . TOTAL ABDOMINAL HYSTERECTOMY  1991  . TOTAL HIP ARTHROPLASTY Left 03/01/2016   Procedure: LEFT TOTAL HIP ARTHROPLASTY ANTERIOR APPROACH;  Surgeon: Paralee Cancel, MD;  Location: [facility];  Service: Orthopedics;  Laterality: Left;  Requests 70 mins  . trach    . VESICOVAGINAL FISTULA CLOSURE W/ TAH     Family History  Problem Relation Age of Onset  . Stroke Mother        died 29  . Aneurysm Mother   . Hypertension Mother   . Kidney disease Mother   . Bipolar disorder Mother   . Lung cancer Father        died 75  . Bipolar disorder Other   . Hypertension Other   . Osteoporosis Maternal Grandmother   . Arthritis Maternal Grandmother   . Alcohol abuse Maternal Grandfather    Social History   Tobacco Use  . Smoking status: Never Smoker  . Smokeless tobacco: Never Used  Vaping Use  . Vaping Use: Never used  Substance Use Topics  . Alcohol use: No  .  Drug use: No    Current Outpatient Medications:  .  Acetaminophen (TYLENOL ARTHRITIS PAIN PO), Take by mouth., Disp: , Rfl:  .  albuterol (VENTOLIN HFA) 108 (90 Base) MCG/ACT inhaler, Inhale 2 puffs into the lungs every 6 (six) hours as needed for wheezing or shortness of breath., Disp: 8 g, Rfl: 2 .  celecoxib (CELEBREX) 200 MG capsule, celecoxib 200 mg capsule  Take 1 capsule every day by oral route as needed., Disp: , Rfl:  .  chlorthalidone (HYGROTON) 25 MG tablet, Take 0.5 tablets (12.5 mg total) by mouth daily., Disp: 45 tablet, Rfl: 3 .  Cholecalciferol (VITAMIN D3) 1.25 MG (50000 UT) CAPS, Vitamin  D3, Disp: , Rfl:  .  dextroamphetamine (DEXTROSTAT) 5 MG tablet, Take 4 tablets (20 mg total) by mouth daily., Disp: 120 tablet, Rfl: 0 .  famotidine (PEPCID) 20 MG tablet, Take 20 mg by mouth daily., Disp: , Rfl:  .  FLUoxetine (PROZAC) 20 MG capsule, Take 1 capsule (20 mg total) by mouth daily., Disp: 90 capsule, Rfl: 3 .  Multiple Vitamins-Minerals (MULTIPLE VITAMINS/WOMENS) tablet, Take 1 tablet by mouth daily., Disp: , Rfl:  .  Omega-3 Fatty Acids (FISH OIL) 1200 MG CAPS, , Disp: , Rfl:  .  omeprazole (PRILOSEC) 20 MG capsule, omeprazole 20 mg capsule,delayed release, Disp: , Rfl:  .  rosuvastatin (CRESTOR) 40 MG tablet, Take 1 tablet (40 mg total) by mouth daily., Disp: 90 tablet, Rfl: 3 .  valsartan (DIOVAN) 320 MG tablet, Take 1 tablet (320 mg total) by mouth daily., Disp: 90 tablet, Rfl: 3  Allergies  Allergen Reactions  . Penicillins Anaphylaxis    Has patient had a PCN reaction causing immediate rash, facial/tongue/throat swelling, SOB or lightheadedness with hypotension: Yes Has patient had a PCN reaction causing severe rash involving mucus membranes or skin necrosis: No Has patient had a PCN reaction that required hospitalization Yes Has patient had a PCN reaction occurring within the last 10 years: No If all of the above answers are "NO", then may proceed with Cephalosporin use.  Marland Kitchen Pentazocine Other (See Comments)  . Bextra [Valdecoxib] Hives  . Codeine Nausea And Vomiting  . Lactose Intolerance (Gi) Diarrhea  . Meperidine Hcl Nausea And Vomiting  . Morphine Nausea And Vomiting  . Pentazocine Lactate     Muscle spasms    ROS: See pertinent positives and negatives per HPI.  OBSERVATIONS/OBJECTIVE:  VITALS per patient if applicable: Today's Vitals   03/26/20 0809  Weight: 110 lb (49.9 kg)  Height: 5' (1.524 m)   Body mass index is 21.48 kg/m.   GENERAL: Alert, oriented, appears well and in no acute distress  HEENT: Atraumatic, conjunctiva clear, no obvious  abnormalities on inspection of external nose and ears  NECK: Normal movements of the head and neck  LUNGS: On inspection no signs of respiratory distress, breathing rate appears normal, no obvious gross SOB, gasping or wheezing, no conversational dyspnea. Moderate cough with a wheezy quality.  CV: No obvious cyanosis.  PSYCH/NEURO: Pleasant and cooperative, no obvious depression or anxiety, speech and thought processing grossly intact  ASSESSMENT AND PLAN:  1. Acute viral bronchitis With her on going symptoms, I feel Ms. Kimmer will be best served with an in-person appointment so we can perform a physical exam and potentially do a chest x-ray to assess. We will schedule her to be seen tomorrow. She was instructed to continue the use of the albuterol inhaler int he meantime.   I discussed the assessment and treatment  plan with the patient. The patient was provided an opportunity to ask questions and all were answered. The patient agreed with the plan and demonstrated an understanding of the instructions.  Haydee Salter, MD

## 2020-03-27 ENCOUNTER — Ambulatory Visit: Payer: Medicare Other | Admitting: Family Medicine

## 2020-03-31 DIAGNOSIS — K219 Gastro-esophageal reflux disease without esophagitis: Secondary | ICD-10-CM | POA: Diagnosis not present

## 2020-03-31 DIAGNOSIS — R490 Dysphonia: Secondary | ICD-10-CM | POA: Diagnosis not present

## 2020-04-02 ENCOUNTER — Ambulatory Visit (INDEPENDENT_AMBULATORY_CARE_PROVIDER_SITE_OTHER): Payer: Medicare Other | Admitting: Family Medicine

## 2020-04-02 ENCOUNTER — Encounter: Payer: Self-pay | Admitting: Family Medicine

## 2020-04-02 ENCOUNTER — Other Ambulatory Visit: Payer: Self-pay

## 2020-04-02 VITALS — BP 122/70 | HR 60 | Temp 98.0°F | Ht 60.0 in | Wt 110.0 lb

## 2020-04-02 DIAGNOSIS — J40 Bronchitis, not specified as acute or chronic: Secondary | ICD-10-CM | POA: Diagnosis not present

## 2020-04-02 DIAGNOSIS — M5030 Other cervical disc degeneration, unspecified cervical region: Secondary | ICD-10-CM | POA: Diagnosis not present

## 2020-04-02 MED ORDER — FLOVENT HFA 110 MCG/ACT IN AERO: 1.0000 | INHALATION_SPRAY | Freq: Two times a day (BID) | RESPIRATORY_TRACT | 0 refills | Status: DC

## 2020-04-02 NOTE — Progress Notes (Signed)
[facility] [address] [place] [address] Dept: [phone] Dept Fax: [phone]  Acute Office Visit  Subjective:    Patient ID: Brandy [name], female    DOB: [birth year], 70 y.o..   MRN: [id number]  Chief Complaint  Patient presents with  . Follow-up    F/u persistent cough.    History of Present Illness:  Patient is in today for re-evaluation of her cough. I had seen Brandy [name] for a video visit on 03/09/2020 with sore throat, nasal congestion with rhinorrhea, and a "croupy" or "asthmatic" cough. I prescribed an albuterol inhaler, as she had a history of needing such in the past. I saw her back for a 2nd video visit on 03/26/2020 having continued cough with wheezy component, despite the albuterol inhaler use. She has added Flonase and a Robitussin Honey preperation that includes acetaminophen, an antihistamine, and a cough suppressant. I recommended at that point that she be seen in clinic for an in-person evaluation.  Since her last visit, she does feel there has been some mild improvement in her cough, and moderate improvement in her rhinorrhea. She was seen yesterday by her ENT physician related to a chronic hoarseness issue. 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 She is on Prilosec 20 mg bid for this. Brandy [name] also notes she is having some facial laser treatments tomorrow by her aesthetician. She has been advised to be off of systemic steroids prior to and for a week after her procedure.  Past Medical History: Patient Active Problem List   Diagnosis Date Noted  . Shoulder pain, right 03/12/2020  . Arthritis 06/20/2018  . Numbness and tingling in both hands 06/20/2018  . Rosacea 08/24/2017  . 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 Hyperlipidemia   . Idiopathic hypersomnia    . CAD (coronary artery disease)   . Hypertension    Past Surgical History:  Procedure Laterality Date  . cataract surgery     BIL  . CORONARY ARTERY BYPASS GRAFT  03/21/05   off pump LIMA-LAD  . myomectomy  1981  . reconstructive surgery  1945   lip   . [place]   3 times  . SHOULDER ARTHROSCOPY  2006  . TOTAL ABDOMINAL HYSTERECTOMY  1991  . TOTAL HIP ARTHROPLASTY Left 03/01/2016   Procedure: LEFT TOTAL HIP ARTHROPLASTY ANTERIOR APPROACH;  Surgeon: Paralee Cancel, MD;  Location: [facility];  Service: Orthopedics;  Laterality: Left;  Requests 70 mins  . trach    . VESICOVAGINAL FISTULA CLOSURE W/ TAH     Family History  Problem Relation Age of Onset  . Stroke Mother        died 44  . Aneurysm Mother   . Hypertension Mother   . Kidney disease Mother   . Bipolar disorder Mother   . Lung cancer Father        died 43  . Bipolar disorder Other   . Hypertension Other   . Osteoporosis Maternal Grandmother   . Arthritis Maternal Grandmother   . Alcohol abuse Maternal Grandfather    Outpatient Medications Prior to Visit  Medication Sig Dispense Refill  . Acetaminophen (TYLENOL ARTHRITIS PAIN PO) Take by mouth.    Marland Kitchen albuterol (VENTOLIN HFA) 108 (90 Base) MCG/ACT inhaler Inhale 2 puffs into the lungs every 6 (six) hours as needed  for wheezing or shortness of breath. 8 g 2  . celecoxib (CELEBREX) 200 MG capsule celecoxib 200 mg capsule  Take 1 capsule every day by oral route as needed.    . chlorthalidone (HYGROTON) 25 MG tablet Take 0.5 tablets (12.5 mg total) by mouth daily. 45 tablet 3  . Cholecalciferol (VITAMIN D3) 1.25 MG (50000 UT) CAPS Vitamin D3    . dextroamphetamine (DEXTROSTAT) 5 MG tablet Take 4 tablets (20 mg total) by mouth daily. 120 tablet 0  . famotidine (PEPCID) 20 MG tablet Take 20 mg by mouth daily.    Marland Kitchen FLUoxetine (PROZAC) 20 MG capsule Take 1 capsule (20 mg total) by mouth daily. 90 capsule 3  . Multiple Vitamins-Minerals (MULTIPLE  VITAMINS/WOMENS) tablet Take 1 tablet by mouth daily.    . Omega-3 Fatty Acids (FISH OIL) 1200 MG CAPS     . omeprazole (PRILOSEC) 20 MG capsule omeprazole 20 mg capsule,delayed release    . rosuvastatin (CRESTOR) 40 MG tablet Take 1 tablet (40 mg total) by mouth daily. 90 tablet 3  . valsartan (DIOVAN) 320 MG tablet Take 1 tablet (320 mg total) by mouth daily. 90 tablet 3   No facility-administered medications prior to visit.   Allergies  Allergen Reactions  . Penicillins Anaphylaxis    Has patient had a PCN reaction causing immediate rash, facial/tongue/throat swelling, SOB or lightheadedness with hypotension: Yes Has patient had a PCN reaction causing severe rash involving mucus membranes or skin necrosis: No Has patient had a PCN reaction that required hospitalization Yes Has patient had a PCN reaction occurring within the last 10 years: No If all of the above answers are "NO", then may proceed with Cephalosporin use.  Marland Kitchen Pentazocine Other (See Comments)  . Bextra [Valdecoxib] Hives  . Codeine Nausea And Vomiting  . Lactose Intolerance (Gi) Diarrhea  . Meperidine Hcl Nausea And Vomiting  . Morphine Nausea And Vomiting  . Pentazocine Lactate     Muscle spasms      Objective:   Today's Vitals   04/02/20 1017  BP: 122/70  Pulse: 60  Temp: 98 F (36.7 C)  TempSrc: Temporal  SpO2: 99%  Weight: 110 lb (49.9 kg)  Height: 5' (1.524 m)   Body mass index is 21.48 kg/m.   General: Well developed, well nourished. No acute distress. HEENT: Normocephalic, non-traumatic. Conjunctiva clear. External ears normal. EAC and TMs normal    bilaterally. Mucous membranes moist. Oropharynx clear. Good dentition. Neck: Supple. No lymphadenopathy. No thyromegaly. Lungs: Mild musical wheezes heard in the left base and left mid lung. No wheezing appreciated with forced   exhalation while lying prone. CV: RRR without murmurs or rubs. Pulses 2+ bilaterally. Psych: Alert and oriented. Normal mood  and affect.  There are no preventive care reminders to display for this patient.     Assessment & Plan:   1. Bronchitis with acute wheezing Brandy [name] has a history fo past "asthmatic bronchitis". I suspect she has a degree of mild intermittent asthma with a viral trigger. She is currently using albuterol. I would typically add an oral steroid, but in light of her pending laser skin treatment, we discussed using an steroid inhaler instead. If not improving by 7 days after her laser treatment, I recommended she reach back to me and we will consider a course of oral steroids.  - fluticasone (FLOVENT HFA) 110 MCG/ACT inhaler; Inhale 1 puff into the lungs in the morning and at bedtime.  Dispense: 1 each; Refill: 0  Annie Main  Freddie Apley, MD

## 2020-04-03 NOTE — Telephone Encounter (Signed)
Dr. Sood, please see mychart message sent by pt and advise. °

## 2020-04-06 MED ORDER — DEXTROAMPHETAMINE SULFATE 5 MG PO TABS: 20.0000 mg | ORAL_TABLET | Freq: Every day | ORAL | 0 refills | Status: DC

## 2020-04-06 NOTE — Telephone Encounter (Signed)
Script sent  

## 2020-04-10 ENCOUNTER — Telehealth: Payer: Self-pay | Admitting: Family Medicine

## 2020-04-10 ENCOUNTER — Encounter: Payer: Self-pay | Admitting: Family Medicine

## 2020-04-10 NOTE — Telephone Encounter (Signed)
Pt was no show for appt 03/27/2020 due to car trouble but came in 04/02/20 (acute with Dr. Gena Fray, PCP Dr. Bryan Lemma) 1st occurrence. Fee waived. Letter mailed.

## 2020-04-15 ENCOUNTER — Other Ambulatory Visit: Payer: Self-pay

## 2020-04-15 ENCOUNTER — Ambulatory Visit (INDEPENDENT_AMBULATORY_CARE_PROVIDER_SITE_OTHER): Payer: Medicare Other | Admitting: Pulmonary Disease

## 2020-04-15 ENCOUNTER — Encounter: Payer: Self-pay | Admitting: Pulmonary Disease

## 2020-04-15 VITALS — BP 126/60 | HR 79 | Temp 98.0°F | Ht 60.0 in | Wt 111.6 lb

## 2020-04-15 DIAGNOSIS — R0683 Snoring: Secondary | ICD-10-CM

## 2020-04-15 DIAGNOSIS — G47419 Narcolepsy without cataplexy: Secondary | ICD-10-CM

## 2020-04-15 DIAGNOSIS — M542 Cervicalgia: Secondary | ICD-10-CM | POA: Diagnosis not present

## 2020-04-15 NOTE — Patient Instructions (Signed)
Will arrange for in lab sleep study Will call to arrange for follow up after sleep study reviewed  

## 2020-04-15 NOTE — Progress Notes (Signed)
[facility]  Chief Complaint  Patient presents with  . Follow-up    Continues to be sleepy during the day    Constitutional:  BP 126/60 (BP Location: Left Arm, Cuff Size: Normal)   Pulse 79   Temp 98 F (36.7 C) (Temporal)   Ht 5' (1.524 m)   Wt 111 lb 9.6 oz (50.6 kg)   LMP 01/10/1989 (Approximate)   SpO2 100% Comment: Room air  BMI 21.80 kg/m   Past Medical History:  OA, CAD, Bipolar with depression, Migraine HA, HLD, HTN, MVP  Past Surgical History:  She  has a past surgical history that includes trach; myomectomy (1981); Vesicovaginal fistula closure w/ TAH; Rotator cuff repair (1998); Coronary artery bypass graft (03/21/05); Shoulder arthroscopy (2006); Total abdominal hysterectomy (1991); cataract surgery; Total hip arthroplasty (Left, 03/01/2016); and reconstructive surgery (1945).  Brief Summary:  Brandy [name] is a 70 y.o. 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      Subjective:   She is continuing to have trouble with daytime sleepiness.  She can get about 8 hrs sleep at night, but then sleep another 3 to 4 hours during the day.  She is okay when she stays active, but falls asleep whenever she is sitting quiet.  This is especially so after she eats lunch or dinner.  She has appointment with voice disorders center with [facility] to assess hoarseness.  She has been followed by Dr. Benjamine Mola, and was told she had laryngeal injury due to reflux.  She is a restless sleeper.  She will wake up with a cough and choking sensation at times.  She had viral respiratory infection over the winter.  She used flovent and albuterol then.  She feels better and no longer using inhaler therapy.  Physical Exam:   Appearance - well kempt   ENMT - no sinus tenderness, no oral exudate, no LAN, Mallampati 3 airway, no stridor  Respiratory - equal breath sounds bilaterally, no wheezing or rales  CV - s1s2 regular rate and rhythm, no murmurs  Ext  - no clubbing, no edema  Skin - no rashes  Psych - normal mood and affect   Sleep Tests:   PSG 04/30/07 >>AHI 0.5, PLMI 5.2   MSLT 04/30/07 >> mean sleep latency 4 min, 5/5 naps, 0/5 SOREM  Cardiac Tests:   Echo 05/01/19 >> EF 70 to 75%  Social History:  She  reports that she has never smoked. She has never used smokeless tobacco. She reports that she does not drink alcohol and does not use drugs.  Family History:  Her family history includes Alcohol abuse in her maternal grandfather; Aneurysm in her mother; Arthritis in her maternal grandmother; Bipolar disorder in her mother and another family member; Hypertension in her mother and another family member; Kidney disease in her mother; Lung cancer in her father; Osteoporosis in her maternal grandmother; Stroke in her mother.     Assessment/Plan:   Narcolepsy w/o cataplexy. - she has persistent daytime sleepiness and now reports symptoms suggestive of sleep apnea - will continue 15 mg dextroamphetamine in the morning, and add 5 mg as needed in the afternoon - will arrange for nocturnal polysomnogram to assess for sleep apnea and sleep disruption - depending on sleep study results will determine if she needs therapy for sleep apnea, she needs sleep aide medication, and/or she needs to transition to alternative wakefulness medication  GERD with laryngeal irritation. - followed by Dr. Benjamine Mola with ENT -  has upcoming appointment with voice disorder center at [facility]  Asthmatic bronchitis. - after recent viral respiratory infection - resolved - will remove inhalers from her medication list  Depression. - she is followed by Dr. [name]  CAD s/p CABG, HTN, HLD. - followed by Dr. Shelva Majestic with [facility]  Time Spent Involved in Patient Care on Day of Examination:  36 minutes  Follow up:  Patient Instructions  Will arrange for in lab sleep study Will call to arrange for follow up after sleep study  reviewed    Medication List:   Allergies as of 04/15/2020      Reactions   Penicillins Anaphylaxis   Has patient had a PCN reaction causing immediate rash, facial/tongue/throat swelling, SOB or lightheadedness with hypotension: Yes Has patient had a PCN reaction causing severe rash involving mucus membranes or skin necrosis: No Has patient had a PCN reaction that required hospitalization Yes Has patient had a PCN reaction occurring within the last 10 years: No If all of the above answers are "NO", then may proceed with Cephalosporin use.   Pentazocine Other (See Comments)   Bextra [valdecoxib] Hives   Codeine Nausea And Vomiting   Lactose Intolerance (gi) Diarrhea   Meperidine Hcl Nausea And Vomiting   Morphine Nausea And Vomiting   Pentazocine Lactate    Muscle spasms       Medication List       Accurate as of April 15, 2020 10:03 AM. If you have any questions, ask your nurse or doctor.        STOP taking these medications   albuterol 108 (90 Base) MCG/ACT inhaler Commonly known as: VENTOLIN HFA Stopped by: [name], MD   Flovent HFA 110 MCG/ACT inhaler Generic drug: fluticasone Stopped by: [name], MD     TAKE these medications   celecoxib 200 MG capsule Commonly known as: CELEBREX celecoxib 200 mg capsule  Take 1 capsule every day by oral route as needed.   chlorthalidone 25 MG tablet Commonly known as: HYGROTON Take 0.5 tablets (12.5 mg total) by mouth daily.   dextroamphetamine 5 MG tablet Commonly known as: DEXTROSTAT Take 4 tablets (20 mg total) by mouth daily.   famotidine 20 MG tablet Commonly known as: PEPCID Take 20 mg by mouth daily.   Fish Oil 1200 MG Caps   FLUoxetine 20 MG capsule Commonly known as: PROZAC Take 1 capsule (20 mg total) by mouth daily.   Multiple Vitamins/Womens tablet Take 1 tablet by mouth daily.   omeprazole 20 MG capsule Commonly known as: PRILOSEC omeprazole 20 mg capsule,delayed release   rosuvastatin 40  MG tablet Commonly known as: CRESTOR Take 1 tablet (40 mg total) by mouth daily.   TYLENOL ARTHRITIS PAIN PO Take by mouth.   valsartan 320 MG tablet Commonly known as: DIOVAN Take 1 tablet (320 mg total) by mouth daily.   Vitamin D3 1.25 MG (50000 UT) Caps Vitamin D3       Signature:  [name], MD [place] Pager - [phone] 04/15/2020, 10:03 AM

## 2020-04-18 DIAGNOSIS — Z23 Encounter for immunization: Secondary | ICD-10-CM | POA: Diagnosis not present

## 2020-04-22 DIAGNOSIS — J385 Laryngeal spasm: Secondary | ICD-10-CM | POA: Diagnosis not present

## 2020-04-22 DIAGNOSIS — J383 Other diseases of vocal cords: Secondary | ICD-10-CM | POA: Diagnosis not present

## 2020-04-22 DIAGNOSIS — R490 Dysphonia: Secondary | ICD-10-CM | POA: Diagnosis not present

## 2020-05-05 ENCOUNTER — Encounter (HOSPITAL_BASED_OUTPATIENT_CLINIC_OR_DEPARTMENT_OTHER): Payer: Self-pay

## 2020-05-08 ENCOUNTER — Encounter (HOSPITAL_BASED_OUTPATIENT_CLINIC_OR_DEPARTMENT_OTHER): Payer: Self-pay

## 2020-05-11 ENCOUNTER — Telehealth: Payer: Self-pay | Admitting: Pulmonary Disease

## 2020-05-11 ENCOUNTER — Encounter (HOSPITAL_BASED_OUTPATIENT_CLINIC_OR_DEPARTMENT_OTHER): Payer: Self-pay

## 2020-05-11 MED ORDER — DEXTROAMPHETAMINE SULFATE 5 MG PO TABS: 20.0000 mg | ORAL_TABLET | Freq: Every day | ORAL | 0 refills | Status: DC

## 2020-05-11 NOTE — Telephone Encounter (Signed)
Just following up on my Apr 26 script request for 120 dextroamphetamine 5mg  tablets. My previous prescription was dispensed on Mar. 29th.  I only have 1 tablet left for tomorrow.  My pharmacy is [facility] [place] Kind regards,  Tanikka Bresnan

## 2020-05-11 NOTE — Telephone Encounter (Signed)
Pt aware that her rx was sent. Nothing further needed.

## 2020-05-11 NOTE — Telephone Encounter (Signed)
Pt states she has been out of dextroamphetamine for 3 days now. Dr. Halford Chessman will you please send refill for pt?

## 2020-05-11 NOTE — Telephone Encounter (Signed)
Refill signed.

## 2020-05-13 DIAGNOSIS — J383 Other diseases of vocal cords: Secondary | ICD-10-CM | POA: Diagnosis not present

## 2020-06-01 DIAGNOSIS — K219 Gastro-esophageal reflux disease without esophagitis: Secondary | ICD-10-CM | POA: Diagnosis not present

## 2020-06-01 DIAGNOSIS — R490 Dysphonia: Secondary | ICD-10-CM | POA: Diagnosis not present

## 2020-06-10 ENCOUNTER — Encounter (HOSPITAL_BASED_OUTPATIENT_CLINIC_OR_DEPARTMENT_OTHER): Payer: Self-pay

## 2020-06-10 DIAGNOSIS — J383 Other diseases of vocal cords: Secondary | ICD-10-CM | POA: Diagnosis not present

## 2020-06-11 ENCOUNTER — Other Ambulatory Visit: Payer: Self-pay

## 2020-06-11 ENCOUNTER — Ambulatory Visit (HOSPITAL_BASED_OUTPATIENT_CLINIC_OR_DEPARTMENT_OTHER): Payer: Medicare Other | Attending: Pulmonary Disease | Admitting: Pulmonary Disease

## 2020-06-11 DIAGNOSIS — G4761 Periodic limb movement disorder: Secondary | ICD-10-CM | POA: Insufficient documentation

## 2020-06-11 DIAGNOSIS — R0683 Snoring: Secondary | ICD-10-CM | POA: Insufficient documentation

## 2020-06-11 DIAGNOSIS — G47419 Narcolepsy without cataplexy: Secondary | ICD-10-CM | POA: Diagnosis not present

## 2020-06-15 ENCOUNTER — Telehealth: Payer: Self-pay | Admitting: Pulmonary Disease

## 2020-06-15 MED ORDER — DEXTROAMPHETAMINE SULFATE 5 MG PO TABS: 20.0000 mg | ORAL_TABLET | Freq: Every day | ORAL | 0 refills | Status: DC

## 2020-06-15 NOTE — Telephone Encounter (Signed)
Pt aware that her rx was sent

## 2020-06-15 NOTE — Telephone Encounter (Signed)
Pt requesting refill for Dextrostat. 5mg  Take 4 tablets daily. Last refilled 05/11/20 for #120 tablets.  Dr Halford Chessman please advise.

## 2020-06-15 NOTE — Telephone Encounter (Signed)
Refill sent.

## 2020-06-16 ENCOUNTER — Telehealth: Payer: Self-pay | Admitting: Pulmonary Disease

## 2020-06-16 DIAGNOSIS — R0683 Snoring: Secondary | ICD-10-CM | POA: Diagnosis not present

## 2020-06-16 NOTE — Procedures (Signed)
    Patient Name: Brandy [name], Brandy [name] Date: 06/11/2020 Gender: Female D.O.B: 1950/11/07 Age (years): 89 Referring Provider: [name] MD, ABSM Height (inches): 60 Interpreting Physician: [name] MD, ABSM Weight (lbs): 110 RPSGT: Jorge Ny BMI: 21 MRN: [id number] Neck Size: 13.00  CLINICAL INFORMATION Sleep Study Type: NPSG  Indication for sleep study: Daytime Fatigue, Hypertension, Insomnia, Non-refreshing Sleep, Snoring  Epworth Sleepiness Score: 15  SLEEP STUDY TECHNIQUE As per the AASM Manual for the Scoring of Sleep and Associated Events v2.3 (April 2016) with a hypopnea requiring 4% desaturations.  The channels recorded and monitored were frontal, central and occipital EEG, electrooculogram (EOG), submentalis EMG (chin), nasal and oral airflow, thoracic and abdominal wall motion, anterior tibialis EMG, snore microphone, electrocardiogram, and pulse oximetry.  MEDICATIONS Medications self-administered by patient taken the night of the study : TYLENOL ARTHRITIS, VALSARTAN  SLEEP ARCHITECTURE The study was initiated at 10:25:18 PM and ended at 5:38:14 AM.  Sleep onset time was 52.3 minutes and the sleep efficiency was 79.0%%. The total sleep time was 342 minutes.  Stage REM latency was 319.0 minutes.  The patient spent 6.0%% of the night in stage N1 sleep, 82.0%% in stage N2 sleep, 0.0%% in stage N3 and 12% in REM.  Alpha intrusion was absent.  Supine sleep was 37.25%.  RESPIRATORY PARAMETERS The overall apnea/hypopnea index (AHI) was 0.7 per hour. There were 4 total apneas, including 0 obstructive, 4 central and 0 mixed apneas. There were 0 hypopneas and 1 RERAs.  The AHI during Stage REM sleep was 4.4 per hour.  AHI while supine was 1.4 per hour.  The mean oxygen saturation was 94.7%. The minimum SpO2 during sleep was 85.0%.  moderate snoring was noted during this study.  CARDIAC DATA The 2 lead EKG demonstrated sinus rhythm. The mean  heart rate was 58.0 beats per minute. Other EKG findings include: None.  LEG MOVEMENT DATA The total PLMS were 0 with a resulting PLMS index of 0.0. Associated arousal with leg movement index was 22.8 .  IMPRESSIONS - No significant obstructive sleep apnea occurred during this study (AHI = 0.7/h). - No significant central sleep apnea occurred during this study (CAI = 0.7/h). - Mild oxygen desaturation was noted during this study (Min O2 = 85.0%).  Spent 4 minutes of test time with SpO2 < 88%.  Supplemental oxygen was not used during this study. - The patient snored with moderate snoring volume. - No cardiac abnormalities were noted during this study. - Clinically significant periodic limb movements did not occur during sleep. Associated arousals were significant.  DIAGNOSIS - Periodic Limb Movement During Sleep. - Snoring.  RECOMMENDATIONS - Assess for the presence of restless leg syndrome. - Avoid alcohol, sedatives and other CNS depressants that may worsen sleep apnea and disrupt normal sleep architecture. - Sleep hygiene should be reviewed to assess factors that may improve sleep quality.  [Electronically signed] 06/16/2020 01:24 PM  [name] MD, [place], American Board of Sleep Medicine   NPI: [id number]

## 2020-06-16 NOTE — Telephone Encounter (Signed)
PSG 06/11/20 >> AHI 0.7, SpO2 low 85%, PLMI 22.8.    Please let her know that her sleep study showed snoring, but no significant sleep apnea.  She did have frequent leg movements while asleep and this can be associated with restless leg syndrome.  Please schedule ROV with me to review in more detail.

## 2020-06-16 NOTE — Telephone Encounter (Signed)
Called and went over sleep study results per Dr Halford Chessman with patient. All questions answered and patient expressed full understanding. Scheduled ROV for Friday 07/24/20 at 11am with Dr Halford Chessman at the [facility] office. Nothing further needed at this time.

## 2020-07-14 ENCOUNTER — Telehealth: Payer: Self-pay | Admitting: Pulmonary Disease

## 2020-07-14 NOTE — Telephone Encounter (Signed)
Called and spoke with patient who is calling because she needs refill for Dextrostat 5 mg. Pharmacy is [facility]    Dr. Halford Chessman please advise. I have pended order below

## 2020-07-15 MED ORDER — DEXTROAMPHETAMINE SULFATE 5 MG PO TABS: 20.0000 mg | ORAL_TABLET | Freq: Every day | ORAL | 0 refills | Status: DC

## 2020-07-15 NOTE — Telephone Encounter (Signed)
Called and spoke with pt letting her know that [facility] refilled her Rx for her and she verbalized understanding. Nothing further needed.

## 2020-07-15 NOTE — Telephone Encounter (Signed)
Script signed.

## 2020-07-16 ENCOUNTER — Ambulatory Visit: Payer: Medicare Other | Admitting: Cardiovascular Disease

## 2020-07-22 ENCOUNTER — Other Ambulatory Visit: Payer: Self-pay | Admitting: Psychiatry

## 2020-07-22 DIAGNOSIS — F39 Unspecified mood [affective] disorder: Secondary | ICD-10-CM

## 2020-07-24 ENCOUNTER — Ambulatory Visit (INDEPENDENT_AMBULATORY_CARE_PROVIDER_SITE_OTHER): Payer: Medicare Other | Admitting: Pulmonary Disease

## 2020-07-24 ENCOUNTER — Encounter: Payer: Self-pay | Admitting: Pulmonary Disease

## 2020-07-24 ENCOUNTER — Other Ambulatory Visit: Payer: Self-pay

## 2020-07-24 VITALS — BP 120/68 | HR 73 | Temp 98.1°F | Ht 60.0 in | Wt 109.2 lb

## 2020-07-24 DIAGNOSIS — G4700 Insomnia, unspecified: Secondary | ICD-10-CM | POA: Diagnosis not present

## 2020-07-24 DIAGNOSIS — G47419 Narcolepsy without cataplexy: Secondary | ICD-10-CM

## 2020-07-24 MED ORDER — RAMELTEON 8 MG PO TABS: 8.0000 mg | ORAL_TABLET | Freq: Every day | ORAL | 5 refills | Status: DC

## 2020-07-24 NOTE — Progress Notes (Signed)
[facility]  Chief Complaint  Patient presents with   Follow-up    Patient reports to go over results of sleep study.      Constitutional:  BP 120/68 (BP Location: Left Arm, Patient Position: Sitting, Cuff Size: Normal)   Pulse 73   Temp 98.1 F (36.7 C) (Oral)   Ht 5' (1.524 m)   Wt 109 lb 3.2 oz (49.5 kg)   LMP 01/10/1989 (Approximate)   SpO2 98%   BMI 21.33 kg/m   Past Medical History:  OA, CAD, Bipolar with depression, Migraine HA, HLD, HTN, MVP  Past Surgical History:  She  has a past surgical history that includes trach; myomectomy (1981); Vesicovaginal fistula closure w/ TAH; Rotator cuff repair (1998); Coronary artery bypass graft (03/21/05); Shoulder arthroscopy (2006); Total abdominal hysterectomy (1991); cataract surgery; Total hip arthroplasty (Left, 03/01/2016); and reconstructive surgery (1945).  Brief Summary:  Brandy [name] is a 70 y.o. 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      Subjective:   She had sleep study.  No evidence for sleep apnea.  She uses 5 to 10 mg adderall in the afternoon intermittently.  Still gets sleep during the day.  She is having trouble staying asleep at night.  Physical Exam:   Appearance - well kempt   ENMT - no sinus tenderness, no oral exudate, no LAN, Mallampati 3 airway, no stridor  Respiratory - equal breath sounds bilaterally, no wheezing or rales  CV - s1s2 regular rate and rhythm, no murmurs  Ext - no clubbing, no edema  Skin - no rashes  Psych - normal mood and affect   Sleep Tests:  PSG 04/30/07 >> AHI 0.5, PLMI 5.2  MSLT 04/30/07 >> mean sleep latency 4 min, 5/5 naps, 0/5 SOREM PSG 06/11/20 >> AHI 0.7, SpO2 low 85%, PLMI 22.8.  Cardiac Tests:  Echo 05/01/19 >> EF 70 to 75%  Social History:  She  reports that she has never smoked. She has never used smokeless tobacco. She reports that she does not drink alcohol and does not use drugs.  Family History:   Her family history includes Alcohol abuse in her maternal grandfather; Aneurysm in her mother; Arthritis in her maternal grandmother; Bipolar disorder in her mother and another family member; Hypertension in her mother and another family member; Kidney disease in her mother; Lung cancer in her father; Osteoporosis in her maternal grandmother; Stroke in her mother.     Assessment/Plan:   Narcolepsy w/o cataplexy. 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 - seen by Dr. Mila Homer at voice disorders center in [facility] - followed by speech therapy at [facility]   Depression. - she is followed by Dr. [name]  CAD s/p CABG, HTN, HLD. - followed by Dr. Shelva Majestic with [facility]  Time Spent Involved in Patient Care on Day of Examination:  23 minutes  Follow up:   Patient Instructions  Ramelteon 8 mg nightly   Follow up in 2 months  Medication List:   Allergies as of 07/24/2020       Reactions   Penicillins Anaphylaxis   Has patient had a PCN  reaction causing immediate rash, facial/tongue/throat swelling, SOB or lightheadedness with hypotension: Yes Has patient had a PCN reaction causing severe rash involving mucus membranes or skin necrosis: No Has patient had a PCN reaction that required hospitalization Yes Has patient had a PCN reaction occurring within the last 10 years: No If all of the above answers are "NO", then may proceed with Cephalosporin use.   Pentazocine Other (See Comments)   Bextra [valdecoxib] Hives   Codeine Nausea And Vomiting   Lactose Intolerance (gi) Diarrhea   Meperidine Hcl Nausea And Vomiting   Morphine Nausea And  Vomiting   Pentazocine Lactate    Muscle spasms         Medication List        Accurate as of July 24, 2020 11:41 AM. If you have any questions, ask your nurse or doctor.          celecoxib 200 MG capsule Commonly known as: CELEBREX celecoxib 200 mg capsule  Take 1 capsule every day by oral route as needed.   chlorthalidone 25 MG tablet Commonly known as: HYGROTON Take 0.5 tablets (12.5 mg total) by mouth daily.   dextroamphetamine 5 MG tablet Commonly known as: DEXTROSTAT Take 4 tablets (20 mg total) by mouth daily.   famotidine 20 MG tablet Commonly known as: PEPCID Take 20 mg by mouth daily.   Fish Oil 1200 MG Caps   FLUoxetine 20 MG capsule Commonly known as: PROZAC TAKE ONE CAPSULE BY MOUTH DAILY   Multiple Vitamins/Womens tablet Take 1 tablet by mouth daily.   omeprazole 20 MG capsule Commonly known as: PRILOSEC omeprazole 20 mg capsule,delayed release   ramelteon 8 MG tablet Commonly known as: ROZEREM Take 1 tablet (8 mg total) by mouth at bedtime. Started by: [name], MD   rosuvastatin 40 MG tablet Commonly known as: CRESTOR Take 1 tablet (40 mg total) by mouth daily.   TYLENOL ARTHRITIS PAIN PO Take by mouth.   valsartan 320 MG tablet Commonly known as: DIOVAN Take 1 tablet (320 mg total) by mouth daily.   Vitamin D3 1.25 MG (50000 UT) Caps Vitamin D3        Signature:  [name], MD [place] Pager - [phone] 07/24/2020, 11:41 AM

## 2020-07-24 NOTE — Patient Instructions (Signed)
Ramelteon 8 mg nightly   Follow up in 2 months

## 2020-07-29 DIAGNOSIS — G8929 Other chronic pain: Secondary | ICD-10-CM | POA: Insufficient documentation

## 2020-08-20 ENCOUNTER — Other Ambulatory Visit: Payer: Self-pay | Admitting: Pulmonary Disease

## 2020-08-20 DIAGNOSIS — M1611 Unilateral primary osteoarthritis, right hip: Secondary | ICD-10-CM | POA: Diagnosis not present

## 2020-08-20 DIAGNOSIS — M25551 Pain in right hip: Secondary | ICD-10-CM | POA: Diagnosis not present

## 2020-08-20 MED ORDER — DEXTROAMPHETAMINE SULFATE 5 MG PO TABS: 20.0000 mg | ORAL_TABLET | Freq: Every day | ORAL | 0 refills | Status: DC

## 2020-08-20 NOTE — Telephone Encounter (Signed)
Patient a message this morning requesting a refill for Dextroamphetamine '5mg'$  to [facility], [place]  Last refill- 07/15/20 Dextroamphetamine 5 mg, take 4 tabs daily, #120, no refills  Message routed to Dr. Halford Chessman to advise

## 2020-08-20 NOTE — Telephone Encounter (Signed)
Call made to patient, confirmed DOB. Requesting refill of Dextrostat.   Last filled:07/15/20 Last OV: 07/24/20  VS please advise. Medication has been pended.

## 2020-08-28 ENCOUNTER — Encounter: Payer: Medicare Other | Admitting: Family Medicine

## 2020-08-31 ENCOUNTER — Other Ambulatory Visit: Payer: Self-pay | Admitting: Cardiovascular Disease

## 2020-09-04 NOTE — Telephone Encounter (Signed)
Medication has been filled.   Nothing further needed at this time.

## 2020-09-09 DIAGNOSIS — M25551 Pain in right hip: Secondary | ICD-10-CM | POA: Diagnosis not present

## 2020-09-15 DIAGNOSIS — U071 COVID-19: Secondary | ICD-10-CM | POA: Diagnosis not present

## 2020-09-22 NOTE — Telephone Encounter (Signed)
VS please advise on refill for the pt.  I have pended this in the note.  She stated that she has enough for today and tomorrow.  Thanks  Last seen by you 07/2020

## 2020-09-23 MED ORDER — DEXTROAMPHETAMINE SULFATE 5 MG PO TABS: 20.0000 mg | ORAL_TABLET | Freq: Every day | ORAL | 0 refills | Status: DC

## 2020-09-26 DIAGNOSIS — Z23 Encounter for immunization: Secondary | ICD-10-CM | POA: Diagnosis not present

## 2020-09-28 ENCOUNTER — Ambulatory Visit (INDEPENDENT_AMBULATORY_CARE_PROVIDER_SITE_OTHER): Payer: Medicare Other | Admitting: Pulmonary Disease

## 2020-09-28 ENCOUNTER — Encounter: Payer: Self-pay | Admitting: Pulmonary Disease

## 2020-09-28 ENCOUNTER — Other Ambulatory Visit: Payer: Self-pay

## 2020-09-28 VITALS — BP 124/68 | HR 75 | Temp 98.0°F | Ht 60.0 in | Wt 109.6 lb

## 2020-09-28 DIAGNOSIS — G47419 Narcolepsy without cataplexy: Secondary | ICD-10-CM | POA: Diagnosis not present

## 2020-09-28 DIAGNOSIS — G4700 Insomnia, unspecified: Secondary | ICD-10-CM | POA: Diagnosis not present

## 2020-09-28 NOTE — Patient Instructions (Signed)
Follow up in 6 months 

## 2020-09-28 NOTE — Progress Notes (Signed)
[facility]  Chief Complaint  Patient presents with   Follow-up    Doing well.    Constitutional:  BP 124/68 (BP Location: Left Arm, Cuff Size: Normal)   Pulse 75   Temp 98 F (36.7 C) (Oral)   Ht 5' (1.524 m)   Wt 109 lb 9.6 oz (49.7 kg)   LMP 01/10/1989 (Approximate)   SpO2 99%   BMI 21.40 kg/m   Past Medical History:  OA, CAD, Bipolar with depression, Migraine HA, HLD, HTN, MVP  Past Surgical History:  She  has a past surgical history that includes trach; myomectomy (1981); Vesicovaginal fistula closure w/ TAH; Rotator cuff repair (1998); Coronary artery bypass graft (03/21/05); Shoulder arthroscopy (2006); Total abdominal hysterectomy (1991); cataract surgery; Total hip arthroplasty (Left, 03/01/2016); and reconstructive surgery (1945).  Brief Summary:  Brandy [name] is a 70 y.o. 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      Subjective:   She had her hip injected.  This helped with pain symptoms.  She is sleeping better.  Using ramelteon intermittently.  Helps some but causes hangover effect.  Tries to limit use of afternoon dose of adderall.  Tries to take a nap instead.  She finds staying physically active helps her daytime alertness.  Physical Exam:   Appearance - well kempt   ENMT - no sinus tenderness, no oral exudate, no LAN, Mallampati 3 airway, no stridor  Respiratory - equal breath sounds bilaterally, no wheezing or rales  CV - s1s2 regular rate and rhythm, no murmurs  Ext - no clubbing, no edema  Skin - no rashes  Psych - normal mood and affect   Sleep Tests:  PSG 04/30/07 >> AHI 0.5, PLMI 5.2  MSLT 04/30/07 >> mean sleep latency 4 min, 5/5 naps, 0/5 SOREM PSG 06/11/20 >> AHI 0.7, SpO2 low 85%, PLMI 22.8.  Cardiac Tests:  Echo 05/01/19 >> EF 70 to 75%  Social History:  She  reports that she has never smoked. She has never used smokeless tobacco. She reports that she does not drink alcohol and does  not use drugs.  Family History:  Her family history includes Alcohol abuse in her maternal grandfather; Aneurysm in her mother; Arthritis in her maternal grandmother; Bipolar disorder in her mother and another family member; Hypertension in her mother and another family member; Kidney disease in her mother; Lung cancer in her father; Osteoporosis in her maternal grandmother; Stroke in her mother.     Assessment/Plan:   Narcolepsy w/o cataplexy. 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 - seen by Dr. Mila Homer at voice disorders center in [facility] - followed by speech therapy at [facility]   Depression. - she is followed by Dr. [name]  CAD s/p CABG, HTN, HLD. - followed by Dr. Shelva Majestic with [place]  Time Spent Involved in Patient Care on Day of Examination:  22 minutes  Follow up:   Patient Instructions  Follow up in 6 months  Medication List:   Allergies as of 09/28/2020       Reactions   Penicillins Anaphylaxis   Has patient had a PCN reaction causing immediate rash, facial/tongue/throat swelling, SOB or lightheadedness with hypotension: Yes Has  patient had a PCN reaction causing severe rash involving mucus membranes or skin necrosis: No Has patient had a PCN reaction that required hospitalization Yes Has patient had a PCN reaction occurring within the last 10 years: No If all of the above answers are "NO", then may proceed with Cephalosporin use.   Pentazocine Other (See Comments)   Bextra [valdecoxib] Hives   Codeine Nausea And Vomiting   Lactose Intolerance (gi) Diarrhea   Meperidine Hcl Nausea And Vomiting   Morphine Nausea And Vomiting   Pentazocine Lactate    Muscle spasms         Medication List         Accurate as of September 28, 2020  9:32 AM. If you have any questions, ask your nurse or doctor.          celecoxib 200 MG capsule Commonly known as: CELEBREX celecoxib 200 mg capsule  Take 1 capsule every day by oral route as needed.   chlorthalidone 25 MG tablet Commonly known as: HYGROTON TAKE 1/2 TABLET BY MOUTH DAILY   dextroamphetamine 5 MG tablet Commonly known as: DEXTROSTAT Take 4 tablets (20 mg total) by mouth daily.   famotidine 20 MG tablet Commonly known as: PEPCID Take 20 mg by mouth daily.   Fish Oil 1200 MG Caps   FLUoxetine 20 MG capsule Commonly known as: PROZAC TAKE ONE CAPSULE BY MOUTH DAILY   Multiple Vitamins/Womens tablet Take 1 tablet by mouth daily.   omeprazole 20 MG capsule Commonly known as: PRILOSEC omeprazole 20 mg capsule,delayed release   ramelteon 8 MG tablet Commonly known as: ROZEREM Take 1 tablet (8 mg total) by mouth at bedtime.   rosuvastatin 40 MG tablet Commonly known as: CRESTOR Take 1 tablet (40 mg total) by mouth daily.   TYLENOL ARTHRITIS PAIN PO Take by mouth.   valsartan 320 MG tablet Commonly known as: DIOVAN Take 1 tablet (320 mg total) by mouth daily.   Vitamin D3 1.25 MG (50000 UT) Caps Vitamin D3        Signature:  [name], MD [place] Pager - [phone] 09/28/2020, 9:32 AM

## 2020-10-19 ENCOUNTER — Ambulatory Visit (INDEPENDENT_AMBULATORY_CARE_PROVIDER_SITE_OTHER): Payer: Medicare Other | Admitting: Psychiatry

## 2020-10-19 ENCOUNTER — Encounter: Payer: Self-pay | Admitting: Psychiatry

## 2020-10-19 ENCOUNTER — Other Ambulatory Visit: Payer: Self-pay

## 2020-10-19 DIAGNOSIS — G4710 Hypersomnia, unspecified: Secondary | ICD-10-CM | POA: Diagnosis not present

## 2020-10-19 DIAGNOSIS — F39 Unspecified mood [affective] disorder: Secondary | ICD-10-CM

## 2020-10-19 MED ORDER — FLUOXETINE HCL 20 MG PO CAPS: 20.0000 mg | ORAL_CAPSULE | Freq: Every day | ORAL | 3 refills | Status: DC

## 2020-10-19 NOTE — Progress Notes (Signed)
[name] [id number] 06-04-1950 70 y.o.  Subjective:   Patient ID:  Brandy [name] is a 70 y.o. (DOB August 18, 1950) female.  Chief Complaint:  Chief Complaint  Patient presents with   Follow-up   Episodic mood disorder ([place]    Medication Refill Associated symptoms include arthralgias. Pertinent negatives include no chest pain or weakness.  Brandy [name] presents to the office today for follow-up of mood and above.  seen  10/2018.  Meds were not changed.  She remained on fluoxetine 20 mg daily.  10/21/19 appt with the following noted: Vaccinated. Overall pretty good.  Minor depressive episodes which are brief.  Managing the stress.  Nothing extraordinary.  CC is sleepiness.  Saw neurologist recently and no med change.  Has to nap.  Tolerance to the stimulant.  No mood swings. Physical activity helps. Hyper somnolence a big problem and sleep doc rx dextroamphetamin.  Has therapeutic naps.   But it still helps.  Didn't make her hyperverbal and hyperactive like in the past.  Has been very careful with the dosage. Overall better sleep cycle. No mania since here.  Patient reports stable mood and denies irritable moods. No extremes of depression.   Patient denies any recent difficulty with anxiety.   Denies appetite disturbance.  Patient reports that energy and motivation have been better.  Patient denies any difficulty with concentration.  Patient denies any suicidal ideation. No history of cataplexy. No new concerns.   Plan:  disc risk manic type sx with stimulant but understand it's been needed for alertness. Plan mood has been stable on fluoxetine  20 mgand she does not want to change the dose or add any other medicine.  10/19/2020 appointment with the following noted: Had another nocturnal in lab sleep study.  No OSA, minimal PLMS.  Pain interferes with sleep at time in hip.  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 No matter how she sleeps at night has excessive daytime sleepiness.   If busy can function.  Other wise 2 hour naps.  Exercise rowing 4 times weekly. Not tried [employer]. Injections from Dr. Nelva Bush helped pain.  Slump in mood around labor day.  Probably circumstantial and not long lasting.  No manic. Patient reports stable mood and denies depressed or irritable moods.  Patient denies any recent difficulty with anxiety.  Patient denies difficulty with sleep initiation or maintenance. Denies appetite disturbance.  Patient reports that energy and motivation have been good.  Patient denies any difficulty with concentration.  Patient denies any suicidal ideation. Satisfied with fluoxetine.  Retired 2013.  Works on being active.  Exercises.  Past psych meds:  Fluoxetine,  Lamotrigine 600, Latuda, lithium, duloxetine, Abilify 5, Depakote 1000,  Seroquel sedation, Wellbutrin, Provigil, Celexa 40, Cerefolin NAC,  Trintellix, Trintellix, Viibryd 40, duloxetine, various stimulants,  Restoril, rozerem not not helpful  Review of Systems:  Review of Systems  Cardiovascular:  Negative for chest pain and palpitations.  Musculoskeletal:  Positive for arthralgias.  Neurological:  Negative for tremors and weakness.  Psychiatric/Behavioral:  Positive for sleep disturbance. Negative for agitation, behavioral problems, confusion, decreased concentration, dysphoric mood, hallucinations, self-injury and suicidal ideas. The patient is not nervous/anxious and is not hyperactive.    Medications: I have reviewed the patient's current medications.  Current Outpatient Medications  Medication Sig Dispense Refill   Acetaminophen (TYLENOL ARTHRITIS PAIN PO) Take by mouth.     celecoxib (CELEBREX) 200 MG capsule celecoxib 200 mg capsule  Take 1 capsule every day by oral route as needed.  chlorthalidone (HYGROTON) 25 MG tablet TAKE 1/2 TABLET BY MOUTH DAILY 45 tablet 3   Cholecalciferol (VITAMIN D3) 1.25 MG (50000 UT) CAPS Vitamin D3     dextroamphetamine (DEXTROSTAT) 5 MG tablet Take 4  tablets (20 mg total) by mouth daily. 120 tablet 0   famotidine (PEPCID) 20 MG tablet Take 20 mg by mouth daily.     Multiple Vitamins-Minerals (MULTIPLE VITAMINS/WOMENS) tablet Take 1 tablet by mouth daily.     Omega-3 Fatty Acids (FISH OIL) 1200 MG CAPS      omeprazole (PRILOSEC) 20 MG capsule omeprazole 20 mg capsule,delayed release     valsartan (DIOVAN) 320 MG tablet Take 1 tablet (320 mg total) by mouth daily. 90 tablet 3   FLUoxetine (PROZAC) 20 MG capsule Take 1 capsule (20 mg total) by mouth daily. 90 capsule 3   rosuvastatin (CRESTOR) 40 MG tablet Take 1 tablet (40 mg total) by mouth daily. (Patient not taking: Reported on 10/19/2020) 90 tablet 3   No current facility-administered medications for this visit.    Medication Side Effects: None  Allergies:  Allergies  Allergen Reactions   Penicillins Anaphylaxis    Has patient had a PCN reaction causing immediate rash, facial/tongue/throat swelling, SOB or lightheadedness with hypotension: Yes Has patient had a PCN reaction causing severe rash involving mucus membranes or skin necrosis: No Has patient had a PCN reaction that required hospitalization Yes Has patient had a PCN reaction occurring within the last 10 years: No If all of the above answers are "NO", then may proceed with Cephalosporin use.   Pentazocine Other (See Comments)   Bextra [Valdecoxib] Hives   Codeine Nausea And Vomiting   Lactose Intolerance (Gi) Diarrhea   Meperidine Hcl Nausea And Vomiting   Morphine Nausea And Vomiting   Pentazocine Lactate     Muscle spasms     Past Medical History:  Diagnosis Date   Arthritis    Bipolar 1 disorder (HCC)    CAD (coronary artery disease) 2007   CABG X 1   Depression    controlled   Hay fever    Headache    Hx of migraines   Heart murmur    Hyperlipidemia    Hypertension    Idiopathic hypersomnia    Mitral valve prolapse    PONV (postoperative nausea and vomiting)    Urinary incontinence     Family  History  Problem Relation Age of Onset   Stroke Mother        died 67   Aneurysm Mother    Hypertension Mother    Kidney disease Mother    Bipolar disorder Mother    Lung cancer Father        died 65   Bipolar disorder Other    Hypertension Other    Osteoporosis Maternal Grandmother    Arthritis Maternal Grandmother    Alcohol abuse Maternal Grandfather     Social History   Socioeconomic History   Marital status: Divorced    Spouse name: Not on file   Number of children: Not on file   Years of education: Not on file   Highest education level: Not on file  Occupational History   Occupation: retired  Tobacco Use   Smoking status: Never   Smokeless tobacco: Never  Vaping Use   Vaping Use: Never used  Substance and Sexual Activity   Alcohol use: No   Drug use: No   Sexual activity: Yes  Other Topics Concern   Not on file  Social History Narrative   Not on file   Social Determinants of Health   Financial Resource Strain: Low Risk    Difficulty of Paying Living Expenses: Not hard at all  Food Insecurity: No Food Insecurity   Worried About Charity fundraiser in the Last Year: Never true   Arboriculturist in the Last Year: Never true  Transportation Needs: No Transportation Needs   Lack of Transportation (Medical): No   Lack of Transportation (Non-Medical): No  Physical Activity: Sufficiently Active   Days of Exercise per Week: 3 days   Minutes of Exercise per Session: 60 min  Stress: No Stress Concern Present   Feeling of Stress : Not at all  Social Connections: Moderately Integrated   Frequency of Communication with Friends and Family: More than three times a week   Frequency of Social Gatherings with Friends and Family: More than three times a week   Attends Religious Services: 1 to 4 times per year   Active Member of [employer] or Organizations: Yes   Attends Archivist Meetings: 1 to 4 times per year   Marital Status: Divorced  Human resources officer  Violence: Not At Risk   Fear of Current or Ex-Partner: No   Emotionally Abused: No   Physically Abused: No   Sexually Abused: No    Past Medical History, Surgical history, Social history, and Family history were reviewed and updated as appropriate.   Please see review of systems for further details on the patient's review from today.   Objective:   Physical Exam:  LMP 01/10/1989 (Approximate)   Physical Exam Constitutional:      General: She is not in acute distress.    Appearance: She is well-developed.  Musculoskeletal:        General: No deformity.  Neurological:     Mental Status: She is alert and oriented to person, place, and time.     Motor: No tremor.     Coordination: Coordination normal.     Gait: Gait normal.  Psychiatric:        Attention and Perception: Attention normal. She is attentive.        Mood and Affect: Mood normal. Mood is not anxious or depressed. Affect is not labile, blunt, angry or inappropriate.        Speech: Speech normal. Speech is not rapid and pressured.        Behavior: Behavior normal.        Thought Content: Thought content normal. Thought content does not include homicidal or suicidal ideation. Thought content does not include homicidal or suicidal plan.        Cognition and Memory: Cognition normal.        Judgment: Judgment normal.     Comments: Insight is good. No hypomania.      Lab Review:     Component Value Date/Time   NA 143 11/26/2019 1002   K 4.5 11/26/2019 1002   CL 104 11/26/2019 1002   CO2 28 11/26/2019 1002   GLUCOSE 90 11/26/2019 1002   GLUCOSE 80 09/20/2016 1644   BUN 28 (H) 11/26/2019 1002   CREATININE 0.84 11/26/2019 1002   CREATININE 0.70 08/11/2015 1155   CALCIUM 9.7 11/26/2019 1002   PROT 6.6 11/26/2019 1002   ALBUMIN 4.5 11/26/2019 1002   AST 22 11/26/2019 1002   ALT 21 11/26/2019 1002   ALKPHOS 95 11/26/2019 1002   BILITOT 0.5 11/26/2019 1002   GFRNONAA 70 11/26/2019 1002   GFRAA  81 11/26/2019 1002        Component Value Date/Time   WBC 5.2 04/29/2019 0908   WBC 8.9 09/20/2016 1644   RBC 4.86 04/29/2019 0908   RBC 4.71 09/20/2016 1644   HGB 14.1 04/29/2019 0908   HCT 42.1 04/29/2019 0908   PLT 269 04/29/2019 0908   MCV 87 04/29/2019 0908   MCH 29.0 04/29/2019 0908   MCH 28.2 03/02/2016 0455   MCHC 33.5 04/29/2019 0908   MCHC 33.0 09/20/2016 1644   RDW 13.1 04/29/2019 0908   LYMPHSABS 1.9 09/20/2016 1644   MONOABS 0.5 09/20/2016 1644   EOSABS 0.0 09/20/2016 1644   BASOSABS 0.1 09/20/2016 1644    No results found for: POCLITH, LITHIUM   No results found for: PHENYTOIN, PHENOBARB, VALPROATE, CBMZ   .res Assessment: Plan:    Brandy [name] was seen today for follow-up and episodic mood disorder (hcc.  Diagnoses and all orders for this visit:  Episodic mood disorder (HCC) -     FLUoxetine (PROZAC) 20 MG capsule; Take 1 capsule (20 mg total) by mouth daily.  Hypersomnolence disorder  Greater than 50% of 30 min face to face time with patient was spent on counseling and coordination of care. We discussed stable for some time since retirment. No hypomania since here.  Have had extensive discussions in the past about whether or not she truly bipolar.  She is done well off mood stabilizers the last several years.  Her hypomania in the past may have been more connected to medication induced than true bipolar.  The longer she goes without hypomania the less likely she is truly bipolar.  She thinks prior stressful environment and lack of adequate sleep with higher dose stimulant made her look bipolar.   Reminded her about the signs and symptoms of mania and hypomania.  She is aware.  Be careful with the amphetamine dose DT hx hypomania from it.  Disc the new med for hypersomnolence.  Value incorporating napping into the day to deal with hypersomnolence.  Has remained stable. Rec discuss [place] with Dr. Halford Chessman. Disc SE  No indication to change fluoxetine 20 mg daily.  Value exercise for  mood.  This appt was 30 mins.  FU 12 mos  [name], MD, DFAPA   Please see After Visit Summary for patient specific instructions.  Future Appointments  Date Time Provider Alpha  12/08/2020  9:45 AM [facility] ADVISOR [id number]    No orders of the defined types were placed in this encounter.     -------------------------------

## 2020-10-21 DIAGNOSIS — M542 Cervicalgia: Secondary | ICD-10-CM | POA: Diagnosis not present

## 2020-10-27 NOTE — Telephone Encounter (Signed)
Dr. Halford Chessman please advise on the My Chart message:   Please send an escript to [facility], [place].  [phone] [phone] for the following:   Dextroamphetamine 5mg  tablet/4 times daily  (120 tablets total).   The previous script for this medication was dispensed on 09/23/20  Thanks, Delorise Royals  Thank you

## 2020-10-29 MED ORDER — DEXTROAMPHETAMINE SULFATE 5 MG PO TABS: 20.0000 mg | ORAL_TABLET | Freq: Every day | ORAL | 0 refills | Status: DC

## 2020-10-29 NOTE — Telephone Encounter (Signed)
Script sent  

## 2020-11-27 ENCOUNTER — Encounter: Payer: Self-pay | Admitting: Pulmonary Disease

## 2020-11-27 MED ORDER — DEXTROAMPHETAMINE SULFATE 5 MG PO TABS: 20.0000 mg | ORAL_TABLET | Freq: Every day | ORAL | 0 refills | Status: DC

## 2020-11-30 DIAGNOSIS — K219 Gastro-esophageal reflux disease without esophagitis: Secondary | ICD-10-CM | POA: Diagnosis not present

## 2020-11-30 DIAGNOSIS — R490 Dysphonia: Secondary | ICD-10-CM | POA: Diagnosis not present

## 2020-12-02 DIAGNOSIS — M5412 Radiculopathy, cervical region: Secondary | ICD-10-CM | POA: Diagnosis not present

## 2020-12-07 ENCOUNTER — Encounter: Payer: Self-pay | Admitting: Family Medicine

## 2020-12-07 DIAGNOSIS — L821 Other seborrheic keratosis: Secondary | ICD-10-CM | POA: Insufficient documentation

## 2020-12-07 DIAGNOSIS — M5412 Radiculopathy, cervical region: Secondary | ICD-10-CM | POA: Insufficient documentation

## 2020-12-07 DIAGNOSIS — L570 Actinic keratosis: Secondary | ICD-10-CM | POA: Insufficient documentation

## 2020-12-08 ENCOUNTER — Ambulatory Visit: Payer: Medicare Other

## 2020-12-26 ENCOUNTER — Encounter: Payer: Self-pay | Admitting: Family Medicine

## 2020-12-26 ENCOUNTER — Encounter: Payer: Self-pay | Admitting: Pulmonary Disease

## 2020-12-28 MED ORDER — DEXTROAMPHETAMINE SULFATE 5 MG PO TABS: 20.0000 mg | ORAL_TABLET | Freq: Every day | ORAL | 0 refills | Status: DC

## 2020-12-28 NOTE — Telephone Encounter (Signed)
Dr. Halford Chessman please advise on the following My Chart message:   Please send an escript to [facility], [place].; [place]; [phone] for the following medication:  (It was previously dispensed on Nov 27, 2020)   Generic dextroamphetamine 5 mg tablet, 4 tabs daily, 30-day supply =120 tabs   Thanks & happy holidays, Cinthia Rodden  Thank you

## 2020-12-28 NOTE — Telephone Encounter (Signed)
Please let her know refill has been sent

## 2021-01-28 ENCOUNTER — Encounter: Payer: Self-pay | Admitting: Pulmonary Disease

## 2021-01-28 NOTE — Telephone Encounter (Signed)
Dr. Sood, please see mychart message sent by pt and advise. °

## 2021-01-29 MED ORDER — DEXTROAMPHETAMINE SULFATE 5 MG PO TABS: 20.0000 mg | ORAL_TABLET | Freq: Every day | ORAL | 0 refills | Status: DC

## 2021-01-29 NOTE — Telephone Encounter (Signed)
Please let her know I have sent refill for Dextroamphetamine 5 mg tab, 4 tablets daily, quantity 120 tablets to [facility], [place], [place].

## 2021-02-09 ENCOUNTER — Encounter: Payer: Self-pay | Admitting: Internal Medicine

## 2021-02-10 ENCOUNTER — Ambulatory Visit (INDEPENDENT_AMBULATORY_CARE_PROVIDER_SITE_OTHER): Payer: Medicare Other | Admitting: Pulmonary Disease

## 2021-02-10 ENCOUNTER — Encounter: Payer: Self-pay | Admitting: Pulmonary Disease

## 2021-02-10 ENCOUNTER — Other Ambulatory Visit: Payer: Self-pay

## 2021-02-10 VITALS — BP 142/70 | HR 74 | Temp 98.0°F | Ht 60.0 in | Wt 111.8 lb

## 2021-02-10 DIAGNOSIS — G47419 Narcolepsy without cataplexy: Secondary | ICD-10-CM | POA: Diagnosis not present

## 2021-02-10 MED ORDER — SUNOSI 75 MG PO TABS: 1.0000 | ORAL_TABLET | Freq: Every day | ORAL | 3 refills | Status: DC

## 2021-02-10 NOTE — Progress Notes (Signed)
unosi  [facility]  Chief Complaint  Patient presents with   Follow-up    C/o tiredness, feels like medicine (Dextrostat)is not as effective    Constitutional:  BP (!) 142/70 (BP Location: Left Arm, Cuff Size: Normal)    Pulse 74    Temp 98 F (36.7 C) (Temporal)    Ht 5' (1.524 m)    Wt 111 lb 12.8 oz (50.7 kg)    LMP 01/10/1989 (Approximate)    SpO2 98%    BMI 21.83 kg/m   Past Medical History:  OA, CAD, Bipolar with depression, Migraine HA, HLD, HTN, MVP  Past Surgical History:  She  has a past surgical history that includes trach; myomectomy (1981); Vesicovaginal fistula closure w/ TAH; Rotator cuff repair (1998); Coronary artery bypass graft (03/21/05); Shoulder arthroscopy (2006); Total abdominal hysterectomy (1991); cataract surgery; Total hip arthroplasty (Left, 03/01/2016); and reconstructive surgery (1945).  Brief Summary:  Brandy [name] is a 71 y.o. 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      Subjective:   She is having more trouble with daytime fatigue and sleepiness.  She goes to bed at 11 pm and wakes up at 7 am.  She tries to stay active during the day.  She is part of a rowing club and this helps keep her alert.  She does struggle in the mid-afternoon.    Her mood is okay.   Physical Exam:   Appearance - well kempt   ENMT - no sinus tenderness, no oral exudate, no LAN, Mallampati 3 airway, no stridor  Respiratory - equal breath sounds bilaterally, no wheezing or rales  CV - s1s2 regular rate and rhythm, no murmurs  Ext - no clubbing, no edema  Skin - no rashes  Psych - normal mood and affect    Sleep Tests:  PSG 04/30/07 >> AHI 0.5, PLMI 5.2  MSLT 04/30/07 >> mean sleep latency 4 min, 5/5 naps, 0/5 SOREM PSG 06/11/20 >> AHI 0.7, SpO2 low 85%, PLMI 22.8.  Cardiac Tests:  Echo 05/01/19 >> EF 70 to 75%  Social History:  She  reports that she has never smoked. She has never used smokeless tobacco. She  reports that she does not drink alcohol and does not use drugs.  Family History:  Her family history includes Alcohol abuse in her maternal grandfather; Aneurysm in her mother; Arthritis in her maternal grandmother; Bipolar disorder in her mother and another family member; Hypertension in her mother and another family member; Kidney disease in her mother; Lung cancer in her father; Osteoporosis in her maternal grandmother; Stroke in her mother.     Assessment/Plan:   Narcolepsy w/o cataplexy. 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 - seen by Dr. Mila Homer at voice disorders center in [facility] - followed by speech therapy at [facility]   Depression. - she is followed by Dr. [name]  CAD s/p CABG, HTN, HLD. - followed by Dr. Shelva Majestic with [place]  Time Spent Involved in Patient Care on Day of Examination:  26 minutes  Follow up:   Patient Instructions  Sunosi 75 mg nightly  Follow up in 6 weeks  Medication List:   Allergies as of 02/10/2021       Reactions   Penicillins Anaphylaxis   Has patient had a PCN reaction causing immediate rash, facial/tongue/throat swelling, SOB or lightheadedness with hypotension: Yes Has patient had a PCN reaction causing severe rash involving mucus membranes or skin necrosis: No Has patient had a PCN reaction that required hospitalization Yes Has patient had a PCN reaction occurring within the last 10 years: No If all of the above answers are "NO", then may proceed with Cephalosporin use.   Pentazocine Other (See Comments)   Bextra  [valdecoxib] Hives   Codeine Nausea And Vomiting   Lactose Intolerance (gi) Diarrhea   Meperidine Hcl Nausea And Vomiting   Morphine Nausea And Vomiting   Pentazocine Lactate    Muscle spasms         Medication List        Accurate as of February 10, 2021  1:49 PM. If you have any questions, ask your nurse or doctor.          celecoxib 200 MG capsule Commonly known as: CELEBREX celecoxib 200 mg capsule  Take 1 capsule every day by oral route as needed.   chlorthalidone 25 MG tablet Commonly known as: HYGROTON TAKE 1/2 TABLET BY MOUTH DAILY   dextroamphetamine 5 MG tablet Commonly known as: DEXTROSTAT Take 4 tablets (20 mg total) by mouth daily.   famotidine 20 MG tablet Commonly known as: PEPCID Take 20 mg by mouth daily.   Fish Oil 1200 MG Caps   FLUoxetine 20 MG capsule Commonly known as: PROZAC Take 1 capsule (20 mg total) by mouth daily.   Multiple Vitamins/Womens tablet Take 1 tablet by mouth daily.   omeprazole 20 MG capsule Commonly known as: PRILOSEC omeprazole 20 mg capsule,delayed release   rosuvastatin 40 MG tablet Commonly known as: CRESTOR Take 1 tablet (40 mg total) by mouth daily.   Sunosi 75 MG Tabs Generic drug: Solriamfetol HCl Take 1 tablet by mouth daily. Started by: [name], MD   TYLENOL ARTHRITIS PAIN PO Take by mouth.   valsartan 320 MG tablet Commonly known as: DIOVAN Take 1 tablet (320 mg total) by mouth daily.   Vitamin D3 1.25 MG (50000 UT) Caps Vitamin D3        Signature:  [name], MD [place] Pager - [phone] 02/10/2021, 1:49 PM

## 2021-02-10 NOTE — Patient Instructions (Signed)
Sunosi 75 mg nightly  Follow up in 6 weeks

## 2021-02-11 ENCOUNTER — Other Ambulatory Visit (HOSPITAL_COMMUNITY): Payer: Self-pay

## 2021-02-11 ENCOUNTER — Telehealth: Payer: Self-pay | Admitting: Pharmacy Technician

## 2021-02-11 NOTE — Telephone Encounter (Signed)
Patient Advocate Encounter  Received notification from [facility]) that prior authorization for SUNOSI 75MG  is required.   PA submitted on 2.2.23 Key [id number] Status is pending   [facility] will continue to follow  Luciano Cutter, CPhT Patient Advocate Phone: [phone] Fax:  [phone]

## 2021-02-12 ENCOUNTER — Other Ambulatory Visit (HOSPITAL_COMMUNITY): Payer: Self-pay

## 2021-02-12 NOTE — Telephone Encounter (Signed)
Patient Advocate Encounter  Prior Authorization for Sunosi 75mg  tabs has been approved.    PA# [id number]/A  Effective dates: 01/10/21 through 02/11/22  Refill too soon  Spoke with Pharmacy to Process.  Patient Advocate Fax: [phone]

## 2021-02-15 ENCOUNTER — Ambulatory Visit: Payer: Medicare Other | Admitting: Pulmonary Disease

## 2021-02-15 ENCOUNTER — Encounter: Payer: Self-pay | Admitting: Pulmonary Disease

## 2021-02-15 NOTE — Telephone Encounter (Signed)
Dr. Halford Chessman, please see mychart message sent by pt. If we need to send this to our prior auth team for further med recommendations, we can.

## 2021-02-16 NOTE — Telephone Encounter (Signed)
New mychart message sent by pt: [name] (supporting [name], MD) 18 minutes ago (9:53 AM)     Hi Dr. Halford Chessman it's Brandy [name]. Thanks for your reply.  I looked at meds for narcolepsy on GoodRx. Other central nervous system stimulants vary in price ($28 - $400+) Suspect any new drug, such as Sunosi, will be very expensive.  Provigil & Nuvigil are affordable but didn't work well for me in the past.  Can either of those meds be used in addition to dextroamphetamine?     I am such a project!!  Thanks for your help.     Dr. Halford Chessman, please advise.

## 2021-02-17 NOTE — Telephone Encounter (Signed)
Brandy [name]  P [facility] Pool (supporting [name], MD) 38 minutes ago (2:34 PM)   Ok let's try the longer acting dex derivative in the morning & the shorter acting in the afternoon.  I'll contact you when it's time for a new prescription.     I really appreciate your help.   Brandy [name]

## 2021-03-01 NOTE — Telephone Encounter (Signed)
Please advise.   Did not fill prescription for Sunosi--too expensive   Last dextrostat script was dispensed 01/29/21.  It was a 30 day supply that will run out on 03/01/21   Dr. Halford Chessman wants me to try a different approach: dextroamphetamine extended release in the morning & tablet in the afternoon.  Please see his previous [employer].

## 2021-03-02 DIAGNOSIS — M5412 Radiculopathy, cervical region: Secondary | ICD-10-CM | POA: Diagnosis not present

## 2021-03-02 MED ORDER — DEXTROAMPHETAMINE SULFATE 10 MG PO TABS: 10.0000 mg | ORAL_TABLET | Freq: Every day | ORAL | 0 refills | Status: DC

## 2021-03-02 MED ORDER — DEXTROAMPHETAMINE SULFATE ER 15 MG PO CP24: 15.0000 mg | ORAL_CAPSULE | Freq: Every day | ORAL | 0 refills | Status: DC

## 2021-03-02 NOTE — Telephone Encounter (Signed)
Please let her know I have sent scripts to her pharmacy.  She is to take dexedrine 15 mg in the morning.  This is the extend release form of dextroamphetamine.  She should take 10 mg of dextrostat in the afternoon.

## 2021-03-04 DIAGNOSIS — U071 COVID-19: Secondary | ICD-10-CM

## 2021-03-04 HISTORY — DX: COVID-19: U07.1

## 2021-03-05 ENCOUNTER — Telehealth: Payer: Medicare Other | Admitting: Physician Assistant

## 2021-03-05 DIAGNOSIS — U071 COVID-19: Secondary | ICD-10-CM | POA: Diagnosis not present

## 2021-03-05 MED ORDER — ALBUTEROL SULFATE HFA 108 (90 BASE) MCG/ACT IN AERS: 2.0000 | INHALATION_SPRAY | Freq: Four times a day (QID) | RESPIRATORY_TRACT | 0 refills | Status: DC | PRN

## 2021-03-05 MED ORDER — DEXAMETHASONE 4 MG PO TABS: 4.0000 mg | ORAL_TABLET | Freq: Two times a day (BID) | ORAL | 0 refills | Status: DC

## 2021-03-05 MED ORDER — MOLNUPIRAVIR EUA 200MG CAPSULE: 4.0000 | ORAL_CAPSULE | Freq: Two times a day (BID) | ORAL | 0 refills | Status: AC

## 2021-03-05 NOTE — Progress Notes (Signed)
Virtual Visit Consent   Brandy [name], you are scheduled for a virtual visit with a [facility] provider today.     Just as with appointments in the office, your consent must be obtained to participate.  Your consent will be active for this visit and any virtual visit you may have with one of our providers in the next 365 days.     If you have a MyChart account, a copy of this consent can be sent to you electronically.  All virtual visits are billed to your insurance company just like a traditional visit in the office.    As this is a virtual visit, video technology does not allow for your provider to perform a traditional examination.  This may limit your provider's ability to fully assess your condition.  If your provider identifies any concerns that need to be evaluated in person or the need to arrange testing (such as labs, EKG, etc.), we will make arrangements to do so.     Although advances in technology are sophisticated, we cannot ensure that it will always work on either your end or our end.  If the connection with a video visit is poor, the visit may have to be switched to a telephone visit.  With either a video or telephone visit, we are not always able to ensure that we have a secure connection.     I need to obtain your verbal consent now.   Are you willing to proceed with your visit today?    Brandy [name] has provided verbal consent on 03/05/2021 for a virtual visit (video or telephone).   Mar Daring, PA-C   Date: 03/05/2021 7:35 PM   Virtual Visit via Video Note   I, Mar Daring, connected with  Brandy [name]  ([id number], Jul 07, 1950) on 03/05/21 at  7:15 PM EST by a video-enabled telemedicine application and verified that I am speaking with the correct person using two identifiers.  Location: Patient: Virtual Visit Location Patient: Home Provider: Virtual Visit Location Provider: Home Office   I discussed the limitations of evaluation and  management by telemedicine and the availability of in person appointments. The patient expressed understanding and agreed to proceed.    History of Present Illness: Brandy [name] is a 71 y.o. who identifies as a female who was assigned female at birth, and is being seen today for Covid 71.  HPI: URI  This is a new problem. Episode onset: tested positive for covid 19 today; symptoms started wednesday. The problem has been gradually worsening. Associated symptoms include congestion, coughing, diarrhea, headaches, rhinorrhea and a sore throat. Pertinent negatives include no nausea or vomiting. Associated symptoms comments: Body aches, fatigue. She has tried acetaminophen for the symptoms. The treatment provided no relief.   Has had 5 vaccinations.  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   Hyperlipidemia    Idiopathic hypersomnia    CAD (coronary artery disease)    Hypertension  Allergies:  Allergies  Allergen Reactions   Penicillins Anaphylaxis    Has patient had a PCN reaction causing immediate rash, facial/tongue/throat swelling, SOB or lightheadedness with hypotension: Yes Has patient had a PCN reaction causing severe rash involving mucus membranes or skin necrosis: No Has patient had a PCN reaction that required hospitalization Yes Has patient had a PCN reaction occurring within the last 10 years: No If all of the above answers are "NO", then may proceed with  Cephalosporin use.   Pentazocine Other (See Comments)   Bextra [Valdecoxib] Hives   Codeine Nausea And Vomiting   Lactose Intolerance (Gi) Diarrhea   Meperidine Hcl Nausea And Vomiting   Morphine Nausea And Vomiting   Pentazocine Lactate     Muscle spasms    Medications:  Current Outpatient Medications:    albuterol (VENTOLIN HFA) 108 (90 Base) MCG/ACT inhaler, Inhale 2 puffs into the lungs every 6 (six) hours as needed for wheezing or shortness of breath., Disp: 18 g, Rfl: 0   dexamethasone (DECADRON) 4 MG tablet, Take 1 tablet (4 mg total) by mouth 2 (two) times daily with a meal., Disp: 10 tablet, Rfl: 0   molnupiravir EUA (LAGEVRIO) 200 mg CAPS capsule, Take 4 capsules (800 mg total) by mouth 2 (two) times daily for 5 days., Disp: 40 capsule, Rfl: 0   Acetaminophen (TYLENOL ARTHRITIS PAIN PO), Take by mouth., Disp: , Rfl:    celecoxib (CELEBREX) 200 MG capsule, celecoxib 200 mg capsule  Take 1 capsule every day by oral route as needed., Disp: , Rfl:    chlorthalidone (HYGROTON) 25 MG tablet, TAKE 1/2 TABLET BY MOUTH DAILY, Disp: 45 tablet, Rfl: 3   Cholecalciferol (VITAMIN D3) 1.25 MG (50000 UT) CAPS, Vitamin D3, Disp: , Rfl:    dextroamphetamine (DEXEDRINE SPANSULE) 15 MG 24 hr capsule, Take 1 capsule (15 mg total) by mouth daily., Disp: 30 capsule, Rfl: 0   dextroamphetamine (DEXTROSTAT) 10 MG tablet, Take 1 tablet (10 mg total) by mouth daily after lunch., Disp: 30 tablet, Rfl: 0   famotidine (PEPCID) 20 MG tablet, Take 20 mg by mouth daily., Disp: , Rfl:    FLUoxetine (PROZAC) 20 MG capsule, Take 1 capsule (20 mg total) by mouth daily., Disp: 90 capsule, Rfl: 3   Multiple Vitamins-Minerals (MULTIPLE VITAMINS/WOMENS) tablet, Take 1 tablet by mouth daily., Disp: , Rfl:    Omega-3 Fatty Acids (FISH OIL) 1200 MG CAPS, , Disp: , Rfl:    omeprazole (PRILOSEC) 20 MG capsule, omeprazole 20 mg capsule,delayed release, Disp: , Rfl:    rosuvastatin (CRESTOR) 40 MG tablet, Take 1 tablet (40 mg  total) by mouth daily. (Patient not taking: Reported on 02/10/2021), Disp: 90 tablet, Rfl: 3   valsartan (DIOVAN) 320 MG tablet, Take 1 tablet (320 mg total) by mouth daily., Disp: 90 tablet, Rfl: 3  Observations/Objective: Patient is well-developed, well-nourished in no acute distress.  Resting comfortably at home.  Head is normocephalic, atraumatic.  No labored breathing.  Speech is clear and coherent with logical content.  Patient is alert and oriented at baseline.    Assessment and Plan: 1. COVID-19 - MyChart COVID-19 home monitoring program; Future - molnupiravir EUA (LAGEVRIO) 200 mg CAPS capsule; Take 4 capsules (800 mg total) by mouth 2 (two) times daily for 5 days.  Dispense: 40 capsule; Refill: 0 - dexamethasone (DECADRON) 4 MG tablet; Take 1 tablet (4 mg total) by mouth 2 (two) times daily with a meal.  Dispense: 10 tablet; Refill: 0 - albuterol (VENTOLIN HFA)  108 (90 Base) MCG/ACT inhaler; Inhale 2 puffs into the lungs every 6 (six) hours as needed for wheezing or shortness of breath.  Dispense: 18 g; Refill: 0  - Continue OTC symptomatic management of choice - Will send OTC vitamins and supplement information through AVS - Molnupiravir, dexamethasone, and albuterol prescribed - Patient enrolled in MyChart symptom monitoring - Push fluids - Rest as needed - Discussed return precautions and when to seek in-person evaluation, sent via AVS as well  Follow Up Instructions: I discussed the assessment and treatment plan with the patient. The patient was provided an opportunity to ask questions and all were answered. The patient agreed with the plan and demonstrated an understanding of the instructions.  A copy of instructions were sent to the patient via MyChart unless otherwise noted below.    The patient was advised to call back or seek an in-person evaluation if the symptoms worsen or if the condition fails to improve as anticipated.  Time:  I spent 15 minutes with the  patient via telehealth technology discussing the above problems/concerns.    Mar Daring, PA-C

## 2021-03-05 NOTE — Patient Instructions (Signed)
[name], thank you for joining Mar Daring, PA-C for today's virtual visit.  While this provider is not your primary care provider (PCP), if your PCP is located in our provider database this encounter information will be shared with them immediately following your visit.  Consent: (Patient) Brandy [name] provided verbal consent for this virtual visit at the beginning of the encounter.  Current Medications:  Current Outpatient Medications:    albuterol (VENTOLIN HFA) 108 (90 Base) MCG/ACT inhaler, Inhale 2 puffs into the lungs every 6 (six) hours as needed for wheezing or shortness of breath., Disp: 18 g, Rfl: 0   dexamethasone (DECADRON) 4 MG tablet, Take 1 tablet (4 mg total) by mouth 2 (two) times daily with a meal., Disp: 10 tablet, Rfl: 0   molnupiravir EUA (LAGEVRIO) 200 mg CAPS capsule, Take 4 capsules (800 mg total) by mouth 2 (two) times daily for 5 days., Disp: 40 capsule, Rfl: 0   Acetaminophen (TYLENOL ARTHRITIS PAIN PO), Take by mouth., Disp: , Rfl:    celecoxib (CELEBREX) 200 MG capsule, celecoxib 200 mg capsule  Take 1 capsule every day by oral route as needed., Disp: , Rfl:    chlorthalidone (HYGROTON) 25 MG tablet, TAKE 1/2 TABLET BY MOUTH DAILY, Disp: 45 tablet, Rfl: 3   Cholecalciferol (VITAMIN D3) 1.25 MG (50000 UT) CAPS, Vitamin D3, Disp: , Rfl:    dextroamphetamine (DEXEDRINE SPANSULE) 15 MG 24 hr capsule, Take 1 capsule (15 mg total) by mouth daily., Disp: 30 capsule, Rfl: 0   dextroamphetamine (DEXTROSTAT) 10 MG tablet, Take 1 tablet (10 mg total) by mouth daily after lunch., Disp: 30 tablet, Rfl: 0   famotidine (PEPCID) 20 MG tablet, Take 20 mg by mouth daily., Disp: , Rfl:    FLUoxetine (PROZAC) 20 MG capsule, Take 1 capsule (20 mg total) by mouth daily., Disp: 90 capsule, Rfl: 3   Multiple Vitamins-Minerals (MULTIPLE VITAMINS/WOMENS) tablet, Take 1 tablet by mouth daily., Disp: , Rfl:    Omega-3 Fatty Acids (FISH OIL) 1200 MG CAPS, , Disp: ,  Rfl:    omeprazole (PRILOSEC) 20 MG capsule, omeprazole 20 mg capsule,delayed release, Disp: , Rfl:    rosuvastatin (CRESTOR) 40 MG tablet, Take 1 tablet (40 mg total) by mouth daily. (Patient not taking: Reported on 02/10/2021), Disp: 90 tablet, Rfl: 3   valsartan (DIOVAN) 320 MG tablet, Take 1 tablet (320 mg total) by mouth daily., Disp: 90 tablet, Rfl: 3   Medications ordered in this encounter:  Meds ordered this encounter  Medications   molnupiravir EUA (LAGEVRIO) 200 mg CAPS capsule    Sig: Take 4 capsules (800 mg total) by mouth 2 (two) times daily for 5 days.    Dispense:  40 capsule    Refill:  0    Order Specific Question:   Supervising Provider    Answer:   MILLER, BRIAN [[id number]]   dexamethasone (DECADRON) 4 MG tablet    Sig: Take 1 tablet (4 mg total) by mouth 2 (two) times daily with a meal.    Dispense:  10 tablet    Refill:  0    Order Specific Question:   Supervising Provider    Answer:   Sabra Heck, BRIAN [[id number]]   albuterol (VENTOLIN HFA) 108 (90 Base) MCG/ACT inhaler    Sig: Inhale 2 puffs into the lungs every 6 (six) hours as needed for wheezing or shortness of breath.    Dispense:  18 g    Refill:  0    Order  Specific Question:   Supervising Provider    Answer:   Noemi Chapel [[id number]]     *If you need refills on other medications prior to your next appointment, please contact your pharmacy*  Follow-Up: Call back or seek an in-person evaluation if the symptoms worsen or if the condition fails to improve as anticipated.  Other Instructions 10 Things You Can Do to Manage Your COVID-19 Symptoms at Home If you have possible or confirmed COVID-19 Stay home except to get medical care. Monitor your symptoms carefully. If your symptoms get worse, call your healthcare provider immediately. Get rest and stay hydrated. If you have a medical appointment, call the healthcare provider ahead of time and tell them that you have or may have COVID-19. For medical emergencies, call 911  and notify the dispatch personnel that you have or may have COVID-19. Cover your cough and sneezes with a tissue or use the inside of your elbow. Wash your hands often with soap and water for at least 20 seconds or clean your hands with an alcohol-based hand sanitizer that contains at least 60% alcohol. As much as possible, stay in a specific room and away from other people in your home. Also, you should use a separate bathroom, if available. If you need to be around other people in or outside of the home, wear a mask. Avoid sharing personal items with other people in your household, like dishes, towels, and bedding. Clean all surfaces that are touched often, like counters, tabletops, and doorknobs. Use household cleaning sprays or wipes according to the label instructions. [email or web address] 07/26/2019 This information is not intended to replace advice given to you by your health care provider. Make sure you discuss any questions you have with your health care provider. Document Revised: 09/18/2020 Document Reviewed: 09/18/2020 Elsevier Patient Education  2022 [place] Oral Capsules What is this medication? MOLNUPIRAVIR (mol nue pir a vir) treats COVID-19. It is an antiviral medication. It may decrease the risk of developing severe symptoms of COVID-19. It may also decrease the chance of going to the hospital. This medication is not approved by the FDA. The FDA has authorized emergency use of this medication during the COVID-19 pandemic. This medicine may be used for other purposes; ask your health care provider or pharmacist if you have questions. COMMON BRAND NAME(S): LAGEVRIO What should I tell my care team before I take this medication? They need to know if you have any of these conditions: Any allergies Any serious illness An unusual or allergic reaction to molnupiravir, other medications, foods, dyes, or preservatives Pregnant or trying to get  pregnant Breast-feeding How should I use this medication? Take this medication by mouth with water. Take it as directed on the prescription label at the same time every day. Do not cut, crush or chew this medication. Swallow the capsules whole. You can take it with or without food. If it upsets your stomach, take it with food. Take all of this medication unless your care team tells you to stop it early. Keep taking it even if you think you are better. Talk to your care team about the use of this medication in children. Special care may be needed. Overdosage: If you think you have taken too much of this medicine contact a poison control center or emergency room at once. NOTE: This medicine is only for you. Do not share this medicine with others. What if I miss a dose? If you miss a dose, take it as  soon as you can unless it is more than 10 hours late. If it is more than 10 hours late, skip the missed dose. Take the next dose at the normal time. Do not take extra or 2 doses at the same time to make up for the missed dose. What may interact with this medication? Interactions have not been studied. This list may not describe all possible interactions. Give your health care provider a list of all the medicines, herbs, non-prescription drugs, or dietary supplements you use. Also tell them if you smoke, drink alcohol, or use illegal drugs. Some items may interact with your medicine. What should I watch for while using this medication? Your condition will be monitored carefully while you are receiving this medication. Visit your care team for regular checkups. Tell your care team if your symptoms do not start to get better or if they get worse. Do not become pregnant while taking this medication. You may need a pregnancy test before starting this medication. Women must use a reliable form of birth control while taking this medication and for 4 days after stopping the medication. Women should inform their care  team if they wish to become pregnant or think they might be pregnant. Men should not father a child while taking this medication and for 3 months after stopping it. There is potential for serious harm to an unborn child. Talk to your care team for more information. Do not breast-feed an infant while taking this medication and for 4 days after stopping the medication. What side effects may I notice from receiving this medication? Side effects that you should report to your care team as soon as possible: Allergic reactions--skin rash, itching, hives, swelling of the face, lips, tongue, or throat Side effects that usually do not require medical attention (report these to your care team if they continue or are bothersome): Diarrhea Dizziness Nausea This list may not describe all possible side effects. Call your doctor for medical advice about side effects. You may report side effects to FDA at 1-800-FDA-1088. Where should I keep my medication? Keep out of the reach of children and pets. Store at room temperature between 20 and 25 degrees C (68 and 77 degrees F). Get rid of any unused medication after the expiration date. To get rid of medications that are no longer needed or have expired: Take the medication to a medication take-back program. Check with your pharmacy or law enforcement to find a location. If you cannot return the medication, check the label or package insert to see if the medication should be thrown out in the garbage or flushed down the toilet. If you are not sure, ask your care team. If it is safe to put it in the trash, take the medication out of the container. Mix the medication with cat litter, dirt, coffee grounds, or other unwanted substance. Seal the mixture in a bag or container. Put it in the trash. NOTE: This sheet is a summary. It may not cover all possible information. If you have questions about this medicine, talk to your doctor, pharmacist, or health care provider.  2022  Elsevier/Gold Standard (2020-01-06 00:00:00)    If you have been instructed to have an in-person evaluation today at a local Urgent Care facility, please use the link below. It will take you to a list of all of our available [facility], including address, phone number and hours of operation. Please do not delay care.  [facility]  If you or a family member do not have a primary care provider, use the link below to schedule a visit and establish care. When you choose a [facility] primary care physician or advanced practice provider, you gain a long-term partner in health. Find a Primary Care Provider  Learn more about [facility] in-office and virtual care options: [facility]

## 2021-03-11 ENCOUNTER — Encounter: Payer: Self-pay | Admitting: Pulmonary Disease

## 2021-03-11 ENCOUNTER — Telehealth: Payer: Self-pay | Admitting: Pulmonary Disease

## 2021-03-11 NOTE — Telephone Encounter (Signed)
Called patient and she stated that her pharmacy does not have Dextroamphetamine in stock. But she did states that is carried Financial controller in stock.  ? ?Patients pharmacy is [facility]  ? ?Dr Halford Chessman please advise  ?

## 2021-03-11 NOTE — Telephone Encounter (Signed)
Do you mean the pharmacy doesn't have the extended release form of dextroamphetamine?  Please clarify.  If the pharmacy doesn't have the extend release form of dextroamphetamine, then please ask if she would want to resume her previous prescription for dextroamphetamine. ?

## 2021-03-12 MED ORDER — DEXTROAMPHETAMINE SULFATE 5 MG PO TABS: 10.0000 mg | ORAL_TABLET | Freq: Two times a day (BID) | ORAL | 0 refills | Status: DC

## 2021-03-12 NOTE — Telephone Encounter (Signed)
Please advise on mychart.  ?

## 2021-03-12 NOTE — Telephone Encounter (Signed)
I have sent script for dextroamphetamine 5 mg pills to [facility]. ?

## 2021-03-12 NOTE — Telephone Encounter (Signed)
I send new order in earlier today. ?

## 2021-03-12 NOTE — Telephone Encounter (Signed)
Called and spoke with pt to clarify which med it was that the pharmacy could not get. She stated that it was the extended release form that the pharmacy could not get but she said what she used to get at the pharmacy which would be the 5mg  tablets of the Dextrostat is what they do have in stock. ? ?

## 2021-03-23 ENCOUNTER — Ambulatory Visit (INDEPENDENT_AMBULATORY_CARE_PROVIDER_SITE_OTHER): Payer: Medicare Other | Admitting: Internal Medicine

## 2021-03-23 ENCOUNTER — Encounter: Payer: Self-pay | Admitting: Internal Medicine

## 2021-03-23 VITALS — BP 118/60 | HR 52 | Ht 60.0 in | Wt 110.0 lb

## 2021-03-23 DIAGNOSIS — R159 Full incontinence of feces: Secondary | ICD-10-CM | POA: Diagnosis not present

## 2021-03-23 DIAGNOSIS — K219 Gastro-esophageal reflux disease without esophagitis: Secondary | ICD-10-CM | POA: Diagnosis not present

## 2021-03-23 DIAGNOSIS — R194 Change in bowel habit: Secondary | ICD-10-CM | POA: Diagnosis not present

## 2021-03-23 MED ORDER — SUTAB 1479-225-188 MG PO TABS: ORAL_TABLET | ORAL | 0 refills | Status: DC

## 2021-03-23 NOTE — Progress Notes (Signed)
Patient ID: Brandy [name], female   DOB: 1950/04/27, 71 y.o.   MRN: [id number] HPI: Brandy [name] is a 71 year old female with a history of IBS, GERD, narcolepsy, hypertension, hyperlipidemia, CAD with prior CABG, bipolar disorder who is seen to discuss encopresis.  She is here alone today.  She was previously seen by Dr. Kinnie Scales and reports having had a normal colonoscopy in 2017.  Her predominant complaint is fecal incontinence, spontaneous and passive, incomplete bowel movements.  She reports symptoms for years though many years ago normal bowel movement for her was a complete bowel movement in the morning with no fecal leakage.  She reports that she is concerned that she may have constipation with overflow.  She feels sensitive to dairy products and uses Lactaid tablets and also at times Lactaid milk.  She tried almond milk.  She feels that in someway food triggers her alternating bowel habits.  From a bowel movement perspective she reports that most the time stools are soft but she does feel incomplete evacuation.  Rarely is her stool hard.  She can have small pieces of stool multiple times per day.  No diarrhea.  She has leakage of soft stool which she describes like "cake icing" on a daily basis.  This is involuntary and she becomes aware of it because of feeling wet.  She is not having abdominal pain, bloating or cramping.  At times she will use loperamide to try to reduce fecal leakage.  She does have mucus in her stool at times.  Very rarely will she see red blood with wiping.  She does have some perianal irritation and mild itching.  She feels that this is related to fecal smearing.  Prior surgeries include heart bypass in 2007, rotator cuff repair, hysterectomy 1991, hip replacement 2018.  Indigestion is controlled on famotidine 20 mg daily.  No upper GI complaints such as dysphagia or odynophagia.  Past Medical History:  Diagnosis Date   Arthritis    Bipolar 1 disorder (HCC)     CAD (coronary artery disease) 2007   CABG X 1   COVID 03/04/2021   Depression    controlled   Hay fever    Headache    Hx of migraines   Heart murmur    Hyperlipidemia    Hypertension    Idiopathic hypersomnia    Mitral valve prolapse    PONV (postoperative nausea and vomiting)    Urinary incontinence     Past Surgical History:  Procedure Laterality Date   cataract surgery     BIL   CORONARY ARTERY BYPASS GRAFT  03/21/05   off pump LIMA-LAD   myomectomy  1981   reconstructive surgery  1945   lip    ROTATOR CUFF REPAIR  1998   3 times   SHOULDER ARTHROSCOPY  2006   TOTAL ABDOMINAL HYSTERECTOMY  1991   TOTAL HIP ARTHROPLASTY Left 03/01/2016   Procedure: LEFT TOTAL HIP ARTHROPLASTY ANTERIOR APPROACH;  Surgeon: Durene Romans, MD;  Location: [facility];  Service: Orthopedics;  Laterality: Left;  Requests 70 mins   trach     VESICOVAGINAL FISTULA CLOSURE W/ TAH      Outpatient Medications Prior to Visit  Medication Sig Dispense Refill   Acetaminophen (TYLENOL ARTHRITIS PAIN PO) Take by mouth.     albuterol (VENTOLIN HFA) 108 (90 Base) MCG/ACT inhaler Inhale 2 puffs into the lungs every 6 (six) hours as needed for wheezing or shortness of breath. 18 g 0   celecoxib (CELEBREX) 200  MG capsule celecoxib 200 mg capsule  Take 1 capsule every day by oral route as needed.     chlorthalidone (HYGROTON) 25 MG tablet TAKE 1/2 TABLET BY MOUTH DAILY 45 tablet 3   Cholecalciferol (VITAMIN D3) 1.25 MG (50000 UT) CAPS Vitamin D3     dextroamphetamine (DEXTROSTAT) 5 MG tablet Take 2 tablets (10 mg total) by mouth 2 (two) times daily. 120 tablet 0   famotidine (PEPCID) 20 MG tablet Take 20 mg by mouth daily.     FLUoxetine (PROZAC) 20 MG capsule Take 1 capsule (20 mg total) by mouth daily. 90 capsule 3   Multiple Vitamins-Minerals (MULTIPLE VITAMINS/WOMENS) tablet Take 1 tablet by mouth daily.     OVER THE COUNTER MEDICATION Collegen Peptid 11 grams daily     valsartan (DIOVAN) 320 MG tablet Take 1  tablet (320 mg total) by mouth daily. 90 tablet 3   dexamethasone (DECADRON) 4 MG tablet Take 1 tablet (4 mg total) by mouth 2 (two) times daily with a meal. 10 tablet 0   Omega-3 Fatty Acids (FISH OIL) 1200 MG CAPS      omeprazole (PRILOSEC) 20 MG capsule omeprazole 20 mg capsule,delayed release     rosuvastatin (CRESTOR) 40 MG tablet Take 1 tablet (40 mg total) by mouth daily. (Patient not taking: Reported on 02/10/2021) 90 tablet 3   No facility-administered medications prior to visit.    Allergies  Allergen Reactions   Penicillins Anaphylaxis    Has patient had a PCN reaction causing immediate rash, facial/tongue/throat swelling, SOB or lightheadedness with hypotension: Yes Has patient had a PCN reaction causing severe rash involving mucus membranes or skin necrosis: No Has patient had a PCN reaction that required hospitalization Yes Has patient had a PCN reaction occurring within the last 10 years: No If all of the above answers are "NO", then may proceed with Cephalosporin use.   Pentazocine Other (See Comments)   Bextra [Valdecoxib] Hives   Codeine Nausea And Vomiting   Lactose Intolerance (Gi) Diarrhea   Meperidine Hcl Nausea And Vomiting   Morphine Nausea And Vomiting   Pentazocine Lactate     Muscle spasms     Family History  Problem Relation Age of Onset   Stroke Mother        died 11   Aneurysm Mother    Hypertension Mother    Kidney disease Mother    Bipolar disorder Mother    Lung cancer Father        died 35   Bipolar disorder Other    Hypertension Other    Osteoporosis Maternal Grandmother    Arthritis Maternal Grandmother    Alcohol abuse Maternal Grandfather     Social History   Tobacco Use   Smoking status: Never   Smokeless tobacco: Never  Vaping Use   Vaping Use: Never used  Substance Use Topics   Alcohol use: No   Drug use: No    ROS: As per history of present illness, otherwise negative  BP 118/60   Pulse (!) 52   Ht 5' (1.524 m)   Wt  110 lb (49.9 kg)   LMP 01/10/1989 (Approximate)   BMI 21.48 kg/m  Gen: awake, alert, NAD HEENT: anicteric, op clear CV: RRR, no mrg Pulm: CTA b/l Abd: soft, well-healed scar left lower quadrant , mild tenderness in the left lower quadrant without rebound or guarding, nondistended,   +BS throughout Rectal: Exam chaperoned by Clayborne Dana (CMA); fecal material around the anus, decreased but not absent  sphincter tone, large amount of soft brown stool in the vault, normal descent with Valsalva Ext: no c/c/e Neuro: nonfocal   RELEVANT LABS AND IMAGING: CBC    Component Value Date/Time   WBC 5.2 04/29/2019 0908   WBC 8.9 09/20/2016 1644   RBC 4.86 04/29/2019 0908   RBC 4.71 09/20/2016 1644   HGB 14.1 04/29/2019 0908   HCT 42.1 04/29/2019 0908   PLT 269 04/29/2019 0908   MCV 87 04/29/2019 0908   MCH 29.0 04/29/2019 0908   MCH 28.2 03/02/2016 0455   MCHC 33.5 04/29/2019 0908   MCHC 33.0 09/20/2016 1644   RDW 13.1 04/29/2019 0908   LYMPHSABS 1.9 09/20/2016 1644   MONOABS 0.5 09/20/2016 1644   EOSABS 0.0 09/20/2016 1644   BASOSABS 0.1 09/20/2016 1644    CMP     Component Value Date/Time   NA 143 11/26/2019 1002   K 4.5 11/26/2019 1002   CL 104 11/26/2019 1002   CO2 28 11/26/2019 1002   GLUCOSE 90 11/26/2019 1002   GLUCOSE 80 09/20/2016 1644   BUN 28 (H) 11/26/2019 1002   CREATININE 0.84 11/26/2019 1002   CREATININE 0.70 08/11/2015 1155   CALCIUM 9.7 11/26/2019 1002   PROT 6.6 11/26/2019 1002   ALBUMIN 4.5 11/26/2019 1002   AST 22 11/26/2019 1002   ALT 21 11/26/2019 1002   ALKPHOS 95 11/26/2019 1002   BILITOT 0.5 11/26/2019 1002   GFRNONAA 70 11/26/2019 1002   GFRAA 81 11/26/2019 1002    ASSESSMENT/PLAN: 71 year old female with a history of IBS, GERD, narcolepsy, hypertension, hyperlipidemia, CAD with prior CABG, bipolar disorder who is seen to discuss encopresis.   Encopresis --this may be a byproduct of constipation.  We also discussed pelvic floor dysfunction and  dyssynergy defecation today.  We had a thorough discussion about how to further evaluate this and I have recommended the following: --Colonoscopy in the [facility]; we reviewed the risk, benefits and alternatives and she is agreeable and wishes to proceed --Trial of Metamucil 2 teaspoons working to twice daily --If Metamucil ineffective trial of a laxative to help with positive motility and better emptying of the sigmoid and rectum; if ineffective anorectal manometry  2.  GERD --no alarm symptoms and well-controlled on famotidine 20 mg daily.  She will continue this dose  3.  Lactose intolerance --she certainly does have what sounds like lactose intolerance and so of asked that she avoid dairy or continue Lactaid tablets  4. CRC screening --we will obtain records from Dr. Kinnie Scales; reportedly last colonoscopy 2017.  She denies having had prior colonic polyps.  If this colonoscopy is normal she will likely not need repeat colorectal cancer screening colonoscopy based on age.    XL:KGMW, Bertram Millard, Md [address] [place],  [place] [address]

## 2021-03-23 NOTE — Patient Instructions (Addendum)
Please purchase dulcolax 5 mg tablets TODAY. Take 2 tablets with water at 6 pm TONIGHT. ? ?You have been scheduled for a colonoscopy. Please follow written instructions given to you at your visit today.  ?Please pick up your prep supplies at the pharmacy within the next 1-3 days. ?If you use inhalers (even only as needed), please bring them with you on the day of your procedure. ? ?Please purchase the following medications over the counter and take as directed: ?Metamucil 2 teaspoons daily, eventually working up to twice daily dosing. ? ?If you are age 73 or older, your body mass index should be between 23-30. Your Body mass index is 21.48 kg/m?Marland Kitchen If this is out of the aforementioned range listed, please consider follow up with your Primary Care Provider. ? ?If you are age 73 or younger, your body mass index should be between 19-25. Your Body mass index is 21.48 kg/m?Marland Kitchen If this is out of the aformentioned range listed, please consider follow up with your Primary Care Provider.  ? ?________________________________________________________ ? ?The [facility] providers would like to encourage you to use [facility] to communicate with providers for non-urgent requests or questions.  Due to long hold times on the telephone, sending your provider a message by [facility] may be a faster and more efficient way to get a response.  Please allow 48 business hours for a response.  Please remember that this is for non-urgent requests.  ?_______________________________________________________ ?Due to recent changes in healthcare laws, you may see the results of your imaging and laboratory studies on MyChart before your provider has had a chance to review them.  We understand that in some cases there may be results that are confusing or concerning to you. Not all laboratory results come back in the same time frame and the provider may be waiting for multiple results in order to interpret others.  Please give [place] 48 hours in order for your  provider to thoroughly review all the results before contacting the office for clarification of your results.  ? ?

## 2021-03-25 ENCOUNTER — Encounter: Payer: Medicare Other | Admitting: Internal Medicine

## 2021-03-29 ENCOUNTER — Encounter: Payer: Self-pay | Admitting: Pulmonary Disease

## 2021-03-29 ENCOUNTER — Ambulatory Visit (INDEPENDENT_AMBULATORY_CARE_PROVIDER_SITE_OTHER): Payer: Medicare Other | Admitting: Pulmonary Disease

## 2021-03-29 ENCOUNTER — Other Ambulatory Visit: Payer: Self-pay

## 2021-03-29 VITALS — BP 136/70 | HR 74 | Temp 98.1°F | Ht 60.0 in | Wt 112.2 lb

## 2021-03-29 DIAGNOSIS — G47419 Narcolepsy without cataplexy: Secondary | ICD-10-CM | POA: Diagnosis not present

## 2021-03-29 NOTE — Patient Instructions (Signed)
Email next month when you are due for refill of your medicine ? ?Follow up in 6 months ?

## 2021-03-29 NOTE — Progress Notes (Signed)
[place] ? ?[facility] ? ?Chief Complaint  ?Patient presents with  ? Follow-up  ?  ER sleep medicinae still not available ?'15mg'$  er cap is an option   ? ? ?Constitutional:  ?BP 136/70 (BP Location: Left Arm, Cuff Size: Normal)   Pulse 74   Temp 98.1 ?F (36.7 ?C) (Oral)   Ht 5' (1.524 m)   Wt 112 lb 3.2 oz (50.9 kg)   LMP 01/10/1989 (Approximate)   SpO2 97%   BMI 21.91 kg/m?  ? ?Past Medical History:  ?OA, CAD, Bipolar with depression, Migraine HA, HLD, HTN, MVP, COVID February 2023 ? ?Past Surgical History:  ?She  has a past surgical history that includes trach; myomectomy (1981); Vesicovaginal fistula closure w/ TAH; Rotator cuff repair (1998); Coronary artery bypass graft (03/21/05); Shoulder arthroscopy (2006); Total abdominal hysterectomy (1991); cataract surgery; Total hip arthroplasty (Left, 03/01/2016); and reconstructive surgery (1945). ? ?Brief Summary:  ?Brandy [name] is a 71 y.o. 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 ?  ? ? ? ?Subjective:  ? ?She had COVID in February.  Better now. ? ?Gets episodes of cough and wheeze when she rows.  Uses albuterol and this helps. ? ?Sunosi was too expensive.  She was told her current formulation of dextroamphetamine is not available.  She has enough for now. ? ?Her sleep cycle is off after having COVID and DSL. ? ?Physical Exam:  ? ?Appearance - well kempt  ? ?ENMT - no sinus tenderness, no oral exudate, no LAN, Mallampati 3 airway, no stridor ? ?Respiratory - equal breath sounds bilaterally, no wheezing or rales ? ?CV - s1s2 regular rate and rhythm, no murmurs ? ?Ext - no clubbing, no edema ? ?Skin - no rashes ? ?Psych - normal mood and affect ? ? ?  ?Sleep Tests:  ?PSG 04/30/07 >> AHI 0.5, PLMI 5.2  ?MSLT 04/30/07 >> mean sleep latency 4 min, 5/5 naps, 0/5 SOREM ?PSG 06/11/20 >> AHI 0.7, SpO2 low 85%, PLMI 22.8. ? ?Cardiac Tests:  ?Echo 05/01/19 >> EF 70 to 75% ? ?Social History:  ?She  reports that she has never smoked.  She has never used smokeless tobacco. She reports that she does not drink alcohol and does not use drugs. ? ?Family History:  ?Her family history includes Alcohol abuse in her maternal grandfather; Aneurysm in her mother; Arthritis in her maternal grandmother; Bipolar disorder in her mother and another family member; Hypertension in her mother and another family member; Kidney disease in her mother; Lung cancer in her father; Osteoporosis in her maternal grandmother; Stroke in her mother. ?  ? ? ?Assessment/Plan:  ? ?Narcolepsy w/o cataplexy. ?- sunosi was too expensive ?- continue dextroamphetamine 15 mg in the morning with 5 to 10 mg prn in the afternoon for now and she will email with information about which formulation to change to with next refill ?- discussed proper sleep hygiene ? ?Exercise induced asthma. ?- prn albuterol and warm up before exercise ?- prn honey for cough ? 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 ?- seen by Dr. Mila Homer at voice disorders center in [facility] ?- followed by speech therapy at [facility] ?  ?Depression. ?- she is followed by Dr. [name] ? ?CAD s/p CABG, HTN, HLD. ?- followed by Dr. Shelva Majestic with [facility] ? ?Time Spent Involved in Patient Care on Day of Examination:  ?36 minutes ? ?Follow up:  ? ?Patient Instructions  ?Email next  month when you are due for refill of your medicine ? ?Follow up in 6 months ? ?Medication List:  ? ?Allergies as of 03/29/2021   ? ?   Reactions  ? Penicillins Anaphylaxis  ? Has patient had a PCN reaction causing immediate rash, facial/tongue/throat swelling, SOB or lightheadedness with hypotension: Yes ?Has patient had a PCN reaction causing severe rash involving mucus membranes or skin necrosis: No ?Has patient had a PCN reaction that required hospitalization Yes ?Has patient had a PCN reaction occurring within the last 10 years: No ?If all of the above answers are "NO", then may  proceed with Cephalosporin use.  ? Pentazocine Other (See Comments)  ? Bextra [valdecoxib] Hives  ? Codeine Nausea And Vomiting  ? Lactose Intolerance (gi) Diarrhea  ? Meperidine Hcl Nausea And Vomiting  ? Morphine Nausea And Vomiting  ? Pentazocine Lactate   ? Muscle spasms   ? ?  ? ?  ?Medication List  ?  ? ?  ? Accurate as of March 29, 2021 10:28 AM. If you have any questions, ask your nurse or doctor.  ?  ?  ? ?  ? ?albuterol 108 (90 Base) MCG/ACT inhaler ?Commonly known as: VENTOLIN HFA ?Inhale 2 puffs into the lungs every 6 (six) hours as needed for wheezing or shortness of breath. ?  ?celecoxib 200 MG capsule ?Commonly known as: CELEBREX ?celecoxib 200 mg capsule ? Take 1 capsule every day by oral route as needed. ?  ?chlorthalidone 25 MG tablet ?Commonly known as: HYGROTON ?TAKE 1/2 TABLET BY MOUTH DAILY ?  ?dextroamphetamine 5 MG tablet ?Commonly known as: DEXTROSTAT ?Take 2 tablets (10 mg total) by mouth 2 (two) times daily. ?  ?famotidine 20 MG tablet ?Commonly known as: PEPCID ?Take 20 mg by mouth daily. ?  ?FLUoxetine 20 MG capsule ?Commonly known as: PROZAC ?Take 1 capsule (20 mg total) by mouth daily. ?  ?Multiple Vitamins/Womens tablet ?Take 1 tablet by mouth daily. ?  ?OVER THE COUNTER MEDICATION ?Collegen Peptid 11 grams daily ?  ?Sutab [phone] MG Tabs ?Generic drug: Sodium Sulfate-Mag Sulfate-KCl ?Use as directed for colonoscopy. MANUFACTURER CODES!! BIN: [id number] PCN: CN GROUP: [id number] MEMBER ID: [id number];[employer] AS SECONDARY INSURANCE ;NO PRIOR AUTHORIZATION ?  ?TYLENOL ARTHRITIS PAIN PO ?Take by mouth. ?  ?valsartan 320 MG tablet ?Commonly known as: DIOVAN ?Take 1 tablet (320 mg total) by mouth daily. ?  ?Vitamin D3 1.25 MG (50000 UT) Caps ?Vitamin D3 ?  ? ?  ? ? ?Signature:  ?[name], MD ?[place] ?Pager - [phone] - [phone] ?03/29/2021, 10:28 AM ?  ? ? ? ? ? ? ? ? ?

## 2021-04-02 ENCOUNTER — Encounter: Payer: Self-pay | Admitting: Internal Medicine

## 2021-04-02 ENCOUNTER — Ambulatory Visit (AMBULATORY_SURGERY_CENTER): Payer: Medicare Other | Admitting: Internal Medicine

## 2021-04-02 VITALS — BP 112/66 | HR 79 | Temp 97.8°F | Resp 18 | Ht 60.0 in | Wt 121.0 lb

## 2021-04-02 DIAGNOSIS — K5730 Diverticulosis of large intestine without perforation or abscess without bleeding: Secondary | ICD-10-CM | POA: Diagnosis not present

## 2021-04-02 DIAGNOSIS — R159 Full incontinence of feces: Secondary | ICD-10-CM

## 2021-04-02 DIAGNOSIS — D123 Benign neoplasm of transverse colon: Secondary | ICD-10-CM

## 2021-04-02 DIAGNOSIS — R194 Change in bowel habit: Secondary | ICD-10-CM

## 2021-04-02 MED ORDER — SODIUM CHLORIDE 0.9 % IV SOLN: 500.0000 mL | Freq: Once | INTRAVENOUS | Status: DC

## 2021-04-02 NOTE — Patient Instructions (Signed)
Impression/Recommendations: ? ?Polyp and diverticulosis handouts given to patient. ? ?Resume previous diet. ?Continue present medications. ?Await pathology results. ? ?Initiate fiber therapy.  If ineffective, laxative trial. ? ?No recommendation at this time for repeat colonoscopy. ? ?YOU HAD AN ENDOSCOPIC PROCEDURE TODAY AT [facility]:   Refer to the procedure report that was given to you for any specific questions about what was found during the examination.  If the procedure report does not answer your questions, please call your gastroenterologist to clarify.  If you requested that your care partner not be given the details of your procedure findings, then the procedure report has been included in a sealed envelope for you to review at your convenience later. ? ?YOU SHOULD EXPECT: Some feelings of bloating in the abdomen. Passage of more gas than usual.  Walking can help get rid of the air that was put into your GI tract during the procedure and reduce the bloating. If you had a lower endoscopy (such as a colonoscopy or flexible sigmoidoscopy) you may notice spotting of blood in your stool or on the toilet paper. If you underwent a bowel prep for your procedure, you may not have a normal bowel movement for a few days. ? ?Please Note:  You might notice some irritation and congestion in your nose or some drainage.  This is from the oxygen used during your procedure.  There is no need for concern and it should clear up in a day or so. ? ?SYMPTOMS TO REPORT IMMEDIATELY: ? ?Following lower endoscopy (colonoscopy or flexible sigmoidoscopy): ? Excessive amounts of blood in the stool ? Significant tenderness or worsening of abdominal pains ? Swelling of the abdomen that is new, acute ? Fever of 100?F or higher ? ? ?For urgent or emergent issues, a gastroenterologist can be reached at any hour by calling [phone]. ? ?Do not use MyChart messaging for urgent concerns.  ? ? ?DIET:  We do recommend a  small meal at first, but then you may proceed to your regular diet.  Drink plenty of fluids but you should avoid alcoholic beverages for 24 hours. ? ?ACTIVITY:  You should plan to take it easy for the rest of today and you should NOT DRIVE or use heavy machinery until tomorrow (because of the sedation medicines used during the test).   ? ?FOLLOW UP: ?Our staff will call the number listed on your records 48-72 hours following your procedure to check on you and address any questions or concerns that you may have regarding the information given to you following your procedure. If we do not reach you, we will leave a message.  We will attempt to reach you two times.  During this call, we will ask if you have developed any symptoms of COVID 19. If you develop any symptoms (ie: fever, flu-like symptoms, shortness of breath, cough etc.) before then, please call [phone].  If you test positive for Covid 19 in the 2 weeks post procedure, please call and report this information to [place].   ? ?If any biopsies were taken you will be contacted by phone or by letter within the next 1-3 weeks.  Please call us at [phone] if you have not heard about the biopsies in 3 weeks.  ? ? ?SIGNATURES/CONFIDENTIALITY: ?You and/or your care partner have signed paperwork which will be entered into your electronic medical record.  These signatures attest to the fact that that the information above on your After Visit Summary has been  reviewed and is understood.  Full responsibility of the confidentiality of this discharge information lies with you and/or your care-partner.  ? ? ?

## 2021-04-02 NOTE — Progress Notes (Signed)
Pt's states no medical or surgical changes since previsit or office visit. 

## 2021-04-02 NOTE — Op Note (Signed)
[place] ?Patient Name: Brandy [name] ?Procedure Date: 04/02/2021 3:29 PM ?MRN: [id number] ?Endoscopist: Jerene Bears , MD ?Age: 71 ?Referring MD:  ?Date of Birth: [birth year] ?Gender: Female ?Account #: [id number] ?Procedure:                Colonoscopy ?Indications:              Change in bowel habits, Fecal incontinence ?Medicines:                Monitored Anesthesia Care ?Procedure:                Pre-Anesthesia Assessment: ?                          - Prior to the procedure, a History and Physical  ?                          was performed, and patient medications and  ?                          allergies were reviewed. The patient's tolerance of  ?                          previous anesthesia was also reviewed. The risks  ?                          and benefits of the procedure and the sedation  ?                          options and risks were discussed with the patient.  ?                          All questions were answered, and informed consent  ?                          was obtained. Prior Anticoagulants: The patient has  ?                          taken no previous anticoagulant or antiplatelet  ?                          agents. ASA Grade Assessment: II - A patient with  ?                          mild systemic disease. After reviewing the risks  ?                          and benefits, the patient was deemed in  ?                          satisfactory condition to undergo the procedure. ?                          After obtaining informed consent, the colonoscope  ?  was passed under direct vision. Throughout the  ?                          procedure, the patient's blood pressure, pulse, and  ?                          oxygen saturations were monitored continuously. The  ?                          Olympus PCF-H190DL ([id number]) Colonoscope was  ?                          introduced through the anus and advanced to the  ?                          cecum, identified by  appendiceal orifice and  ?                          ileocecal valve. The colonoscopy was technically  ?                          difficult and complex due to multiple diverticula  ?                          in the colon and restricted mobility of the sigmoid  ?                          colon. Successful completion of the procedure was  ?                          aided by changing the patient to a supine position.  ?                          The patient tolerated the procedure well. The  ?                          quality of the bowel preparation was good. The  ?                          ileocecal valve, appendiceal orifice, and rectum  ?                          were photographed. ?Scope In: 3:43:06 PM ?Scope Out: 4:01:58 PM ?Scope Withdrawal Time: 0 hours 13 minutes 15 seconds  ?Total Procedure Duration: 0 hours 18 minutes 52 seconds  ?Findings:                 The digital rectal exam was normal. ?                          A 2 mm polyp was found in the proximal transverse  ?                          colon. The polyp was sessile. The polyp  was removed  ?                          with a cold snare. Resection and retrieval were  ?                          complete. ?                          Multiple small-mouthed diverticula were found in  ?                          the sigmoid colon. There is restricted mobility and  ?                          tortuosity in the sigmoid. ?                          The exam was otherwise without abnormality on  ?                          direct and retroflexion views. ?Complications:            No immediate complications. ?Estimated Blood Loss:     Estimated blood loss: none. ?Impression:               - One 2 mm polyp in the proximal transverse colon,  ?                          removed with a cold snare. Resected and retrieved. ?                          - Diverticulosis in the sigmoid colon. ?                          - The examination was otherwise normal on direct  ?                           and retroflexion views. ?Recommendation:           - Patient has a contact number available for  ?                          emergencies. The signs and symptoms of potential  ?                          delayed complications were discussed with the  ?                          patient. Return to normal activities tomorrow.  ?                          Written discharge instructions were provided to the  ?                          patient. ?                          -  Resume previous diet. ?                          - Continue present medications. ?                          - Await pathology results. ?                          - Based on sigmoid anatomy (as above) I expect her  ?                          bowel changes are more constipation related. Agree  ?                          with plan to start fiber therapy and if ineffective  ?                          laxative trial (would recommend this before  ?                          anorectal manometry or pelvic floor PT). ?                          - No recommendation at this time regarding repeat  ?                          colonoscopy due to age at next interval exam. ?Jerene Bears, MD ?04/02/2021 4:19:11 PM ?This report has been signed electronically. ?

## 2021-04-02 NOTE — Progress Notes (Signed)
A and O x3. Report to RN. Tolerated MAC anesthesia well. 

## 2021-04-02 NOTE — Progress Notes (Signed)
Called to room to assist during endoscopic procedure.  Patient ID and intended procedure confirmed with present staff. Received instructions for my participation in the procedure from the performing physician.  

## 2021-04-02 NOTE — Progress Notes (Signed)
See my office note dated 03/23/2021 for details ?Plan is for colonoscopy today to evaluate altered bowel habit and encopresis ?She remains appropriate for colonoscopy in the [facility] today ? ?

## 2021-04-06 ENCOUNTER — Telehealth: Payer: Self-pay

## 2021-04-06 NOTE — Telephone Encounter (Signed)
?  Follow up Call- ? ? ?  04/02/2021  ?  3:22 PM  ?Call back number  ?Post procedure Call Back phone  # [phone]  ?Permission to leave phone message Yes  ?  ? ?Patient questions: ? ?Do you have a fever, pain , or abdominal swelling? No. ?Pain Score  0 * ? ?Have you tolerated food without any problems? Yes.   ? ?Have you been able to return to your normal activities? Yes.   ? ?Do you have any questions about your discharge instructions: ?Diet   Yes.   ?Medications  Yes.   ?Follow up visit  No. ? ?Do you have questions or concerns about your Care? No. ? ?Actions: ?* If pain score is 4 or above: ?No action needed, pain <4. ? ? ?

## 2021-04-06 NOTE — Telephone Encounter (Signed)
Left message on follow up call. 

## 2021-04-08 ENCOUNTER — Encounter: Payer: Self-pay | Admitting: Internal Medicine

## 2021-04-13 ENCOUNTER — Encounter: Payer: Self-pay | Admitting: Internal Medicine

## 2021-04-13 ENCOUNTER — Encounter: Payer: Self-pay | Admitting: Pulmonary Disease

## 2021-04-13 NOTE — Telephone Encounter (Signed)
Dr. Halford Chessman, please advise on pt's email regarding her Dextroamphetamine refill. Pt last seen on 3/20 by Dr. Halford Chessman. OV notes below from last OV.  ? ?Dr. Halford Chessman, please advise. Thanks! ? ?----------------------------------------------------- ? ?Narcolepsy w/o cataplexy. ?- sunosi was too expensive ?- continue dextroamphetamine 15 mg in the morning with 5 to 10 mg prn in the afternoon for now and she will email with information about which formulation to change to with next refill ?

## 2021-04-14 MED ORDER — DEXTROAMPHETAMINE SULFATE 5 MG PO TABS: 5.0000 mg | ORAL_TABLET | Freq: Every day | ORAL | 0 refills | Status: DC

## 2021-04-14 MED ORDER — DEXTROAMPHETAMINE SULFATE ER 15 MG PO CP24: 15.0000 mg | ORAL_CAPSULE | Freq: Every morning | ORAL | 0 refills | Status: DC

## 2021-04-14 NOTE — Telephone Encounter (Signed)
Refill sent to [facility]. ?

## 2021-04-21 ENCOUNTER — Telehealth: Payer: Self-pay | Admitting: Pulmonary Disease

## 2021-04-21 NOTE — Telephone Encounter (Signed)
Patient called and is needing to speak with somone about her medication. Patient states that the pharmacy does not have what she is needing but they do have an alternative. Patient states she dont have enough medication and she is leaving town 4/14 [place] [phone] ?

## 2021-04-21 NOTE — Telephone Encounter (Signed)
Tried to call the pharmacy to see what medication had an alternative that was similar to the medication that pt was talking about but the pharmacy was closed until 2:30 for lunch. Will try to call back. ?

## 2021-04-21 NOTE — Telephone Encounter (Signed)
Called pt's pharmacy and spoke with the pharmacist who stated that they had an alternative to the Rx that was sent to the pharmacy for pt. States that medication that is similar to the Dexedrine Spansule '15mg'$  that they have in stock is Adderall extended release '15mg'$  capsule (NDC: [id number]) ? ? ? ?Routing this to Dr. Halford Chessman. Per pt, she is going to be going out of town 4/14. ?

## 2021-04-22 ENCOUNTER — Encounter: Payer: Self-pay | Admitting: Pulmonary Disease

## 2021-04-22 MED ORDER — AMPHETAMINE-DEXTROAMPHET ER 15 MG PO CP24: 15.0000 mg | ORAL_CAPSULE | ORAL | 0 refills | Status: DC

## 2021-04-22 NOTE — Telephone Encounter (Signed)
Dr. Melvyn Novas, would you be willing in send in the Adderall prescription for her since Dr. Halford Chessman is not available?  ?

## 2021-04-22 NOTE — Telephone Encounter (Signed)
I am on vacation this week and have no way to send controlled substance prescription.  This needs to be routed to provider of the day to send in prescription.  Okay to send script for adderall extended release 15 mg capsule daily, #30 with no refills. ?

## 2021-04-22 NOTE — Telephone Encounter (Signed)
Called patient but she did not answer. I sent a MyChart message to her to let her know the RX has been sent.  ? ?Will close this encounter.  ?

## 2021-04-22 NOTE — Telephone Encounter (Signed)
Done but make sure she stops the other forms of amphetamine when starts the new rx I filled today directly as per Dr Juanetta Gosling instructions  ?

## 2021-04-23 DIAGNOSIS — M1611 Unilateral primary osteoarthritis, right hip: Secondary | ICD-10-CM | POA: Diagnosis not present

## 2021-04-23 DIAGNOSIS — M25551 Pain in right hip: Secondary | ICD-10-CM | POA: Diagnosis not present

## 2021-04-23 DIAGNOSIS — Z96642 Presence of left artificial hip joint: Secondary | ICD-10-CM | POA: Diagnosis not present

## 2021-05-19 ENCOUNTER — Encounter: Payer: Self-pay | Admitting: Pulmonary Disease

## 2021-05-19 NOTE — Telephone Encounter (Signed)
Please advise on mychart.  ?

## 2021-05-20 MED ORDER — AMPHETAMINE-DEXTROAMPHET ER 15 MG PO CP24: 15.0000 mg | ORAL_CAPSULE | ORAL | 0 refills | Status: DC

## 2021-05-20 MED ORDER — DEXTROAMPHETAMINE SULFATE 5 MG PO TABS: 5.0000 mg | ORAL_TABLET | Freq: Every day | ORAL | 0 refills | Status: DC

## 2021-05-20 NOTE — Telephone Encounter (Signed)
Refill sent.

## 2021-05-24 ENCOUNTER — Encounter: Payer: Self-pay | Admitting: Pulmonary Disease

## 2021-05-24 MED ORDER — DEXTROAMPHETAMINE SULFATE 5 MG PO TABS: 15.0000 mg | ORAL_TABLET | Freq: Every morning | ORAL | 0 refills | Status: DC

## 2021-05-24 NOTE — Telephone Encounter (Signed)
Dr. Halford Chessman please advise on the the unavailability of ER capsules mentions in My Chart thread.  ?

## 2021-05-24 NOTE — Telephone Encounter (Signed)
Please let her know I have sent a script to [facility] for dextroamphetamine (dextrostat) 5 mg pill, 15 mg daily in the morning. ? ?Please contact her pharmacy to let them know the cancel script for adderall xr 15 mg daily. ?

## 2021-05-24 NOTE — Telephone Encounter (Signed)
Pt notified of refill via My Chart and pharmacy confirmed Adderall XR cancel. Nothing further needed at this time.  ?

## 2021-05-25 ENCOUNTER — Telehealth: Payer: Self-pay

## 2021-05-25 NOTE — Telephone Encounter (Signed)
? ?  Pre-operative Risk Assessment  ?  ?Patient Name: Brandy [name]  ?DOB: 05-28-1950 ?MRN: [id number]  ? ?  ? ?Request for Surgical Clearance   ? ?Procedure:   Right Total Hip Arthroplasty ? ?Date of Surgery:  Clearance 07/27/21                              ?   ?Surgeon:  Dr. Paralee Cancel ?Surgeon's Group or Practice Name:  [facility] ?Phone number:  [phone] ?Fax number:  [phone] ?  ?Type of Clearance Requested:   ?- Medical  ?  ?Type of Anesthesia:  Spinal ?  ?Additional requests/questions:   ? ?Signed, ?Jacqulynn Cadet   ?05/25/2021, 3:04 PM  ? ?

## 2021-05-26 NOTE — Telephone Encounter (Signed)
? ?  Patient Name: Brandy [name]  ?DOB: June 19, 1950 ?MRN: [id number] ? ?Primary Cardiologist: Shelva Majestic, MD ? ?Chart reviewed as part of pre-operative protocol coverage.  Patient has an upcoming visit with Caron Presume, PA, on 06/09/2021 at which time clearance can be addressed in case there are any issues that would impact surgical recommendations. ? ?Right total hip arthroplasty scheduled for 07/28/2021 as noted below. I added preop FYI to appointment notes that provider is aware to address at the time of outpatient visit.  Per office protocol the cardiology provider should for their finalize clearance decision and recommendations regarding antiplatelet therapy to the requesting party below. ? ?I will route this message as FYI to requesting party remove this message from the preop box as separate preop APP input not needed at this time.  Please call with any questions. ? ? ?Brandy Sciara, NP ?05/26/2021, 1:43 PM ? ? ?

## 2021-05-27 DIAGNOSIS — Z23 Encounter for immunization: Secondary | ICD-10-CM | POA: Diagnosis not present

## 2021-05-30 ENCOUNTER — Encounter: Payer: Self-pay | Admitting: Family Medicine

## 2021-06-08 NOTE — Progress Notes (Unsigned)
Cardiology Office Note:    Date:  06/09/2021   ID:  Brandy [name], DOB [birth year], MRN [id number]  PCP:  Haydee Salter, MD [place] Cardiologist: Shelva Majestic, MD   Reason for visit: Preop clearance for right total hip replacement on 07/27/2021  Surgeon:  Dr. Paralee Cancel Surgeon's Group or Practice Name:  [facility] Phone number:  [phone] Fax number:  [phone]  History of Present Illness:    Brandy [name] is a 71 y.o. 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  She last saw Dr. Claiborne Billings November 2021 without angina.  Today, she feels well from a cardiovascular standpoint.  She states limited activity for last 2 months secondary to right hip pain.  Right hip surgery scheduled for July 18.  She states prior to the last 2 months, she was rowing on water twice a week and pushing her mower without significant chest pain or shortness of breath.  She denies PND, orthopnea, lower extreme edema, palpitations, lightheadedness and syncope.  She states she is out of her chlorthalidone and her valsartan.  She stopped taking Crestor secondary to concerns of aches and pains.  Though she thinks that may be due to her history of sports and arthritis.  We talked about other lipid medications including Repatha/Praluent and Inclisiran.  She is willing to really try Crestor.   Past Medical History:  Diagnosis Date   Arthritis    Bipolar 1 disorder ([place])    CAD (coronary artery disease) 2007   CABG X 1   COVID 03/04/2021   Depression    controlled   Hay fever    Headache    Hx of migraines   Heart murmur    Hyperlipidemia    Hypertension    Idiopathic hypersomnia    Mitral valve prolapse    PONV (postoperative nausea and vomiting)    Urinary incontinence     Past Surgical History:  Procedure Laterality Date   cataract surgery     BIL   CORONARY ARTERY BYPASS GRAFT   03/21/05   off pump LIMA-LAD   myomectomy  1981   reconstructive surgery  1945   lip    ROTATOR CUFF REPAIR  1998   3 times   SHOULDER ARTHROSCOPY  2006   TOTAL ABDOMINAL HYSTERECTOMY  1991   TOTAL HIP ARTHROPLASTY Left 03/01/2016   Procedure: LEFT TOTAL HIP ARTHROPLASTY ANTERIOR APPROACH;  Surgeon: Paralee Cancel, MD;  Location: [facility];  Service: Orthopedics;  Laterality: Left;  Requests 70 mins   trach     VESICOVAGINAL FISTULA CLOSURE W/ TAH      Current Medications: Current Meds  Medication Sig   Acetaminophen (TYLENOL ARTHRITIS PAIN PO) Take by mouth.   albuterol (VENTOLIN HFA) 108 (90 Base) MCG/ACT inhaler Inhale 2 puffs into the lungs every 6 (six) hours as needed for wheezing or shortness of breath.   celecoxib (CELEBREX) 200 MG capsule celecoxib 200 mg capsule  Take 1 capsule every day by oral route as needed.   Cholecalciferol (VITAMIN D3) 1.25 MG (50000 UT) CAPS Vitamin D3   dextroamphetamine (DEXTROSTAT) 5 MG tablet Take 1 tablet (5 mg total) by mouth daily after lunch.   dextroamphetamine (DEXTROSTAT) 5 MG tablet Take 3 tablets (15 mg total) by mouth every morning.   famotidine (PEPCID) 20 MG tablet Take 20 mg by mouth daily.   FLUoxetine (PROZAC) 20 MG capsule Take 1 capsule (20 mg  total) by mouth daily.   Multiple Vitamins-Minerals (MULTIPLE VITAMINS/WOMENS) tablet Take 1 tablet by mouth daily.   OVER THE COUNTER MEDICATION Collegen Peptid 11 grams daily   rosuvastatin (CRESTOR) 40 MG tablet Take 1 tablet (40 mg total) by mouth daily.   [DISCONTINUED] chlorthalidone (HYGROTON) 25 MG tablet TAKE 1/2 TABLET BY MOUTH DAILY   [DISCONTINUED] valsartan (DIOVAN) 320 MG tablet Take 1 tablet (320 mg total) by mouth daily.     Allergies:   Penicillins, Pentazocine, Bextra [valdecoxib], Codeine, Lactose intolerance (gi), Meperidine hcl, Morphine, and Pentazocine lactate   Social History   Socioeconomic History   Marital status: Divorced    Spouse name: Not on file   Number of  children: 0   Years of education: Not on file   Highest education level: Not on file  Occupational History   Occupation: retired  Tobacco Use   Smoking status: Never   Smokeless tobacco: Never  Vaping Use   Vaping Use: Never used  Substance and Sexual Activity   Alcohol use: No   Drug use: No   Sexual activity: Yes  Other Topics Concern   Not on file  Social History Narrative   Not on file   Social Determinants of Health   Financial Resource Strain: Not on file  Food Insecurity: Not on file  Transportation Needs: Not on file  Physical Activity: Not on file  Stress: Not on file  Social Connections: Not on file     Family History: The patient's family history includes Alcohol abuse in her maternal grandfather; Aneurysm in her mother; Arthritis in her maternal grandmother; Bipolar disorder in her mother and another family member; Hypertension in her mother and another family member; Kidney disease in her mother; Lung cancer in her father; Osteoporosis in her maternal grandmother; Stroke in her mother.  ROS:   Please see the history of present illness.     EKGs/Labs/Other Studies Reviewed:    EKG:  The ekg ordered today demonstrates normal sinus rhythm, heart rate 73.  Recent Labs: No results found for requested labs within last 8760 hours.   Recent Lipid Panel Lab Results  Component Value Date/Time   CHOL 193 11/26/2019 10:02 AM   TRIG 178 (H) 11/26/2019 10:02 AM   HDL 74 11/26/2019 10:02 AM   LDLCALC 89 11/26/2019 10:02 AM    Physical Exam:    VS:  BP (!) 132/98   Pulse 73   Ht 5' (1.524 m)   Wt 115 lb 9.6 oz (52.4 kg)   LMP 01/10/1989 (Approximate)   SpO2 97%   BMI 22.58 kg/m    No data found.  Wt Readings from Last 3 Encounters:  06/09/21 115 lb 9.6 oz (52.4 kg)  04/02/21 121 lb (54.9 kg)  03/29/21 112 lb 3.2 oz (50.9 kg)     GEN:  Well nourished, well developed in no acute distress HEENT: Normal NECK: No JVD; No carotid bruits CARDIAC: RRR, no  murmurs, rubs, gallops RESPIRATORY:  Clear to auscultation without rales, wheezing or rhonchi  ABDOMEN: Soft, non-tender, non-distended MUSCULOSKELETAL: No edema; No deformity  SKIN: Warm and dry NEUROLOGIC:  Alert and oriented PSYCHIATRIC:  Normal affect    ASSESSMENT AND PLAN   Preop [id number]} Ms. Penrod's perioperative risk of a major cardiac event is 0.9% according to the Revised Cardiac Risk Index (RCRI).  Therefore, she is at low risk for perioperative complications.   Her functional capacity is excellent at 7.59 METs according to the Duke Activity Status Index (DASI).  Recommendations: According to ACC/AHA guidelines, no further cardiovascular testing needed.  The patient may proceed to surgery at acceptable risk.   Antiplatelet and/or Anticoagulation Recommendations: She is not on any antiplatelets or anticoagulation.  Coronary artery disease with no angina -CABG 2007 with LIMA to LAD -Recommended to re-trial Crestor 40 mg daily.  Her LDL previously was 176 and the Crestor brought down to 89 on last check in November 2021. -If she has significant myalgias, recommend pharmacist referral for consideration of PCSK9 inhibitors or Inclisiran.  Hypertension, BP elevated -BP 158/85, repeat 132/98. -Patient ran out of her blood pressure medications.  Refill chlorthalidone and valsartan.  Recheck BMET. -History of lower extremity edema with amlodipine 10 mg. -Goal BP is <130/80.  Recommend DASH diet (high in vegetables, fruits, low-fat dairy products, whole grains, poultry, fish, and nuts and low in sweets, sugar-sweetened beverages, and red meats), salt restriction and increase physical activity.  Hyperlipidemia with goal LDL less than 70 -Check fasting lipids.  Restart Crestor 40 mg daily.  Then repeat fasting lipids in 3 months. -Discussed cholesterol lowering diets - Mediterranean diet, DASH diet, vegetarian diet, low-carbohydrate diet and avoidance of trans fats.   Discussed healthier choice substitutes.  Nuts, high-fiber foods, and fiber supplements may also improve lipids.    Disposition - Follow-up in 6 months with Dr. Margaretann Loveless.   Medication Adjustments/Labs and Tests Ordered: Current medicines are reviewed at length with the patient today.  Concerns regarding medicines are outlined above.  Orders Placed This Encounter  Procedures   CBC   Comprehensive metabolic panel   Lipid panel   Lipid panel   EKG 12-Lead   Meds ordered this encounter  Medications   valsartan (DIOVAN) 320 MG tablet    Sig: Take 1 tablet (320 mg total) by mouth daily.    Dispense:  90 tablet    Refill:  3   chlorthalidone (HYGROTON) 25 MG tablet    Sig: Take 0.5 tablets (12.5 mg total) by mouth daily.    Dispense:  90 tablet    Refill:  3   rosuvastatin (CRESTOR) 40 MG tablet    Sig: Take 1 tablet (40 mg total) by mouth daily.    Dispense:  90 tablet    Refill:  3    Patient Instructions  Medication Instructions:  Restart Crestor 40 mg ( Take 1 Tablet Daily). *If you need a refill on your cardiac medications before your next appointment, please call your pharmacy*   Lab Work: CBC, CMET, Lipid Panel : Today Lipid Panel : To Be Done In 3 Months. If you have labs (blood work) drawn today and your tests are completely normal, you will receive your results only by: [place] (if you have MyChart) OR A paper copy in the mail If you have any lab test that is abnormal or we need to change your treatment, we will call you to review the results.   Testing/Procedures: No Testing   Follow-Up: At [facility], you and your health needs are our priority.  As part of our continuing mission to provide you with exceptional heart care, we have created designated Provider Care Teams.  These Care Teams include your primary Cardiologist (physician) and Advanced Practice Providers (APPs -  Physician Assistants and Nurse Practitioners) who all work together to provide  you with the care you need, when you need it.  We recommend signing up for the patient portal called "MyChart".  Sign up information is provided on this After Visit Summary.  MyChart is used to connect with patients for Virtual Visits (Telemedicine).  Patients are able to view lab/test results, encounter notes, upcoming appointments, etc.  Non-urgent messages can be sent to your provider as well.   To learn more about what you can do with MyChart, go to [email or web address].    Your next appointment:   6 month(s)  The format for your next appointment:   In Person  Provider:   Cherlynn Kaiser, MD      Important Information About Sugar         Signed, Warren Lacy, PA-C  06/09/2021 10:32 AM    [place]

## 2021-06-09 ENCOUNTER — Ambulatory Visit (INDEPENDENT_AMBULATORY_CARE_PROVIDER_SITE_OTHER): Payer: Medicare Other | Admitting: Physician Assistant

## 2021-06-09 ENCOUNTER — Encounter: Payer: Self-pay | Admitting: Physician Assistant

## 2021-06-09 VITALS — BP 132/98 | HR 73 | Ht 60.0 in | Wt 115.6 lb

## 2021-06-09 DIAGNOSIS — I2510 Atherosclerotic heart disease of native coronary artery without angina pectoris: Secondary | ICD-10-CM

## 2021-06-09 DIAGNOSIS — I2583 Coronary atherosclerosis due to lipid rich plaque: Secondary | ICD-10-CM

## 2021-06-09 DIAGNOSIS — I10 Essential (primary) hypertension: Secondary | ICD-10-CM | POA: Diagnosis not present

## 2021-06-09 DIAGNOSIS — Z01818 Encounter for other preprocedural examination: Secondary | ICD-10-CM | POA: Diagnosis not present

## 2021-06-09 DIAGNOSIS — E785 Hyperlipidemia, unspecified: Secondary | ICD-10-CM

## 2021-06-09 MED ORDER — ROSUVASTATIN CALCIUM 40 MG PO TABS: 40.0000 mg | ORAL_TABLET | Freq: Every day | ORAL | 3 refills | Status: DC

## 2021-06-09 MED ORDER — VALSARTAN 320 MG PO TABS: 320.0000 mg | ORAL_TABLET | Freq: Every day | ORAL | 3 refills | Status: DC

## 2021-06-09 MED ORDER — CHLORTHALIDONE 25 MG PO TABS: 12.5000 mg | ORAL_TABLET | Freq: Every day | ORAL | 3 refills | Status: DC

## 2021-06-09 NOTE — Patient Instructions (Signed)
Medication Instructions:  Restart Crestor 40 mg ( Take 1 Tablet Daily). *If you need a refill on your cardiac medications before your next appointment, please call your pharmacy*   Lab Work: CBC, CMET, Lipid Panel : Today Lipid Panel : To Be Done In 3 Months. If you have labs (blood work) drawn today and your tests are completely normal, you will receive your results only by: [place] (if you have MyChart) OR A paper copy in the mail If you have any lab test that is abnormal or we need to change your treatment, we will call you to review the results.   Testing/Procedures: No Testing   Follow-Up: At [facility], you and your health needs are our priority.  As part of our continuing mission to provide you with exceptional heart care, we have created designated Provider Care Teams.  These Care Teams include your primary Cardiologist (physician) and Advanced Practice Providers (APPs -  Physician Assistants and Nurse Practitioners) who all work together to provide you with the care you need, when you need it.  We recommend signing up for the patient portal called "MyChart".  Sign up information is provided on this After Visit Summary.  MyChart is used to connect with patients for Virtual Visits (Telemedicine).  Patients are able to view lab/test results, encounter notes, upcoming appointments, etc.  Non-urgent messages can be sent to your provider as well.   To learn more about what you can do with MyChart, go to [email or web address].    Your next appointment:   6 month(s)  The format for your next appointment:   In Person  Provider:   Cherlynn Kaiser, MD      Important Information About Sugar

## 2021-06-28 ENCOUNTER — Encounter: Payer: Self-pay | Admitting: Pulmonary Disease

## 2021-06-28 MED ORDER — DEXTROAMPHETAMINE SULFATE 5 MG PO TABS: 20.0000 mg | ORAL_TABLET | Freq: Every morning | ORAL | 0 refills | Status: DC

## 2021-06-28 NOTE — Telephone Encounter (Signed)
Refill sent.

## 2021-06-28 NOTE — Telephone Encounter (Signed)
Dr. Halford Chessman, please see mychart message from pt.

## 2021-06-29 ENCOUNTER — Ambulatory Visit (INDEPENDENT_AMBULATORY_CARE_PROVIDER_SITE_OTHER): Payer: Medicare Other | Admitting: Family Medicine

## 2021-06-29 VITALS — BP 122/66 | HR 86 | Temp 97.4°F | Ht 60.0 in | Wt 113.4 lb

## 2021-06-29 DIAGNOSIS — I2510 Atherosclerotic heart disease of native coronary artery without angina pectoris: Secondary | ICD-10-CM

## 2021-06-29 DIAGNOSIS — M1611 Unilateral primary osteoarthritis, right hip: Secondary | ICD-10-CM | POA: Diagnosis not present

## 2021-06-29 DIAGNOSIS — I2583 Coronary atherosclerosis due to lipid rich plaque: Secondary | ICD-10-CM | POA: Diagnosis not present

## 2021-06-29 DIAGNOSIS — R159 Full incontinence of feces: Secondary | ICD-10-CM | POA: Insufficient documentation

## 2021-06-29 DIAGNOSIS — E7849 Other hyperlipidemia: Secondary | ICD-10-CM | POA: Diagnosis not present

## 2021-06-29 DIAGNOSIS — Z01818 Encounter for other preprocedural examination: Secondary | ICD-10-CM

## 2021-06-29 DIAGNOSIS — G4711 Idiopathic hypersomnia with long sleep time: Secondary | ICD-10-CM

## 2021-06-29 DIAGNOSIS — I10 Essential (primary) hypertension: Secondary | ICD-10-CM | POA: Diagnosis not present

## 2021-06-29 LAB — COMPREHENSIVE METABOLIC PANEL
ALT: 12 U/L (ref 0–35)
AST: 19 U/L (ref 0–37)
Albumin: 4.5 g/dL (ref 3.5–5.2)
Alkaline Phosphatase: 110 U/L (ref 39–117)
BUN: 31 mg/dL — ABNORMAL HIGH (ref 6–23)
CO2: 26 mEq/L (ref 19–32)
Calcium: 9.6 mg/dL (ref 8.4–10.5)
Chloride: 103 mEq/L (ref 96–112)
Creatinine, Ser: 0.93 mg/dL (ref 0.40–1.20)
GFR: 61.13 mL/min (ref >60.00)
Glucose, Bld: 114 mg/dL — ABNORMAL HIGH (ref 70–99)
Potassium: 3.9 mEq/L (ref 3.5–5.1)
Sodium: 139 mEq/L (ref 135–145)
Total Bilirubin: 0.7 mg/dL (ref 0.2–1.2)
Total Protein: 6.8 g/dL (ref 6.0–8.3)

## 2021-06-29 LAB — CBC
HCT: 40.4 % (ref 36.0–46.0)
Hemoglobin: 13.4 g/dL (ref 12.0–15.0)
MCHC: 33.3 g/dL (ref 30.0–36.0)
MCV: 90.3 fl (ref 78.0–100.0)
Platelets: 257 10*3/uL (ref 150.0–400.0)
RBC: 4.47 Mil/uL (ref 3.87–5.11)
RDW: 12.7 % (ref 11.5–15.5)
WBC: 7.5 10*3/uL (ref 4.0–10.5)

## 2021-06-29 NOTE — Progress Notes (Signed)
[facility] [address] [place] [address] Dept: [phone] Dept Fax: [phone]  Transfer of Care Office Visit  Subjective:    Patient ID: Brandy [name], female    DOB: 14-Aug-1950, 71 y.o..   MRN: [id number]  Chief Complaint  Patient presents with   [place] care.  Needs surgery clearance for total  RT hip surgery in July.    History of Present Illness:  Patient is in today to establish care. Ms. Loncar was born in [place], [place]. Her family moved to [facility] when she was 31. She attended college at the [employer] majoring in Nutritional therapist). 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 She is single and has no children. Ms. Shrestha remains very active overall, esp. engaging in rowing and paddle boarding. She denies any tobacco or drug use and very rarely drinks alcohol.  Ms. Regner has a history of having had a single-vessel CABG. She has a history of hypertension and is managed on valsartan 320 mg daily and chlorthalidone 25 mg daily. She has hyperlipidemia and is managed on rosuvastatin 40 mg daily.  Ms. Speedy has had a number of orthopedic issues. This has included three surgeries on her right shoulder for rotator cuff with residual osteoarthritis and having had a prior total left hip replacement. She is currently scheduled in July for a total right hip joint replacement. Part of today's visit is for a surgical clearance. She has already been seen by cardiology and been cleared from a cardiac standpoint. She currentl;y takes Celecoxib to manage pain issues.  Ms. Harshbarger has a history fo episodic depression. She is managed on fluoxetine 20 mg daily.  Ms. Storey has a history of narcolepsy. She is managed by Dr. Halford Chessman on dextroamphetamine 15 mg q am and 5 mg q pm PRN.  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   Laryngospasms 04/22/2020   Shoulder pain, right 03/12/2020   Numbness and tingling in both hands 06/20/2018   Rosacea 08/24/2017   S/P left THA, AA 03/01/2016   S/P hip replacement, left 03/01/2016   Circadian rhythm sleep disorder, 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   Hyperlipidemia    Idiopathic hypersomnia    CAD (coronary artery disease)    Hypertension    Past Surgical History:  Procedure Laterality Date   cataract surgery     BIL   CORONARY ARTERY BYPASS GRAFT  03/21/05   off pump LIMA-LAD   myomectomy  1981   reconstructive surgery  1945   lip    ROTATOR CUFF REPAIR  1998   3 times   SHOULDER ARTHROSCOPY  2006   TOTAL ABDOMINAL HYSTERECTOMY  1991   TOTAL HIP ARTHROPLASTY Left 03/01/2016   Procedure: LEFT TOTAL HIP ARTHROPLASTY ANTERIOR APPROACH;  Surgeon: Paralee Cancel, MD;  Location: [facility];  Service: Orthopedics;  Laterality: Left;  Requests 70 mins   trach     VESICOVAGINAL FISTULA CLOSURE W/ TAH     Family History  Problem Relation Age of Onset   Stroke Mother        died 23   Aneurysm Mother    Hypertension Mother  Kidney disease Mother    Bipolar disorder Mother    Lung cancer Father        died 43   Bipolar disorder Other    Hypertension Other    Osteoporosis Maternal Grandmother    Arthritis Maternal Grandmother    Alcohol abuse Maternal Grandfather    Outpatient Medications Prior to Visit  Medication Sig Dispense Refill   Acetaminophen (TYLENOL ARTHRITIS PAIN PO) Take by mouth.     celecoxib (CELEBREX) 200 MG capsule celecoxib 200 mg capsule  Take 1 capsule every day by oral route as needed.     chlorthalidone (HYGROTON) 25 MG tablet Take 0.5 tablets (12.5 mg total) by mouth  daily. 90 tablet 3   Cholecalciferol (VITAMIN D3) 1.25 MG (50000 UT) CAPS Vitamin D3     dextroamphetamine (DEXTROSTAT) 5 MG tablet Take 4 tablets (20 mg total) by mouth every morning. 120 tablet 0   famotidine (PEPCID) 20 MG tablet Take 20 mg by mouth daily.     FLUoxetine (PROZAC) 20 MG capsule Take 1 capsule (20 mg total) by mouth daily. 90 capsule 3   Multiple Vitamins-Minerals (MULTIPLE VITAMINS/WOMENS) tablet Take 1 tablet by mouth daily.     OVER THE COUNTER MEDICATION Collegen Peptid 11 grams daily     Psyllium 48.57 % POWD      rosuvastatin (CRESTOR) 40 MG tablet Take 1 tablet (40 mg total) by mouth daily. 90 tablet 3   valsartan (DIOVAN) 320 MG tablet Take 1 tablet (320 mg total) by mouth daily. 90 tablet 3   No facility-administered medications prior to visit.   Allergies  Allergen Reactions   Penicillins Anaphylaxis    Has patient had a PCN reaction causing immediate rash, facial/tongue/throat swelling, SOB or lightheadedness with hypotension: Yes Has patient had a PCN reaction causing severe rash involving mucus membranes or skin necrosis: No Has patient had a PCN reaction that required hospitalization Yes Has patient had a PCN reaction occurring within the last 10 years: No If all of the above answers are "NO", then may proceed with Cephalosporin use.   Pentazocine Other (See Comments)   Bextra [Valdecoxib] Hives   Codeine Nausea And Vomiting   Lactose Intolerance (Gi) Diarrhea   Meperidine Hcl Nausea And Vomiting   Morphine Nausea And Vomiting   Pentazocine Lactate     Muscle spasms     Objective:   Today's Vitals   06/29/21 1321  BP: 122/66  Pulse: 86  Temp: (!) 97.4 F (36.3 C)  TempSrc: Temporal  SpO2: 94%  Weight: 113 lb 6.4 oz (51.4 kg)  Height: 5' (1.524 m)   Body mass index is 22.15 kg/m.   General: Well developed, well nourished. No acute distress. HEENT: Normocephalic, non-traumatic. PERRL, EOMI. Conjunctiva clear. External ears normal.   EAC  and TMs normal bilaterally. Nose  clear without congestion or rhinorrhea. Mucous membranes   moist. Oropharynx clear. Good dentition. Neck: Supple. No lymphadenopathy. No thyromegaly. Lungs: Clear to auscultation bilaterally. No wheezing, rales or rhonchi. CV: RRR without murmurs or rubs. Pulses 2+ bilaterally. Abdomen: Soft, non-tender. Bowel sounds positive, normal pitch and frequency. No   hepatosplenomegaly. No rebound or guarding. Extremities: Moderate crepitance int he right shoulder joint. Pain indicated over lateral aspect of right   hip joint. Skin: Warm and dry. No rashes. Psych: Alert and oriented. Normal mood and affect.  Health Maintenance Due  Topic Date Due   Zoster Vaccines- Shingrix (1 of 2) Never done   MAMMOGRAM  09/26/2020  Assessment & Plan:   1. Preoperative examination Reviewed cardiology consult note and their cardiac clearance for her surgery. I will check a CBC and CMP. If acceptable values, I will plan to clear her for her orthopedic surgery.  - CBC - Comprehensive metabolic panel  2. Primary osteoarthritis of right hip As above.  3. Coronary artery disease due to lipid rich plaque Stable without angina. Continue focus on lipid management.  4. Primary hypertension Blood pressure is in excellent control today. Continue valsartan 320 mg daily and chlorthalidone 25 mg daily.  5. Other hyperlipidemia Due for repeat lipids, as ordered by cardiology. Continue rosuvastatin 40 mg daily.  6. Idiopathic hypersomnia Continue to follow with Dr. Halford Chessman for management of dextroamphetamine.  Return in about 3 months (around 09/29/2021) for Reassessment.   Haydee Salter, MD

## 2021-06-30 ENCOUNTER — Telehealth: Payer: Self-pay | Admitting: Family Medicine

## 2021-06-30 NOTE — Telephone Encounter (Signed)
Noted. Dm/cma  

## 2021-06-30 NOTE — Telephone Encounter (Signed)
Sherry from dr. Alvan Dame office called and stated that she did receive the medical clearance is good to go... she called at 2:20pm today

## 2021-07-09 DIAGNOSIS — Z01818 Encounter for other preprocedural examination: Secondary | ICD-10-CM | POA: Diagnosis not present

## 2021-07-09 DIAGNOSIS — I10 Essential (primary) hypertension: Secondary | ICD-10-CM | POA: Diagnosis not present

## 2021-07-09 DIAGNOSIS — E785 Hyperlipidemia, unspecified: Secondary | ICD-10-CM | POA: Diagnosis not present

## 2021-07-09 DIAGNOSIS — I2510 Atherosclerotic heart disease of native coronary artery without angina pectoris: Secondary | ICD-10-CM | POA: Diagnosis not present

## 2021-07-09 DIAGNOSIS — I2583 Coronary atherosclerosis due to lipid rich plaque: Secondary | ICD-10-CM | POA: Diagnosis not present

## 2021-07-10 LAB — LIPID PANEL
Chol/HDL Ratio: 2.2 ratio (ref 0.0–4.4)
Cholesterol, Total: 150 mg/dL (ref 100–199)
HDL: 67 mg/dL (ref >39)
LDL Chol Calc (NIH): 62 mg/dL (ref 0–99)
Triglycerides: 120 mg/dL (ref 0–149)
VLDL Cholesterol Cal: 21 mg/dL (ref 5–40)

## 2021-07-10 LAB — CBC
Hematocrit: 40.5 % (ref 34.0–46.6)
Hemoglobin: 13.7 g/dL (ref 11.1–15.9)
MCH: 30.1 pg (ref 26.6–33.0)
MCHC: 33.8 g/dL (ref 31.5–35.7)
MCV: 89 fL (ref 79–97)
Platelets: 284 10*3/uL (ref 150–450)
RBC: 4.55 x10E6/uL (ref 3.77–5.28)
RDW: 12.4 % (ref 11.7–15.4)
WBC: 6.9 10*3/uL (ref 3.4–10.8)

## 2021-07-10 LAB — COMPREHENSIVE METABOLIC PANEL
ALT: 14 IU/L (ref 0–32)
AST: 18 IU/L (ref 0–40)
Albumin/Globulin Ratio: 2.5 — ABNORMAL HIGH (ref 1.2–2.2)
Albumin: 4.8 g/dL — ABNORMAL HIGH (ref 3.7–4.7)
Alkaline Phosphatase: 113 IU/L (ref 44–121)
BUN/Creatinine Ratio: 31 — ABNORMAL HIGH (ref 12–28)
BUN: 32 mg/dL — ABNORMAL HIGH (ref 8–27)
Bilirubin Total: 0.6 mg/dL (ref 0.0–1.2)
CO2: 24 mmol/L (ref 20–29)
Calcium: 9.8 mg/dL (ref 8.7–10.3)
Chloride: 101 mmol/L (ref 96–106)
Creatinine, Ser: 1.04 mg/dL — ABNORMAL HIGH (ref 0.57–1.00)
Globulin, Total: 1.9 g/dL (ref 1.5–4.5)
Glucose: 100 mg/dL — ABNORMAL HIGH (ref 70–99)
Potassium: 4.7 mmol/L (ref 3.5–5.2)
Sodium: 141 mmol/L (ref 134–144)
Total Protein: 6.7 g/dL (ref 6.0–8.5)
eGFR: 57 mL/min/{1.73_m2} — ABNORMAL LOW (ref >59)

## 2021-07-14 ENCOUNTER — Telehealth: Payer: Self-pay

## 2021-07-14 NOTE — Telephone Encounter (Addendum)
Called patient regarding results. Left detailed message for patient regarding results.----- Message from Warren Lacy, PA-C sent at 07/13/2021  7:25 AM EDT ----- Kidney function stable.  Potassium normal. Liver function normal. Blood counts normal. Cholesterol improved -triglycerides down from 178 to 120.  LDL improved from 89 to 62.  LDL is at goal.  --Continue current therapy.

## 2021-07-15 ENCOUNTER — Telehealth: Payer: Self-pay | Admitting: Physician Assistant

## 2021-07-15 NOTE — Telephone Encounter (Signed)
Spoke to patient lab results given.Advised to continue same medications.

## 2021-07-15 NOTE — Telephone Encounter (Signed)
Follow Up:    Patient is returning  a call from yesterday, concerning her results. 

## 2021-07-19 ENCOUNTER — Telehealth: Payer: Self-pay

## 2021-07-19 NOTE — Progress Notes (Signed)
Anesthesia Review:  PCP: stephen rudd- L[facility] 06/29/21- for preop eval Clearance dated 6/21/232 on chart  Cardiologist : Dr Ellouise Newer [facility] 06/09/21 with Gayatri Acharya,MD on chart for clearance.   Chest x-ray : EKG : 06/09/21  Echo : 4/021  Stress test:2017  Cardiac Cath :  Activity level: can do a flight of stairs without difficulty  Sleep Study/ CPAP : 06/18/20- Sleep study Fasting Blood Sugar :      / Checks Blood Sugar -- times a day:   Blood Thinner/ Instructions /Last Dose: ASA / Instructions/ Last Dose :   07/09/21- cbc and cmp - in epic

## 2021-07-19 NOTE — Telephone Encounter (Addendum)
Results seen by patient via MyChart.----- Message from Warren Lacy, PA-C sent at 07/13/2021  7:25 AM EDT ----- Kidney function stable.  Potassium normal. Liver function normal. Blood counts normal. Cholesterol improved -triglycerides down from 178 to 120.  LDL improved from 89 to 62.  LDL is at goal.  --Continue current therapy.

## 2021-07-20 NOTE — Progress Notes (Signed)
DUE TO COVID-19 ONLY ONE VISITOR IS ALLOWED TO COME WITH YOU AND STAY IN THE WAITING ROOM ONLY DURING PRE OP AND PROCEDURE DAY OF SURGERY.  2 VISITOR  MAY VISIT WITH YOU AFTER SURGERY IN YOUR PRIVATE ROOM DURING VISITING HOURS ONLY! YOU MAY HAVE ONE PERSON SPEND THE NITE WITH YOU IN YOUR ROOM AFTER SURGERY.     Your procedure is scheduled on:       07/27/2021   Report to [facility] Main  Entrance   Report to admitting at   0900am              AM DO NOT BRING INSUra  NCE CARD, PICTURE ID OR WALLET DAY OF SURGERY.      Call this number if you have problems the morning of surgery [phone]    REMEMBER: NO  SOLID FOODS , CANDY, GUM OR MINTS AFTER [place] .       Marland Kitchen CLEAR LIQUIDS UNTIL      0830am           DAY OF SURGERY.      PLEASE FINISH ENSURE DRINK PER SURGEON ORDER  WHICH NEEDS TO BE COMPLETED AT    0830am      MORNING OF SURGERY.       CLEAR LIQUID DIET   Foods Allowed      WATER BLACK COFFEE ( SUGAR OK, NO MILK, CREAM OR CREAMER) REGULAR AND DECAF  TEA ( SUGAR OK NO MILK, CREAM, OR CREAMER) REGULAR AND DECAF  PLAIN JELLO ( NO RED)  FRUIT ICES ( NO RED, NO FRUIT PULP)  POPSICLES ( NO RED)  JUICE- APPLE, WHITE GRAPE AND WHITE CRANBERRY  SPORT DRINK LIKE GATORADE ( NO RED)  CLEAR BROTH ( VEGETABLE , CHICKEN OR BEEF)                                                                     BRUSH YOUR TEETH MORNING OF SURGERY AND RINSE YOUR MOUTH OUT, NO CHEWING GUM CANDY OR MINTS.     Take these medicines the morning of surgery with A SIP OF WATER:  none    DO NOT TAKE ANY DIABETIC MEDICATIONS DAY OF YOUR SURGERY                               You may not have any metal on your body including hair pins and              piercings  Do not wear jewelry, make-up, lotions, powders or perfumes, deodorant             Do not wear nail polish on your fingernails.              IF YOU ARE A FEMALE AND WANT TO SHAVE UNDER ARMS OR LEGS PRIOR TO SURGERY YOU  MUST DO SO AT LEAST 48 HOURS PRIOR TO SURGERY.              Men may shave face and neck.   Do not bring valuables to the hospital. [place]  FOR VALUABLES.  Contacts, dentures or bridgework may not be worn into surgery.  Leave suitcase in the car. After surgery it may be brought to your room.     Patients discharged the day of surgery will not be allowed to drive home. IF YOU ARE HAVING SURGERY AND GOING HOME THE SAME DAY, YOU MUST HAVE AN ADULT TO DRIVE YOU HOME AND BE WITH YOU FOR 24 HOURS. YOU MAY GO HOME BY TAXI OR UBER OR ORTHERWISE, BUT AN ADULT MUST ACCOMPANY YOU HOME AND STAY WITH YOU FOR 24 HOURS.                Please read over the following fact sheets you were given: _____________________________________________________________________  [facility] - Preparing for Surgery Before surgery, you can play an important role.  Because skin is not sterile, your skin needs to be as free of germs as possible.  You can reduce the number of germs on your skin by washing with CHG (chlorahexidine gluconate) soap before surgery.  CHG is an antiseptic cleaner which kills germs and bonds with the skin to continue killing germs even after washing. Please DO NOT use if you have an allergy to CHG or antibacterial soaps.  If your skin becomes reddened/irritated stop using the CHG and inform your nurse when you arrive at Short Stay. Do not shave (including legs and underarms) for at least 48 hours prior to the first CHG shower.  You may shave your face/neck. Please follow these instructions carefully:  1.  Shower with CHG Soap the night before surgery and the  morning of Surgery.  2.  If you choose to wash your hair, wash your hair first as usual with your  normal  shampoo.  3.  After you shampoo, rinse your hair and body thoroughly to remove the  shampoo.                           4.  Use CHG as you would any other liquid soap.  You can apply chg directly  to the skin  and wash                       Gently with a scrungie or clean washcloth.  5.  Apply the CHG Soap to your body ONLY FROM THE NECK DOWN.   Do not use on face/ open                           Wound or open sores. Avoid contact with eyes, ears mouth and genitals (private parts).                       Wash face,  Genitals (private parts) with your normal soap.             6.  Wash thoroughly, paying special attention to the area where your surgery  will be performed.  7.  Thoroughly rinse your body with warm water from the neck down.  8.  DO NOT shower/wash with your normal soap after using and rinsing off  the CHG Soap.                9.  Pat yourself dry with a clean towel.            10.  Wear clean pajamas.  11.  Place clean sheets on your bed the night of your first shower and do not  sleep with pets. Day of Surgery : Do not apply any lotions/deodorants the morning of surgery.  Please wear clean clothes to the hospital/surgery center.  FAILURE TO FOLLOW THESE INSTRUCTIONS MAY RESULT IN THE CANCELLATION OF YOUR SURGERY PATIENT SIGNATURE_________________________________  NURSE SIGNATURE__________________________________  ________________________________________________________________________

## 2021-07-21 ENCOUNTER — Encounter (HOSPITAL_COMMUNITY): Payer: Self-pay | Admitting: Orthopedic Surgery

## 2021-07-21 ENCOUNTER — Encounter (HOSPITAL_COMMUNITY)
Admission: RE | Admit: 2021-07-21 | Discharge: 2021-07-21 | Disposition: A | Discharge status: Home / Self Care | Payer: Medicare Other | Source: Ambulatory Visit | Attending: Orthopedic Surgery | Admitting: Orthopedic Surgery

## 2021-07-21 ENCOUNTER — Encounter (HOSPITAL_COMMUNITY): Payer: Self-pay

## 2021-07-21 ENCOUNTER — Other Ambulatory Visit: Payer: Self-pay

## 2021-07-21 VITALS — BP 130/77 | HR 77 | Temp 98.7°F | Resp 16 | Ht 60.0 in | Wt 111.0 lb

## 2021-07-21 DIAGNOSIS — I10 Essential (primary) hypertension: Secondary | ICD-10-CM | POA: Diagnosis not present

## 2021-07-21 DIAGNOSIS — Z01812 Encounter for preprocedural laboratory examination: Secondary | ICD-10-CM | POA: Insufficient documentation

## 2021-07-21 DIAGNOSIS — Z951 Presence of aortocoronary bypass graft: Secondary | ICD-10-CM | POA: Diagnosis not present

## 2021-07-21 DIAGNOSIS — I2510 Atherosclerotic heart disease of native coronary artery without angina pectoris: Secondary | ICD-10-CM | POA: Diagnosis not present

## 2021-07-21 DIAGNOSIS — M1611 Unilateral primary osteoarthritis, right hip: Secondary | ICD-10-CM | POA: Insufficient documentation

## 2021-07-21 DIAGNOSIS — I341 Nonrheumatic mitral (valve) prolapse: Secondary | ICD-10-CM | POA: Diagnosis not present

## 2021-07-21 DIAGNOSIS — Z01818 Encounter for other preprocedural examination: Secondary | ICD-10-CM

## 2021-07-21 HISTORY — DX: Gastro-esophageal reflux disease without esophagitis: K21.9

## 2021-07-21 HISTORY — DX: Anxiety disorder, unspecified: F41.9

## 2021-07-21 HISTORY — DX: Fibromyalgia: M79.7

## 2021-07-21 LAB — SURGICAL PCR SCREEN
MRSA, PCR: NEGATIVE
Staphylococcus aureus: NEGATIVE

## 2021-07-22 NOTE — Anesthesia Preprocedure Evaluation (Addendum)
Anesthesia Evaluation  Patient identified by MRN, date of birth, ID band Patient awake    Reviewed: Allergy & Precautions, NPO status , Patient's Chart, lab work & pertinent test results  History of Anesthesia Complications (+) PONV and history of anesthetic complications  Airway Mallampati: II  TM Distance: >3 FB Neck ROM: Limited    Dental  (+) Dental Advisory Given, Caps   Pulmonary neg pulmonary ROS,    Pulmonary exam normal        Cardiovascular hypertension, Pt. on medications + CAD and + CABG  Normal cardiovascular exam     Neuro/Psych  Headaches, PSYCHIATRIC DISORDERS Anxiety Depression Bipolar Disorder  Narcolepsy     GI/Hepatic Neg liver ROS, GERD  Medicated and Controlled,  Endo/Other  negative endocrine ROS  Renal/GU negative Renal ROS     Musculoskeletal  (+) Arthritis , Fibromyalgia -  Abdominal   Peds  Hematology negative hematology ROS (+)   Anesthesia Other Findings   Reproductive/Obstetrics                           Anesthesia Physical Anesthesia Plan  ASA: 3  Anesthesia Plan: Spinal   Post-op Pain Management: Tylenol PO (pre-op)*   Induction:   PONV Risk Score and Plan: 3 and Treatment may vary due to age or medical condition, Propofol infusion and Ondansetron  Airway Management Planned: Natural Airway and Simple Face Mask  Additional Equipment: None  Intra-op Plan:   Post-operative Plan:   Informed Consent: I have reviewed the patients History and Physical, chart, labs and discussed the procedure including the risks, benefits and alternatives for the proposed anesthesia with the patient or authorized representative who has indicated his/her understanding and acceptance.       Plan Discussed with: CRNA and Anesthesiologist  Anesthesia Plan Comments: (Labs reviewed, platelets acceptable. Discussed risks and benefits of spinal, including  spinal/epidural hematoma, infection, failed block, and PDPH. Patient expressed understanding and wished to proceed. )      Anesthesia Quick Evaluation

## 2021-07-22 NOTE — Progress Notes (Signed)
Anesthesia Chart Review   Case: [id number] Date/Time: 07/27/21 1115   Procedure: TOTAL HIP ARTHROPLASTY ANTERIOR APPROACH (Right: Hip)   Anesthesia type: Spinal   Pre-op diagnosis: Right hip osteoarthritis   Location: [facility] ROOM 10 / [facility]   Surgeons: Paralee Cancel, MD       DISCUSSION:71 y.o. never smoker with h/o PONV, HTN, CAD (CABG 2007), MVP, right hip OA scheduled for above procedure 07/27/2021 With Dr. Paralee Cancel.   Pt seen by cardiology 06/09/2021. Per OV note, "Ms. Adorno's perioperative risk of a major cardiac event is 0.9% according to the Revised Cardiac Risk Index (RCRI).  Therefore, she is at low risk for perioperative complications.   Her functional capacity is excellent at 7.59 METs according to the Duke Activity Status Index (DASI). Recommendations: According to ACC/AHA guidelines, no further cardiovascular testing needed.  The patient may proceed to surgery at acceptable risk.   Antiplatelet and/or Anticoagulation Recommendations: She is not on any antiplatelets or anticoagulation."  Anticipate pt can proceed with planned procedure barring acute status change.   VS: BP 130/77   Pulse 77   Temp 37.1 C (Oral)   Resp 16   Ht 5' (1.524 m)   Wt 50.3 kg   LMP 01/10/1989 (Approximate)   SpO2 100%   BMI 21.68 kg/m   PROVIDERS: Haydee Salter, MD is PCP   Shelva Majestic, MD is Cardiologist  LABS: Labs reviewed: Acceptable for surgery. (all labs ordered are listed, but only abnormal results are displayed)  Labs Reviewed  SURGICAL PCR SCREEN  TYPE AND SCREEN     IMAGES:   EKG: 06/09/2021 Rate 73 bpm  NSR  CV: Echo 05/01/2019 1. Left ventricular ejection fraction, by estimation, is 70 to 75%. The  left ventricle has hyperdynamic function. The left ventricle has no  regional wall motion abnormalities. Left ventricular diastolic parameters  are indeterminate.   2. Right ventricular systolic function is normal. The right ventricular  size is normal. There  is normal pulmonary artery systolic pressure.   3. The mitral valve is grossly normal. Trivial mitral valve  regurgitation. No evidence of mitral stenosis.   4. The aortic valve is tricuspid. Aortic valve regurgitation is not  visualized. Mild aortic valve sclerosis is present, with no evidence of  aortic valve stenosis.   5. The inferior vena cava is normal in size with greater than 50%  respiratory variability, suggesting right atrial pressure of 3 mmHg.  Past Medical History:  Diagnosis Date   Anxiety    Arthritis    Bipolar 1 disorder ([place])    CAD (coronary artery disease) 2007   CABG X 1   COVID 03/04/2021   Depression    controlled   Fibromyalgia    GERD (gastroesophageal reflux disease)    Hay fever    Headache    Hx of migraines   Heart murmur    Hyperlipidemia    Hypertension    Idiopathic hypersomnia    Mitral valve prolapse    Narcolepsy    PONV (postoperative nausea and vomiting)    Urinary incontinence     Past Surgical History:  Procedure Laterality Date   cataract surgery     BIL   CORONARY ARTERY BYPASS GRAFT  03/21/05   off pump LIMA-LAD   myomectomy  1981   reconstructive surgery  1945   lip    ROTATOR CUFF REPAIR  1998   3 times   SHOULDER ARTHROSCOPY  2006   TOTAL ABDOMINAL HYSTERECTOMY  1991   TOTAL HIP ARTHROPLASTY Left 03/01/2016   Procedure: LEFT TOTAL HIP ARTHROPLASTY ANTERIOR APPROACH;  Surgeon: Paralee Cancel, MD;  Location: [facility];  Service: Orthopedics;  Laterality: Left;  Requests 70 mins   trach     VESICOVAGINAL FISTULA CLOSURE W/ TAH      MEDICATIONS:  acetaminophen (TYLENOL) 650 MG CR tablet   celecoxib (CELEBREX) 200 MG capsule   chlorthalidone (HYGROTON) 25 MG tablet   Cholecalciferol (VITAMIN D3 PO)   COLLAGEN-VITAMIN C-BIOTIN PO   dextroamphetamine (DEXTROSTAT) 5 MG tablet   famotidine (PEPCID) 10 MG tablet   FLUoxetine (PROZAC) 20 MG capsule   Multiple Vitamin (MULTIVITAMIN WITH MINERALS) TABS tablet   psyllium  (METAMUCIL SMOOTH TEXTURE) 28 % packet   rosuvastatin (CRESTOR) 40 MG tablet   traMADol (ULTRAM) 50 MG tablet   valsartan (DIOVAN) 320 MG tablet   No current facility-administered medications for this encounter.    Konrad Felix Ward, PA-C [facility] [phone]

## 2021-07-26 NOTE — H&P (Signed)
TOTAL HIP ADMISSION H&P  Patient is admitted for right total hip arthroplasty.  Subjective:  Chief Complaint: right hip pain  HPI: Brandy [name], 71 y.o. female, has a history of pain and functional disability in the right hip(s) due to arthritis and patient has failed non-surgical conservative treatments for greater than 12 weeks to include NSAID's and/or analgesics, corticosteriod injections, and activity modification.  Onset of symptoms was gradual starting 2 years ago with gradually worsening course since that time.The patient noted no past surgery on the right hip(s).  Patient currently rates pain in the right hip at 8 out of 10 with activity. Patient has worsening of pain with activity and weight bearing, pain that interfers with activities of daily living, and pain with passive range of motion. Patient has evidence of joint space narrowing by imaging studies. This condition presents safety issues increasing the risk of falls.  There is no current active infection.  Patient Active Problem List   Diagnosis Date Noted   Osteoarthritis of right hip 06/29/2021   Alteration in bowel elimination: incontinence 06/29/2021   Seborrheic keratosis 12/07/2020   Actinic keratosis 12/07/2020   Cervical radiculopathy 12/07/2020   Snoring 06/11/2020   Chronic hoarseness 04/22/2020   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   Laryngospasms 04/22/2020   Shoulder pain, right 03/12/2020   Numbness and tingling in both hands 06/20/2018   Rosacea 08/24/2017   S/P left THA, AA 03/01/2016   S/P hip replacement, left 03/01/2016   Circadian rhythm sleep disorder, 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   Hyperlipidemia    Idiopathic hypersomnia    CAD (coronary artery disease)    Hypertension    Past Medical History:  Diagnosis Date   Anxiety    Arthritis    Bipolar 1 disorder ([place])    CAD (coronary artery disease) 2007   CABG X 1   COVID 03/04/2021   Depression    controlled   Fibromyalgia     GERD (gastroesophageal reflux disease)    Hay fever    Headache    Hx of migraines   Heart murmur    Hyperlipidemia    Hypertension    Idiopathic hypersomnia    Mitral valve prolapse    Narcolepsy    PONV (postoperative nausea and vomiting)    Urinary incontinence     Past Surgical History:  Procedure Laterality Date   cataract surgery     BIL   CORONARY ARTERY BYPASS GRAFT  03/21/05   off pump LIMA-LAD   myomectomy  1981   reconstructive surgery  1945   lip    ROTATOR CUFF REPAIR  1998   3 times   SHOULDER ARTHROSCOPY  2006   TOTAL ABDOMINAL HYSTERECTOMY  1991   TOTAL HIP ARTHROPLASTY Left 03/01/2016   Procedure: LEFT TOTAL HIP ARTHROPLASTY ANTERIOR APPROACH;  Surgeon: Paralee Cancel, MD;  Location[facility];  Service: Orthopedics;  Laterality: Left;  Requests 70 mins   trach     VESICOVAGINAL FISTULA CLOSURE W/ TAH      No current facility-administered medications for this encounter.   Current Outpatient Medications  Medication Sig Dispense Refill Last Dose   acetaminophen (TYLENOL) 650 MG CR tablet Take 1,300 mg by mouth in the morning and at bedtime.      celecoxib (CELEBREX) 200 MG capsule Take 200 mg by mouth at bedtime.      chlorthalidone (HYGROTON) 25 MG tablet Take 0.5 tablets (12.5 mg total) by mouth daily. 90 tablet 3  dextroamphetamine (DEXTROSTAT) 5 MG tablet Take 4 tablets (20 mg total) by mouth every morning. (Patient taking differently: Take 5-15 mg by mouth See admin instructions. Take 3 tablets (15 mg) by mouth (scheduled) every morning & may take an additional 1 tablet (5 mg) in the afternoon for drowsiness.) 120 tablet 0    famotidine (PEPCID) 10 MG tablet Take 10 mg by mouth at bedtime.      FLUoxetine (PROZAC) 20 MG capsule Take 1 capsule (20 mg total) by mouth daily. (Patient taking differently: Take 20 mg by mouth at bedtime.) 90 capsule 3    Multiple Vitamin (MULTIVITAMIN WITH MINERALS) TABS tablet Take 1 tablet by mouth daily with supper. Nature's  Way Alive! Once Daily Women's 50+      psyllium (METAMUCIL SMOOTH TEXTURE) 28 % packet Take 1 packet by mouth in the morning.      rosuvastatin (CRESTOR) 40 MG tablet Take 1 tablet (40 mg total) by mouth daily. (Patient taking differently: Take 40 mg by mouth at bedtime.) 90 tablet 3    traMADol (ULTRAM) 50 MG tablet Take 50 mg by mouth 3 (three) times daily as needed (severe hip pain.).      valsartan (DIOVAN) 320 MG tablet Take 1 tablet (320 mg total) by mouth daily. (Patient taking differently: Take 320 mg by mouth at bedtime.) 90 tablet 3    Cholecalciferol (VITAMIN D3 PO) Take 2,000 Units by mouth daily with supper.      COLLAGEN-VITAMIN C-BIOTIN PO Take 3 capsules by mouth daily with supper.      Allergies  Allergen Reactions   Penicillins Anaphylaxis    Has patient had a PCN reaction causing immediate rash, facial/tongue/throat swelling, SOB or lightheadedness with hypotension: Yes Has patient had a PCN reaction causing severe rash involving mucus membranes or skin necrosis: No Has patient had a PCN reaction that required hospitalization Yes Has patient had a PCN reaction occurring within the last 10 years: No If all of the above answers are "NO", then may proceed with Cephalosporin use.   Talwin [Pentazocine] Other (See Comments)    Uncontrolled shaking/tremors   Bextra [Valdecoxib] Hives   Codeine Nausea And Vomiting   Lactose Intolerance (Gi) Diarrhea   Meperidine Hcl Nausea And Vomiting   Morphine Nausea And Vomiting    Social History   Tobacco Use   Smoking status: Never   Smokeless tobacco: Never  Substance Use Topics   Alcohol use: No    Family History  Problem Relation Age of Onset   Stroke Mother        died 77   Aneurysm Mother    Hypertension Mother    Kidney disease Mother    Bipolar disorder Mother    Lung cancer Father        died 35   Bipolar disorder Other    Hypertension Other    Osteoporosis Maternal Grandmother    Arthritis Maternal Grandmother     Alcohol abuse Maternal Grandfather      Review of Systems  Constitutional:  Negative for chills and fever.  Respiratory:  Negative for cough and shortness of breath.   Cardiovascular:  Negative for chest pain.  Gastrointestinal:  Negative for nausea and vomiting.  Musculoskeletal:  Positive for arthralgias.     Objective:  Physical Exam Well nourished and well developed. General: Alert and oriented x3, cooperative and pleasant, no acute distress. Head: normocephalic, atraumatic, neck supple. Eyes: EOMI.  Musculoskeletal: Right hip exam: She does demonstrate to me maintenance of  active hip flexion of 130 degrees without significant external rotation contracture Passive range of motion does reproduce pain with hip flexion internal rotation over 10 degrees and external rotation close to 30 degrees She is neurovascular intact distally Her left hip exam following arthroplasty remains normal with active hip flexion of 140 degrees with passive range of motion is pain-free   Calves soft and nontender. Motor function intact in LE. Strength 5/5 LE bilaterally. Neuro: Distal pulses 2+. Sensation to light touch intact in LE.  Vital signs in last 24 hours:    Labs:   Estimated body mass index is 21.68 kg/m as calculated from the following:   Height as of 07/21/21: 5' (1.524 m).   Weight as of 07/21/21: 50.3 kg.   Imaging Review Plain radiographs demonstrate severe degenerative joint disease of the right hip(s). The bone quality appears to be adequate for age and reported activity level.      Assessment/Plan:  End stage arthritis, right hip(s)  The patient history, physical examination, clinical judgement of the provider and imaging studies are consistent with end stage degenerative joint disease of the right hip(s) and total hip arthroplasty is deemed medically necessary. The treatment options including medical management, injection therapy, arthroscopy and arthroplasty were  discussed at length. The risks and benefits of total hip arthroplasty were presented and reviewed. The risks due to aseptic loosening, infection, stiffness, dislocation/subluxation,  thromboembolic complications and other imponderables were discussed.  The patient acknowledged the explanation, agreed to proceed with the plan and consent was signed. Patient is being admitted for inpatient treatment for surgery, pain control, PT, OT, prophylactic antibiotics, VTE prophylaxis, progressive ambulation and ADL's and discharge planning.The patient is planning to be discharged  home.  Therapy Plans: HEP Disposition: Home alone with friends checking in Planned DVT Prophylaxis: aspirin '81mg'$  BID DME needed: none PCP: Dr. Gena Fray, clearance received Cardiologist: Dr. Claiborne Billings, clearance received TXA: IV Allergies: PCN - anaphylaxis at age 87 with emergency trach, codeine - N/V Anesthesia Concerns: none BMI: 21.8 Last HgbA1c: Not diabetic   Other: - hydromorphone (hx of N/V), robaxin, tylenol, celebrex    Costella Hatcher, PA-C Orthopedic Surgery [facility] [place] [phone]

## 2021-07-27 ENCOUNTER — Ambulatory Visit (HOSPITAL_COMMUNITY): Payer: Medicare Other

## 2021-07-27 ENCOUNTER — Other Ambulatory Visit: Payer: Self-pay

## 2021-07-27 ENCOUNTER — Observation Stay (HOSPITAL_COMMUNITY)
Admission: RE | Admit: 2021-07-27 | Discharge: 2021-07-28 | Disposition: A | Discharge status: Home / Self Care | Payer: Medicare Other | Attending: Orthopedic Surgery | Admitting: Orthopedic Surgery

## 2021-07-27 ENCOUNTER — Ambulatory Visit (HOSPITAL_COMMUNITY): Payer: Medicare Other | Admitting: Physician Assistant

## 2021-07-27 ENCOUNTER — Observation Stay (HOSPITAL_COMMUNITY): Payer: Medicare Other

## 2021-07-27 ENCOUNTER — Encounter (HOSPITAL_COMMUNITY)
Admission: RE | Disposition: A | Discharge status: Home / Self Care | Payer: Self-pay | Source: Home / Self Care | Attending: Orthopedic Surgery

## 2021-07-27 ENCOUNTER — Encounter (HOSPITAL_COMMUNITY): Payer: Self-pay | Admitting: Orthopedic Surgery

## 2021-07-27 ENCOUNTER — Ambulatory Visit (HOSPITAL_BASED_OUTPATIENT_CLINIC_OR_DEPARTMENT_OTHER): Payer: Medicare Other | Admitting: Anesthesiology

## 2021-07-27 DIAGNOSIS — F418 Other specified anxiety disorders: Secondary | ICD-10-CM | POA: Diagnosis not present

## 2021-07-27 DIAGNOSIS — Z951 Presence of aortocoronary bypass graft: Secondary | ICD-10-CM | POA: Insufficient documentation

## 2021-07-27 DIAGNOSIS — I2510 Atherosclerotic heart disease of native coronary artery without angina pectoris: Secondary | ICD-10-CM | POA: Diagnosis not present

## 2021-07-27 DIAGNOSIS — Z8616 Personal history of COVID-19: Secondary | ICD-10-CM | POA: Diagnosis not present

## 2021-07-27 DIAGNOSIS — M1611 Unilateral primary osteoarthritis, right hip: Principal | ICD-10-CM | POA: Insufficient documentation

## 2021-07-27 DIAGNOSIS — Z96642 Presence of left artificial hip joint: Secondary | ICD-10-CM | POA: Insufficient documentation

## 2021-07-27 DIAGNOSIS — Z471 Aftercare following joint replacement surgery: Secondary | ICD-10-CM | POA: Diagnosis not present

## 2021-07-27 DIAGNOSIS — Z96641 Presence of right artificial hip joint: Secondary | ICD-10-CM

## 2021-07-27 DIAGNOSIS — M797 Fibromyalgia: Secondary | ICD-10-CM | POA: Diagnosis not present

## 2021-07-27 DIAGNOSIS — Z79899 Other long term (current) drug therapy: Secondary | ICD-10-CM | POA: Insufficient documentation

## 2021-07-27 DIAGNOSIS — I10 Essential (primary) hypertension: Secondary | ICD-10-CM | POA: Diagnosis not present

## 2021-07-27 DIAGNOSIS — Z01818 Encounter for other preprocedural examination: Secondary | ICD-10-CM

## 2021-07-27 HISTORY — PX: TOTAL HIP ARTHROPLASTY: SHX124

## 2021-07-27 HISTORY — DX: Narcolepsy without cataplexy: G47.419

## 2021-07-27 LAB — TYPE AND SCREEN
ABO/RH(D): A POS
Antibody Screen: NEGATIVE

## 2021-07-27 SURGERY — ARTHROPLASTY, HIP, TOTAL, ANTERIOR APPROACH
Anesthesia: Spinal | Site: Hip | Laterality: Right

## 2021-07-27 MED ORDER — MENTHOL 3 MG MT LOZG: 1.0000 | LOZENGE | OROMUCOSAL | Status: DC | PRN

## 2021-07-27 MED ORDER — GLYCOPYRROLATE PF 0.2 MG/ML IJ SOSY
PREFILLED_SYRINGE | INTRAMUSCULAR | Status: DC | PRN
  Administered 2021-07-27: .2 mg via INTRAVENOUS

## 2021-07-27 MED ORDER — ONDANSETRON HCL 4 MG/2ML IJ SOLN
INTRAMUSCULAR | Status: DC | PRN
  Administered 2021-07-27: 4 mg via INTRAVENOUS

## 2021-07-27 MED ORDER — OXYCODONE HCL 5 MG/5ML PO SOLN: 5.0000 mg | Freq: Once | ORAL | Status: AC | PRN

## 2021-07-27 MED ORDER — EPHEDRINE SULFATE-NACL 50-0.9 MG/10ML-% IV SOSY
PREFILLED_SYRINGE | INTRAVENOUS | Status: DC | PRN
  Administered 2021-07-27: 5 mg via INTRAVENOUS

## 2021-07-27 MED ORDER — BISACODYL 10 MG RE SUPP: 10.0000 mg | Freq: Every day | RECTAL | Status: DC | PRN

## 2021-07-27 MED ORDER — POVIDONE-IODINE 10 % EX SWAB: Freq: Once | CUTANEOUS | Status: AC

## 2021-07-27 MED ORDER — POLYETHYLENE GLYCOL 3350 17 G PO PACK
17.0000 g | PACK | Freq: Every day | ORAL | Status: DC | PRN
Start: 2021-07-27 — End: 2021-07-28

## 2021-07-27 MED ORDER — ASPIRIN 81 MG PO CHEW
81.0000 mg | CHEWABLE_TABLET | Freq: Two times a day (BID) | ORAL | Status: DC
  Administered 2021-07-27 – 2021-07-28 (×2): 81 mg via ORAL
  Filled 2021-07-27 (×2): qty 1

## 2021-07-27 MED ORDER — OXYCODONE HCL 5 MG PO TABS
ORAL_TABLET | ORAL | Status: AC
  Filled 2021-07-27: qty 1

## 2021-07-27 MED ORDER — CHLORTHALIDONE 25 MG PO TABS
12.5000 mg | ORAL_TABLET | Freq: Every day | ORAL | Status: DC
  Administered 2021-07-28: 12.5 mg via ORAL
  Filled 2021-07-27: qty 1

## 2021-07-27 MED ORDER — ONDANSETRON HCL 4 MG PO TABS: 4.0000 mg | ORAL_TABLET | Freq: Four times a day (QID) | ORAL | Status: DC | PRN

## 2021-07-27 MED ORDER — SODIUM CHLORIDE 0.9 % IR SOLN
Status: DC | PRN
  Administered 2021-07-27: 1000 mL

## 2021-07-27 MED ORDER — SODIUM CHLORIDE 0.9 % IV SOLN: INTRAVENOUS | Status: DC

## 2021-07-27 MED ORDER — ORAL CARE MOUTH RINSE: 15.0000 mL | Freq: Once | OROMUCOSAL | Status: AC

## 2021-07-27 MED ORDER — TRANEXAMIC ACID-NACL 1000-0.7 MG/100ML-% IV SOLN
1000.0000 mg | INTRAVENOUS | Status: AC
  Administered 2021-07-27: 1000 mg via INTRAVENOUS
  Filled 2021-07-27: qty 100

## 2021-07-27 MED ORDER — PROPOFOL 10 MG/ML IV BOLUS
INTRAVENOUS | Status: DC | PRN
  Administered 2021-07-27: 30 mg via INTRAVENOUS
  Administered 2021-07-27: 20 mg via INTRAVENOUS

## 2021-07-27 MED ORDER — ACETAMINOPHEN 500 MG PO TABS
1000.0000 mg | ORAL_TABLET | Freq: Once | ORAL | Status: DC
  Filled 2021-07-27: qty 2

## 2021-07-27 MED ORDER — OXYCODONE HCL 5 MG PO TABS
5.0000 mg | ORAL_TABLET | Freq: Once | ORAL | Status: AC | PRN
  Administered 2021-07-27: 5 mg via ORAL

## 2021-07-27 MED ORDER — PROPOFOL 1000 MG/100ML IV EMUL
INTRAVENOUS | Status: AC
  Filled 2021-07-27: qty 100

## 2021-07-27 MED ORDER — METOCLOPRAMIDE HCL 5 MG PO TABS: 5.0000 mg | ORAL_TABLET | Freq: Three times a day (TID) | ORAL | Status: DC | PRN

## 2021-07-27 MED ORDER — ONDANSETRON HCL 4 MG/2ML IJ SOLN
4.0000 mg | Freq: Four times a day (QID) | INTRAMUSCULAR | Status: DC | PRN
  Administered 2021-07-27: 4 mg via INTRAVENOUS
  Filled 2021-07-27: qty 2

## 2021-07-27 MED ORDER — FERROUS SULFATE 325 (65 FE) MG PO TABS
325.0000 mg | ORAL_TABLET | Freq: Three times a day (TID) | ORAL | Status: DC
  Administered 2021-07-27 – 2021-07-28 (×2): 325 mg via ORAL
  Filled 2021-07-27 (×2): qty 1

## 2021-07-27 MED ORDER — DIPHENHYDRAMINE HCL 12.5 MG/5ML PO ELIX: 12.5000 mg | ORAL_SOLUTION | ORAL | Status: DC | PRN

## 2021-07-27 MED ORDER — HYDROMORPHONE HCL 2 MG PO TABS
1.0000 mg | ORAL_TABLET | ORAL | Status: DC | PRN
  Administered 2021-07-27 – 2021-07-28 (×3): 2 mg via ORAL
  Filled 2021-07-27 (×4): qty 1

## 2021-07-27 MED ORDER — FENTANYL CITRATE (PF) 100 MCG/2ML IJ SOLN
INTRAMUSCULAR | Status: DC | PRN
  Administered 2021-07-27 (×2): 25 ug via INTRAVENOUS
  Administered 2021-07-27: 50 ug via INTRAVENOUS

## 2021-07-27 MED ORDER — FENTANYL CITRATE (PF) 100 MCG/2ML IJ SOLN
INTRAMUSCULAR | Status: AC
  Filled 2021-07-27: qty 2

## 2021-07-27 MED ORDER — PROPOFOL 500 MG/50ML IV EMUL
INTRAVENOUS | Status: DC | PRN
  Administered 2021-07-27: 50 ug/kg/min via INTRAVENOUS

## 2021-07-27 MED ORDER — HYDROMORPHONE HCL 2 MG PO TABS
2.0000 mg | ORAL_TABLET | ORAL | Status: DC | PRN
  Administered 2021-07-28: 2 mg via ORAL

## 2021-07-27 MED ORDER — DEXAMETHASONE SODIUM PHOSPHATE 10 MG/ML IJ SOLN
INTRAMUSCULAR | Status: DC | PRN
  Administered 2021-07-27: 10 mg

## 2021-07-27 MED ORDER — LACTATED RINGERS IV SOLN: INTRAVENOUS | Status: DC

## 2021-07-27 MED ORDER — METHOCARBAMOL 500 MG IVPB - SIMPLE MED: 500.0000 mg | Freq: Four times a day (QID) | INTRAVENOUS | Status: DC | PRN

## 2021-07-27 MED ORDER — DEXAMETHASONE SODIUM PHOSPHATE 10 MG/ML IJ SOLN: 8.0000 mg | Freq: Once | INTRAMUSCULAR | Status: DC

## 2021-07-27 MED ORDER — CELECOXIB 200 MG PO CAPS
200.0000 mg | ORAL_CAPSULE | Freq: Two times a day (BID) | ORAL | Status: DC
  Administered 2021-07-27: 200 mg via ORAL
  Filled 2021-07-27: qty 1

## 2021-07-27 MED ORDER — VANCOMYCIN HCL IN DEXTROSE 1-5 GM/200ML-% IV SOLN
1000.0000 mg | INTRAVENOUS | Status: AC
  Administered 2021-07-27: 1000 mg via INTRAVENOUS
  Filled 2021-07-27: qty 200

## 2021-07-27 MED ORDER — DEXAMETHASONE SODIUM PHOSPHATE 10 MG/ML IJ SOLN
10.0000 mg | Freq: Once | INTRAMUSCULAR | Status: AC
  Administered 2021-07-28: 10 mg via INTRAVENOUS
  Filled 2021-07-27: qty 1

## 2021-07-27 MED ORDER — FENTANYL CITRATE PF 50 MCG/ML IJ SOSY
PREFILLED_SYRINGE | INTRAMUSCULAR | Status: AC
  Filled 2021-07-27: qty 1

## 2021-07-27 MED ORDER — ACETAMINOPHEN 325 MG PO TABS: 325.0000 mg | ORAL_TABLET | Freq: Four times a day (QID) | ORAL | Status: DC | PRN

## 2021-07-27 MED ORDER — TRANEXAMIC ACID-NACL 1000-0.7 MG/100ML-% IV SOLN
1000.0000 mg | Freq: Once | INTRAVENOUS | Status: AC
Start: 2021-07-27 — End: 2021-07-27
  Administered 2021-07-27: 1000 mg via INTRAVENOUS
  Filled 2021-07-27: qty 100

## 2021-07-27 MED ORDER — HYDROMORPHONE HCL 1 MG/ML IJ SOLN
0.5000 mg | INTRAMUSCULAR | Status: DC | PRN
  Administered 2021-07-27: 1 mg via INTRAVENOUS
  Filled 2021-07-27: qty 1

## 2021-07-27 MED ORDER — GENTAMICIN SULFATE 40 MG/ML IJ SOLN
5.0000 mg/kg | Freq: Once | INTRAVENOUS | Status: DC
  Filled 2021-07-27: qty 6.25

## 2021-07-27 MED ORDER — FENTANYL CITRATE PF 50 MCG/ML IJ SOSY
25.0000 ug | PREFILLED_SYRINGE | INTRAMUSCULAR | Status: DC | PRN
  Administered 2021-07-27: 50 ug via INTRAVENOUS

## 2021-07-27 MED ORDER — DOCUSATE SODIUM 100 MG PO CAPS
100.0000 mg | ORAL_CAPSULE | Freq: Two times a day (BID) | ORAL | Status: DC
  Administered 2021-07-27 – 2021-07-28 (×2): 100 mg via ORAL
  Filled 2021-07-27 (×2): qty 1

## 2021-07-27 MED ORDER — METHOCARBAMOL 500 MG PO TABS
ORAL_TABLET | ORAL | Status: AC
  Filled 2021-07-27: qty 1

## 2021-07-27 MED ORDER — METOCLOPRAMIDE HCL 5 MG/ML IJ SOLN: 5.0000 mg | Freq: Three times a day (TID) | INTRAMUSCULAR | Status: DC | PRN

## 2021-07-27 MED ORDER — CHLORHEXIDINE GLUCONATE 0.12 % MT SOLN
15.0000 mL | Freq: Once | OROMUCOSAL | Status: AC
  Administered 2021-07-27: 15 mL via OROMUCOSAL

## 2021-07-27 MED ORDER — FAMOTIDINE 20 MG PO TABS
10.0000 mg | ORAL_TABLET | Freq: Every day | ORAL | Status: DC
  Administered 2021-07-27: 10 mg via ORAL
  Filled 2021-07-27: qty 1

## 2021-07-27 MED ORDER — ROSUVASTATIN CALCIUM 20 MG PO TABS
40.0000 mg | ORAL_TABLET | Freq: Every day | ORAL | Status: DC
  Administered 2021-07-27: 40 mg via ORAL
  Filled 2021-07-27: qty 2

## 2021-07-27 MED ORDER — ONDANSETRON HCL 4 MG/2ML IJ SOLN: 4.0000 mg | Freq: Once | INTRAMUSCULAR | Status: DC | PRN

## 2021-07-27 MED ORDER — PHENYLEPHRINE HCL-NACL 20-0.9 MG/250ML-% IV SOLN
INTRAVENOUS | Status: DC | PRN
  Administered 2021-07-27: 75 ug/min via INTRAVENOUS

## 2021-07-27 MED ORDER — IRBESARTAN 150 MG PO TABS
300.0000 mg | ORAL_TABLET | Freq: Every day | ORAL | Status: DC
  Administered 2021-07-28: 300 mg via ORAL
  Filled 2021-07-27: qty 2

## 2021-07-27 MED ORDER — VANCOMYCIN HCL IN DEXTROSE 1-5 GM/200ML-% IV SOLN
1000.0000 mg | Freq: Two times a day (BID) | INTRAVENOUS | Status: AC
  Administered 2021-07-27: 1000 mg via INTRAVENOUS
  Filled 2021-07-27: qty 200

## 2021-07-27 MED ORDER — FLUOXETINE HCL 20 MG PO CAPS
20.0000 mg | ORAL_CAPSULE | Freq: Every day | ORAL | Status: DC
  Administered 2021-07-27: 20 mg via ORAL
  Filled 2021-07-27: qty 1

## 2021-07-27 MED ORDER — PHENOL 1.4 % MT LIQD
1.0000 | OROMUCOSAL | Status: DC | PRN
Start: 2021-07-27 — End: 2021-07-28

## 2021-07-27 MED ORDER — METHOCARBAMOL 500 MG PO TABS
500.0000 mg | ORAL_TABLET | Freq: Four times a day (QID) | ORAL | Status: DC | PRN
  Administered 2021-07-27 – 2021-07-28 (×3): 500 mg via ORAL
  Filled 2021-07-27 (×2): qty 1

## 2021-07-27 SURGICAL SUPPLY — 49 items
ADH SKN CLS APL DERMABOND .7 (GAUZE/BANDAGES/DRESSINGS) ×1
BAG COUNTER SPONGE SURGICOUNT (BAG) ×1 IMPLANT
BAG DECANTER FOR FLEXI CONT (MISCELLANEOUS) IMPLANT
BAG SPEC THK2 15X12 ZIP CLS (MISCELLANEOUS)
BAG SPNG CNTER NS LX DISP (BAG) ×1
BAG ZIPLOCK 12X15 (MISCELLANEOUS) IMPLANT
BLADE SAG 18X100X1.27 (BLADE) ×2 IMPLANT
COVER PERINEAL POST (MISCELLANEOUS) ×2 IMPLANT
COVER SURGICAL LIGHT HANDLE (MISCELLANEOUS) ×2 IMPLANT
CUP ACET PINNACLE SECTR 50MM (Hips) IMPLANT
DERMABOND ADVANCED (GAUZE/BANDAGES/DRESSINGS) ×1
DERMABOND ADVANCED .7 DNX12 (GAUZE/BANDAGES/DRESSINGS) ×1 IMPLANT
DRAPE FOOT SWITCH (DRAPES) ×2 IMPLANT
DRAPE STERI IOBAN 125X83 (DRAPES) ×2 IMPLANT
DRAPE U-SHAPE 47X51 STRL (DRAPES) ×4 IMPLANT
DRESSING AQUACEL AG SP 3.5X10 (GAUZE/BANDAGES/DRESSINGS) ×1 IMPLANT
DRSG AQUACEL AG ADV 3.5X10 (GAUZE/BANDAGES/DRESSINGS) ×1 IMPLANT
DRSG AQUACEL AG SP 3.5X10 (GAUZE/BANDAGES/DRESSINGS) ×2
DURAPREP 26ML APPLICATOR (WOUND CARE) ×2 IMPLANT
ELECT REM PT RETURN 15FT ADLT (MISCELLANEOUS) ×2 IMPLANT
ELIMINATOR HOLE APEX DEPUY (Hips) ×1 IMPLANT
GLOVE BIO SURGEON STRL SZ 6 (GLOVE) ×2 IMPLANT
GLOVE BIOGEL PI IND STRL 6.5 (GLOVE) ×1 IMPLANT
GLOVE BIOGEL PI IND STRL 7.5 (GLOVE) ×1 IMPLANT
GLOVE BIOGEL PI INDICATOR 6.5 (GLOVE) ×1
GLOVE BIOGEL PI INDICATOR 7.5 (GLOVE) ×1
GLOVE ORTHO TXT STRL SZ7.5 (GLOVE) ×4 IMPLANT
GOWN STRL REUS W/ TWL LRG LVL3 (GOWN DISPOSABLE) ×3 IMPLANT
GOWN STRL REUS W/TWL LRG LVL3 (GOWN DISPOSABLE) ×6
HEAD FEMORAL 32 CERAMIC (Hips) ×1 IMPLANT
HOLDER FOLEY CATH W/STRAP (MISCELLANEOUS) ×2 IMPLANT
KIT TURNOVER KIT A (KITS) ×1 IMPLANT
LINER ACET PNNCL PLUS4 NEUTRAL (Hips) IMPLANT
PACK ANTERIOR HIP CUSTOM (KITS) ×2 IMPLANT
PINNACLE PLUS 4 NEUTRAL (Hips) ×2 IMPLANT
PINNACLE SECTOR CUP 50MM (Hips) ×2 IMPLANT
SCREW 6.5MMX30MM (Screw) ×1 IMPLANT
SLEEVE SUCTION 125 (MISCELLANEOUS) ×1 IMPLANT
STEM FEM ACTIS HIGH SZ2 (Stem) ×1 IMPLANT
SUT MNCRL AB 4-0 PS2 18 (SUTURE) ×2 IMPLANT
SUT STRATAFIX 0 PDS 27 VIOLET (SUTURE) ×2
SUT VIC AB 1 CT1 36 (SUTURE) ×6 IMPLANT
SUT VIC AB 2-0 CT1 27 (SUTURE) ×4
SUT VIC AB 2-0 CT1 TAPERPNT 27 (SUTURE) ×2 IMPLANT
SUTURE STRATFX 0 PDS 27 VIOLET (SUTURE) ×1 IMPLANT
TRAY FOLEY MTR SLVR 14FR STAT (SET/KITS/TRAYS/PACK) ×1 IMPLANT
TRAY FOLEY MTR SLVR 16FR STAT (SET/KITS/TRAYS/PACK) IMPLANT
TUBE SUCTION HIGH CAP CLEAR NV (SUCTIONS) ×2 IMPLANT
WATER STERILE IRR 1000ML POUR (IV SOLUTION) ×2 IMPLANT

## 2021-07-27 NOTE — Evaluation (Signed)
Physical Therapy Evaluation Patient Details Name: Brandy [name] MRN: [id number] DOB: 03-05-1950 Today's Date: 07/27/2021  History of Present Illness  Pt is a 71yo female presenting s/p R-THA, AA on 07/27/21. PMH: Anxiety & depression, biploar, CAD fibromyalgia, GERD, HLD, HTN, PONV, L-THA 2018, multiple shoulder surgeries.  Clinical Impression  Brandy [name] is a 71 y.o. female POD 0 s/p R-THA, AA. Patient reports IND with mobility at baseline. Patient is now limited by functional impairments (see PT problem list below) and requires min assist for bed mobility and min guard for transfers. Patient was able to ambulate 20 feet with RW and min guard level of assist. Patient instructed in exercise to facilitate ROM and circulation to manage edema. Provided incentive spirometer and with Vcs pt able to achieve 1531m. Patient will benefit from continued skilled PT interventions to address impairments and progress towards PLOF. Acute PT will follow to progress mobility and stair training in preparation for safe discharge home.       Recommendations for follow up therapy are one component of a multi-disciplinary discharge planning process, led by the attending physician.  Recommendations may be updated based on patient status, additional functional criteria and insurance authorization.  Follow Up Recommendations Follow physician's recommendations for discharge plan and follow up therapies      Assistance Recommended at Discharge Set up Supervision/Assistance  Patient can return home with the following  A little help with walking and/or transfers;A little help with bathing/dressing/bathroom;Assistance with cooking/housework;Assist for transportation;Help with stairs or ramp for entrance    Equipment Recommendations Rolling walker (2 wheels)  Recommendations for Other Services       Functional Status Assessment Patient has had a recent decline in their functional status and demonstrates the  ability to make significant improvements in function in a reasonable and predictable amount of time.     Precautions / Restrictions Precautions Precautions: Fall Restrictions Weight Bearing Restrictions: No Other Position/Activity Restrictions: wbat      Mobility  Bed Mobility Overal bed mobility: Needs Assistance Bed Mobility: Supine to Sit     Supine to sit: Min assist, HOB elevated     General bed mobility comments: min assist to elevate trunk    Transfers Overall transfer level: Needs assistance Equipment used: Rolling walker (2 wheels) Transfers: Sit to/from Stand Sit to Stand: Min guard           General transfer comment: For safety only, VCs for sequencing    Ambulation/Gait Ambulation/Gait assistance: Min guard Gait Distance (Feet): 20 Feet Assistive device: Rolling walker (2 wheels) Gait Pattern/deviations: Step-to pattern Gait velocity: decreased     General Gait Details: Pt ambulated with RW and min guard, no physical assist req or overt LOB noted.  Stairs            Wheelchair Mobility    Modified Rankin (Stroke Patients Only)       Balance Overall balance assessment: Needs assistance Sitting-balance support: Feet supported, No upper extremity supported Sitting balance-Leahy Scale: Good     Standing balance support: Reliant on assistive device for balance, During functional activity, Bilateral upper extremity supported Standing balance-Leahy Scale: Poor                               Pertinent Vitals/Pain Pain Assessment Pain Assessment: 0-10 Pain Score: 5  Pain Location: R hip Pain Descriptors / Indicators: Operative site guarding Pain Intervention(s): Limited activity within patient's tolerance, Monitored during session,  Repositioned, Ice applied    Home Living Family/patient expects to be discharged to:: Private residence Living Arrangements: Alone Available Help at Discharge: Friend(s);Neighbor;Available  PRN/intermittently Type of Home: House Home Access: Stairs to enter Entrance Stairs-Rails: None Entrance Stairs-Number of Steps: 2   Home Layout: One level Home Equipment: Rollator (4 wheels);Adaptive equipment      Prior Function Prior Level of Function : Independent/Modified Independent             Mobility Comments: IND ADLs Comments: IND     Hand Dominance        Extremity/Trunk Assessment   Upper Extremity Assessment Upper Extremity Assessment: Overall WFL for tasks assessed    Lower Extremity Assessment Lower Extremity Assessment: RLE deficits/detail;LLE deficits/detail RLE Deficits / Details: MMT ank DF/PF 5/5 RLE Sensation: WNL LLE Deficits / Details: MMT ank DF/PF 5/5 LLE Sensation: WNL    Cervical / Trunk Assessment Cervical / Trunk Assessment: Kyphotic  Communication   Communication: No difficulties  Cognition Arousal/Alertness: Awake/alert Behavior During Therapy: WFL for tasks assessed/performed Overall Cognitive Status: Within Functional Limits for tasks assessed                                          General Comments      Exercises Total Joint Exercises Ankle Circles/Pumps: AROM, Both, 10 reps   Assessment/Plan    PT Assessment Patient needs continued PT services  PT Problem List Decreased strength;Decreased range of motion;Decreased activity tolerance;Decreased balance;Decreased mobility;Decreased coordination;Pain       PT Treatment Interventions DME instruction;Gait training;Stair training;Functional mobility training;Therapeutic activities;Therapeutic exercise;Balance training;Neuromuscular re-education;Patient/family education    PT Goals (Current goals can be found in the Care Plan section)  Acute Rehab PT Goals Patient Stated Goal: Walk without pain PT Goal Formulation: With patient Time For Goal Achievement: 08/03/21 Potential to Achieve Goals: Good    Frequency 7X/week     Co-evaluation                AM-PAC PT "6 Clicks" Mobility  Outcome Measure Help needed turning from your back to your side while in a flat bed without using bedrails?: None Help needed moving from lying on your back to sitting on the side of a flat bed without using bedrails?: A Little Help needed moving to and from a bed to a chair (including a wheelchair)?: A Little Help needed standing up from a chair using your arms (e.g., wheelchair or bedside chair)?: A Little Help needed to walk in hospital room?: A Little Help needed climbing 3-5 steps with a railing? : A Lot 6 Click Score: 18    End of Session Equipment Utilized During Treatment: Gait belt Activity Tolerance: Patient tolerated treatment well;No increased pain Patient left: in chair;with call bell/phone within reach;with chair alarm set;with SCD's reapplied Nurse Communication: Mobility status PT Visit Diagnosis: Pain;Difficulty in walking, not elsewhere classified (R26.2) Pain - Right/Left: Right Pain - part of body: Hip    Time: 7989-2119 PT Time Calculation (min) (ACUTE ONLY): 16 min   Charges:   PT Evaluation $PT Eval Low Complexity: [place], PT, DPT [place] [facility] Office: [phone] Pager: [phone]  Coolidge Breeze 07/27/2021, 5:54 PM

## 2021-07-27 NOTE — Care Plan (Signed)
Ortho Bundle Case Management Note  Patient Details  Name: Brandy [name] MRN: [id number] Date of Birth: Dec 10, 1950  R THA on 07-27-21 DCP:  Home with friends DME:  Borrowing a RW PT:  HEP                   DME Arranged:  N/A DME Agency:  NA  HH Arranged:  NA [place] Agency:  NA  Additional Comments: Please contact me with any questions of if this plan should need to change.  Marianne Sofia, RN,CCM [facility]  [phone] 07/27/2021, 9:13 AM

## 2021-07-27 NOTE — Anesthesia Postprocedure Evaluation (Signed)
Anesthesia Post Note  Patient: Brandy [name]  Procedure(s) Performed: TOTAL HIP ARTHROPLASTY ANTERIOR APPROACH (Right: Hip)     Patient location during evaluation: PACU Anesthesia Type: Spinal Level of consciousness: awake and alert Pain management: pain level controlled Vital Signs Assessment: post-procedure vital signs reviewed and stable Respiratory status: spontaneous breathing and respiratory function stable Cardiovascular status: blood pressure returned to baseline and stable Postop Assessment: spinal receding and no apparent nausea or vomiting Anesthetic complications: no   No notable events documented.  Last Vitals:  Vitals:   07/27/21 1500 07/27/21 1521  BP:  (!) 115/59  Pulse:  (!) 58  Resp:  20  Temp:  36.7 C  SpO2: 100% 100%    Last Pain:  Vitals:   07/27/21 1521  TempSrc: Oral  PainSc:                  Audry Pili

## 2021-07-27 NOTE — Plan of Care (Signed)
?  Problem: Education: ?Goal: Knowledge of General Education information will improve ?Description: Including pain rating scale, medication(s)/side effects and non-pharmacologic comfort measures ?Outcome: Progressing ?  ?Problem: Activity: ?Goal: Risk for activity intolerance will decrease ?Outcome: Progressing ?  ?Problem: Nutrition: ?Goal: Adequate nutrition will be maintained ?Outcome: Progressing ?  ?Problem: Elimination: ?Goal: Will not experience complications related to bowel motility ?Outcome: Progressing ?  ?Problem: Pain Managment: ?Goal: General experience of comfort will improve ?Outcome: Progressing ?  ?Problem: Education: ?Goal: Knowledge of the prescribed therapeutic regimen will improve ?Outcome: Progressing ?  ?Problem: Activity: ?Goal: Ability to avoid complications of mobility impairment will improve ?Outcome: Progressing ?  ?Problem: Pain Management: ?Goal: Pain level will decrease with appropriate interventions ?Outcome: Progressing ?  ?

## 2021-07-27 NOTE — Discharge Instructions (Signed)

## 2021-07-27 NOTE — Anesthesia Procedure Notes (Signed)
Spinal  Patient location during procedure: OR Start time: 07/27/2021 11:30 AM End time: 07/27/2021 11:33 AM Reason for block: surgical anesthesia Staffing Performed: resident/CRNA  Resident/CRNA: Cleda Daub, CRNA Performed by: Cleda Daub, CRNA Authorized by: Audry Pili, MD   Preanesthetic Checklist Completed: patient identified, IV checked, site marked, risks and benefits discussed, surgical consent, monitors and equipment checked, pre-op evaluation and timeout performed Spinal Block Patient position: sitting Prep: DuraPrep Patient monitoring: heart rate, cardiac monitor, continuous pulse ox and blood pressure Approach: midline Location: L3-4 Injection technique: single-shot Needle Needle type: Pencan  Needle gauge: 24 G Needle length: 10 cm Assessment Sensory level: T4 Events: CSF return Additional Notes Checked expiration dates on spinal kit and dura prep; maintained aseptic technique; anesthetized skin with lidocaine; saw swirl of clear CSF prior to bupivacaine injection . Pt tolerated well.

## 2021-07-27 NOTE — Transfer of Care (Signed)
Immediate Anesthesia Transfer of Care Note  Patient: Brandy [name]  Procedure(s) Performed: TOTAL HIP ARTHROPLASTY ANTERIOR APPROACH (Right: Hip)  Patient Location: PACU  Anesthesia Type:Spinal and MAC combined with regional for post-op pain  Level of Consciousness: awake, alert , oriented and patient cooperative  Airway & Oxygen Therapy: Patient Spontanous Breathing  Post-op Assessment: Report given to RN and Post -op Vital signs reviewed and stable  Post vital signs: Reviewed and stable  Last Vitals:  Vitals Value Taken Time  BP 97/54 07/27/21 1305  Temp    Pulse 66 07/27/21 1311  Resp 16 07/27/21 1311  SpO2 100 % 07/27/21 1311  Vitals shown include unvalidated device data.  Last Pain:  Vitals:   07/27/21 0938  TempSrc:   PainSc: 0-No pain      Patients Stated Pain Goal: 4 (03/49/17 9150)  Complications: No notable events documented.

## 2021-07-27 NOTE — Interval H&P Note (Signed)
History and Physical Interval Note:  07/27/2021 10:08 AM  Brandy [name]  has presented today for surgery, with the diagnosis of Right hip osteoarthritis.  The various methods of treatment have been discussed with the patient and family. After consideration of risks, benefits and other options for treatment, the patient has consented to  Procedure(s): TOTAL HIP ARTHROPLASTY ANTERIOR APPROACH (Right) as a surgical intervention.  The patient's history has been reviewed, patient examined, no change in status, stable for surgery.  I have reviewed the patient's chart and labs.  Questions were answered to the patient's satisfaction.     Mauri Pole

## 2021-07-27 NOTE — Op Note (Signed)
NAME:  Brandy [name]                ACCOUNT NO.: [id number]      MEDICAL RECORD NO.: [id number]      FACILITY:  [facility]      PHYSICIAN:  Mauri Pole  DATE OF BIRTH:  March 13, 1950     DATE OF PROCEDURE:  07/27/2021                                 OPERATIVE REPORT         PREOPERATIVE DIAGNOSIS: Right  hip osteoarthritis.      POSTOPERATIVE DIAGNOSIS:  Right hip osteoarthritis.      PROCEDURE:  Right total hip replacement through an anterior approach   utilizing DePuy THR system, component size 50 mm pinnacle cup, a size 32+4 neutral   Altrex liner, a size 2 Hi Actis stem with a 32+1 delta ceramic   ball.      SURGEON:  Pietro Cassis. Alvan Dame, M.D.      ASSISTANT:  Costella Hatcher, PA-C     ANESTHESIA:  Spinal.      SPECIMENS:  None.      COMPLICATIONS:  None.      BLOOD LOSS:  150 cc     DRAINS:  None.      INDICATION OF THE PROCEDURE:  Brandy [name] is a 71 y.o. female who had   presented to office for evaluation of right hip pain.  Radiographs revealed   progressive degenerative changes with bone-on-bone   articulation of the  hip joint, including subchondral cystic changes and osteophytes.  The patient had painful limited range of   motion significantly affecting their overall quality of life and function.  The patient was failing to    respond to conservative measures including medications and/or injections and activity modification and at this point was ready   to proceed with more definitive measures.  Consent was obtained for   benefit of pain relief.  Specific risks of infection, DVT, component   failure, dislocation, neurovascular injury, and need for revision surgery were reviewed in the office.     PROCEDURE IN DETAIL:  The patient was brought to operative theater.   Once adequate anesthesia, preoperative antibiotics, 1 gm Vancomycin and single weight based dose of Gentamycin, 1 gm of Tranexamic Acid, and 10 mg of Decadron were  administered, the patient was positioned supine on the [employer] table.  Once the patient was safely positioned with adequate padding of boney prominences we predraped out the hip, and used fluoroscopy to confirm orientation of the pelvis.      The right hip was then prepped and draped from proximal iliac crest to   mid thigh with a shower curtain technique.      Time-out was performed identifying the patient, planned procedure, and the appropriate extremity.     An incision was then made 2 cm lateral to the   anterior superior iliac spine extending over the orientation of the   tensor fascia lata muscle and sharp dissection was carried down to the   fascia of the muscle.      The fascia was then incised.  The muscle belly was identified and swept   laterally and retractor placed along the superior neck.  Following   cauterization of the circumflex vessels and removing some pericapsular   fat, a second cobra  retractor was placed on the inferior neck.  A T-capsulotomy was made along the line of the   superior neck to the trochanteric fossa, then extended proximally and   distally.  Tag sutures were placed and the retractors were then placed   intracapsular.  We then identified the trochanteric fossa and   orientation of my neck cut and then made a neck osteotomy with the femur on traction.  The femoral   head was removed without difficulty or complication.  Traction was let   off and retractors were placed posterior and anterior around the   acetabulum.      The labrum and foveal tissue were debrided.  I began reaming with a 43 mm   reamer and reamed up to 49 mm reamer with good bony bed preparation and a 50 mm  cup was chosen.  The final 50 mm Pinnacle cup was then impacted under fluoroscopy to confirm the depth of penetration and orientation with respect to   Abduction and forward flexion.  A screw was placed into the ilium followed by the hole eliminator.  The final   32+4 neutral  Altrex liner was impacted with good visualized rim fit.  The cup was positioned anatomically within the acetabular portion of the pelvis.      At this point, the femur was rolled to 100 degrees.  Further capsule was   released off the inferior aspect of the femoral neck.  I then   released the superior capsule proximally.  With the leg in a neutral position the hook was placed laterally   along the femur under the vastus lateralis origin and elevated manually and then held in position using the hook attachment on the bed.  The leg was then extended and adducted with the leg rolled to 100   degrees of external rotation.  Retractors were placed along the medial calcar and posteriorly over the greater trochanter.  Once the proximal femur was fully   exposed, I used a box osteotome to set orientation.  I then began   broaching with the starting chili pepper broach and passed this by hand and then broached up to 2.  With the 2 broach in place I chose a high offset neck and did several trial reductions.  The offset was appropriate, leg lengths   appeared to be equal best matched with the +1 head ball trial confirmed radiographically.   Given these findings, I went ahead and dislocated the hip, repositioned all   retractors and positioned the right hip in the extended and abducted position.  The final 2 Hi Actis stem was   chosen and it was impacted down to the level of neck cut.  Based on this   and the trial reductions, a final 32+1 delta ceramic ball was chosen and   impacted onto a clean and dry trunnion, and the hip was reduced.  The   hip had been irrigated throughout the case again at this point.  I did   reapproximate the superior capsular leaflet to the anterior leaflet   using #1 Vicryl.  The fascia of the   tensor fascia lata muscle was then reapproximated using #1 Vicryl and #0 Stratafix sutures.  The   remaining wound was closed with 2-0 Vicryl and running 4-0 Monocryl.   The hip was  cleaned, dried, and dressed sterilely using Dermabond and   Aquacel dressing.  The patient was then brought   to recovery room in stable condition tolerating the  procedure well.    Costella Hatcher, PA-C was present for the entirety of the case involved from   preoperative positioning, perioperative retractor management, general   facilitation of the case, as well as primary wound closure as assistant.            Pietro Cassis Alvan Dame, M.D.        07/27/2021 12:47 PM

## 2021-07-28 ENCOUNTER — Encounter (HOSPITAL_COMMUNITY): Payer: Self-pay | Admitting: Orthopedic Surgery

## 2021-07-28 DIAGNOSIS — Z96642 Presence of left artificial hip joint: Secondary | ICD-10-CM | POA: Diagnosis not present

## 2021-07-28 DIAGNOSIS — I2510 Atherosclerotic heart disease of native coronary artery without angina pectoris: Secondary | ICD-10-CM | POA: Diagnosis not present

## 2021-07-28 DIAGNOSIS — I10 Essential (primary) hypertension: Secondary | ICD-10-CM | POA: Diagnosis not present

## 2021-07-28 DIAGNOSIS — M1611 Unilateral primary osteoarthritis, right hip: Secondary | ICD-10-CM | POA: Diagnosis not present

## 2021-07-28 DIAGNOSIS — Z8616 Personal history of COVID-19: Secondary | ICD-10-CM | POA: Diagnosis not present

## 2021-07-28 DIAGNOSIS — Z951 Presence of aortocoronary bypass graft: Secondary | ICD-10-CM | POA: Diagnosis not present

## 2021-07-28 LAB — BASIC METABOLIC PANEL
Anion gap: 9 (ref 5–15)
BUN: 22 mg/dL (ref 8–23)
CO2: 25 mmol/L (ref 22–32)
Calcium: 9 mg/dL (ref 8.9–10.3)
Chloride: 106 mmol/L (ref 98–111)
Creatinine, Ser: 1.01 mg/dL — ABNORMAL HIGH (ref 0.44–1.00)
GFR, Estimated: 59 mL/min — ABNORMAL LOW (ref >60)
Glucose, Bld: 140 mg/dL — ABNORMAL HIGH (ref 70–99)
Potassium: 4.5 mmol/L (ref 3.5–5.1)
Sodium: 140 mmol/L (ref 135–145)

## 2021-07-28 LAB — CBC
HCT: 31.2 % — ABNORMAL LOW (ref 36.0–46.0)
Hemoglobin: 10.2 g/dL — ABNORMAL LOW (ref 12.0–15.0)
MCH: 30.2 pg (ref 26.0–34.0)
MCHC: 32.7 g/dL (ref 30.0–36.0)
MCV: 92.3 fL (ref 80.0–100.0)
Platelets: 191 10*3/uL (ref 150–400)
RBC: 3.38 MIL/uL — ABNORMAL LOW (ref 3.87–5.11)
RDW: 12.3 % (ref 11.5–15.5)
WBC: 10 10*3/uL (ref 4.0–10.5)
nRBC: 0 % (ref 0.0–0.2)

## 2021-07-28 MED ORDER — CELECOXIB 200 MG PO CAPS
200.0000 mg | ORAL_CAPSULE | Freq: Every day | ORAL | Status: DC
  Administered 2021-07-28: 200 mg via ORAL
  Filled 2021-07-28: qty 1

## 2021-07-28 MED ORDER — HYDROMORPHONE HCL 2 MG PO TABS: 1.0000 mg | ORAL_TABLET | ORAL | 0 refills | Status: DC | PRN

## 2021-07-28 MED ORDER — ASPIRIN 81 MG PO CHEW: 81.0000 mg | CHEWABLE_TABLET | Freq: Two times a day (BID) | ORAL | 0 refills | Status: AC

## 2021-07-28 MED ORDER — POLYETHYLENE GLYCOL 3350 17 G PO PACK: 17.0000 g | PACK | Freq: Every day | ORAL | 0 refills | Status: DC | PRN

## 2021-07-28 MED ORDER — DOCUSATE SODIUM 100 MG PO CAPS: 100.0000 mg | ORAL_CAPSULE | Freq: Two times a day (BID) | ORAL | 0 refills | Status: DC

## 2021-07-28 MED ORDER — METHOCARBAMOL 500 MG PO TABS: 500.0000 mg | ORAL_TABLET | Freq: Four times a day (QID) | ORAL | 0 refills | Status: DC | PRN

## 2021-07-28 NOTE — Progress Notes (Signed)
   Subjective: 1 Day Post-Op Procedure(s) (LRB): TOTAL HIP ARTHROPLASTY ANTERIOR APPROACH (Right) Patient reports pain as mild.   Patient seen in rounds with Dr. Alvan Dame. Patient is resting in bed on exam this morning. No acute events overnight. Foley catheter removed. Ambulated 20 feet with PT.  We will continue therapy today.   Objective: Vital signs in last 24 hours: Temp:  [96.4 F (35.8 C)-98 F (36.7 C)] 97.6 F (36.4 C) (07/19 0537) Pulse Rate:  [54-83] 56 (07/19 0537) Resp:  [13-21] 17 (07/19 0537) BP: (90-132)/(50-67) 104/50 (07/19 0537) SpO2:  [95 %-100 %] 97 % (07/19 0537) Weight:  [50.3 kg] 50.3 kg (07/18 0938)  Intake/Output from previous day:  Intake/Output Summary (Last 24 hours) at 07/28/2021 0800 Last data filed at 07/28/2021 0600 Gross per 24 hour  Intake 4679.86 ml  Output 2020 ml  Net 2659.86 ml     Intake/Output this shift: No intake/output data recorded.  Labs: Recent Labs    07/28/21 0313  HGB 10.2*   Recent Labs    07/28/21 0313  WBC 10.0  RBC 3.38*  HCT 31.2*  PLT 191   Recent Labs    07/28/21 0313  NA 140  K 4.5  CL 106  CO2 25  BUN 22  CREATININE 1.01*  GLUCOSE 140*  CALCIUM 9.0   No results for input(s): "LABPT", "INR" in the last 72 hours.  Exam: General - Patient is Alert and Oriented Extremity - Neurologically intact Sensation intact distally Intact pulses distally Dorsiflexion/Plantar flexion intact Dressing - dressing C/D/I Motor Function - intact, moving foot and toes well on exam.   Past Medical History:  Diagnosis Date   Anxiety    Arthritis    Bipolar 1 disorder (HCC)    CAD (coronary artery disease) 2007   CABG X 1   COVID 03/04/2021   Depression    controlled   Fibromyalgia    GERD (gastroesophageal reflux disease)    Hay fever    Headache    Hx of migraines   Heart murmur    Hyperlipidemia    Hypertension    Idiopathic hypersomnia    Mitral valve prolapse    Narcolepsy    PONV (postoperative  nausea and vomiting)    Urinary incontinence     Assessment/Plan: 1 Day Post-Op Procedure(s) (LRB): TOTAL HIP ARTHROPLASTY ANTERIOR APPROACH (Right) Principal Problem:   S/P total right hip arthroplasty  Estimated body mass index is 21.68 kg/m as calculated from the following:   Height as of this encounter: 5' (1.524 m).   Weight as of this encounter: 50.3 kg. Advance diet Up with therapy D/C IV fluids  DVT Prophylaxis - Aspirin Weight bearing as tolerated.  Hgb stable at 10.2 this AM. Cr. Slightly elevated to 1.01 - will resume back to daily celebrex rather than BID.   Plan is to go Home after hospital stay. Plan for discharge today after meeting goals with therapy. Follow up in the office in 2 weeks.   Griffith Citron, PA-C Orthopedic Surgery [phone] 07/28/2021, 8:00 AM

## 2021-07-28 NOTE — TOC Transition Note (Signed)
Transition of Care [facility]) - CM/SW Discharge Note   Patient Details  Name: Brandy [name] MRN: [id number] Date of Birth: 05-01-1950  [facility] CM/SW Contact:  Lennart Pall, LCSW Phone Number: 07/28/2021, 12:02 PM   Clinical Narrative:    Met with pt and confirming she has needed DME for home.  Plan for HEP.  No TOC needs.   Final next level of care: Home/Self Care Barriers to Discharge: No Barriers Identified   Patient Goals and CMS Choice Patient states their goals for this hospitalization and ongoing recovery are:: return home      Discharge Placement                       Discharge Plan and Services                DME Arranged: N/A DME Agency: NA       HH Arranged: NA HH Agency: NA        Social Determinants of Health (SDOH) Interventions     Readmission Risk Interventions     No data to display

## 2021-07-28 NOTE — Progress Notes (Signed)
Physical Therapy Treatment Patient Details Name: Brandy [name] MRN: [id number] DOB: 1950/09/06 Today's Date: 07/28/2021   History of Present Illness Pt is a 71yo female presenting s/p R-THA, AA on 07/27/21. PMH: Anxiety & depression, biploar, CAD fibromyalgia, GERD, HLD, HTN, PONV, L-THA 2018, multiple shoulder surgeries.    PT Comments    Progressing well. Pt insists upon using her rollator-she declines [name] RW use. Reviewed/practiced exercises, gait training,and stair training. Issued HEP for pt to perform 2x/day. Instructed pt to try to ambulate often using her walker. All education completed.     Recommendations for follow up therapy are one component of a multi-disciplinary discharge planning process, led by the attending physician.  Recommendations may be updated based on patient status, additional functional criteria and insurance authorization.  Follow Up Recommendations  Follow physician's recommendations for discharge plan and follow up therapies     Assistance Recommended at Discharge PRN  Patient can return home with the following A little help with walking and/or transfers;A little help with bathing/dressing/bathroom;Assistance with cooking/housework;Assist for transportation;Help with stairs or ramp for entrance   Equipment Recommendations  Rolling walker (2 wheels)    Recommendations for Other Services       Precautions / Restrictions Precautions Precautions: Fall Restrictions Weight Bearing Restrictions: No Other Position/Activity Restrictions: wbat     Mobility  Bed Mobility Overal bed mobility: Needs Assistance Bed Mobility: Supine to Sit, Sit to Supine     Supine to sit: Supervision, HOB elevated Sit to supine: Supervision, HOB elevated   General bed mobility comments: Supv for safety.    Transfers Overall transfer level: Needs assistance Equipment used: Rolling walker (2 wheels) Transfers: Sit to/from Stand Sit to Stand: Supervision            General transfer comment: Supv for safety.    Ambulation/Gait Ambulation/Gait assistance: Min guard Gait Distance (Feet): 200 Feet Assistive device: Rollator (4 wheels) Gait Pattern/deviations: Step-through pattern, Decreased stride length       General Gait Details: Pt insisted upon using her rollator-she declines [name] RW. Cues for safety.   Stairs Stairs: Yes Stairs assistance: Min assist Stair Management: Step to pattern, Forwards Number of Stairs: 2 General stair comments: Pt was able to go up,down 2 steps with some assistance. She doesn't have a railing but she reports there is a wall nearby. Pt stated she is familiar and that she will be able to get her rollator up/down the stairs as necessary- "I did it before with no problem."   Wheelchair Mobility    Modified Rankin (Stroke Patients Only)       Balance Overall balance assessment: Mild deficits observed, not formally tested   Sitting balance-Leahy Scale: Good     Standing balance support: Reliant on assistive device for balance Standing balance-Leahy Scale: Fair                              Cognition Arousal/Alertness: Awake/alert Behavior During Therapy: WFL for tasks assessed/performed Overall Cognitive Status: Within Functional Limits for tasks assessed                                          Exercises Total Joint Exercises Ankle Circles/Pumps: AROM, Both, 10 reps Quad Sets: AROM, Both, 10 reps Heel Slides: AROM, Right, 10 reps Hip ABduction/ADduction: AROM, Right, 10 reps Long Arc Quad: AROM,  Right, 10 reps Knee Flexion: AROM, Right, 10 reps, Standing Marching in Standing: AROM, Both, 10 reps, Standing    General Comments        Pertinent Vitals/Pain Pain Assessment Pain Assessment: 0-10 Pain Score: 5  Pain Location: R hip Pain Descriptors / Indicators: Operative site guarding Pain Intervention(s): Limited activity within patient's tolerance,  Monitored during session, Ice applied    Home Living                          Prior Function            PT Goals (current goals can now be found in the care plan section) Progress towards PT goals: Progressing toward goals    Frequency    7X/week      PT Plan Current plan remains appropriate    Co-evaluation              AM-PAC PT "6 Clicks" Mobility   Outcome Measure  Help needed turning from your back to your side while in a flat bed without using bedrails?: None Help needed moving from lying on your back to sitting on the side of a flat bed without using bedrails?: None Help needed moving to and from a bed to a chair (including a wheelchair)?: A Little Help needed standing up from a chair using your arms (e.g., wheelchair or bedside chair)?: A Little Help needed to walk in hospital room?: A Little Help needed climbing 3-5 steps with a railing? : A Little 6 Click Score: 20    End of Session Equipment Utilized During Treatment: Gait belt Activity Tolerance: Patient tolerated treatment well Patient left: in bed;with call bell/phone within reach   PT Visit Diagnosis: Pain;Difficulty in walking, not elsewhere classified (R26.2) Pain - Right/Left: Right Pain - part of body: Hip     Time: 0092-3300 PT Time Calculation (min) (ACUTE ONLY): 21 min  Charges:  $Gait Training: 8-22 mins                        Doreatha Massed, PT Acute Rehabilitation  Office: [phone] Pager: [phone]

## 2021-07-30 ENCOUNTER — Encounter: Payer: Self-pay | Admitting: Pulmonary Disease

## 2021-07-30 DIAGNOSIS — G47419 Narcolepsy without cataplexy: Secondary | ICD-10-CM

## 2021-07-30 NOTE — Telephone Encounter (Signed)
Received the following message from patient:   "Please send an escript to [facility]; [place] for the following: Dextroamphetamine 5 mg tablet/ 3 tablets morning; 1 tablet at lunchtime--30 day supply.   This medication was previously dispensed on June 28, 2021   Thanks & kind regards,  Brandy [name] DOB- 04-06-1950"  I checked [facility] Nixon and Dr. Halford Chessman is not available again until 08/09/2021. Sarah, do you mind refilling her medication on Dr. Juanetta Gosling behalf? Thanks!

## 2021-08-03 MED ORDER — DEXTROAMPHETAMINE SULFATE 5 MG PO TABS: 20.0000 mg | ORAL_TABLET | Freq: Every morning | ORAL | 0 refills | Status: DC

## 2021-08-11 NOTE — Discharge Summary (Signed)
Patient ID: Brandy [name] MRN: [id number] DOB/AGE: 73-Nov-1952 73 y.o.  Admit date: 07/27/2021 Discharge date: 07/28/2021  Admission Diagnoses:  Right hip osteoarthritis  Discharge Diagnoses:  Principal Problem:   S/P total right hip arthroplasty   Past Medical History:  Diagnosis Date   Anxiety    Arthritis    Bipolar 1 disorder ([place])    CAD (coronary artery disease) 2007   CABG X 1   COVID 03/04/2021   Depression    controlled   Fibromyalgia    GERD (gastroesophageal reflux disease)    Hay fever    Headache    Hx of migraines   Heart murmur    Hyperlipidemia    Hypertension    Idiopathic hypersomnia    Mitral valve prolapse    Narcolepsy    PONV (postoperative nausea and vomiting)    Urinary incontinence     Surgeries: Procedure(s): TOTAL HIP ARTHROPLASTY ANTERIOR APPROACH on 07/27/2021   Consultants:   Discharged Condition: Improved  Hospital Course: Brandy [name] is an 71 y.o. female who was admitted 07/27/2021 for operative treatment ofS/P total right hip arthroplasty. Patient has severe unremitting pain that affects sleep, daily activities, and work/hobbies. After pre-op clearance the patient was taken to the operating room on 07/27/2021 and underwent  Procedure(s): TOTAL HIP ARTHROPLASTY ANTERIOR APPROACH.    Patient was given perioperative antibiotics:  Anti-infectives (From admission, onward)    Start     Dose/Rate Route Frequency Ordered Stop   07/27/21 2200  vancomycin (VANCOCIN) IVPB 1000 mg/200 mL premix        1,000 mg 200 mL/hr over 60 Minutes Intravenous Every 12 hours 07/27/21 1303 07/27/21 2255   07/27/21 1215  gentamicin (GARAMYCIN) 250 mg in dextrose 5 % 100 mL IVPB  Status:  Discontinued        5 mg/kg  50.3 kg 106.3 mL/hr over 60 Minutes Intravenous  Once 07/27/21 1214 07/28/21 1827   07/27/21 0915  vancomycin (VANCOCIN) IVPB 1000 mg/200 mL premix        1,000 mg 200 mL/hr over 60 Minutes Intravenous On call to O.R. 07/27/21 0911  07/27/21 1133        Patient was given sequential compression devices, early ambulation, and chemoprophylaxis to prevent DVT. Patient worked with PT and was meeting their goals regarding safe ambulation and transfers.  Patient benefited maximally from hospital stay and there were no complications.    Recent vital signs: No data found.   Recent laboratory studies: No results for input(s): "WBC", "HGB", "HCT", "PLT", "NA", "K", "CL", "CO2", "BUN", "CREATININE", "GLUCOSE", "INR", "CALCIUM" in the last 72 hours.  Invalid input(s): "PT", "2"   Discharge Medications:   Allergies as of 07/28/2021       Reactions   Penicillins Anaphylaxis   Has patient had a PCN reaction causing immediate rash, facial/tongue/throat swelling, SOB or lightheadedness with hypotension: Yes Has patient had a PCN reaction causing severe rash involving mucus membranes or skin necrosis: No Has patient had a PCN reaction that required hospitalization Yes Has patient had a PCN reaction occurring within the last 10 years: No If all of the above answers are "NO", then may proceed with Cephalosporin use.   Talwin [pentazocine] Other (See Comments)   Uncontrolled shaking/tremors   Bextra [valdecoxib] Hives   Codeine Nausea And Vomiting   Lactose Intolerance (gi) Diarrhea   Meperidine Hcl Nausea And Vomiting   Morphine Nausea And Vomiting        Medication List  STOP taking these medications    traMADol 50 MG tablet Commonly known as: ULTRAM       TAKE these medications    acetaminophen 650 MG CR tablet Commonly known as: TYLENOL Take 1,300 mg by mouth in the morning and at bedtime.   aspirin 81 MG chewable tablet Chew 1 tablet (81 mg total) by mouth 2 (two) times daily for 28 days.   celecoxib 200 MG capsule Commonly known as: CELEBREX Take 200 mg by mouth at bedtime.   chlorthalidone 25 MG tablet Commonly known as: HYGROTON Take 0.5 tablets (12.5 mg total) by mouth daily.    COLLAGEN-VITAMIN C-BIOTIN PO Take 3 capsules by mouth daily with supper.   docusate sodium 100 MG capsule Commonly known as: COLACE Take 1 capsule (100 mg total) by mouth 2 (two) times daily.   famotidine 10 MG tablet Commonly known as: PEPCID Take 10 mg by mouth at bedtime.   FLUoxetine 20 MG capsule Commonly known as: PROZAC Take 1 capsule (20 mg total) by mouth daily. What changed: when to take this   HYDROmorphone 2 MG tablet Commonly known as: DILAUDID Take 0.5-1 tablets (1-2 mg total) by mouth every 4 (four) hours as needed for severe pain.   methocarbamol 500 MG tablet Commonly known as: ROBAXIN Take 1 tablet (500 mg total) by mouth every 6 (six) hours as needed for muscle spasms.   multivitamin with minerals Tabs tablet Take 1 tablet by mouth daily with supper. Nature's [address] Alive! Once Daily Women's 50+   polyethylene glycol 17 g packet Commonly known as: MIRALAX / GLYCOLAX Take 17 g by mouth daily as needed for mild constipation.   psyllium 28 % packet Commonly known as: METAMUCIL SMOOTH TEXTURE Take 1 packet by mouth in the morning.   rosuvastatin 40 MG tablet Commonly known as: CRESTOR Take 1 tablet (40 mg total) by mouth daily. What changed: when to take this   valsartan 320 MG tablet Commonly known as: DIOVAN Take 1 tablet (320 mg total) by mouth daily. What changed: when to take this   VITAMIN D3 PO Take 2,000 Units by mouth daily with supper.               Discharge Care Instructions  (From admission, onward)           Start     Ordered   07/28/21 0000  Change dressing       Comments: Maintain surgical dressing until follow up in the clinic. If the edges start to pull up, may reinforce with tape. If the dressing is no longer working, may remove and cover with gauze and tape, but must keep the area dry and clean.  Call with any questions or concerns.   07/28/21 0806            Diagnostic Studies: DG Pelvis Portable  Result  Date: 07/27/2021 CLINICAL DATA:  Post right hip replacement EXAM: PORTABLE PELVIS 1 VIEWS COMPARISON:  None Available. FINDINGS: Interval postsurgical changes from right total hip arthroplasty. Arthroplasty components appear in their expected alignment. No periprosthetic fracture is identified. Expected postoperative changes within the overlying soft tissues including soft tissue gas. IMPRESSION: Postsurgical changes from total right hip arthroplasty. Electronically Signed   By: Yetta Glassman M.D.   On: 07/27/2021 13:53   DG HIP UNILAT WITH PELVIS 1V RIGHT  Result Date: 07/27/2021 CLINICAL DATA:  Right total hip replacement EXAM: DG HIP (WITH OR WITHOUT PELVIS) 1V RIGHT COMPARISON:  Right hip radiograph dated March 09, 2016. FINDINGS: Fluoroscopic images were obtained intraoperatively and submitted for post operative interpretation. Right total hip arthroplasty with hardware in expected position, 2 images were obtained with 9 seconds of fluoroscopy time and 0.5 mGy. Prior left total hip replacement. Please see the performing provider's procedural report for further detail. IMPRESSION: Fluoroscopic images of right total hip replacement. Electronically Signed   By: Yetta Glassman M.D.   On: 07/27/2021 12:57   DG C-Arm 1-60 Min-No Report  Result Date: 07/27/2021 Fluoroscopy was utilized by the requesting physician.  No radiographic interpretation.    Disposition: Discharge disposition: 01-Home or Self Care       Discharge Instructions     Call MD / Call 911   Complete by: As directed    If you experience chest pain or shortness of breath, CALL 911 and be transported to the hospital emergency room.  If you develope a fever above 101 F, pus (white drainage) or increased drainage or redness at the wound, or calf pain, call your surgeon's office.   Change dressing   Complete by: As directed    Maintain surgical dressing until follow up in the clinic. If the edges start to pull up, may  reinforce with tape. If the dressing is no longer working, may remove and cover with gauze and tape, but must keep the area dry and clean.  Call with any questions or concerns.   Constipation Prevention   Complete by: As directed    Drink plenty of fluids.  Prune juice may be helpful.  You may use a stool softener, such as Colace (over the counter) 100 mg twice a day.  Use MiraLax (over the counter) for constipation as needed.   Diet - low sodium heart healthy   Complete by: As directed    Increase activity slowly as tolerated   Complete by: As directed    Weight bearing as tolerated with assist device (walker, cane, etc) as directed, use it as long as suggested by your surgeon or therapist, typically at least 4-6 weeks.   Post-operative opioid taper instructions:   Complete by: As directed    POST-OPERATIVE OPIOID TAPER INSTRUCTIONS: It is important to wean off of your opioid medication as soon as possible. If you do not need pain medication after your surgery it is ok to stop day one. Opioids include: Codeine, Hydrocodone(Norco, Vicodin), Oxycodone(Percocet, oxycontin) and hydromorphone amongst others.  Long term and even short term use of opiods can cause: Increased pain response Dependence Constipation Depression Respiratory depression And more.  Withdrawal symptoms can include Flu like symptoms Nausea, vomiting And more Techniques to manage these symptoms Hydrate well Eat regular healthy meals Stay active Use relaxation techniques(deep breathing, meditating, yoga) Do Not substitute Alcohol to help with tapering If you have been on opioids for less than two weeks and do not have pain than it is ok to stop all together.  Plan to wean off of opioids This plan should start within one week post op of your joint replacement. Maintain the same interval or time between taking each dose and first decrease the dose.  Cut the total daily intake of opioids by one tablet each day Next  start to increase the time between doses. The last dose that should be eliminated is the evening dose.      TED hose   Complete by: As directed    Use stockings (TED hose) for 2 weeks on both leg(s).  You may remove them at night for sleeping.  Follow-up Information     Irving Copas, PA-C. Go on 08/11/2021.   Specialty: Orthopedic Surgery Why: You are scheduled for a follow up appointment on 08-11-21 at 3:15 pm. Contact information: [address] [place] [address] [phone]                  Signed: Irving Copas 08/11/2021, 12:56 PM

## 2021-09-02 ENCOUNTER — Encounter: Payer: Self-pay | Admitting: Family Medicine

## 2021-09-04 ENCOUNTER — Encounter: Payer: Self-pay | Admitting: Family Medicine

## 2021-09-08 DIAGNOSIS — Z96641 Presence of right artificial hip joint: Secondary | ICD-10-CM | POA: Diagnosis not present

## 2021-09-08 DIAGNOSIS — Z471 Aftercare following joint replacement surgery: Secondary | ICD-10-CM | POA: Diagnosis not present

## 2021-09-10 ENCOUNTER — Encounter: Payer: Self-pay | Admitting: Pulmonary Disease

## 2021-09-10 DIAGNOSIS — G47419 Narcolepsy without cataplexy: Secondary | ICD-10-CM

## 2021-09-14 MED ORDER — DEXTROAMPHETAMINE SULFATE 5 MG PO TABS: 20.0000 mg | ORAL_TABLET | Freq: Every morning | ORAL | 0 refills | Status: DC

## 2021-09-14 NOTE — Telephone Encounter (Signed)
Refill sent.

## 2021-09-15 ENCOUNTER — Ambulatory Visit (INDEPENDENT_AMBULATORY_CARE_PROVIDER_SITE_OTHER): Payer: Medicare Other

## 2021-09-15 VITALS — Ht 60.0 in | Wt 112.0 lb

## 2021-09-15 DIAGNOSIS — Z1231 Encounter for screening mammogram for malignant neoplasm of breast: Secondary | ICD-10-CM

## 2021-09-15 DIAGNOSIS — Z0000 Encounter for general adult medical examination without abnormal findings: Secondary | ICD-10-CM | POA: Diagnosis not present

## 2021-09-15 NOTE — Progress Notes (Signed)
Subjective:   Brandy [name] is a 71 y.o. female who presents for Medicare Annual (Subsequent) preventive examination.   Virtual Visit via Telephone Note  I connected with  Brandy [name] on 09/15/21 at 10:30 AM EDT by telephone and verified that I am speaking with the correct person using two identifiers.  Location: Patient: home  Provider: [place]  Persons participating in the virtual visit: patient/Nurse Health Advisor   I discussed the limitations, risks, security and privacy concerns of performing an evaluation and management service by telephone and the availability of in person appointments. The patient expressed understanding and agreed to proceed.  Interactive audio and video telecommunications were attempted between this nurse and patient, however failed, due to patient having technical difficulties OR patient did not have access to video capability.  We continued and completed visit with audio only.  Some vital signs may be absent or patient reported.   Daphane Shepherd, LPN  Review of Systems     Cardiac Risk Factors include: advanced age (>8mn, >>64women);hypertension     Objective:    Today's Vitals   09/15/21 1038  Weight: 112 lb (50.8 kg)  Height: 5' (1.524 m)   Body mass index is 21.87 kg/m.     09/15/2021   10:45 AM 07/27/2021    3:20 PM 07/21/2021    1:11 PM 06/11/2020    8:02 PM 12/03/2019    9:59 AM 10/24/2018   11:09 AM 05/24/2017    1:38 PM  Advanced Directives  Does Patient Have a Medical Advance Directive? Yes Yes Yes No No No Yes  Type of AParamed[place]of [place] will [place]Living will [place] will      Does patient want to make changes to medical advance directive?  No - Patient declined       Copy of [place]? No - copy requested        Would patient like information on creating a medical advance directive?    No - Patient  declined Yes (MAU/Ambulatory/Procedural Areas - Information given) No - Patient declined     Current Medications (verified) Outpatient Encounter Medications as of 09/15/2021  Medication Sig   acetaminophen (TYLENOL) 650 MG CR tablet Take 1,300 mg by mouth in the morning and at bedtime.   celecoxib (CELEBREX) 200 MG capsule Take 200 mg by mouth at bedtime.   chlorthalidone (HYGROTON) 25 MG tablet Take 0.5 tablets (12.5 mg total) by mouth daily.   Cholecalciferol (VITAMIN D3 PO) Take 2,000 Units by mouth daily with supper.   COLLAGEN-VITAMIN C-BIOTIN PO Take 3 capsules by mouth daily with supper.   dextroamphetamine (DEXTROSTAT) 5 MG tablet Take 4 tablets (20 mg total) by mouth every morning.   famotidine (PEPCID) 10 MG tablet Take 10 mg by mouth at bedtime.   FLUoxetine (PROZAC) 20 MG capsule Take 1 capsule (20 mg total) by mouth daily. (Patient taking differently: Take 20 mg by mouth at bedtime.)   methocarbamol (ROBAXIN) 500 MG tablet Take 1 tablet (500 mg total) by mouth every 6 (six) hours as needed for muscle spasms.   Multiple Vitamin (MULTIVITAMIN WITH MINERALS) TABS tablet Take 1 tablet by mouth daily with supper. [facility] [address] [employer]! Once Daily Women's 50+   valsartan (DIOVAN) 320 MG tablet Take 1 tablet (320 mg total) by mouth daily. (Patient taking differently: Take 320 mg by mouth at bedtime.)   docusate sodium (COLACE) 100 MG capsule Take  1 capsule (100 mg total) by mouth 2 (two) times daily.   HYDROmorphone (DILAUDID) 2 MG tablet Take 0.5-1 tablets (1-2 mg total) by mouth every 4 (four) hours as needed for severe pain.   polyethylene glycol (MIRALAX / GLYCOLAX) 17 g packet Take 17 g by mouth daily as needed for mild constipation.   psyllium (METAMUCIL SMOOTH TEXTURE) 28 % packet Take 1 packet by mouth in the morning.   rosuvastatin (CRESTOR) 40 MG tablet Take 1 tablet (40 mg total) by mouth daily. (Patient taking differently: Take 40 mg by mouth at bedtime.)   No  facility-administered encounter medications on file as of 09/15/2021.    Allergies (verified) Penicillins, Talwin [pentazocine], Bextra [valdecoxib], Codeine, Lactose intolerance (gi), Meperidine hcl, and Morphine   History: Past Medical History:  Diagnosis Date   Anxiety    Arthritis    Bipolar 1 disorder (HCC)    CAD (coronary artery disease) 2007   CABG X 1   COVID 03/04/2021   Depression    controlled   Fibromyalgia    GERD (gastroesophageal reflux disease)    Hay fever    Headache    Hx of migraines   Heart murmur    Hyperlipidemia    Hypertension    Idiopathic hypersomnia    Mitral valve prolapse    Narcolepsy    PONV (postoperative nausea and vomiting)    Urinary incontinence    Past Surgical History:  Procedure Laterality Date   cataract surgery     BIL   CORONARY ARTERY BYPASS GRAFT  03/21/2005   off pump LIMA-LAD   myomectomy  1981   reconstructive surgery  1945   lip    REPLACEMENT TOTAL HIP W/  RESURFACING IMPLANTS Right    ROTATOR CUFF REPAIR  1998   3 times   SHOULDER ARTHROSCOPY  2006   TOTAL ABDOMINAL HYSTERECTOMY  1991   TOTAL HIP ARTHROPLASTY Left 03/01/2016   Procedure: LEFT TOTAL HIP ARTHROPLASTY ANTERIOR APPROACH;  Surgeon: Paralee Cancel, MD;  Location: [facility];  Service: Orthopedics;  Laterality: Left;  Requests 70 mins   TOTAL HIP ARTHROPLASTY Right 07/27/2021   Procedure: TOTAL HIP ARTHROPLASTY ANTERIOR APPROACH;  Surgeon: Paralee Cancel, MD;  Location: [facility];  Service: Orthopedics;  Laterality: Right;   trach     VESICOVAGINAL FISTULA CLOSURE W/ TAH     Family History  Problem Relation Age of Onset   Stroke Mother        died 21   Aneurysm Mother    Hypertension Mother    Kidney disease Mother    Bipolar disorder Mother    Lung cancer Father        died 84   Bipolar disorder Other    Hypertension Other    Osteoporosis Maternal Grandmother    Arthritis Maternal Grandmother    Alcohol abuse Maternal Grandfather    Social History    Socioeconomic History   Marital status: Divorced    Spouse name: Not on file   Number of children: 0   Years of education: Not on file   Highest education level: Not on file  Occupational History   Occupation: retired  Tobacco Use   Smoking status: Never   Smokeless tobacco: Never  Vaping Use   Vaping Use: Never used  Substance and Sexual Activity   Alcohol use: No   Drug use: No   Sexual activity: Yes  Other Topics Concern   Not on file  Social History Narrative   Not  on file   Social Determinants of Health   Financial Resource Strain: Low Risk  (09/15/2021)   Overall Financial Resource Strain (CARDIA)    Difficulty of Paying Living Expenses: Not hard at all  Food Insecurity: No Food Insecurity (09/15/2021)   Hunger Vital Sign    Worried About Running Out of Food in the Last Year: Never true    Ran Out of Food in the Last Year: Never true  Transportation Needs: No Transportation Needs (09/15/2021)   PRAPARE - Hydrologist (Medical): No    Lack of Transportation (Non-Medical): No  Physical Activity: Insufficiently Active (09/15/2021)   Exercise Vital Sign    Days of Exercise per Week: 3 days    Minutes of Exercise per Session: 30 min  Stress: No Stress Concern Present (09/15/2021)   [place]    Feeling of Stress : Not at all  Social Connections: Moderately Isolated (09/15/2021)   Social Connection and Isolation Panel [NHANES]    Frequency of Communication with Friends and Family: More than three times a week    Frequency of Social Gatherings with Friends and Family: More than three times a week    Attends Religious Services: More than 4 times per year    Active Member of [employer] or Organizations: No    Attends Music therapist: Never    Marital Status: Divorced    Tobacco Counseling Counseling given: Not Answered   Clinical Intake:  Pre-visit preparation  completed: Yes  Pain : No/denies pain     Nutritional Risks: None Diabetes: No  How often do you need to have someone help you when you read instructions, pamphlets, or other written materials from your doctor or pharmacy?: 1 - Never  Diabetic?no   Interpreter Needed?: No  Information entered by :: Jadene Pierini , LPN   Activities of Daily Living    09/15/2021   10:45 AM 09/14/2021   11:29 AM  In your present state of health, do you have any difficulty performing the following activities:  Hearing? 0 0  Vision? 0 0  Difficulty concentrating or making decisions? 0 0  Walking or climbing stairs? 0 0  Dressing or bathing? 0 0  Doing errands, shopping? 0 0  Preparing Food and eating ? N N  Using the Toilet? N N  In the past six months, have you accidently leaked urine? N Y  Do you have problems with loss of bowel control? Tempie Donning  Comment under treatment Dr Hilarie Fredrickson   Managing your Medications? N N  Managing your Finances? N N  Housekeeping or managing your Housekeeping? N N    Patient Care Team: Haydee Salter, MD as PCP - General (Family Medicine) Troy Sine, MD as Consulting Physician (Cardiology) Pyrtle, Lajuan Lines, MD as Consulting Physician (Gastroenterology) [name], MD as Consulting Physician (Pulmonary Disease) Suella Broad, MD as Consulting Physician (Physical Medicine and Rehabilitation)  Indicate any recent Medical Services you may have received from other than [facility] providers in the past year (date may be approximate).     Assessment:   This is a routine wellness examination for [place].  Hearing/Vision screen Vision Screening - Comments:: Due annual eye exams   Dietary issues and exercise activities discussed: Current Exercise Habits: Home exercise routine, Type of exercise: strength training/weights, Time (Minutes): 30, Frequency (Times/Week): 3, Weekly Exercise (Minutes/Week): 90, Intensity: Mild, Exercise limited by: orthopedic condition(s)   Goals  Addressed  This Visit's Progress    Patient Stated   On track    Do more aerobic & weight bearing exercises       Depression Screen    09/15/2021   10:47 AM 09/15/2021   10:43 AM 06/29/2021    1:20 PM 12/03/2019   10:07 AM 10/24/2018   11:11 AM 05/24/2017    1:40 PM 09/20/2016    3:27 PM  PHQ 2/9 Scores  PHQ - 2 Score 0 0 0 0 0 0 0    Fall Risk    09/15/2021   10:39 AM 09/14/2021   11:29 AM 06/29/2021    1:20 PM 03/09/2020    1:24 PM 12/03/2019   10:06 AM  Fall Risk   Falls in the past year? 0 0 0 0 0  Number falls in past yr: 0  0 0 0  Injury with Fall? 0  0 0 0  Risk for fall due to : No Fall Risks  No Fall Risks    Follow up Falls prevention discussed  Falls evaluation completed  Falls prevention discussed    FALL RISK PREVENTION PERTAINING TO THE HOME:  Any stairs in or around the home? Yes  If so, are there any without handrails? No  Home free of loose throw rugs in walkways, pet beds, electrical cords, etc? Yes  Adequate lighting in your home to reduce risk of falls? Yes   ASSISTIVE DEVICES UTILIZED TO PREVENT FALLS:  Life alert? No  Use of a cane, walker or w/c? Yes  Grab bars in the bathroom? No  Shower chair or bench in shower? No  Elevated toilet seat or a handicapped toilet? No   Immunizations Immunization History  Administered Date(s) Administered   Fluad Quad(high Dose 65+) 09/26/2018, 10/09/2019   Influenza Split 10/08/2016   Influenza Whole 10/10/2009   Influenza, High Dose Seasonal PF 11/09/2017   Influenza-Unspecified 11/11/2010, 09/26/2020   PFIZER(Purple Top)SARS-COV-2 Vaccination 02/15/2019, 03/12/2019, 10/07/2019, 04/18/2020, 09/26/2020   Pfizer Covid-19 Vaccine Bivalent Booster 15yr & up 06/26/2020   Pneumococcal Conjugate-13 11/09/2017   Pneumococcal Polysaccharide-23 10/09/2019   Tdap 07/25/2018   Varicella 07/25/2018, 08/23/2018    TDAP status: Up to date  Flu Vaccine status: Up to date  Pneumococcal vaccine status:  Up to date  Covid-19 vaccine status: Completed vaccines  Qualifies for Shingles Vaccine? Yes   Zostavax completed No   Shingrix Completed?: No.    Education has been provided regarding the importance of this vaccine. Patient has been advised to call insurance company to determine out of pocket expense if they have not yet received this vaccine. Advised may also receive vaccine at local pharmacy or Health Dept. Verbalized acceptance and understanding.  Screening Tests Health Maintenance  Topic Date Due   Zoster Vaccines- Shingrix (1 of 2) Never done   MAMMOGRAM  09/26/2020   COVID-19 Vaccine (7 - Pfizer risk series) 11/21/2020   INFLUENZA VACCINE  08/10/2021   TETANUS/TDAP  07/24/2028   COLONOSCOPY (Pts 45-466yrInsurance coverage will need to be confirmed)  04/03/2031   Pneumonia Vaccine 6576Years old  Completed   DEXA SCAN  Completed   Hepatitis C Screening  Completed   HPV VACCINES  Aged Out    Health Maintenance  Health Maintenance Due  Topic Date Due   Zoster Vaccines- Shingrix (1 of 2) Never done   MAMMOGRAM  09/26/2020   COVID-19 Vaccine (7 - Pfizer risk series) 11/21/2020   INFLUENZA VACCINE  08/10/2021    Colorectal cancer screening:  Type of screening: Colonoscopy. Completed 04/02/2021. Repeat every 10 years  Mammogram status: Ordered 09/15/2021. Pt provided with contact info and advised to call to schedule appt.   Bone Density status: Completed 09/13/2017. Results reflect: Bone density results: OSTEOPENIA. Repeat every 5 years.  Lung Cancer Screening: (Low Dose CT Chest recommended if Age 51-80 years, 30 pack-year currently smoking OR have quit w/in 15years.) does not qualify.   Lung Cancer Screening Referral: n/a  Additional Screening:  Hepatitis C Screening: does not qualify; Completed 01/11/2016  Vision Screening: Recommended annual ophthalmology exams for early detection of glaucoma and other disorders of the eye. Is the patient up to date with their annual  eye exam?  Yes  Who is the provider or what is the name of the office in which the patient attends annual eye exams? [facility]  If pt is not established with a provider, would they like to be referred to a provider to establish care? No .   Dental Screening: Recommended annual dental exams for proper oral hygiene  Community Resource Referral / Chronic Care Management: CRR required this visit?  No   CCM required this visit?  No      Plan:     I have personally reviewed and noted the following in the patient's chart:   Medical and social history Use of alcohol, tobacco or illicit drugs  Current medications and supplements including opioid prescriptions. Patient is not currently taking opioid prescriptions. Functional ability and status Nutritional status Physical activity Advanced directives List of other physicians Hospitalizations, surgeries, and ER visits in previous 12 months Vitals Screenings to include cognitive, depression, and falls Referrals and appointments  In addition, I have reviewed and discussed with patient certain preventive protocols, quality metrics, and best practice recommendations. A written personalized care plan for preventive services as well as general preventive health recommendations were provided to patient.     Daphane Shepherd, LPN   02/18/7679   Nurse Notes: none

## 2021-09-15 NOTE — Patient Instructions (Signed)
Brandy [name] , Thank you for taking time to come for your Medicare Wellness Visit. I appreciate your ongoing commitment to your health goals. Please review the following plan we discussed and let me know if I can assist you in the future.   Screening recommendations/referrals: Colonoscopy: 04/02/2021 Mammogram: referral 09/15/2021 Bone Density: 09/13/2017 Recommended yearly ophthalmology/optometry visit for glaucoma screening and checkup Recommended yearly dental visit for hygiene and checkup  Vaccinations: Influenza vaccine: due in the fall  Pneumococcal vaccine: completed  Tdap vaccine: 07/25/2018 Shingles vaccine: varicella    Covid-19:completed   Advanced directives: Please bring a copy of your health care power of attorney and living will to the office to be added to your chart at your convenience.   Conditions/risks identified: Aim for 30 minutes of exercise or brisk walking, 6-8 glasses of water, and 5 servings of fruits and vegetables each day.   Next appointment: Follow up in one year for your annual wellness visit    Preventive Care 65 Years and Older, Female Preventive care refers to lifestyle choices and visits with your health care provider that can promote health and wellness. What does preventive care include? A yearly physical exam. This is also called an annual well check. Dental exams once or twice a year. Routine eye exams. Ask your health care provider how often you should have your eyes checked. Personal lifestyle choices, including: Daily care of your teeth and gums. Regular physical activity. Eating a healthy diet. Avoiding tobacco and drug use. Limiting alcohol use. Practicing safe sex. Taking low-dose aspirin every day. Taking vitamin and mineral supplements as recommended by your health care provider. What happens during an annual well check? The services and screenings done by your health care provider during your annual well check will depend on  your age, overall health, lifestyle risk factors, and family history of disease. Counseling  Your health care provider may ask you questions about your: Alcohol use. Tobacco use. Drug use. Emotional well-being. Home and relationship well-being. Sexual activity. Eating habits. History of falls. Memory and ability to understand (cognition). Work and work Statistician. Reproductive health. Screening  You may have the following tests or measurements: Height, weight, and BMI. Blood pressure. Lipid and cholesterol levels. These may be checked every 5 years, or more frequently if you are over 45 years old. Skin check. Lung cancer screening. You may have this screening every year starting at age 42 if you have a 30-pack-year history of smoking and currently smoke or have quit within the past 15 years. Fecal occult blood test (FOBT) of the stool. You may have this test every year starting at age 11. Flexible sigmoidoscopy or colonoscopy. You may have a sigmoidoscopy every 5 years or a colonoscopy every 10 years starting at age 6. Hepatitis C blood test. Hepatitis B blood test. Sexually transmitted disease (STD) testing. Diabetes screening. This is done by checking your blood sugar (glucose) after you have not eaten for a while (fasting). You may have this done every 1-3 years. Bone density scan. This is done to screen for osteoporosis. You may have this done starting at age 61. Mammogram. This may be done every 1-2 years. Talk to your health care provider about how often you should have regular mammograms. Talk with your health care provider about your test results, treatment options, and if necessary, the need for more tests. Vaccines  Your health care provider may recommend certain vaccines, such as: Influenza vaccine. This is recommended every year. Tetanus, diphtheria, and acellular pertussis (  Tdap, Td) vaccine. You may need a Td booster every 10 years. Zoster vaccine. You may need this  after age 55. Pneumococcal 13-valent conjugate (PCV13) vaccine. One dose is recommended after age 28. Pneumococcal polysaccharide (PPSV23) vaccine. One dose is recommended after age 29. Talk to your health care provider about which screenings and vaccines you need and how often you need them. This information is not intended to replace advice given to you by your health care provider. Make sure you discuss any questions you have with your health care provider. Document Released: 01/23/2015 Document Revised: 09/16/2015 Document Reviewed: 10/28/2014 Elsevier Interactive Patient Education  2017 [place] Prevention in the Home Falls can cause injuries. They can happen to people of all ages. There are many things you can do to make your home safe and to help prevent falls. What can I do on the outside of my home? Regularly fix the edges of walkways and driveways and fix any cracks. Remove anything that might make you trip as you walk through a door, such as a raised step or threshold. Trim any bushes or trees on the path to your home. Use bright outdoor lighting. Clear any walking paths of anything that might make someone trip, such as rocks or tools. Regularly check to see if handrails are loose or broken. Make sure that both sides of any steps have handrails. Any raised decks and porches should have guardrails on the edges. Have any leaves, snow, or ice cleared regularly. Use sand or salt on walking paths during winter. Clean up any spills in your garage right away. This includes oil or grease spills. What can I do in the bathroom? Use night lights. Install grab bars by the toilet and in the tub and shower. Do not use towel bars as grab bars. Use non-skid mats or decals in the tub or shower. If you need to sit down in the shower, use a plastic, non-slip stool. Keep the floor dry. Clean up any water that spills on the floor as soon as it happens. Remove soap buildup in the tub or  shower regularly. Attach bath mats securely with double-sided non-slip rug tape. Do not have throw rugs and other things on the floor that can make you trip. What can I do in the bedroom? Use night lights. Make sure that you have a light by your bed that is easy to reach. Do not use any sheets or blankets that are too big for your bed. They should not hang down onto the floor. Have a firm chair that has side arms. You can use this for support while you get dressed. Do not have throw rugs and other things on the floor that can make you trip. What can I do in the kitchen? Clean up any spills right away. Avoid walking on wet floors. Keep items that you use a lot in easy-to-reach places. If you need to reach something above you, use a strong step stool that has a grab bar. Keep electrical cords out of the way. Do not use floor polish or wax that makes floors slippery. If you must use wax, use non-skid floor wax. Do not have throw rugs and other things on the floor that can make you trip. What can I do with my stairs? Do not leave any items on the stairs. Make sure that there are handrails on both sides of the stairs and use them. Fix handrails that are broken or loose. Make sure that handrails are  as long as the stairways. Check any carpeting to make sure that it is firmly attached to the stairs. Fix any carpet that is loose or worn. Avoid having throw rugs at the top or bottom of the stairs. If you do have throw rugs, attach them to the floor with carpet tape. Make sure that you have a light switch at the top of the stairs and the bottom of the stairs. If you do not have them, ask someone to add them for you. What else can I do to help prevent falls? Wear shoes that: Do not have high heels. Have rubber bottoms. Are comfortable and fit you well. Are closed at the toe. Do not wear sandals. If you use a stepladder: Make sure that it is fully opened. Do not climb a closed stepladder. Make  sure that both sides of the stepladder are locked into place. Ask someone to hold it for you, if possible. Clearly mark and make sure that you can see: Any grab bars or handrails. First and last steps. Where the edge of each step is. Use tools that help you move around (mobility aids) if they are needed. These include: Canes. Walkers. Scooters. Crutches. Turn on the lights when you go into a dark area. Replace any light bulbs as soon as they burn out. Set up your furniture so you have a clear path. Avoid moving your furniture around. If any of your floors are uneven, fix them. If there are any pets around you, be aware of where they are. Review your medicines with your doctor. Some medicines can make you feel dizzy. This can increase your chance of falling. Ask your doctor what other things that you can do to help prevent falls. This information is not intended to replace advice given to you by your health care provider. Make sure you discuss any questions you have with your health care provider. Document Released: 10/23/2008 Document Revised: 06/04/2015 Document Reviewed: 01/31/2014 Elsevier Interactive Patient Education  2017 [employer].

## 2021-09-19 DIAGNOSIS — H7292 Unspecified perforation of tympanic membrane, left ear: Secondary | ICD-10-CM | POA: Insufficient documentation

## 2021-09-29 ENCOUNTER — Encounter: Payer: Self-pay | Admitting: Family Medicine

## 2021-09-29 DIAGNOSIS — H7202 Central perforation of tympanic membrane, left ear: Secondary | ICD-10-CM | POA: Diagnosis not present

## 2021-09-29 DIAGNOSIS — H9012 Conductive hearing loss, unilateral, left ear, with unrestricted hearing on the contralateral side: Secondary | ICD-10-CM | POA: Diagnosis not present

## 2021-09-30 DIAGNOSIS — M5412 Radiculopathy, cervical region: Secondary | ICD-10-CM | POA: Diagnosis not present

## 2021-10-06 ENCOUNTER — Ambulatory Visit (INDEPENDENT_AMBULATORY_CARE_PROVIDER_SITE_OTHER): Payer: Medicare Other | Admitting: Family Medicine

## 2021-10-06 ENCOUNTER — Encounter: Payer: Self-pay | Admitting: Family Medicine

## 2021-10-06 VITALS — BP 136/80 | HR 77 | Temp 97.9°F | Wt 114.0 lb

## 2021-10-06 DIAGNOSIS — I10 Essential (primary) hypertension: Secondary | ICD-10-CM

## 2021-10-06 DIAGNOSIS — Z23 Encounter for immunization: Secondary | ICD-10-CM

## 2021-10-06 DIAGNOSIS — H7292 Unspecified perforation of tympanic membrane, left ear: Secondary | ICD-10-CM

## 2021-10-06 DIAGNOSIS — Z96641 Presence of right artificial hip joint: Secondary | ICD-10-CM | POA: Diagnosis not present

## 2021-10-06 LAB — CBC
HCT: 39.5 % (ref 36.0–46.0)
Hemoglobin: 13.4 g/dL (ref 12.0–15.0)
MCHC: 34 g/dL (ref 30.0–36.0)
MCV: 92.6 fl (ref 78.0–100.0)
Platelets: 209 10*3/uL (ref 150.0–400.0)
RBC: 4.26 Mil/uL (ref 3.87–5.11)
RDW: 13.1 % (ref 11.5–15.5)
WBC: 7.1 10*3/uL (ref 4.0–10.5)

## 2021-10-06 NOTE — Progress Notes (Signed)
[name] [address] [place] [address] Dept: [phone] Dept Fax: [phone]  Chronic Care Office Visit  Subjective:    Patient ID: Brandy [name], female    DOB: February 17, 1950, 71 y.o..   MRN: [id number]  Chief Complaint  Patient presents with   Follow-up    3 mth F/U.    History of Present Illness:  Patient is in today for reassessment of chronic medical issues.  [name] underwent a total right hip joint replacement in mid-July. She notes she has some residual pain, but this is much better than previous. She has managed her on physical therapy and is pleased with her return of function. She is participating in [employer] classes more recently. She currentl;y takes Celecoxib to manage pain when needed.   [name] has a history of having had a single-vessel CABG. She has a history of hypertension and is managed on valsartan 320 mg daily and chlorthalidone 25 mg daily. She has hyperlipidemia and is managed on rosuvastatin 40 mg daily.  [name] had a recent left TM injury form a tree limb that accidentally stuck into her ear canal. She saw Dr. Benjamine Brandy [name]. He has recommended observation for healing at this point.   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   Laryngospasms 04/22/2020   Numbness and tingling in both hands 06/20/2018   Rosacea 08/24/2017   S/P left THA, AA 03/01/2016   S/P hip replacement, left 03/01/2016   Circadian rhythm sleep disorder, 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    Hyperlipidemia    Narcolepsy without cataplexy    CAD (coronary artery disease)    Essential hypertension    Past Surgical History:  Procedure Laterality Date   cataract surgery     BIL   CORONARY ARTERY BYPASS GRAFT  03/21/2005   off pump LIMA-LAD   myomectomy  1981   reconstructive surgery  1945   lip    REPLACEMENT TOTAL HIP W/  RESURFACING IMPLANTS Right    ROTATOR CUFF REPAIR  1998   3 times   SHOULDER ARTHROSCOPY  2006   TOTAL ABDOMINAL HYSTERECTOMY  1991   TOTAL HIP ARTHROPLASTY Left 03/01/2016   Procedure: LEFT TOTAL HIP ARTHROPLASTY ANTERIOR APPROACH;  Surgeon: Paralee Cancel, MD;  Location: [facility];  Service: Orthopedics;  Laterality: Left;  Requests 70 mins   TOTAL HIP ARTHROPLASTY Right 07/27/2021   Procedure: TOTAL HIP ARTHROPLASTY ANTERIOR APPROACH;  Surgeon: Paralee Cancel, MD;  Location: [facility];  Service: Orthopedics;  Laterality: Right;   trach     VESICOVAGINAL FISTULA CLOSURE W/ TAH     Family History  Problem Relation Age of Onset   Stroke Mother        died 40   Aneurysm Mother    Hypertension Mother    Kidney disease Mother    Bipolar disorder Mother    Lung cancer Father        died 73   Bipolar disorder Other    Hypertension Other    Osteoporosis Maternal Grandmother    Arthritis Maternal Grandmother    Alcohol abuse Maternal  Grandfather    Outpatient Medications Prior to Visit  Medication Sig Dispense Refill   acetaminophen (TYLENOL) 650 MG CR tablet Take 1,300 mg by mouth in the morning and at bedtime.     celecoxib (CELEBREX) 200 MG capsule Take 200 mg by mouth at bedtime.     chlorthalidone (HYGROTON) 25 MG tablet Take 0.5 tablets (12.5 mg total) by mouth daily. 90 tablet 3   dextroamphetamine (DEXTROSTAT) 5 MG tablet Take 4 tablets (20 mg total) by mouth every morning. 120 tablet 0   famotidine (PEPCID) 10 MG tablet Take 10 mg by mouth at bedtime.     FLUoxetine (PROZAC) 20 MG capsule Take 1 capsule (20 mg total) by mouth daily. (Patient  taking differently: Take 20 mg by mouth at bedtime.) 90 capsule 3   methocarbamol (ROBAXIN) 500 MG tablet Take 1 tablet (500 mg total) by mouth every 6 (six) hours as needed for muscle spasms. 40 tablet 0   Multiple Vitamin (MULTIVITAMIN WITH MINERALS) TABS tablet Take 1 tablet by mouth daily with supper. [facility]! Once Daily Women's 50+     valsartan (DIOVAN) 320 MG tablet Take 1 tablet (320 mg total) by mouth daily. (Patient taking differently: Take 320 mg by mouth at bedtime.) 90 tablet 3   psyllium (METAMUCIL SMOOTH TEXTURE) 28 % packet Take 1 packet by mouth in the morning.     Cholecalciferol (VITAMIN D3 PO) Take 2,000 Units by mouth daily with supper.     COLLAGEN-VITAMIN C-BIOTIN PO Take 3 capsules by mouth daily with supper.     rosuvastatin (CRESTOR) 40 MG tablet Take 1 tablet (40 mg total) by mouth daily. (Patient taking differently: Take 40 mg by mouth at bedtime.) 90 tablet 3   docusate sodium (COLACE) 100 MG capsule Take 1 capsule (100 mg total) by mouth 2 (two) times daily. (Patient not taking: Reported on 10/06/2021) 10 capsule 0   HYDROmorphone (DILAUDID) 2 MG tablet Take 0.5-1 tablets (1-2 mg total) by mouth every 4 (four) hours as needed for severe pain. (Patient not taking: Reported on 10/06/2021) 42 tablet 0   polyethylene glycol (MIRALAX / GLYCOLAX) 17 g packet Take 17 g by mouth daily as needed for mild constipation. (Patient not taking: Reported on 10/06/2021) 14 each 0   No facility-administered medications prior to visit.   Allergies  Allergen Reactions   Penicillins Anaphylaxis    Has patient had a PCN reaction causing immediate rash, facial/tongue/throat swelling, SOB or lightheadedness with hypotension: Yes Has patient had a PCN reaction causing severe rash involving mucus membranes or skin necrosis: No Has patient had a PCN reaction that required hospitalization Yes Has patient had a PCN reaction occurring within the last 10 years: No If all of the above  answers are "NO", then may proceed with Cephalosporin use.   Talwin [Pentazocine] Other (See Comments)    Uncontrolled shaking/tremors   Bextra [Valdecoxib] Hives   Codeine Nausea And Vomiting   Lactose Intolerance (Gi) Diarrhea   Meperidine Hcl Nausea And Vomiting   Morphine Nausea And Vomiting   Objective:   Today's Vitals   10/06/21 1029  BP: 136/80  Pulse: 77  Temp: 97.9 F (36.6 C)  TempSrc: Oral  SpO2: 95%  Weight: 114 lb (51.7 kg)   Body mass index is 22.26 kg/m.   General: Well developed, well nourished. No acute distress. HEENT: Normocephalic, non-traumatic. External ears normal. Left TM has a blood clot   towards the center of the drum.   Extremities: Good  ROM and normal tandem gait.  Psych: Alert and oriented. Normal mood and affect.  Health Maintenance Due  Topic Date Due   Zoster Vaccines- Shingrix (1 of 2) Never done   MAMMOGRAM  09/26/2020   COVID-19 Vaccine (7 - Pfizer risk series) 11/21/2020   INFLUENZA VACCINE  08/10/2021     Lab Results:    Latest Ref Rng & Units 07/28/2021    3:13 AM 07/09/2021    8:49 AM 06/29/2021    2:16 PM  BMP  Glucose 70 - 99 mg/dL 140  100  114   BUN 8 - 23 mg/dL 22  32  31   Creatinine 0.44 - 1.00 mg/dL 1.01  1.04  0.93   BUN/Creat Ratio 12 - 28  31    Sodium 135 - 145 mmol/L 140  141  139   Potassium 3.5 - 5.1 mmol/L 4.5  4.7  3.9   Chloride 98 - 111 mmol/L 106  101  103   CO2 22 - 32 mmol/L '25  24  26   '$ Calcium 8.9 - 10.3 mg/dL 9.0  9.8  9.6       Latest Ref Rng & Units 07/28/2021    3:13 AM 07/09/2021    8:49 AM 06/29/2021    2:16 PM  CBC  WBC 4.0 - 10.5 K/uL 10.0  6.9  7.5   Hemoglobin 12.0 - 15.0 g/dL 10.2  13.7  13.4   Hematocrit 36.0 - 46.0 % 31.2  40.5  40.4   Platelets 150 - 400 K/uL 191  284  257.0    Assessment & Plan:   1. Essential hypertension Blood pressure is acceptable today. She notes she gets better pressures at home. I will continue valsartan 320 mg daily and chlorthalidone 25 mg  daily.  2. S/P total right hip arthroplasty Progressing very well since surgery. Ms. Knee had mild post-operative anemia. I will repeat CBC today to make sure this has corrected.  - CBC  3. Perforation of ear drum, left Appears to be healing. She will follow-up with Dr. Benjamine Brandy [name].  Return in about 3 months (around 01/05/2022) for Reassessment.   Haydee Salter, MD

## 2021-10-07 ENCOUNTER — Ambulatory Visit
Admission: RE | Admit: 2021-10-07 | Discharge: 2021-10-07 | Disposition: A | Discharge status: Home / Self Care | Payer: Medicare Other | Source: Ambulatory Visit | Attending: Family Medicine | Admitting: Family Medicine

## 2021-10-07 DIAGNOSIS — Z1231 Encounter for screening mammogram for malignant neoplasm of breast: Secondary | ICD-10-CM

## 2021-10-11 ENCOUNTER — Other Ambulatory Visit: Payer: Self-pay | Admitting: Family Medicine

## 2021-10-11 DIAGNOSIS — R928 Other abnormal and inconclusive findings on diagnostic imaging of breast: Secondary | ICD-10-CM

## 2021-10-13 DIAGNOSIS — Z23 Encounter for immunization: Secondary | ICD-10-CM | POA: Diagnosis not present

## 2021-10-13 DIAGNOSIS — M25511 Pain in right shoulder: Secondary | ICD-10-CM | POA: Diagnosis not present

## 2021-10-13 DIAGNOSIS — M25811 Other specified joint disorders, right shoulder: Secondary | ICD-10-CM | POA: Diagnosis not present

## 2021-10-14 ENCOUNTER — Encounter: Payer: Self-pay | Admitting: Family Medicine

## 2021-10-15 ENCOUNTER — Encounter: Payer: Self-pay | Admitting: Family Medicine

## 2021-10-19 ENCOUNTER — Ambulatory Visit: Payer: Medicare Other | Admitting: Psychiatry

## 2021-10-20 DIAGNOSIS — Z5189 Encounter for other specified aftercare: Secondary | ICD-10-CM | POA: Diagnosis not present

## 2021-10-22 ENCOUNTER — Encounter: Payer: Self-pay | Admitting: Pulmonary Disease

## 2021-10-22 DIAGNOSIS — G47419 Narcolepsy without cataplexy: Secondary | ICD-10-CM

## 2021-10-22 MED ORDER — DEXTROAMPHETAMINE SULFATE 5 MG PO TABS: 20.0000 mg | ORAL_TABLET | Freq: Every morning | ORAL | 0 refills | Status: DC

## 2021-10-22 NOTE — Telephone Encounter (Signed)
Refill sent.

## 2021-10-22 NOTE — Telephone Encounter (Signed)
Dr. Sood, please see mychart message sent by pt and advise. °

## 2021-10-25 ENCOUNTER — Ambulatory Visit
Admission: RE | Admit: 2021-10-25 | Discharge: 2021-10-25 | Disposition: A | Discharge status: Home / Self Care | Payer: Medicare Other | Source: Ambulatory Visit | Attending: Family Medicine | Admitting: Family Medicine

## 2021-10-25 ENCOUNTER — Encounter: Payer: Self-pay | Admitting: Family Medicine

## 2021-10-25 DIAGNOSIS — R928 Other abnormal and inconclusive findings on diagnostic imaging of breast: Secondary | ICD-10-CM

## 2021-10-25 DIAGNOSIS — N6002 Solitary cyst of left breast: Secondary | ICD-10-CM | POA: Diagnosis not present

## 2021-10-25 DIAGNOSIS — N6311 Unspecified lump in the right breast, upper outer quadrant: Secondary | ICD-10-CM | POA: Diagnosis not present

## 2021-10-27 DIAGNOSIS — H838X3 Other specified diseases of inner ear, bilateral: Secondary | ICD-10-CM | POA: Diagnosis not present

## 2021-10-27 DIAGNOSIS — H7202 Central perforation of tympanic membrane, left ear: Secondary | ICD-10-CM | POA: Diagnosis not present

## 2021-10-27 DIAGNOSIS — H903 Sensorineural hearing loss, bilateral: Secondary | ICD-10-CM | POA: Diagnosis not present
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
## 2021-11-05 ENCOUNTER — Encounter: Payer: Self-pay | Admitting: Obstetrics and Gynecology

## 2021-11-05 ENCOUNTER — Ambulatory Visit (INDEPENDENT_AMBULATORY_CARE_PROVIDER_SITE_OTHER): Payer: Medicare Other | Admitting: Obstetrics and Gynecology

## 2021-11-05 VITALS — BP 163/81 | HR 75 | Ht <= 58 in | Wt 111.0 lb

## 2021-11-05 DIAGNOSIS — N952 Postmenopausal atrophic vaginitis: Secondary | ICD-10-CM | POA: Diagnosis not present

## 2021-11-05 DIAGNOSIS — L900 Lichen sclerosus et atrophicus: Secondary | ICD-10-CM

## 2021-11-05 DIAGNOSIS — N3281 Overactive bladder: Secondary | ICD-10-CM | POA: Diagnosis not present

## 2021-11-05 DIAGNOSIS — R159 Full incontinence of feces: Secondary | ICD-10-CM | POA: Diagnosis not present

## 2021-11-05 MED ORDER — ESTRADIOL 0.1 MG/GM VA CREA: 0.5000 g | TOPICAL_CREAM | VAGINAL | 11 refills | Status: AC

## 2021-11-05 MED ORDER — CLOBETASOL PROP EMOLLIENT BASE 0.05 % EX CREA: 0.5000 g | TOPICAL_CREAM | Freq: Every evening | CUTANEOUS | 11 refills | Status: AC

## 2021-11-05 NOTE — Patient Instructions (Signed)
Start vaginal estrogen therapy nightly for two weeks then 2 times weekly at night for treatment of vaginal atrophy (dryness of the vaginal tissues).  Please let us know if the prescription is too expensive and we can look for alternative options.    For clobetasol, use on labia and around vaginal opening twice a day for one month, then decrease to daily for one month then decrease to 3 times per week (indefinitely).

## 2021-11-23 ENCOUNTER — Encounter (HOSPITAL_BASED_OUTPATIENT_CLINIC_OR_DEPARTMENT_OTHER): Payer: Self-pay | Admitting: Pulmonary Disease

## 2021-11-29 NOTE — Telephone Encounter (Signed)
Patient's mychart message was sent to sleep center instead of our office- please advise on medication refill. Patient only has 2 tablets. Marking as urgent due to time. Please call patient at [phone]

## 2021-11-30 ENCOUNTER — Telehealth: Payer: Self-pay | Admitting: Pulmonary Disease

## 2021-11-30 ENCOUNTER — Encounter: Payer: Self-pay | Admitting: Internal Medicine

## 2021-11-30 ENCOUNTER — Ambulatory Visit: Payer: Medicare Other | Attending: Internal Medicine | Admitting: Internal Medicine

## 2021-11-30 VITALS — BP 122/66 | HR 59 | Ht 60.0 in | Wt 114.0 lb

## 2021-11-30 DIAGNOSIS — I2510 Atherosclerotic heart disease of native coronary artery without angina pectoris: Secondary | ICD-10-CM

## 2021-11-30 DIAGNOSIS — I2583 Coronary atherosclerosis due to lipid rich plaque: Secondary | ICD-10-CM | POA: Diagnosis not present

## 2021-11-30 DIAGNOSIS — E785 Hyperlipidemia, unspecified: Secondary | ICD-10-CM | POA: Diagnosis not present

## 2021-11-30 DIAGNOSIS — Z01810 Encounter for preprocedural cardiovascular examination: Secondary | ICD-10-CM

## 2021-11-30 DIAGNOSIS — Z951 Presence of aortocoronary bypass graft: Secondary | ICD-10-CM | POA: Diagnosis not present

## 2021-11-30 DIAGNOSIS — G47419 Narcolepsy without cataplexy: Secondary | ICD-10-CM

## 2021-11-30 DIAGNOSIS — I10 Essential (primary) hypertension: Secondary | ICD-10-CM | POA: Diagnosis not present

## 2021-11-30 MED ORDER — DEXTROAMPHETAMINE SULFATE 5 MG PO TABS: 20.0000 mg | ORAL_TABLET | Freq: Every morning | ORAL | 0 refills | Status: DC

## 2021-11-30 NOTE — Telephone Encounter (Signed)
Called patient and she states that she is wanting her medication filled now. She states she is completely out and needs it filled now. She states the medication is Dextroamphetamine   Please advise sarah since Dr Halford Chessman is out of office

## 2021-11-30 NOTE — Patient Instructions (Signed)
Medication Instructions:  No Changes In Medications at this time.  *If you need a refill on your cardiac medications before your next appointment, please call your pharmacy*  Follow-Up: At [facility], you and your health needs are our priority.  As part of our continuing mission to provide you with exceptional heart care, we have created designated Provider Care Teams.  These Care Teams include your primary Cardiologist (physician) and Advanced Practice Providers (APPs -  Physician Assistants and Nurse Practitioners) who all work together to provide you with the care you need, when you need it.  Your next appointment:   1 year(s)  The format for your next appointment:   In Person  Provider:   Gayatri A Acharya, MD          

## 2021-11-30 NOTE — Telephone Encounter (Signed)
Patient called to get a rush on a prescription that she said she is totally out of, dextroamphetamine. Patient would like to get this filled asap.  Please advise.

## 2021-11-30 NOTE — Progress Notes (Signed)
. Cardiology Office Note:    Date:  11/30/2021  ID:  Brandy [name], DOB 15-Nov-1950, MRN [id number]  PCP:  Haydee Salter, MD  Cardiologist:  Elouise Munroe, MD  Electrophysiologist:  None   Referring MD: Haydee Salter, MD   Chief Complaint: Follow up  History of Present Illness:    Brandy [name] is a 71 y.o. 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 Who presents today for follow up.  She is planning to have a two stage interstim implantation on 01/31/2022 and 02/14/2022.   At her last visit with Dr. Claiborne Billings she felt well. Repeat laboratory showed improvement in renal function with a creatinine at 0.84 and BUN at 28.  On rosuvastatin, lipid studies have showed marked improvement in LDL cholesterol from 176 down to 89 with total cholesterol 193 although triglycerides are mildly increased at 178 on labs on November 26, 2019.   Today, the patient states that she has been feeling well. She takes a Chlorthalidone half tablet in the morning. She states that her average at home has been well below the 140s. It does worsen her urgency, but she is not concerned as she hopes that the surgery will resolve her bladder issues.  She had a right hip replacement in July after her left hip replacement a few years ago She uses Tylenol and Celebrex for pain management from her hip replacement.  She takes dextroamphetamine for narcolepsy and has had trouble refilling her medication. She notes that it elevates her blood pressure and heart rate as well, as expected.  She denies any palpitations, chest pain, shortness of breath, or peripheral edema. No lightheadedness, headaches, syncope, orthopnea, or PND.   Past Medical History:  Diagnosis Date   Arthritis    CAD (coronary artery disease) 2007   CABG X 1   COVID 03/04/2021   Depression    controlled   Fibromyalgia    GERD  (gastroesophageal reflux disease)    Hay fever    Headache    Hx of migraines   Heart murmur    Hyperlipidemia    Hypertension    Idiopathic hypersomnia    Mitral valve prolapse    Narcolepsy    PONV (postoperative nausea and vomiting)    Urinary incontinence     Past Surgical History:  Procedure Laterality Date   cataract surgery     BIL   CORONARY ARTERY BYPASS GRAFT  03/21/2005   off pump LIMA-LAD   myomectomy  1981   reconstructive surgery  1945   lip    REPLACEMENT TOTAL HIP W/  RESURFACING IMPLANTS Right    ROTATOR CUFF REPAIR  1998   3 times   SHOULDER ARTHROSCOPY  2006   TOTAL ABDOMINAL HYSTERECTOMY  1991   TOTAL HIP ARTHROPLASTY Left 03/01/2016   Procedure: LEFT TOTAL HIP ARTHROPLASTY ANTERIOR APPROACH;  Surgeon: Paralee Cancel, MD;  Location: [facility];  Service: Orthopedics;  Laterality: Left;  Requests 70 mins   TOTAL HIP ARTHROPLASTY Right 07/27/2021   Procedure: TOTAL HIP ARTHROPLASTY ANTERIOR APPROACH;  Surgeon: Paralee Cancel, MD;  Location: [facility];  Service: Orthopedics;  Laterality: Right;   trach      Current Medications: Current Meds  Medication Sig   acetaminophen (TYLENOL) 650 MG CR tablet Take 1,300 mg by mouth in the morning and at bedtime.   celecoxib (CELEBREX) 200 MG capsule Take 200 mg by mouth at  bedtime.   chlorthalidone (HYGROTON) 25 MG tablet Take 0.5 tablets (12.5 mg total) by mouth daily.   Cholecalciferol (VITAMIN D3 PO) Take 2,000 Units by mouth daily with supper.   Clobetasol Prop Emollient Base (CLOBETASOL PROPIONATE E) 0.05 % emollient cream Apply 1 Application topically at bedtime.   COLLAGEN-VITAMIN C-BIOTIN PO Take 3 capsules by mouth daily with supper.   estradiol (ESTRACE) 0.1 MG/GM vaginal cream Place 0.5 g vaginally 2 (two) times a week. Place 0.5g nightly for two weeks then twice a week after   famotidine (PEPCID) 10 MG tablet Take 10 mg by mouth at bedtime.   Multiple Vitamin (MULTIVITAMIN WITH MINERALS) TABS tablet Take 1 tablet  by mouth daily with supper. [facility] [address] [employer]! Once Daily Women's 50+   rosuvastatin (CRESTOR) 40 MG tablet Take 1 tablet (40 mg total) by mouth daily. (Patient taking differently: Take 40 mg by mouth at bedtime.)   valsartan (DIOVAN) 320 MG tablet Take 1 tablet (320 mg total) by mouth daily. (Patient taking differently: Take 320 mg by mouth at bedtime.)   [DISCONTINUED] dextroamphetamine (DEXTROSTAT) 5 MG tablet Take 4 tablets (20 mg total) by mouth every morning.   [DISCONTINUED] FLUoxetine (PROZAC) 20 MG capsule Take 1 capsule (20 mg total) by mouth daily. (Patient taking differently: Take 20 mg by mouth at bedtime.)     Allergies:   Penicillins, Talwin [pentazocine], Bextra [valdecoxib], Codeine, Lactose intolerance (gi), Meperidine hcl, and Morphine   Social History   Tobacco Use   Smoking status: Never   Smokeless tobacco: Never  Vaping Use   Vaping Use: Never used  Substance Use Topics   Alcohol use: No   Drug use: No     Family History: The patient's family history includes Alcohol abuse in her maternal grandfather; Aneurysm in her mother; Arthritis in her maternal grandmother; Bipolar disorder in her mother and another family member; Hypertension in her mother and another family member; Kidney disease in her mother; Lung cancer in her father; Osteoporosis in her maternal grandmother; Stroke in her mother.  ROS:   Please see the history of present illness.    (+)Joint pain (hip surgery) All other systems reviewed and are negative.  EKGs/Labs/Other Studies Reviewed:    The following studies were reviewed today:  Echo: 05/01/2019 IMPRESSIONS     1. Left ventricular ejection fraction, by estimation, is 70 to 75%. The  left ventricle has hyperdynamic function. The left ventricle has no  regional wall motion abnormalities. Left ventricular diastolic parameters  are indeterminate.   2. Right ventricular systolic function is normal. The right ventricular  size is normal.  There is normal pulmonary artery systolic pressure.   3. The mitral valve is grossly normal. Trivial mitral valve  regurgitation. No evidence of mitral stenosis.   4. The aortic valve is tricuspid. Aortic valve regurgitation is not  visualized. Mild aortic valve sclerosis is present, with no evidence of  aortic valve stenosis.   5. The inferior vena cava is normal in size with greater than 50%  respiratory variability, suggesting right atrial pressure of 3 mmHg.   EKG:  The EKG is personally reviewed 11/30/2021: Sinus Bradycardia HR 59   Recent Labs: 07/09/2021: ALT 14 07/28/2021: BUN 22; Creatinine, Ser 1.01; Potassium 4.5; Sodium 140 10/06/2021: Hemoglobin 13.4; Platelets 209.0  Recent Lipid Panel    Component Value Date/Time   CHOL 150 07/09/2021 0849   TRIG 120 07/09/2021 0849   HDL 67 07/09/2021 0849   CHOLHDL 2.2 07/09/2021 0849  CHOLHDL 2 09/20/2016 1644   VLDL 30.0 09/20/2016 1644   LDLCALC 62 07/09/2021 0849    Physical Exam:    VS:  BP 122/66 (BP Location: Left Arm, Patient Position: Sitting, Cuff Size: Normal)   Pulse (!) 59   Ht 5' (1.524 m)   Wt 114 lb (51.7 kg)   LMP 01/10/1989 (Approximate)   SpO2 99%   BMI 22.26 kg/m     Wt Readings from Last 5 Encounters:  11/30/21 114 lb (51.7 kg)  11/05/21 111 lb (50.3 kg)  10/06/21 114 lb (51.7 kg)  09/15/21 112 lb (50.8 kg)  07/27/21 111 lb (50.3 kg)    Constitutional: No acute distress Eyes: sclera non-icteric, normal conjunctiva and lids ENMT: normal dentition, moist mucous membranes Cardiovascular: regular rhythm, normal rate,  1/6 early peaking systolic murmur. S1 and S2 normal. No jugular venous distention.  Respiratory: clear to auscultation bilaterally GI : normal bowel sounds, soft and nontender. No distention.   MSK: extremities warm, well perfused. No edema.  NEURO: grossly nonfocal exam, moves all extremities. PSYCH: alert and oriented x 3, normal mood and affect.   ASSESSMENT:    1.  Preoperative cardiovascular examination   2. Coronary artery disease due to lipid rich plaque   3. Hx of CABG   4. Essential hypertension   5. Hyperlipidemia with target LDL less than 70    PLAN:    Preoperative cardiovascular examination - Plan: EKG 12-Lead -Patient is anticipating bladder stimulator implant early next year.  Patient is optimized from a cardiovascular standpoint and no further testing is required if patient's health status does not change in the interim.  EKG grossly normal.  Coronary artery disease due to lipid rich plaque - Plan: EKG 12-Lead Hx of CABG - Plan: EKG 12-Lead -Stable ischemic heart disease.  Discussed indications for aspirin which she is currently not taking, would recommend aspirin 81 mg daily given prior revascularization. -Patient continues on rosuvastatin 40 mg daily, most recent lipid panel shows LDL 62, triglycerides 120, HDL 67.  Continue current dose  Essential hypertension - Plan: EKG 12-Lead -Blood pressure well controlled, continue chlorthalidone 12.5 mg daily and valsartan 320 mg daily  Hyperlipidemia with target LDL less than 70 -As above continue current dose of Crestor.  Follow up: 1 year  Total time of encounter: 30 minutes total time of encounter, including 20 minutes spent in face-to-face patient care on the date of this encounter. This time includes coordination of care and counseling regarding above mentioned problem list. Remainder of non-face-to-face time involved reviewing chart documents/testing relevant to the patient encounter and documentation in the medical record. I have independently reviewed documentation from referring provider.   Cherlynn Kaiser, MD, [place]     Shared Decision Making/Informed Consent:       Medication Adjustments/Labs and Tests Ordered: Current medicines are reviewed at length with the patient today.  Concerns regarding medicines are outlined above.   Orders Placed This  Encounter  Procedures   EKG 12-Lead    No orders of the defined types were placed in this encounter.   Patient Instructions  Medication Instructions:  No Changes In Medications at this time.  *If you need a refill on your cardiac medications before your next appointment, please call your pharmacy*  Follow-Up: At [facility], you and your health needs are our priority.  As part of our continuing mission to provide you with exceptional heart care, we have created designated Provider Care Teams.  These Care Teams include your primary Cardiologist (physician) and Advanced Practice Providers (APPs -  Physician Assistants and Nurse Practitioners) who all work together to provide you with the care you need, when you need it.  Your next appointment:   1 year(s)  The format for your next appointment:   In Person  Provider:   Elouise Munroe, MD             I,Coren O'Brien,acting as a scribe for Elouise Munroe, MD.,have documented all relevant documentation on the behalf of Elouise Munroe, MD,as directed by  Elouise Munroe, MD while in the presence of Elouise Munroe, MD.  I, Elouise Munroe, MD, have reviewed all documentation for the visit on 11/30/2021. The documentation on today's date of service for the exam, diagnosis, procedures, and orders are all accurate and complete.

## 2021-12-01 ENCOUNTER — Ambulatory Visit (INDEPENDENT_AMBULATORY_CARE_PROVIDER_SITE_OTHER): Payer: Medicare Other | Admitting: Psychiatry

## 2021-12-01 ENCOUNTER — Encounter: Payer: Self-pay | Admitting: Psychiatry

## 2021-12-01 DIAGNOSIS — F39 Unspecified mood [affective] disorder: Secondary | ICD-10-CM

## 2021-12-01 DIAGNOSIS — M79605 Pain in left leg: Secondary | ICD-10-CM | POA: Diagnosis not present

## 2021-12-01 DIAGNOSIS — I2583 Coronary atherosclerosis due to lipid rich plaque: Secondary | ICD-10-CM | POA: Diagnosis not present

## 2021-12-01 DIAGNOSIS — G4710 Hypersomnia, unspecified: Secondary | ICD-10-CM | POA: Diagnosis not present

## 2021-12-01 DIAGNOSIS — G8929 Other chronic pain: Secondary | ICD-10-CM | POA: Diagnosis not present

## 2021-12-01 DIAGNOSIS — I2510 Atherosclerotic heart disease of native coronary artery without angina pectoris: Secondary | ICD-10-CM

## 2021-12-01 DIAGNOSIS — M79604 Pain in right leg: Secondary | ICD-10-CM

## 2021-12-01 MED ORDER — DULOXETINE HCL 30 MG PO CPEP: ORAL_CAPSULE | ORAL | 0 refills | Status: DC

## 2021-12-01 NOTE — Telephone Encounter (Signed)
Called and spoke to patient and let her know that her medication was refilled by [facility]. Nothing further needed

## 2021-12-01 NOTE — Progress Notes (Signed)
Brandy [name] [id number] 11-24-1950 71 y.o.  Subjective:   Patient ID:  Brandy [name] is a 71 y.o. (DOB [birth year]) female.  Chief Complaint:  Chief Complaint  Patient presents with   Follow-up   Stress    Medication Refill Associated symptoms include arthralgias and fatigue. Pertinent negatives include no chest pain or weakness.   Brandy [name] presents to the office today for follow-up of mood and above.  seen  10/2018.  Meds were not changed.  She remained on fluoxetine 20 mg daily.  10/21/19 appt with the following noted: Vaccinated. Overall pretty good.  Minor depressive episodes which are brief.  Managing the stress.  Nothing extraordinary.  CC is sleepiness.  Saw neurologist recently and no med change.  Has to nap.  Tolerance to the stimulant.  No mood swings. Physical activity helps. Hyper somnolence a big problem and sleep doc rx dextroamphetamin.  Has therapeutic naps.   But it still helps.  Didn't make her hyperverbal and hyperactive like in the past.  Has been very careful with the dosage. Overall better sleep cycle. No mania since here.  Patient reports stable mood and denies irritable moods. No extremes of depression.   Patient denies any recent difficulty with anxiety.   Denies appetite disturbance.  Patient reports that energy and motivation have been better.  Patient denies any difficulty with concentration.  Patient denies any suicidal ideation. No history of cataplexy. No new concerns.   Plan:  disc risk manic type sx with stimulant but understand it's been needed for alertness. Plan mood has been stable on fluoxetine  20 mgand she does not want to change the dose or add any other medicine.  10/19/2020 appointment with the following noted: Had another nocturnal in lab sleep study.  No OSA, minimal PLMS.  Pain interferes with sleep at time in hip.  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 No matter how she sleeps at night has excessive daytime sleepiness.  If  busy can function.  Other wise 2 hour naps.  Exercise rowing 4 times weekly. Not tried [employer]. Injections from Dr. Nelva Bush helped pain. Slump in mood around labor day.  Probably circumstantial and not long lasting.  No manic. Patient reports stable mood and denies depressed or irritable moods.  Patient denies any recent difficulty with anxiety.  Patient denies difficulty with sleep initiation or maintenance. Denies appetite disturbance.  Patient reports that energy and motivation have been good.  Patient denies any difficulty with concentration.  Patient denies any suicidal ideation. Satisfied with fluoxetine.  12/01/21 appt noted: Only psych med is fluoxetine 20 daily. Circumstantially not as good.  Hip pain limited activity and surgery in July and long recovery is hard. Lives alone and not able to be as active. Struggle a lot with narcolepsy even on dextroamphetamine.  Sleeping too much.  Can't afford Sunosi.  Seeing Dr. Halford Chessman. Joined [employer].  Good friends. More aerobic activity would make her feel better. Took duloxetine in past and helped FM.  Asked about it. Stimulants rev her up and make her chatty.  Retired 2013.  Works on being active.  Exercises.  Past psych meds:  Fluoxetine,  Lamotrigine 600, Latuda, lithium, duloxetine, Abilify 5, Depakote 1000,  Seroquel sedation, Wellbutrin, Provigil, Celexa 40, Cerefolin NAC,  Trintellix, Trintellix, Viibryd 40, duloxetine, various stimulants,  Restoril, rozerem not not helpful  Review of Systems:  Review of Systems  Constitutional:  Positive for fatigue.  Cardiovascular:  Negative for chest pain and palpitations.  Musculoskeletal:  Positive  for arthralgias.  Neurological:  Negative for tremors and weakness.  Psychiatric/Behavioral:  Positive for sleep disturbance. Negative for agitation, behavioral problems, confusion, decreased concentration, dysphoric mood, hallucinations, self-injury and suicidal ideas. The patient is not nervous/anxious and  is not hyperactive.     Medications: I have reviewed the patient's current medications.  Current Outpatient Medications  Medication Sig Dispense Refill   acetaminophen (TYLENOL) 650 MG CR tablet Take 1,300 mg by mouth in the morning and at bedtime.     celecoxib (CELEBREX) 200 MG capsule Take 200 mg by mouth at bedtime.     chlorthalidone (HYGROTON) 25 MG tablet Take 0.5 tablets (12.5 mg total) by mouth daily. 90 tablet 3   Cholecalciferol (VITAMIN D3 PO) Take 2,000 Units by mouth daily with supper.     Clobetasol Prop Emollient Base (CLOBETASOL PROPIONATE E) 0.05 % emollient cream Apply 1 Application topically at bedtime. 30 g 11   COLLAGEN-VITAMIN C-BIOTIN PO Take 3 capsules by mouth daily with supper.     dextroamphetamine (DEXTROSTAT) 5 MG tablet Take 4 tablets (20 mg total) by mouth every morning. 120 tablet 0   DULoxetine (CYMBALTA) 30 MG capsule 1 daily for 1 week then 2 daily 30 capsule 0   estradiol (ESTRACE) 0.1 MG/GM vaginal cream Place 0.5 g vaginally 2 (two) times a week. Place 0.5g nightly for two weeks then twice a week after 30 g 11   famotidine (PEPCID) 10 MG tablet Take 10 mg by mouth at bedtime.     Multiple Vitamin (MULTIVITAMIN WITH MINERALS) TABS tablet Take 1 tablet by mouth daily with supper. Nature's Way Alive! Once Daily Women's 50+     valsartan (DIOVAN) 320 MG tablet Take 1 tablet (320 mg total) by mouth daily. (Patient taking differently: Take 320 mg by mouth at bedtime.) 90 tablet 3   methocarbamol (ROBAXIN) 500 MG tablet Take 1 tablet (500 mg total) by mouth every 6 (six) hours as needed for muscle spasms. 40 tablet 0   rosuvastatin (CRESTOR) 40 MG tablet Take 1 tablet (40 mg total) by mouth daily. (Patient taking differently: Take 40 mg by mouth at bedtime.) 90 tablet 3   No current facility-administered medications for this visit.    Medication Side Effects: None  Allergies:  Allergies  Allergen Reactions   Penicillins Anaphylaxis    Has patient had a  PCN reaction causing immediate rash, facial/tongue/throat swelling, SOB or lightheadedness with hypotension: Yes Has patient had a PCN reaction causing severe rash involving mucus membranes or skin necrosis: No Has patient had a PCN reaction that required hospitalization Yes Has patient had a PCN reaction occurring within the last 10 years: No If all of the above answers are "NO", then may proceed with Cephalosporin use.   Talwin [Pentazocine] Other (See Comments)    Uncontrolled shaking/tremors   Bextra [Valdecoxib] Hives   Codeine Nausea And Vomiting   Lactose Intolerance (Gi) Diarrhea   Meperidine Hcl Nausea And Vomiting   Morphine Nausea And Vomiting    Past Medical History:  Diagnosis Date   Arthritis    CAD (coronary artery disease) 2007   CABG X 1   COVID 03/04/2021   Depression    controlled   Fibromyalgia    GERD (gastroesophageal reflux disease)    Hay fever    Headache    Hx of migraines   Heart murmur    Hyperlipidemia    Hypertension    Idiopathic hypersomnia    Mitral valve prolapse    Narcolepsy  PONV (postoperative nausea and vomiting)    Urinary incontinence     Family History  Problem Relation Age of Onset   Stroke Mother        died 68   Aneurysm Mother    Hypertension Mother    Kidney disease Mother    Bipolar disorder Mother    Lung cancer Father        died 73   Bipolar disorder Other    Hypertension Other    Osteoporosis Maternal Grandmother    Arthritis Maternal Grandmother    Alcohol abuse Maternal Grandfather     Social History   Socioeconomic History   Marital status: Divorced    Spouse name: Not on file   Number of children: 0   Years of education: Not on file   Highest education level: Not on file  Occupational History   Occupation: retired  Tobacco Use   Smoking status: Never   Smokeless tobacco: Never  Vaping Use   Vaping Use: Never used  Substance and Sexual Activity   Alcohol use: No   Drug use: No   Sexual  activity: Not Currently  Other Topics Concern   Not on file  Social History Narrative   Not on file   Social Determinants of Health   Financial Resource Strain: Low Risk  (09/15/2021)   Overall Financial Resource Strain (CARDIA)    Difficulty of Paying Living Expenses: Not hard at all  Food Insecurity: No Food Insecurity (09/15/2021)   Hunger Vital Sign    Worried About Running Out of Food in the Last Year: Never true    Ran Out of Food in the Last Year: Never true  Transportation Needs: No Transportation Needs (09/15/2021)   PRAPARE - Hydrologist (Medical): No    Lack of Transportation (Non-Medical): No  Physical Activity: Insufficiently Active (09/15/2021)   Exercise Vital Sign    Days of Exercise per Week: 3 days    Minutes of Exercise per Session: 30 min  Stress: No Stress Concern Present (09/15/2021)   [place]    Feeling of Stress : Not at all  Social Connections: Moderately Isolated (09/15/2021)   Social Connection and Isolation Panel [NHANES]    Frequency of Communication with Friends and Family: More than three times a week    Frequency of Social Gatherings with Friends and Family: More than three times a week    Attends Religious Services: More than 4 times per year    Active Member of [employer] or Organizations: No    Attends Archivist Meetings: Never    Marital Status: Divorced  Human resources officer Violence: Not At Risk (09/15/2021)   Humiliation, Afraid, Rape, and Kick questionnaire    Fear of Current or Ex-Partner: No    Emotionally Abused: No    Physically Abused: No    Sexually Abused: No    Past Medical History, Surgical history, Social history, and Family history were reviewed and updated as appropriate.   Please see review of systems for further details on the patient's review from today.   Objective:   Physical Exam:  LMP 01/10/1989 (Approximate)   Physical  Exam Constitutional:      General: She is not in acute distress.    Appearance: She is well-developed.  Musculoskeletal:        General: No deformity.  Neurological:     Mental Status: She is alert and oriented to person,  place, and time.     Motor: No tremor.     Coordination: Coordination normal.     Gait: Gait normal.  Psychiatric:        Attention and Perception: Attention normal. She is attentive.        Mood and Affect: Mood normal. Mood is not anxious or depressed. Affect is not labile, blunt, angry or inappropriate.        Speech: Speech normal. Speech is not rapid and pressured.        Behavior: Behavior normal.        Thought Content: Thought content normal. Thought content is not delusional. Thought content does not include homicidal or suicidal ideation. Thought content does not include homicidal or suicidal plan.        Cognition and Memory: Cognition normal.        Judgment: Judgment normal.     Comments: Insight is good. No hypomania.   Some depression     Lab Review:     Component Value Date/Time   NA 140 07/28/2021 0313   NA 141 07/09/2021 0849   K 4.5 07/28/2021 0313   CL 106 07/28/2021 0313   CO2 25 07/28/2021 0313   GLUCOSE 140 (H) 07/28/2021 0313   BUN 22 07/28/2021 0313   BUN 32 (H) 07/09/2021 0849   CREATININE 1.01 (H) 07/28/2021 0313   CREATININE 0.70 08/11/2015 1155   CALCIUM 9.0 07/28/2021 0313   PROT 6.7 07/09/2021 0849   ALBUMIN 4.8 (H) 07/09/2021 0849   AST 18 07/09/2021 0849   ALT 14 07/09/2021 0849   ALKPHOS 113 07/09/2021 0849   BILITOT 0.6 07/09/2021 0849   GFRNONAA 59 (L) 07/28/2021 0313   GFRAA 81 11/26/2019 1002       Component Value Date/Time   WBC 7.1 10/06/2021 1100   RBC 4.26 10/06/2021 1100   HGB 13.4 10/06/2021 1100   HGB 13.7 07/09/2021 0849   HCT 39.5 10/06/2021 1100   HCT 40.5 07/09/2021 0849   PLT 209.0 10/06/2021 1100   PLT 284 07/09/2021 0849   MCV 92.6 10/06/2021 1100   MCV 89 07/09/2021 0849   MCH 30.2  07/28/2021 0313   MCHC 34.0 10/06/2021 1100   RDW 13.1 10/06/2021 1100   RDW 12.4 07/09/2021 0849   LYMPHSABS 1.9 09/20/2016 1644   MONOABS 0.5 09/20/2016 1644   EOSABS 0.0 09/20/2016 1644   BASOSABS 0.1 09/20/2016 1644    No results found for: "POCLITH", "LITHIUM"   No results found for: "PHENYTOIN", "PHENOBARB", "VALPROATE", "CBMZ"   .res Assessment: Plan:    Brandy [name] was seen today for follow-up and stress.  Diagnoses and all orders for this visit:  Episodic mood disorder (HCC) -     DULoxetine (CYMBALTA) 30 MG capsule; 1 daily for 1 week then 2 daily  Hypersomnolence disorder  Chronic pain of both lower extremities -     DULoxetine (CYMBALTA) 30 MG capsule; 1 daily for 1 week then 2 daily   Greater than 50% of 30 min face to face time with patient was spent on counseling and coordination of care. We discussed stable for some time since retirment. No hypomania since here.  Have had extensive discussions in the past about whether or not she truly bipolar.  She is done well off mood stabilizers the last several years.  Her hypomania in the past may have been more connected to medication induced than true bipolar.  The longer she goes without hypomania the less likely she is truly bipolar.  She thinks prior stressful environment and lack of adequate sleep with higher dose stimulant made her look bipolar.   Reminded her about the signs and symptoms of mania and hypomania.  She is aware. She's done well off mood stabilizer still for years.  Can't afford Sunosi.  Seeing Dr. Halford Chessman. Disc SE and stimulants.  Disc DDI  No indication to change fluoxetine 20 mg daily. But option switch back to duloxetine to see if it would help pain bc helped FM sx in the past.  Value exercise for mood.  Supportive therapy dealing with aging issues in view of exercise and activity being the way she has managed mood.  This appt was 30 mins.  FU 13 mos  [name], MD, DFAPA   Please see After  Visit Summary for patient specific instructions.  Future Appointments  Date Time Provider [place]  01/06/2022 10:00 AM Haydee Salter, MD [facility]  01/14/2022  9:20 AM Jaquita Folds, MD [facility]  03/08/2022  9:20 AM Jaquita Folds, MD [facility]  09/27/2022  8:15 AM [facility]    No orders of the defined types were placed in this encounter.     -------------------------------

## 2021-12-02 NOTE — Telephone Encounter (Signed)
Refill sent by Eric Form

## 2021-12-07 DIAGNOSIS — H35033 Hypertensive retinopathy, bilateral: Secondary | ICD-10-CM | POA: Diagnosis not present

## 2021-12-07 DIAGNOSIS — H11153 Pinguecula, bilateral: Secondary | ICD-10-CM | POA: Diagnosis not present

## 2021-12-07 DIAGNOSIS — H40013 Open angle with borderline findings, low risk, bilateral: Secondary | ICD-10-CM | POA: Diagnosis not present

## 2021-12-07 DIAGNOSIS — H31092 Other chorioretinal scars, left eye: Secondary | ICD-10-CM | POA: Diagnosis not present

## 2021-12-07 LAB — HM DIABETES EYE EXAM

## 2021-12-08 ENCOUNTER — Encounter: Payer: Self-pay | Admitting: Family Medicine

## 2021-12-30 ENCOUNTER — Encounter: Payer: Self-pay | Admitting: Family Medicine

## 2021-12-30 ENCOUNTER — Encounter (HOSPITAL_BASED_OUTPATIENT_CLINIC_OR_DEPARTMENT_OTHER): Payer: Self-pay | Admitting: Pulmonary Disease

## 2021-12-31 NOTE — Telephone Encounter (Signed)
Patient called office and needs medication. Patients messages are going to the sleep center instead of pulmonary. Please advise

## 2022-01-04 MED ORDER — DEXTROAMPHETAMINE SULFATE 5 MG PO TABS: 20.0000 mg | ORAL_TABLET | Freq: Every day | ORAL | 0 refills | Status: DC

## 2022-01-04 NOTE — Telephone Encounter (Signed)
Refill sent.

## 2022-01-06 ENCOUNTER — Encounter: Payer: Self-pay | Admitting: Family Medicine

## 2022-01-06 ENCOUNTER — Ambulatory Visit (INDEPENDENT_AMBULATORY_CARE_PROVIDER_SITE_OTHER): Payer: Medicare Other | Admitting: Family Medicine

## 2022-01-06 VITALS — BP 120/66 | HR 78 | Temp 97.9°F | Ht 60.0 in | Wt 115.2 lb

## 2022-01-06 DIAGNOSIS — N958 Other specified menopausal and perimenopausal disorders: Secondary | ICD-10-CM | POA: Diagnosis not present

## 2022-01-06 DIAGNOSIS — E7849 Other hyperlipidemia: Secondary | ICD-10-CM

## 2022-01-06 DIAGNOSIS — I10 Essential (primary) hypertension: Secondary | ICD-10-CM | POA: Diagnosis not present

## 2022-01-06 DIAGNOSIS — N904 Leukoplakia of vulva: Secondary | ICD-10-CM | POA: Insufficient documentation

## 2022-01-06 NOTE — Progress Notes (Signed)
[facility] [address] [place] [address] Dept: [phone] Dept Fax: [phone]  Chronic Care Office Visit  Subjective:    Patient ID: Brandy [name], female    DOB: 03/05/1950, 71 y.o..   MRN: [id number]  Chief Complaint  Patient presents with   Follow-up    3 month f/u.  No concerns.  Not fasting today.      History of Present Illness:  Patient is in today for reassessment of chronic medical issues.  Ms. Mohs has a history of having had a single-vessel CABG. She has a history of hypertension and is managed on valsartan 320 mg daily and chlorthalidone 25 mg daily. She has hyperlipidemia and is managed on rosuvastatin 40 mg daily.   Ms. Wyer has a history of urinary urgency and some occasional bowel leakage. The bowel issues have improved with increased fiber use, but not completely resolved. She has started to see Dr. Valentino Hue (urogynecology). She is scheduled to have an Axionics interstim implant placed in a 2-step procedure in Jan. and Feb. Dr. Wannetta Sender did start her on a vaginal estrogen cream for atrophic vaginitis and also a steroid cream for managing lichen sclerosus.  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   Laryngospasms 04/22/2020   Numbness and tingling in both hands 06/20/2018   Rosacea 08/24/2017   S/P left THA, AA 03/01/2016   S/P hip replacement, left 03/01/2016   Circadian rhythm sleep disorder, irregular sleep wake type  10/03/2013   Hyperlipidemia    Narcolepsy without cataplexy    CAD (coronary artery disease)    Essential hypertension    Past Surgical History:  Procedure Laterality Date   cataract surgery     BIL   CORONARY ARTERY BYPASS GRAFT  03/21/2005   off pump LIMA-LAD   myomectomy  1981   reconstructive surgery  1945   lip    REPLACEMENT TOTAL HIP W/  RESURFACING IMPLANTS Right    ROTATOR CUFF REPAIR  1998   3 times   SHOULDER ARTHROSCOPY  2006   TOTAL ABDOMINAL HYSTERECTOMY  1991   TOTAL HIP ARTHROPLASTY Left 03/01/2016   Procedure: LEFT TOTAL HIP ARTHROPLASTY ANTERIOR APPROACH;  Surgeon: Paralee Cancel, MD;  Location: [facility];  Service: Orthopedics;  Laterality: Left;  Requests 70 mins   TOTAL HIP ARTHROPLASTY Right 07/27/2021   Procedure: TOTAL HIP ARTHROPLASTY ANTERIOR APPROACH;  Surgeon: Paralee Cancel, MD;  Location: [facility];  Service: Orthopedics;  Laterality: Right;   trach     Family History  Problem Relation Age of Onset   Stroke Mother        died 35   Aneurysm Mother    Hypertension Mother    Kidney disease Mother    Bipolar disorder Mother    Lung cancer Father        died 65   Bipolar disorder Other    Hypertension Other    Osteoporosis Maternal Grandmother    Arthritis Maternal Grandmother    Alcohol abuse Maternal Grandfather    Outpatient Medications Prior to Visit  Medication Sig Dispense Refill   acetaminophen (TYLENOL) 650 MG CR tablet Take 1,300 mg by mouth in the morning and at bedtime.     celecoxib (CELEBREX) 200 MG capsule Take 200 mg by mouth at bedtime.     chlorthalidone (HYGROTON) 25 MG tablet Take 0.5 tablets (12.5 mg total) by mouth daily. 90 tablet 3   Cholecalciferol (VITAMIN D3 PO) Take 2,000 Units by mouth daily with supper.     clindamycin (CLEOCIN) 300 MG capsule 3 po 1 hr prior to dental procedure     Clobetasol Prop Emollient Base (CLOBETASOL PROPIONATE E) 0.05 % emollient cream Apply 1 Application topically at bedtime. 30 g 11    COLLAGEN-VITAMIN C-BIOTIN PO Take 3 capsules by mouth daily with supper.     dextroamphetamine (DEXTROSTAT) 5 MG tablet Take 4 tablets (20 mg total) by mouth daily. 120 tablet 0   DULoxetine (CYMBALTA) 30 MG capsule 1 daily for 1 week then 2 daily 30 capsule 0   estradiol (ESTRACE) 0.1 MG/GM vaginal cream Place 0.5 g vaginally 2 (two) times a week. Place 0.5g nightly for two weeks then twice a week after 30 g 11   famotidine (PEPCID) 10 MG tablet Take 10 mg by mouth at bedtime.     Multiple Vitamin (MULTIVITAMIN WITH MINERALS) TABS tablet Take 1 tablet by mouth daily with supper. Nature's Way Alive! Once Daily Women's 50+     rosuvastatin (CRESTOR) 40 MG tablet Take 1 tablet (40 mg total) by mouth daily. (Patient taking differently: Take 40 mg by mouth at bedtime.) 90 tablet 3   valsartan (DIOVAN) 320 MG tablet Take 1 tablet (320 mg total) by mouth daily. (Patient taking differently: Take 320 mg by mouth at bedtime.) 90 tablet 3   No facility-administered medications prior to visit.   Allergies  Allergen Reactions   Penicillins Anaphylaxis    Has patient had a PCN reaction causing immediate rash, facial/tongue/throat swelling, SOB or lightheadedness with hypotension: Yes Has patient had a PCN reaction causing severe rash involving mucus membranes or skin necrosis: No Has patient had a PCN reaction that required hospitalization Yes Has patient had a PCN reaction occurring within the last 10 years: No If all of the above answers are "NO", then may proceed with Cephalosporin use.   Talwin [Pentazocine] Other (See Comments)    Uncontrolled shaking/tremors   Bextra [Valdecoxib] Hives   Codeine Nausea And Vomiting   Lactose Intolerance (Gi) Diarrhea   Meperidine Hcl Nausea And Vomiting   Morphine Nausea And Vomiting    Objective:   Today's Vitals   01/06/22 1008  BP: 120/66  Pulse: 78  Temp: 97.9 F (36.6 C)  TempSrc: Temporal  SpO2: 98%  Weight: 115 lb 3.2 oz (52.3 kg)  Height: 5'  (1.524 m)   Body mass index is 22.5 kg/m.   General: Well developed, well nourished. No acute distress. Psych: Alert and oriented. Normal mood and affect.  There are no preventive care reminders to display for this patient.    Assessment & Plan:   1. Essential hypertension Blood pressure is at goal. Continue valsartan 320 mg daily and chlorthalidone 25 mg daily.   2. Other hyperlipidemia Continue rosuvastatin 40 mg daily.  3. Genitourinary syndrome of menopause We reviewed safety of vaginal estrogen compared to oral estrogens. She should continue this. She will follow-up with Dr. schroeder related to her planned Axionics device.  Return in about 3 months (around 04/07/2022) for Reassessment.   Haydee Salter, MD

## 2022-01-11 ENCOUNTER — Telehealth: Payer: Self-pay | Admitting: Psychiatry

## 2022-01-11 ENCOUNTER — Other Ambulatory Visit: Payer: Self-pay

## 2022-01-11 DIAGNOSIS — G8929 Other chronic pain: Secondary | ICD-10-CM

## 2022-01-11 DIAGNOSIS — F39 Unspecified mood [affective] disorder: Secondary | ICD-10-CM

## 2022-01-11 MED ORDER — DULOXETINE HCL 60 MG PO CPEP: 60.0000 mg | ORAL_CAPSULE | Freq: Every day | ORAL | 1 refills | Status: DC

## 2022-01-11 NOTE — Telephone Encounter (Signed)
Rx sent 

## 2022-01-11 NOTE — Telephone Encounter (Signed)
Patient lvm at 11:46 stating that she just realized she did not have any refills left for her generic Cymbalta '30mg'$ . She would like prescription sent to [facility] [place], [place] Ph: [phone] Appt 2/22

## 2022-01-14 ENCOUNTER — Ambulatory Visit (INDEPENDENT_AMBULATORY_CARE_PROVIDER_SITE_OTHER): Payer: Medicare Other | Admitting: Obstetrics and Gynecology

## 2022-01-14 VITALS — BP 128/63 | HR 74

## 2022-01-14 DIAGNOSIS — M5126 Other intervertebral disc displacement, lumbar region: Secondary | ICD-10-CM | POA: Insufficient documentation

## 2022-01-14 DIAGNOSIS — L900 Lichen sclerosus et atrophicus: Secondary | ICD-10-CM

## 2022-01-14 DIAGNOSIS — N3281 Overactive bladder: Secondary | ICD-10-CM

## 2022-01-14 MED ORDER — OXYCODONE HCL 5 MG PO TABS: 5.0000 mg | ORAL_TABLET | ORAL | 0 refills | Status: DC | PRN

## 2022-01-14 MED ORDER — ACETAMINOPHEN 500 MG PO TABS: 500.0000 mg | ORAL_TABLET | Freq: Four times a day (QID) | ORAL | 0 refills | Status: DC | PRN

## 2022-01-14 NOTE — Progress Notes (Unsigned)
Urogynecology Pre-Operative visit  Subjective Chief Complaint: Brandy [name] presents for a preoperative encounter.   History of Present Illness: Brandy [name] is a 72 y.o. female who presents for preoperative visit.  She is scheduled to undergo Axonics Stage 1 Sacral Neuromodulation on 01/31/22 and Stage II SNM on 02/14/22.  Her symptoms include urinary urgency and incontinence, and she was was found to have Stage {Roman # I-V:19040} anterior, Stage {Roman # I-V:19040} posterior, Stage {Roman # I-V:19040} apical prolapse.   She also has lichen sclerosus. Was using the clobetasol once a day then recently increased it to twice a day.   Past Medical History:  Diagnosis Date   Arthritis    CAD (coronary artery disease) 2007   CABG X 1   COVID 03/04/2021   Depression    controlled   Fibromyalgia    GERD (gastroesophageal reflux disease)    Hay fever    Headache    Hx of migraines   Heart murmur    Hyperlipidemia    Hypertension    Idiopathic hypersomnia    Mitral valve prolapse    Narcolepsy    PONV (postoperative nausea and vomiting)    Urinary incontinence      Past Surgical History:  Procedure Laterality Date   cataract surgery     BIL   CORONARY ARTERY BYPASS GRAFT  03/21/2005   off pump LIMA-LAD   myomectomy  1981   reconstructive surgery  1945   lip    REPLACEMENT TOTAL HIP W/  RESURFACING IMPLANTS Right    ROTATOR CUFF REPAIR  1998   3 times   SHOULDER ARTHROSCOPY  2006   TOTAL ABDOMINAL HYSTERECTOMY  1991   TOTAL HIP ARTHROPLASTY Left 03/01/2016   Procedure: LEFT TOTAL HIP ARTHROPLASTY ANTERIOR APPROACH;  Surgeon: Paralee Cancel, MD;  Location: [facility];  Service: Orthopedics;  Laterality: Left;  Requests 70 mins   TOTAL HIP ARTHROPLASTY Right 07/27/2021   Procedure: TOTAL HIP ARTHROPLASTY ANTERIOR APPROACH;  Surgeon: Paralee Cancel, MD;  Location: [facility];  Service: Orthopedics;  Laterality: Right;   trach      is allergic to penicillins,  talwin [pentazocine], bextra [valdecoxib], codeine, lactose intolerance (gi), meperidine hcl, and morphine.   Family History  Problem Relation Age of Onset   Stroke Mother        died 65   Aneurysm Mother    Hypertension Mother    Kidney disease Mother    Bipolar disorder Mother    Lung cancer Father        died 55   Bipolar disorder Other    Hypertension Other    Osteoporosis Maternal Grandmother    Arthritis Maternal Grandmother    Alcohol abuse Maternal Grandfather     Social History   Tobacco Use   Smoking status: Never   Smokeless tobacco: Never  Vaping Use   Vaping Use: Never used  Substance Use Topics   Alcohol use: No   Drug use: No     Review of Systems was negative for a full 10 system review except as noted in the History of Present Illness.   Current Outpatient Medications:    acetaminophen (TYLENOL) 650 MG CR tablet, Take 1,300 mg by mouth in the morning and at bedtime., Disp: , Rfl:    celecoxib (CELEBREX) 200 MG capsule, Take 200 mg by mouth at bedtime., Disp: , Rfl:    chlorthalidone (HYGROTON) 25 MG tablet, Take 0.5 tablets (12.5 mg total) by mouth daily.,  Disp: 90 tablet, Rfl: 3   Cholecalciferol (VITAMIN D3 PO), Take 2,000 Units by mouth daily with supper., Disp: , Rfl:    clindamycin (CLEOCIN) 300 MG capsule, 3 po 1 hr prior to dental procedure, Disp: , Rfl:    Clobetasol Prop Emollient Base (CLOBETASOL PROPIONATE E) 0.05 % emollient cream, Apply 1 Application topically at bedtime., Disp: 30 g, Rfl: 11   COLLAGEN-VITAMIN C-BIOTIN PO, Take 3 capsules by mouth daily with supper., Disp: , Rfl:    dextroamphetamine (DEXTROSTAT) 5 MG tablet, Take 4 tablets (20 mg total) by mouth daily., Disp: 120 tablet, Rfl: 0   DULoxetine (CYMBALTA) 60 MG capsule, Take 1 capsule (60 mg total) by mouth daily., Disp: 30 capsule, Rfl: 1   estradiol (ESTRACE) 0.1 MG/GM vaginal cream, Place 0.5 g vaginally 2 (two) times a week. Place 0.5g nightly for two weeks then twice a  week after, Disp: 30 g, Rfl: 11   famotidine (PEPCID) 10 MG tablet, Take 10 mg by mouth at bedtime., Disp: , Rfl:    Multiple Vitamin (MULTIVITAMIN WITH MINERALS) TABS tablet, Take 1 tablet by mouth daily with supper. [employer] [address] [employer]! Once Daily Women's 50+, Disp: , Rfl:    rosuvastatin (CRESTOR) 40 MG tablet, Take 1 tablet (40 mg total) by mouth daily. (Patient taking differently: Take 40 mg by mouth at bedtime.), Disp: 90 tablet, Rfl: 3   valsartan (DIOVAN) 320 MG tablet, Take 1 tablet (320 mg total) by mouth daily. (Patient taking differently: Take 320 mg by mouth at bedtime.), Disp: 90 tablet, Rfl: 3   Objective There were no vitals filed for this visit.  Gen: NAD CV: S1 S2 RRR Lungs: Clear to auscultation bilaterally Abd: soft, nontender   Previous Pelvic Exam showed: ***    Assessment/ Plan  Assessment: The patient is a 72 y.o. year old scheduled to undergo ***. {desc; verbal/written:16408} consent was obtained for these procedures.  Plan: General Surgical Consent: The patient has previously been counseled on alternative treatments, and the decision by the patient and provider was to proceed with the procedure listed above.  For all procedures, there are risks of bleeding, infection, damage to surrounding organs including but not limited to bowel, bladder, blood vessels, ureters and nerves, and need for further surgery if an injury were to occur. These risks are all low with minimally invasive surgery.   There are risks of numbness and weakness at any body site or buttock/rectal pain.  It is possible that baseline pain can be worsened by surgery, either with or without mesh. If surgery is vaginal, there is also a low risk of possible conversion to laparoscopy or open abdominal incision where indicated. Very rare risks include blood transfusion, blood clot, heart attack, pneumonia, or death.   There is also a risk of short-term postoperative urinary retention with need to use  a catheter. About half of patients need to go home from surgery with a catheter, which is then later removed in the office. The risk of long-term need for a catheter is very low. There is also a risk of worsening of overactive bladder.   ***Sling: The effectiveness of a midurethral vaginal mesh sling is approximately 85%, and thus, there will be times when you may leak urine after surgery, especially if your bladder is full or if you have a strong cough. There is a balance between making the sling tight enough to treat your leakage but not too tight so that you have long-term difficulty emptying your bladder. A mesh  sling will not directly treat overactive bladder/urge incontinence and may worsen it.  There is an FDA safety notification on vaginal mesh procedures for prolapse but NOT mesh slings. We have extensive experience and training with mesh placement and we have close postoperative follow up to identify any potential complications from mesh. It is important to realize that this mesh is a permanent implant that cannot be easily removed. There are rare risks of mesh exposure (2-4%), pain with intercourse (0-7%), and infection (<1%). The risk of mesh exposure if more likely in a woman with risks for poor healing (prior radiation, poorly controlled diabetes, or immunocompromised). The risk of new or worsened chronic pain after mesh implant is more common in women with baseline chronic pain and/or poorly controlled anxiety or depression. Approximately 2-4% of patients will experience longer-term post-operative voiding dysfunction that may require surgical revision of the sling. We also reviewed that postoperatively, her stream may not be as strong as before surgery.   *** Prolapse (with or without mesh): Risk factors for surgical failure  include things that put pressure on your pelvis and the surgical repair, including obesity, chronic cough, and heavy lifting or straining (including lifting children or  adults, straining on the toilet, or lifting heavy objects such as furniture or anything weighing >25 lbs. Risks of recurrence is 20-30% with vaginal native tissue repair and a less than 10% with sacrocolpopexy with mesh.    ***Sacrocolpopexy: Mesh implants may provide more prolapse support, but do have some unique risks to consider. It is important to understand that mesh is permanent and cannot be easily removed. Risks of abdominal sacrocolpopexy mesh include mesh exposure (~3-6%), painful intercourse (recent studies show lower rates after surgery compared to before, with ~5-8% risk of new onset), and very rare risks of bowel or bladder injury or infection (<1%). The risk of mesh exposure is more likely in a woman with risks for poor healing (prior radiation, poorly controlled diabetes, or immunocompromised). The risk of new or worsened chronic pain after mesh implant is more common in women with baseline chronic pain and/or poorly controlled anxiety or depression. There is an FDA safety notification on vaginal mesh procedures for prolapse but NOT abdominal mesh procedures and therefore does not apply to your surgery. We have extensive experience and training with mesh placement and we have close postoperative follow up to identify any potential complications from mesh.    We discussed consent for blood products. Risks for blood transfusion include allergic reactions, other reactions that can affect different body organs and managed accordingly, transmission of infectious diseases such as HIV or Hepatitis. However, the blood is screened. Patient consents for blood products.***  Pre-operative instructions:  She was instructed to not take Aspirin/NSAIDs x 7days prior to surgery. She may continue her '81mg'$  ASA.*** Antibiotic prophylaxis was ordered as indicated.  Catheter use: Patient will go home with foley if needed after post-operative voiding trial.  Post-operative instructions:  She was provided with  specific post-operative instructions, including precautions and signs/symptoms for which we would recommend contacting us, in addition to daytime and after-hours contact phone numbers. This was provided on a handout.   Post-operative medications: ***Prescriptions for motrin, tylenol, miralax, and oxycodone were sent to her pharmacy. Discussed using ibuprofen and tylenol on a schedule to limit use of narcotics.   Laboratory testing:  We will check labs: ***. Day of surgery UPT***  Preoperative clearance:  She {does/does not:19886} require surgical clearance.    Post-operative follow-up:  A post-operative appointment  will be made for 6 weeks from the date of surgery. If she needs a post-operative nurse visit for a voiding trial, that will be set up after she leaves the hospital.    Patient will call the clinic or use MyChart should anything change or any new issues arise.   Jaquita Folds, MD   ***OR orders

## 2022-01-16 ENCOUNTER — Encounter: Payer: Self-pay | Admitting: Obstetrics and Gynecology

## 2022-01-20 ENCOUNTER — Encounter: Payer: Self-pay | Admitting: Obstetrics and Gynecology

## 2022-01-20 MED ORDER — SULFAMETHOXAZOLE-TRIMETHOPRIM 800-160 MG PO TABS: 1.0000 | ORAL_TABLET | Freq: Every day | ORAL | 0 refills | Status: AC

## 2022-01-20 NOTE — H&P (Signed)
[facility] Pre-Operative H&P  Subjective Chief Complaint: KAROLYNA BIANCHINI presents for a preoperative encounter.   History of Present Illness: Brandy [name] is a 72 y.o. female who presents for preoperative visit.  She is scheduled to undergo Axonics Stage 1 Sacral Neuromodulation on 02/14/22 and Stage II SNM on 02/24/22.  Her symptoms include urinary urgency and incontinence as well as bowel leakage. Baseline bladder and bowel diary is scanned into media.   She also has lichen sclerosus. Was using the clobetasol once a day then recently increased it to twice a day.   Past Medical History:  Diagnosis Date   Arthritis    CAD (coronary artery disease) 2007   CABG X 1   COVID 03/04/2021   Depression    controlled   Fibromyalgia    GERD (gastroesophageal reflux disease)    Hay fever    Headache    Hx of migraines   Heart murmur    Hyperlipidemia    Hypertension    Idiopathic hypersomnia    Mitral valve prolapse    Narcolepsy    PONV (postoperative nausea and vomiting)    Urinary incontinence      Past Surgical History:  Procedure Laterality Date   cataract surgery     BIL   CORONARY ARTERY BYPASS GRAFT  03/21/2005   off pump LIMA-LAD   myomectomy  1981   reconstructive surgery  1945   lip    REPLACEMENT TOTAL HIP W/  RESURFACING IMPLANTS Right    ROTATOR CUFF REPAIR  1998   3 times   SHOULDER ARTHROSCOPY  2006   TOTAL ABDOMINAL HYSTERECTOMY  1991   TOTAL HIP ARTHROPLASTY Left 03/01/2016   Procedure: LEFT TOTAL HIP ARTHROPLASTY ANTERIOR APPROACH;  Surgeon: Paralee Cancel, MD;  Location: [facility];  Service: Orthopedics;  Laterality: Left;  Requests 70 mins   TOTAL HIP ARTHROPLASTY Right 07/27/2021   Procedure: TOTAL HIP ARTHROPLASTY ANTERIOR APPROACH;  Surgeon: Paralee Cancel, MD;  Location: [facility];  Service: Orthopedics;  Laterality: Right;   trach      is allergic to penicillins, talwin [pentazocine], bextra [valdecoxib], codeine, lactose intolerance  (gi), meperidine hcl, and morphine.   Family History  Problem Relation Age of Onset   Stroke Mother        died 40   Aneurysm Mother    Hypertension Mother    Kidney disease Mother    Bipolar disorder Mother    Lung cancer Father        died 55   Bipolar disorder Other    Hypertension Other    Osteoporosis Maternal Grandmother    Arthritis Maternal Grandmother    Alcohol abuse Maternal Grandfather     Social History   Tobacco Use   Smoking status: Never   Smokeless tobacco: Never  Vaping Use   Vaping Use: Never used  Substance Use Topics   Alcohol use: No   Drug use: No     Review of Systems was negative for a full 10 system review except as noted in the History of Present Illness.  No current facility-administered medications for this encounter.  Current Outpatient Medications:    acetaminophen (TYLENOL) 500 MG tablet, Take 1 tablet (500 mg total) by mouth every 6 (six) hours as needed (pain)., Disp: 30 tablet, Rfl: 0   acetaminophen (TYLENOL) 650 MG CR tablet, Take 1,300 mg by mouth in the morning and at bedtime., Disp: , Rfl:    celecoxib (CELEBREX) 200 MG capsule, Take 200  mg by mouth at bedtime., Disp: , Rfl:    chlorthalidone (HYGROTON) 25 MG tablet, Take 0.5 tablets (12.5 mg total) by mouth daily., Disp: 90 tablet, Rfl: 3   Cholecalciferol (VITAMIN D3 PO), Take 2,000 Units by mouth daily with supper., Disp: , Rfl:    clindamycin (CLEOCIN) 300 MG capsule, 3 po 1 hr prior to dental procedure, Disp: , Rfl:    Clobetasol Prop Emollient Base (CLOBETASOL PROPIONATE E) 0.05 % emollient cream, Apply 1 Application topically at bedtime., Disp: 30 g, Rfl: 11   COLLAGEN-VITAMIN C-BIOTIN PO, Take 3 capsules by mouth daily with supper., Disp: , Rfl:    dextroamphetamine (DEXTROSTAT) 5 MG tablet, Take 4 tablets (20 mg total) by mouth daily., Disp: 120 tablet, Rfl: 0   DULoxetine (CYMBALTA) 60 MG capsule, Take 1 capsule (60 mg total) by mouth daily., Disp: 30 capsule, Rfl: 1    estradiol (ESTRACE) 0.1 MG/GM vaginal cream, Place 0.5 g vaginally 2 (two) times a week. Place 0.5g nightly for two weeks then twice a week after, Disp: 30 g, Rfl: 11   famotidine (PEPCID) 10 MG tablet, Take 10 mg by mouth at bedtime., Disp: , Rfl:    Multiple Vitamin (MULTIVITAMIN WITH MINERALS) TABS tablet, Take 1 tablet by mouth daily with supper. [employer] [address] [employer]! Once Daily Women's 50+, Disp: , Rfl:    oxyCODONE (OXY IR/ROXICODONE) 5 MG immediate release tablet, Take 1 tablet (5 mg total) by mouth every 4 (four) hours as needed for severe pain., Disp: 5 tablet, Rfl: 0   rosuvastatin (CRESTOR) 40 MG tablet, Take 1 tablet (40 mg total) by mouth daily. (Patient taking differently: Take 40 mg by mouth at bedtime.), Disp: 90 tablet, Rfl: 3   valsartan (DIOVAN) 320 MG tablet, Take 1 tablet (320 mg total) by mouth daily. (Patient taking differently: Take 320 mg by mouth at bedtime.), Disp: 90 tablet, Rfl: 3   Objective There were no vitals filed for this visit.   Gen: NAD CV: S1 S2 RRR Lungs: Clear to auscultation bilaterally Abd: soft, nontender  Vulva: lichen sclerosus noted surrounding clitoral hood, around introitus and rectal area.    Assessment/ Plan  The patient is a 72 y.o. year old with overactive bladder scheduled to undergo Stage I and II Sacral Neuromodulation.     Jaquita Folds, MD

## 2022-01-21 DIAGNOSIS — Z96641 Presence of right artificial hip joint: Secondary | ICD-10-CM | POA: Diagnosis not present

## 2022-01-21 DIAGNOSIS — M25551 Pain in right hip: Secondary | ICD-10-CM | POA: Diagnosis not present

## 2022-01-21 DIAGNOSIS — M5136 Other intervertebral disc degeneration, lumbar region: Secondary | ICD-10-CM | POA: Diagnosis not present

## 2022-02-02 DIAGNOSIS — M5416 Radiculopathy, lumbar region: Secondary | ICD-10-CM | POA: Diagnosis not present

## 2022-02-03 ENCOUNTER — Encounter: Payer: Self-pay | Admitting: Obstetrics and Gynecology

## 2022-02-03 DIAGNOSIS — L900 Lichen sclerosus et atrophicus: Secondary | ICD-10-CM

## 2022-02-04 ENCOUNTER — Telehealth: Payer: Self-pay | Admitting: Pulmonary Disease

## 2022-02-04 DIAGNOSIS — M5416 Radiculopathy, lumbar region: Secondary | ICD-10-CM | POA: Diagnosis not present

## 2022-02-04 NOTE — Telephone Encounter (Signed)
Called patient and she is needing refill on her:  Dextroamphetamine '5mg'$   Please advise sir

## 2022-02-04 NOTE — Telephone Encounter (Signed)
Pt calling. Controlled substance needs called in please. Today is the 30 day mark, she said. Please try to have him call it today.  dextroamphetamine (DEXTROSTAT) 5 MG tablet [[id number]]    Her CB is [facility] on [address]  Thank you!

## 2022-02-05 ENCOUNTER — Encounter: Payer: Self-pay | Admitting: Obstetrics and Gynecology

## 2022-02-05 MED ORDER — DEXTROAMPHETAMINE SULFATE 5 MG PO TABS: 20.0000 mg | ORAL_TABLET | Freq: Every day | ORAL | 0 refills | Status: DC

## 2022-02-05 NOTE — Telephone Encounter (Signed)
Refill sent.

## 2022-02-07 DIAGNOSIS — M5416 Radiculopathy, lumbar region: Secondary | ICD-10-CM | POA: Diagnosis not present

## 2022-02-07 NOTE — Telephone Encounter (Signed)
Spoke with patient in regards to refill. She states she will pick that up today. Nothing further needed.

## 2022-02-07 NOTE — Progress Notes (Signed)
Pt stated she is going to re-schedule her procedure. per patient she has been in contact with Dr Tommas Olp office.

## 2022-02-08 ENCOUNTER — Encounter: Payer: Self-pay | Admitting: Family Medicine

## 2022-02-12 DIAGNOSIS — M545 Low back pain, unspecified: Secondary | ICD-10-CM | POA: Diagnosis not present

## 2022-02-12 DIAGNOSIS — M5416 Radiculopathy, lumbar region: Secondary | ICD-10-CM | POA: Diagnosis not present

## 2022-02-14 ENCOUNTER — Ambulatory Visit (HOSPITAL_BASED_OUTPATIENT_CLINIC_OR_DEPARTMENT_OTHER)
Admission: RE | Admit: 2022-02-14 | Payer: Medicare Other | Source: Home / Self Care | Admitting: Obstetrics and Gynecology

## 2022-02-14 SURGERY — INSERTION, SACRAL NERVE STIMULATOR, INTERSTIM, STAGE 1
Anesthesia: Monitor Anesthesia Care

## 2022-02-16 ENCOUNTER — Telehealth: Payer: Self-pay | Admitting: Psychiatry

## 2022-02-16 NOTE — Telephone Encounter (Signed)
Patient is requesting Dr Cottle's advise concerning Lyrica prescribed by her Ortho Dr.for back issues. She is questioning is this ok to take with the Cymbalta due to medication sensitivity. Please advise. # [phone]

## 2022-02-16 NOTE — Telephone Encounter (Signed)
Lyrica is a fairly simple med and not an antidepressant.  It's most common SE is fatigue.  Just start low in the dose and if no benefit then increase it gradually.  If you get too tired then you know you can't go higher.  Give it a try if you need help with pain it's a good choice.

## 2022-02-16 NOTE — Telephone Encounter (Signed)
Please advise 

## 2022-02-17 NOTE — Telephone Encounter (Signed)
Pt informed

## 2022-02-23 DIAGNOSIS — M792 Neuralgia and neuritis, unspecified: Secondary | ICD-10-CM | POA: Diagnosis not present

## 2022-02-23 DIAGNOSIS — M47816 Spondylosis without myelopathy or radiculopathy, lumbar region: Secondary | ICD-10-CM | POA: Insufficient documentation

## 2022-02-23 DIAGNOSIS — M47896 Other spondylosis, lumbar region: Secondary | ICD-10-CM | POA: Diagnosis not present

## 2022-02-23 DIAGNOSIS — M48062 Spinal stenosis, lumbar region with neurogenic claudication: Secondary | ICD-10-CM | POA: Diagnosis not present

## 2022-02-24 ENCOUNTER — Ambulatory Visit (HOSPITAL_BASED_OUTPATIENT_CLINIC_OR_DEPARTMENT_OTHER): Admit: 2022-02-24 | Payer: Medicare Other | Admitting: Obstetrics and Gynecology

## 2022-02-24 ENCOUNTER — Encounter (HOSPITAL_BASED_OUTPATIENT_CLINIC_OR_DEPARTMENT_OTHER): Payer: Self-pay

## 2022-02-24 SURGERY — INSERTION, SACRAL NERVE STIMULATOR, INTERSTIM, STAGE 2
Anesthesia: Monitor Anesthesia Care

## 2022-02-28 ENCOUNTER — Encounter: Payer: Self-pay | Admitting: *Deleted

## 2022-03-03 ENCOUNTER — Encounter: Payer: Self-pay | Admitting: Psychiatry

## 2022-03-03 ENCOUNTER — Ambulatory Visit (INDEPENDENT_AMBULATORY_CARE_PROVIDER_SITE_OTHER): Payer: Medicare Other | Admitting: Psychiatry

## 2022-03-03 DIAGNOSIS — F39 Unspecified mood [affective] disorder: Secondary | ICD-10-CM

## 2022-03-03 DIAGNOSIS — G8929 Other chronic pain: Secondary | ICD-10-CM | POA: Diagnosis not present

## 2022-03-03 DIAGNOSIS — G4710 Hypersomnia, unspecified: Secondary | ICD-10-CM | POA: Diagnosis not present

## 2022-03-03 DIAGNOSIS — M79605 Pain in left leg: Secondary | ICD-10-CM

## 2022-03-03 DIAGNOSIS — M79604 Pain in right leg: Secondary | ICD-10-CM

## 2022-03-03 MED ORDER — DULOXETINE HCL 60 MG PO CPEP: 60.0000 mg | ORAL_CAPSULE | Freq: Every day | ORAL | 3 refills | Status: DC

## 2022-03-03 NOTE — Progress Notes (Signed)
JAMARIA CUFFIE [id number] [birth year] 72 y.o.  Subjective:   Patient ID:  Brandy [name] is a 72 y.o. (DOB Jul 14, 1950) female.  Chief Complaint:  Chief Complaint  Patient presents with   Follow-up    Medication Refill Associated symptoms include arthralgias and fatigue. Pertinent negatives include no chest pain or weakness.   LILLYONNA MAUTNER presents to the office today for follow-up of mood and above.  seen  10/2018.  Meds were not changed.  She remained on fluoxetine 20 mg daily.  10/21/19 appt with the following noted: Vaccinated. Overall pretty good.  Minor depressive episodes which are brief.  Managing the stress.  Nothing extraordinary.  CC is sleepiness.  Saw neurologist recently and no med change.  Has to nap.  Tolerance to the stimulant.  No mood swings. Physical activity helps. Hyper somnolence a big problem and sleep doc rx dextroamphetamin.  Has therapeutic naps.   But it still helps.  Didn't make her hyperverbal and hyperactive like in the past.  Has been very careful with the dosage. Overall better sleep cycle. No mania since here.  Patient reports stable mood and denies irritable moods. No extremes of depression.   Patient denies any recent difficulty with anxiety.   Denies appetite disturbance.  Patient reports that energy and motivation have been better.  Patient denies any difficulty with concentration.  Patient denies any suicidal ideation. No history of cataplexy. No new concerns.   Plan:  disc risk manic type sx with stimulant but understand it's been needed for alertness. Plan mood has been stable on fluoxetine  20 mgand she does not want to change the dose or add any other medicine.  10/19/2020 appointment with the following noted: Had another nocturnal in lab sleep study.  No OSA, minimal PLMS.  Pain interferes with sleep at time in hip.  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 No matter how she sleeps at night has excessive daytime sleepiness.  If busy can  function.  Other wise 2 hour naps.  Exercise rowing 4 times weekly. Not tried [employer]. Injections from Dr. Nelva Bush helped pain. Slump in mood around labor day.  Probably circumstantial and not long lasting.  No manic. Patient reports stable mood and denies depressed or irritable moods.  Patient denies any recent difficulty with anxiety.  Patient denies difficulty with sleep initiation or maintenance. Denies appetite disturbance.  Patient reports that energy and motivation have been good.  Patient denies any difficulty with concentration.  Patient denies any suicidal ideation. Satisfied with fluoxetine.  12/01/21 appt noted: Only psych med is fluoxetine 20 daily. Circumstantially not as good.  Hip pain limited activity and surgery in July and long recovery is hard. Lives alone and not able to be as active. Struggle a lot with narcolepsy even on dextroamphetamine.  Sleeping too much.  Can't afford Sunosi.  Seeing Dr. Halford Chessman. Joined [employer].  Good friends. More aerobic activity would make her feel better. Took duloxetine in past and helped FM.  Asked about it. Stimulants rev her up and make her chatty.  03/03/22 appt noted: hip surg last year.  Jan got overly ambitious and did rowing training.  Back pain and sciatica started. Got spinal epidural yesterday without help so far.   Had switched to duloxetine 60 since here.  Remarkable she is not depressed.  Tolerating it. Appt with spine surgeon soon.  Can't do much.  Can't exercise. Had asked about Lyrica or gabapentin for nerve pain. Satisfied with psych meds.  Retired 2013.  Works  on being active.  Exercises.  Past psych meds:  Fluoxetine,  Lamotrigine 600, Latuda, lithium, duloxetine, Abilify 5, Depakote 1000,  Seroquel sedation, Wellbutrin, Provigil, Celexa 40, Cerefolin NAC,  Trintellix, Trintellix, Viibryd 40, duloxetine, various stimulants,  Restoril, rozerem not not helpful  Review of Systems:  Review of Systems  Constitutional:  Positive for  fatigue.  Cardiovascular:  Negative for chest pain and palpitations.  Musculoskeletal:  Positive for arthralgias and back pain.  Neurological:  Negative for tremors and weakness.  Psychiatric/Behavioral:  Positive for sleep disturbance. Negative for agitation, behavioral problems, confusion, decreased concentration, dysphoric mood, hallucinations, self-injury and suicidal ideas. The patient is not nervous/anxious and is not hyperactive.     Medications: I have reviewed the patient's current medications.  Current Outpatient Medications  Medication Sig Dispense Refill   acetaminophen (TYLENOL) 500 MG tablet Take 1 tablet (500 mg total) by mouth every 6 (six) hours as needed (pain). 30 tablet 0   acetaminophen (TYLENOL) 650 MG CR tablet Take 1,300 mg by mouth in the morning and at bedtime.     celecoxib (CELEBREX) 200 MG capsule Take 200 mg by mouth at bedtime.     chlorthalidone (HYGROTON) 25 MG tablet Take 0.5 tablets (12.5 mg total) by mouth daily. 90 tablet 3   Cholecalciferol (VITAMIN D3 PO) Take 2,000 Units by mouth daily with supper.     Clobetasol Prop Emollient Base (CLOBETASOL PROPIONATE E) 0.05 % emollient cream Apply 1 Application topically at bedtime. 30 g 11   COLLAGEN-VITAMIN C-BIOTIN PO Take 3 capsules by mouth daily with supper.     dextroamphetamine (DEXTROSTAT) 5 MG tablet Take 4 tablets (20 mg total) by mouth daily. 120 tablet 0   estradiol (ESTRACE) 0.1 MG/GM vaginal cream Place 0.5 g vaginally 2 (two) times a week. Place 0.5g nightly for two weeks then twice a week after 30 g 11   famotidine (PEPCID) 10 MG tablet Take 10 mg by mouth at bedtime.     Multiple Vitamin (MULTIVITAMIN WITH MINERALS) TABS tablet Take 1 tablet by mouth daily with supper. [facility] [address] Alive! Once Daily Women's 50+     oxyCODONE (OXY IR/ROXICODONE) 5 MG immediate release tablet Take 1 tablet (5 mg total) by mouth every 4 (four) hours as needed for severe pain. 5 tablet 0   valsartan (DIOVAN) 320 MG  tablet Take 1 tablet (320 mg total) by mouth daily. (Patient taking differently: Take 320 mg by mouth at bedtime.) 90 tablet 3   DULoxetine (CYMBALTA) 60 MG capsule Take 1 capsule (60 mg total) by mouth daily. 90 capsule 3   rosuvastatin (CRESTOR) 40 MG tablet Take 1 tablet (40 mg total) by mouth daily. (Patient taking differently: Take 40 mg by mouth at bedtime.) 90 tablet 3   No current facility-administered medications for this visit.    Medication Side Effects: None  Allergies:  Allergies  Allergen Reactions   Penicillins Anaphylaxis    Has patient had a PCN reaction causing immediate rash, facial/tongue/throat swelling, SOB or lightheadedness with hypotension: Yes Has patient had a PCN reaction causing severe rash involving mucus membranes or skin necrosis: No Has patient had a PCN reaction that required hospitalization Yes Has patient had a PCN reaction occurring within the last 10 years: No If all of the above answers are "NO", then may proceed with Cephalosporin use.   Talwin [Pentazocine] Other (See Comments)    Uncontrolled shaking/tremors   Bextra [Valdecoxib] Hives   Codeine Nausea And Vomiting   Lactose Intolerance (Gi) Diarrhea  Meperidine Hcl Nausea And Vomiting   Morphine Nausea And Vomiting    Past Medical History:  Diagnosis Date   Arthritis    CAD (coronary artery disease) 2007   CABG X 1   COVID 03/04/2021   Depression    controlled   Fibromyalgia    GERD (gastroesophageal reflux disease)    Hay fever    Headache    Hx of migraines   Heart murmur    Hyperlipidemia    Hypertension    Idiopathic hypersomnia    Mitral valve prolapse    Narcolepsy    PONV (postoperative nausea and vomiting)    Urinary incontinence     Family History  Problem Relation Age of Onset   Stroke Mother        died 16   Aneurysm Mother    Hypertension Mother    Kidney disease Mother    Bipolar disorder Mother    Lung cancer Father        died 86   Bipolar disorder  Other    Hypertension Other    Osteoporosis Maternal Grandmother    Arthritis Maternal Grandmother    Alcohol abuse Maternal Grandfather     Social History   Socioeconomic History   Marital status: Divorced    Spouse name: Not on file   Number of children: 0   Years of education: Not on file   Highest education level: Not on file  Occupational History   Occupation: retired  Tobacco Use   Smoking status: Never   Smokeless tobacco: Never  Vaping Use   Vaping Use: Never used  Substance and Sexual Activity   Alcohol use: No   Drug use: No   Sexual activity: Not Currently  Other Topics Concern   Not on file  Social History Narrative   Not on file   Social Determinants of Health   Financial Resource Strain: Low Risk  (09/15/2021)   Overall Financial Resource Strain (CARDIA)    Difficulty of Paying Living Expenses: Not hard at all  Food Insecurity: No Food Insecurity (09/15/2021)   Hunger Vital Sign    Worried About Running Out of Food in the Last Year: Never true    Ran Out of Food in the Last Year: Never true  Transportation Needs: No Transportation Needs (09/15/2021)   PRAPARE - Hydrologist (Medical): No    Lack of Transportation (Non-Medical): No  Physical Activity: Insufficiently Active (09/15/2021)   Exercise Vital Sign    Days of Exercise per Week: 3 days    Minutes of Exercise per Session: 30 min  Stress: No Stress Concern Present (09/15/2021)   [place]    Feeling of Stress : Not at all  Social Connections: Moderately Isolated (09/15/2021)   Social Connection and Isolation Panel [NHANES]    Frequency of Communication with Friends and Family: More than three times a week    Frequency of Social Gatherings with Friends and Family: More than three times a week    Attends Religious Services: More than 4 times per year    Active Member of [employer] or Organizations: No    Attends English as a second language teacher Meetings: Never    Marital Status: Divorced  Human resources officer Violence: Not At Risk (09/15/2021)   Humiliation, Afraid, Rape, and Kick questionnaire    Fear of Current or Ex-Partner: No    Emotionally Abused: No    Physically Abused: No  Sexually Abused: No    Past Medical History, Surgical history, Social history, and Family history were reviewed and updated as appropriate.   Please see review of systems for further details on the patient's review from today.   Objective:   Physical Exam:  LMP 01/10/1989 (Approximate)   Physical Exam Constitutional:      General: She is not in acute distress.    Appearance: She is well-developed.  Musculoskeletal:        General: No deformity.  Neurological:     Mental Status: She is alert and oriented to person, place, and time.     Motor: No tremor.     Coordination: Coordination normal.     Gait: Gait normal.  Psychiatric:        Attention and Perception: Attention normal. She is attentive.        Mood and Affect: Mood normal. Mood is not anxious or depressed. Affect is not labile, blunt, angry or inappropriate.        Speech: Speech normal. Speech is not rapid and pressured.        Behavior: Behavior normal.        Thought Content: Thought content normal. Thought content is not delusional. Thought content does not include homicidal or suicidal ideation. Thought content does not include suicidal plan.        Cognition and Memory: Cognition normal.        Judgment: Judgment normal.     Comments: Insight is good. No hypomania.   Some depression     Lab Review:     Component Value Date/Time   NA 140 07/28/2021 0313   NA 141 07/09/2021 0849   K 4.5 07/28/2021 0313   CL 106 07/28/2021 0313   CO2 25 07/28/2021 0313   GLUCOSE 140 (H) 07/28/2021 0313   BUN 22 07/28/2021 0313   BUN 32 (H) 07/09/2021 0849   CREATININE 1.01 (H) 07/28/2021 0313   CREATININE 0.70 08/11/2015 1155   CALCIUM 9.0 07/28/2021 0313   PROT 6.7  07/09/2021 0849   ALBUMIN 4.8 (H) 07/09/2021 0849   AST 18 07/09/2021 0849   ALT 14 07/09/2021 0849   ALKPHOS 113 07/09/2021 0849   BILITOT 0.6 07/09/2021 0849   GFRNONAA 59 (L) 07/28/2021 0313   GFRAA 81 11/26/2019 1002       Component Value Date/Time   WBC 7.1 10/06/2021 1100   RBC 4.26 10/06/2021 1100   HGB 13.4 10/06/2021 1100   HGB 13.7 07/09/2021 0849   HCT 39.5 10/06/2021 1100   HCT 40.5 07/09/2021 0849   PLT 209.0 10/06/2021 1100   PLT 284 07/09/2021 0849   MCV 92.6 10/06/2021 1100   MCV 89 07/09/2021 0849   MCH 30.2 07/28/2021 0313   MCHC 34.0 10/06/2021 1100   RDW 13.1 10/06/2021 1100   RDW 12.4 07/09/2021 0849   LYMPHSABS 1.9 09/20/2016 1644   MONOABS 0.5 09/20/2016 1644   EOSABS 0.0 09/20/2016 1644   BASOSABS 0.1 09/20/2016 1644    No results found for: "POCLITH", "LITHIUM"   No results found for: "PHENYTOIN", "PHENOBARB", "VALPROATE", "CBMZ"   .res Assessment: Plan:    [place] was seen today for follow-up.  Diagnoses and all orders for this visit:  Episodic mood disorder (HCC) -     DULoxetine (CYMBALTA) 60 MG capsule; Take 1 capsule (60 mg total) by mouth daily.  Hypersomnolence disorder  Chronic pain of both lower extremities -     DULoxetine (CYMBALTA) 60 MG capsule; Take 1 capsule (  60 mg total) by mouth daily.   Greater than 50% of 30 min face to face time with patient was spent on counseling and coordination of care. We discussed stable for some time since retirment. No hypomania since here.  Have had extensive discussions in the past about whether or not she truly bipolar.  She is done well off mood stabilizers the last several years.  Her hypomania in the past may have been more connected to medication induced than true bipolar.  The longer she goes without hypomania the less likely she is truly bipolar.  She thinks prior stressful environment and lack of adequate sleep with higher dose stimulant made her look bipolar.   Reminded her about the  signs and symptoms of mania and hypomania.  She is aware. She's done well off mood stabilizer still for years.  Can't afford Sunosi.  Seeing Dr. Halford Chessman. Disc SE and stimulants.  Disc DDI   switched back to duloxetine 60 to see if it would help pain bc helped FM sx in the past.  But couldn't tell bc then injured her back with bulging nerves.  However mood has been good.  Supportive therapy dealing with aging issues in view of exercise and activity being the way she has managed mood.  Can't do much now until gets back remedied.  This appt was 30 mins.  FU 1 year  [name], MD, DFAPA   Please see After Visit Summary for patient specific instructions.  Future Appointments  Date Time Provider [place]  09/26/2022  8:15 AM [facility]-NURSE HEALTH ADVISOR [id number]  03/06/2023  9:00 AM Cottle, Billey Co., MD [facility] None    No orders of the defined types were placed in this encounter.     -------------------------------

## 2022-03-08 ENCOUNTER — Encounter: Payer: Medicare Other | Admitting: Obstetrics and Gynecology

## 2022-03-10 ENCOUNTER — Telehealth: Payer: Self-pay | Admitting: Pulmonary Disease

## 2022-03-10 NOTE — Telephone Encounter (Signed)
Please advise on medication refill

## 2022-03-10 NOTE — Telephone Encounter (Signed)
Patient states needs refill for Dextroamphetamine. Pharmacy is [facility] Patient phone number is [phone].

## 2022-03-11 MED ORDER — DEXTROAMPHETAMINE SULFATE 5 MG PO TABS: 20.0000 mg | ORAL_TABLET | Freq: Every day | ORAL | 0 refills | Status: DC

## 2022-03-11 NOTE — Telephone Encounter (Signed)
ATC X 1 per DPR Left detailed voicemail in regards to refill on Dextroamphetamine. Advised patient to call back with any questions or concerns

## 2022-03-11 NOTE — Telephone Encounter (Signed)
Refill sent.

## 2022-03-16 DIAGNOSIS — M5451 Vertebrogenic low back pain: Secondary | ICD-10-CM | POA: Diagnosis not present

## 2022-03-25 DIAGNOSIS — M5117 Intervertebral disc disorders with radiculopathy, lumbosacral region: Secondary | ICD-10-CM | POA: Diagnosis not present

## 2022-03-25 DIAGNOSIS — M5416 Radiculopathy, lumbar region: Secondary | ICD-10-CM | POA: Diagnosis not present

## 2022-04-06 ENCOUNTER — Telehealth: Payer: Self-pay

## 2022-04-06 ENCOUNTER — Encounter: Payer: Self-pay | Admitting: Family Medicine

## 2022-04-06 ENCOUNTER — Telehealth: Payer: Self-pay | Admitting: *Deleted

## 2022-04-06 DIAGNOSIS — M5451 Vertebrogenic low back pain: Secondary | ICD-10-CM | POA: Diagnosis not present

## 2022-04-06 DIAGNOSIS — M5416 Radiculopathy, lumbar region: Secondary | ICD-10-CM | POA: Diagnosis not present

## 2022-04-06 NOTE — Telephone Encounter (Signed)
Appt scheduled 04/13/22 @ 2:00 pm. Dm/cma

## 2022-04-06 NOTE — Telephone Encounter (Signed)
   Pre-operative Risk Assessment    Patient Name: Brandy [name]  DOB: 12/11/1950 MRN: [id number]      Request for Surgical Clearance    Procedure:   LUMBAR DECOMPRESSION  Date of Surgery:  Clearance TBD                                 Surgeon:  DR. [facility] Surgeon's Group or Practice Name:  [facility] Phone number:  [phone] Fax number:  [phone]   Type of Clearance Requested:   - Medical    Type of Anesthesia:  Not Indicated   Additional requests/questions:    [name]   04/06/2022, 9:12 AM

## 2022-04-06 NOTE — Telephone Encounter (Signed)
1st attempt to reach pt regarding surgical clearance and the need for a tele visit. Left a message for pt to call back and ask for the preo team.

## 2022-04-06 NOTE — Telephone Encounter (Signed)
Primary Cardiologist:Gayatri Stann Mainland, MD   Preoperative team, please contact this patient and set up a phone call appointment for further preoperative risk assessment. Please obtain consent and complete medication review. Thank you for your help.   I confirm that guidance regarding antiplatelet and oral anticoagulation therapy has been completed and, if necessary, noted below (none requested).  Emmaline Life, NP-C  04/06/2022, 10:30 AM [address] Office [phone] Fax [phone]

## 2022-04-06 NOTE — Telephone Encounter (Signed)
Lft VM to call back to schedule a pre-op appt for back surgery. Dm/cma

## 2022-04-07 ENCOUNTER — Telehealth: Payer: Self-pay | Admitting: *Deleted

## 2022-04-07 NOTE — Telephone Encounter (Signed)
Pt has been scheduled for tele pre op clearance 04/14/22 @ 10 am. Med rec and consent are done

## 2022-04-07 NOTE — Telephone Encounter (Signed)
Pt has been scheduled for tele pre op clearance  04/14/22 @ 10 am. Med rec and consent are done.     Patient Consent for Virtual Visit        Brandy [name] has provided verbal consent on 04/07/2022 for a virtual visit (video or telephone).   CONSENT FOR VIRTUAL VISIT FOR:  Brandy [name]  By participating in this virtual visit I agree to the following:  I hereby voluntarily request, consent and authorize [facility] and its employed or contracted physicians, physician assistants, nurse practitioners or other licensed health care professionals (the Practitioner), to provide me with telemedicine health care services (the "Services") as deemed necessary by the treating Practitioner. I acknowledge and consent to receive the Services by the Practitioner via telemedicine. I understand that the telemedicine visit will involve communicating with the Practitioner through live audiovisual communication technology and the disclosure of certain medical information by electronic transmission. I acknowledge that I have been given the opportunity to request an in-person assessment or other available alternative prior to the telemedicine visit and am voluntarily participating in the telemedicine visit.  I understand that I have the right to withhold or withdraw my consent to the use of telemedicine in the course of my care at any time, without affecting my right to future care or treatment, and that the Practitioner or I may terminate the telemedicine visit at any time. I understand that I have the right to inspect all information obtained and/or recorded in the course of the telemedicine visit and may receive copies of available information for a reasonable fee.  I understand that some of the potential risks of receiving the Services via telemedicine include:  Delay or interruption in medical evaluation due to technological equipment failure or disruption; Information transmitted may not be  sufficient (e.g. poor resolution of images) to allow for appropriate medical decision making by the Practitioner; and/or  In rare instances, security protocols could fail, causing a breach of personal health information.  Furthermore, I acknowledge that it is my responsibility to provide information about my medical history, conditions and care that is complete and accurate to the best of my ability. I acknowledge that Practitioner's advice, recommendations, and/or decision may be based on factors not within their control, such as incomplete or inaccurate data provided by me or distortions of diagnostic images or specimens that may result from electronic transmissions. I understand that the practice of medicine is not an exact science and that Practitioner makes no warranties or guarantees regarding treatment outcomes. I acknowledge that a copy of this consent can be made available to me via my patient portal ([facility]), or I can request a printed copy by calling the office of [facility].    I understand that my insurance will be billed for this visit.   I have read or had this consent read to me. I understand the contents of this consent, which adequately explains the benefits and risks of the Services being provided via telemedicine.  I have been provided ample opportunity to ask questions regarding this consent and the Services and have had my questions answered to my satisfaction. I give my informed consent for the services to be provided through the use of telemedicine in my medical care

## 2022-04-13 ENCOUNTER — Ambulatory Visit (INDEPENDENT_AMBULATORY_CARE_PROVIDER_SITE_OTHER): Payer: Medicare Other | Admitting: Family Medicine

## 2022-04-13 ENCOUNTER — Encounter: Payer: Self-pay | Admitting: Family Medicine

## 2022-04-13 VITALS — BP 122/70 | HR 57 | Temp 98.7°F | Ht 60.0 in | Wt 117.4 lb

## 2022-04-13 DIAGNOSIS — Z01818 Encounter for other preprocedural examination: Secondary | ICD-10-CM

## 2022-04-13 DIAGNOSIS — M47816 Spondylosis without myelopathy or radiculopathy, lumbar region: Secondary | ICD-10-CM

## 2022-04-13 DIAGNOSIS — M5126 Other intervertebral disc displacement, lumbar region: Secondary | ICD-10-CM | POA: Diagnosis not present

## 2022-04-13 NOTE — Assessment & Plan Note (Signed)
Brandy [name] is scheduled to see cardiology for cardiac clearance for surgery. Her blood pressure is well controlled and she has no sign of any angina. I will check a CBC and CMP. As long as this is acceptable, I will plan to clear her medically for surgery.

## 2022-04-13 NOTE — Progress Notes (Signed)
[facility] [address] [place] [address] Dept: [phone] Dept Fax: [phone]  Office Visit  Subjective:    Patient ID: Brandy [name], female    DOB: 20-Mar-1950, 72 y.o..   MRN: [id number]  No chief complaint on file.  History of Present Illness:  Patient is in today for preoperative assessment at the request of Dr. Rolena [name]. Brandy [name] has an issue with spinal stenosis at L3-4 and foraminal disc protrusion at L4-5. She has not ahd improvement with ESI. He plans to perform a lumbar decompression and foraminotomy.  Brandy [name] has a history of having had a single-vessel CABG. She has a history of hypertension and is managed on valsartan 320 mg daily and chlorthalidone 25 mg daily. She has hyperlipidemia and is managed on rosuvastatin 40 mg daily. Overall her health has been excellent. She has been very active. Prior to her current back issue, she was rowing regularly (both indoor and outdoor). She denies any current chest pain, dyspnea, orthopnea, or swelling.  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   Laryngospasms 04/22/2020   Numbness and tingling in both hands 06/20/2018   Rosacea 08/24/2017   S/P left THA, AA 03/01/2016   S/P hip replacement, left 03/01/2016   Circadian rhythm sleep disorder, 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   Hyperlipidemia    Narcolepsy without cataplexy    CAD (coronary artery disease)    Essential hypertension    Past  Surgical History:  Procedure Laterality Date   cataract surgery     BIL   CORONARY ARTERY BYPASS GRAFT  03/21/2005   off pump LIMA-LAD   myomectomy  1981   reconstructive surgery  1945   lip    REPLACEMENT TOTAL HIP W/  RESURFACING IMPLANTS Right    ROTATOR CUFF REPAIR  1998   3 times   SHOULDER ARTHROSCOPY  2006   TOTAL ABDOMINAL HYSTERECTOMY  1991   TOTAL HIP ARTHROPLASTY Left 03/01/2016   Procedure: LEFT TOTAL HIP ARTHROPLASTY ANTERIOR APPROACH;  Surgeon: Paralee Cancel, MD;  Location: [facility];  Service: Orthopedics;  Laterality: Left;  Requests 70 mins   TOTAL HIP ARTHROPLASTY Right 07/27/2021   Procedure: TOTAL HIP ARTHROPLASTY ANTERIOR APPROACH;  Surgeon: Paralee Cancel, MD;  Location: [facility];  Service: Orthopedics;  Laterality: Right;   trach     Family History  Problem Relation Age of Onset   Stroke Mother        died 67   Aneurysm Mother    Hypertension Mother    Kidney disease Mother    Bipolar disorder Mother    Lung cancer Father        died 57   Bipolar disorder Other    Hypertension Other    Osteoporosis Maternal Grandmother    Arthritis Maternal Grandmother    Alcohol abuse Maternal Grandfather    Outpatient Medications Prior to Visit  Medication Sig Dispense Refill   acetaminophen (TYLENOL) 650 MG CR tablet Take 1,300 mg by mouth in the morning and at bedtime.     celecoxib (  CELEBREX) 200 MG capsule Take 200 mg by mouth at bedtime.     chlorthalidone (HYGROTON) 25 MG tablet Take 0.5 tablets (12.5 mg total) by mouth daily. 90 tablet 3   Cholecalciferol (VITAMIN D3 PO) Take 2,000 Units by mouth daily with supper.     Clobetasol Prop Emollient Base (CLOBETASOL PROPIONATE E) 0.05 % emollient cream Apply 1 Application topically at bedtime. 30 g 11   COLLAGEN-VITAMIN C-BIOTIN PO Take 3 capsules by mouth daily with supper.     dextroamphetamine (DEXTROSTAT) 5 MG tablet Take 4 tablets (20 mg total) by mouth daily. 120 tablet 0   DULoxetine (CYMBALTA) 60 MG capsule  Take 1 capsule (60 mg total) by mouth daily. 90 capsule 3   estradiol (ESTRACE) 0.1 MG/GM vaginal cream Place 0.5 g vaginally 2 (two) times a week. Place 0.5g nightly for two weeks then twice a week after (Patient taking differently: Place 0.5 g vaginally 2 (two) times a week. Place 0.5g nightly for two weeks then twice a week after; PRN) 30 g 11   famotidine (PEPCID) 10 MG tablet Take 10 mg by mouth at bedtime.     methocarbamol (ROBAXIN) 500 MG tablet Take 500 mg by mouth 3 (three) times daily.     Multiple Vitamin (MULTIVITAMIN WITH MINERALS) TABS tablet Take 1 tablet by mouth daily with supper. Nature[facility] [address] Alive! Once Daily Women's 50+     rosuvastatin (CRESTOR) 40 MG tablet Take 1 tablet (40 mg total) by mouth daily. (Patient taking differently: Take 40 mg by mouth at bedtime.) 90 tablet 3   traMADol (ULTRAM) 50 MG tablet Take 50 mg by mouth every 6 (six) hours as needed.     valsartan (DIOVAN) 320 MG tablet Take 1 tablet (320 mg total) by mouth daily. (Patient taking differently: Take 320 mg by mouth at bedtime.) 90 tablet 3   acetaminophen (TYLENOL) 500 MG tablet Take 1 tablet (500 mg total) by mouth every 6 (six) hours as needed (pain). (Patient not taking: Reported on 04/07/2022) 30 tablet 0   oxyCODONE (OXY IR/ROXICODONE) 5 MG immediate release tablet Take 1 tablet (5 mg total) by mouth every 4 (four) hours as needed for severe pain. (Patient not taking: Reported on 04/07/2022) 5 tablet 0   No facility-administered medications prior to visit.   Allergies  Allergen Reactions   Penicillins Anaphylaxis    Has patient had a PCN reaction causing immediate rash, facial/tongue/throat swelling, SOB or lightheadedness with hypotension: Yes Has patient had a PCN reaction causing severe rash involving mucus membranes or skin necrosis: No Has patient had a PCN reaction that required hospitalization Yes Has patient had a PCN reaction occurring within the last 10 years: No If all of the above answers  are "NO", then may proceed with Cephalosporin use.   Talwin [Pentazocine] Other (See Comments)    Uncontrolled shaking/tremors   Bextra [Valdecoxib] Hives   Codeine Nausea And Vomiting   Lactose Intolerance (Gi) Diarrhea   Meperidine Hcl Nausea And Vomiting   Morphine Nausea And Vomiting     Objective:   Today's Vitals   04/13/22 1436  BP: 122/70  Pulse: (!) 57  Temp: 98.7 F (37.1 C)  TempSrc: Temporal  SpO2: 98%  Weight: 117 lb 6.4 oz (53.3 kg)  Height: 5' (1.524 m)   Body mass index is 22.93 kg/m.   General: Well developed, well nourished. No acute distress. HEENT: Normocephalic, non-traumatic. PERRL, EOMI. Conjunctiva clear. External ears normal. EAC and TMs normal bilaterally. Nose  clear without congestion or rhinorrhea. Mucous membranes moist. Oropharynx clear. Good dentition. Neck: Supple. No lymphadenopathy. No thyromegaly. Lungs: Clear to auscultation bilaterally. No wheezing, rales or rhonchi. CV: RRR with a II-III/VI holosystolic murmur. Pulses 2+ bilaterally. Abdomen: Soft, non-tender. Bowel sounds positive, normal pitch and frequency. No hepatosplenomegaly. No rebound or guarding. Back: Straight. No CVA tenderness bilaterally. Extremities: Full ROM. No joint swelling or tenderness. No edema noted. Skin: Warm and dry. No rashes. Neuro: CN II-XII intact. Normal sensation and DTR bilaterally. Psych: Alert and oriented. Normal mood and affect.  There are no preventive care reminders to display for this patient.    Assessment & Plan:   Problem List Items Addressed This Visit       Musculoskeletal and Integument   Lumbar spondylosis   Herniated intervertebral disc of lumbar spine     Other   Preoperative examination - Primary    Ms. Bragan is scheduled to see cardiology for cardiac clearance for surgery. Her blood pressure is well controlled and she has no sign of any angina. I will check a CBC and CMP. As long as this is acceptable, I will plan to clear  her medically for surgery.      Relevant Orders   CBC   Comprehensive metabolic panel   Return for Reassessment 1 month after surgery.   Haydee Salter, MD

## 2022-04-13 NOTE — Progress Notes (Unsigned)
Virtual Visit via Telephone Note   Because of Brandy [name]'s co-morbid illnesses, she is at least at moderate risk for complications without adequate follow up.  This format is felt to be most appropriate for this patient at this time.  The patient did not have access to video technology/had technical difficulties with video requiring transitioning to audio format only (telephone).  All issues noted in this document were discussed and addressed.  No physical exam could be performed with this format.  Please refer to the patient's chart for her consent to telehealth for [facility].  Evaluation Performed:  Preoperative cardiovascular risk assessment _____________   Date:  04/13/2022   Patient ID:  Brandy [name], DOB May 13, 1950, MRN [id number] Patient Location:  Home Provider location:   Office  Primary Care Provider:  Haydee Salter, MD Primary Cardiologist:  Elouise Munroe, MD  Chief Complaint / Patient Profile   72 y.o. y/o female with a h/o coronary artery disease status post CABG hypertension, hyperlipidemia who is pending lumbar decompression and presents today for telephonic preoperative cardiovascular risk assessment.  History of Present Illness    Brandy [name] is a 72 y.o. female who presents via audio/video conferencing for a telehealth visit today.  Pt was last seen in cardiology clinic on 11/30/2021 by Dr. Margaretann Loveless.  At that time Brandy [name] was doing well .  The patient is now pending procedure as outlined above. Since her last visit, she remained stable from a cardiac standpoint.  Today she denies chest pain, shortness of breath, lower extremity edema, fatigue, palpitations, melena, hematuria, hemoptysis, diaphoresis, weakness, presyncope, syncope, orthopnea, and PND.   Past Medical History    Past Medical History:  Diagnosis Date   Arthritis    CAD (coronary artery disease) 2007   CABG X 1   COVID 03/04/2021   Depression     controlled   Fibromyalgia    GERD (gastroesophageal reflux disease)    Hay fever    Headache    Hx of migraines   Heart murmur    Hyperlipidemia    Hypertension    Idiopathic hypersomnia    Mitral valve prolapse    Narcolepsy    PONV (postoperative nausea and vomiting)    Urinary incontinence    Past Surgical History:  Procedure Laterality Date   cataract surgery     BIL   CORONARY ARTERY BYPASS GRAFT  03/21/2005   off pump LIMA-LAD   myomectomy  1981   reconstructive surgery  1945   lip    REPLACEMENT TOTAL HIP W/  RESURFACING IMPLANTS Right    ROTATOR CUFF REPAIR  1998   3 times   SHOULDER ARTHROSCOPY  2006   TOTAL ABDOMINAL HYSTERECTOMY  1991   TOTAL HIP ARTHROPLASTY Left 03/01/2016   Procedure: LEFT TOTAL HIP ARTHROPLASTY ANTERIOR APPROACH;  Surgeon: Paralee Cancel, MD;  Location: [facility];  Service: Orthopedics;  Laterality: Left;  Requests 70 mins   TOTAL HIP ARTHROPLASTY Right 07/27/2021   Procedure: TOTAL HIP ARTHROPLASTY ANTERIOR APPROACH;  Surgeon: Paralee Cancel, MD;  Location: [facility];  Service: Orthopedics;  Laterality: Right;   trach      Allergies  Allergies  Allergen Reactions   Penicillins Anaphylaxis    Has patient had a PCN reaction causing immediate rash, facial/tongue/throat swelling, SOB or lightheadedness with hypotension: Yes Has patient had a PCN reaction causing severe rash involving mucus membranes or skin necrosis: No Has patient had a PCN reaction that required  hospitalization Yes Has patient had a PCN reaction occurring within the last 10 years: No If all of the above answers are "NO", then may proceed with Cephalosporin use.   Talwin [Pentazocine] Other (See Comments)    Uncontrolled shaking/tremors   Bextra [Valdecoxib] Hives   Codeine Nausea And Vomiting   Lactose Intolerance (Gi) Diarrhea   Meperidine Hcl Nausea And Vomiting   Morphine Nausea And Vomiting    Home Medications    Prior to Admission medications   Medication Sig Start  Date End Date Taking? Authorizing Provider  acetaminophen (TYLENOL) 500 MG tablet Take 1 tablet (500 mg total) by mouth every 6 (six) hours as needed (pain). Patient not taking: Reported on 04/07/2022 01/14/22   Jaquita Folds, MD  acetaminophen (TYLENOL) 650 MG CR tablet Take 1,300 mg by mouth in the morning and at bedtime.    [provider]  celecoxib (CELEBREX) 200 MG capsule Take 200 mg by mouth at bedtime.    [provider]  chlorthalidone (HYGROTON) 25 MG tablet Take 0.5 tablets (12.5 mg total) by mouth daily. 06/09/21   Warren Lacy, PA-C  Cholecalciferol (VITAMIN D3 PO) Take 2,000 Units by mouth daily with supper. Patient not taking: Reported on 04/07/2022    [provider]  Clobetasol Prop Emollient Base (CLOBETASOL PROPIONATE E) 0.05 % emollient cream Apply 1 Application topically at bedtime. 11/05/21   Jaquita Folds, MD  COLLAGEN-VITAMIN C-BIOTIN PO Take 3 capsules by mouth daily with supper. Patient not taking: Reported on 04/07/2022    [provider]  dextroamphetamine (DEXTROSTAT) 5 MG tablet Take 4 tablets (20 mg total) by mouth daily. 03/11/22   [name], MD  DULoxetine (CYMBALTA) 60 MG capsule Take 1 capsule (60 mg total) by mouth daily. 03/03/22   Cottle, Billey Co., MD  estradiol (ESTRACE) 0.1 MG/GM vaginal cream Place 0.5 g vaginally 2 (two) times a week. Place 0.5g nightly for two weeks then twice a week after Patient taking differently: Place 0.5 g vaginally 2 (two) times a week. Place 0.5g nightly for two weeks then twice a week after; PRN 11/08/21   Jaquita Folds, MD  famotidine (PEPCID) 10 MG tablet Take 10 mg by mouth at bedtime.    [provider]  methocarbamol (ROBAXIN) 500 MG tablet Take 500 mg by mouth 3 (three) times daily. 03/02/22   [provider]  Multiple Vitamin (MULTIVITAMIN WITH MINERALS) TABS tablet Take 1 tablet by mouth daily with supper. [address] Alive! Once Daily  Women's 50+    [provider]  oxyCODONE (OXY IR/ROXICODONE) 5 MG immediate release tablet Take 1 tablet (5 mg total) by mouth every 4 (four) hours as needed for severe pain. Patient not taking: Reported on 04/07/2022 01/14/22   Jaquita Folds, MD  rosuvastatin (CRESTOR) 40 MG tablet Take 1 tablet (40 mg total) by mouth daily. Patient taking differently: Take 40 mg by mouth at bedtime. 06/09/21 04/07/22  Warren Lacy, PA-C  traMADol (ULTRAM) 50 MG tablet Take 50 mg by mouth every 6 (six) hours as needed.    [provider]  valsartan (DIOVAN) 320 MG tablet Take 1 tablet (320 mg total) by mouth daily. Patient taking differently: Take 320 mg by mouth at bedtime. 06/09/21   Warren Lacy, PA-C    Physical Exam    Vital Signs:  TALYSSA GARBERS does not have vital signs available for review today.  Given telephonic nature of communication, physical exam is limited. AAOx3.  NAD. Normal affect.  Speech and respirations are unlabored.  Accessory Clinical Findings    None  Assessment & Plan    1.  Preoperative Cardiovascular Risk Assessment: Lumbar decompression, [facility], Dr. Melina Schools      Primary Cardiologist: Elouise Munroe, MD  Chart reviewed as part of pre-operative protocol coverage. Given past medical history and time since last visit, based on ACC/AHA guidelines, REAM ALLEYNE would be at acceptable risk for the planned procedure without further cardiovascular testing.   Her RCRI is a class II risk, 0.9% risk of major cardiac event.  She is able to complete greater than 4 METS of physical activity.  Patient was advised that if she develops new symptoms prior to surgery to contact our office to arrange a follow-up appointment.  She verbalized understanding.  I will route this recommendation to the requesting party via Epic fax function and remove from pre-op pool.  Please call with questions.       Time:   Today, I  have spent 8 minutes with the patient with telehealth technology discussing medical history, symptoms, and management plan.  Prior to her phone evaluation I spent greater than 10 minutes reviewing her past medical history and cardiac medications.   Deberah Pelton, NP  04/13/2022, 7:35 AM

## 2022-04-14 ENCOUNTER — Telehealth: Payer: Self-pay | Admitting: Pulmonary Disease

## 2022-04-14 ENCOUNTER — Telehealth: Payer: Self-pay

## 2022-04-14 ENCOUNTER — Ambulatory Visit: Payer: Medicare Other | Attending: Cardiology

## 2022-04-14 DIAGNOSIS — Z01810 Encounter for preprocedural cardiovascular examination: Secondary | ICD-10-CM

## 2022-04-14 LAB — COMPREHENSIVE METABOLIC PANEL
ALT: 14 U/L (ref 0–35)
AST: 21 U/L (ref 0–37)
Albumin: 4.6 g/dL (ref 3.5–5.2)
Alkaline Phosphatase: 90 U/L (ref 39–117)
BUN: 33 mg/dL — ABNORMAL HIGH (ref 6–23)
CO2: 27 mEq/L (ref 19–32)
Calcium: 9.6 mg/dL (ref 8.4–10.5)
Chloride: 104 mEq/L (ref 96–112)
Creatinine, Ser: 1.01 mg/dL (ref 0.40–1.20)
GFR: 55.06 mL/min — ABNORMAL LOW (ref >60.00)
Glucose, Bld: 116 mg/dL — ABNORMAL HIGH (ref 70–99)
Potassium: 4 mEq/L (ref 3.5–5.1)
Sodium: 141 mEq/L (ref 135–145)
Total Bilirubin: 0.7 mg/dL (ref 0.2–1.2)
Total Protein: 6.6 g/dL (ref 6.0–8.3)

## 2022-04-14 LAB — CBC
HCT: 39 % (ref 36.0–46.0)
Hemoglobin: 12.9 g/dL (ref 12.0–15.0)
MCHC: 33.1 g/dL (ref 30.0–36.0)
MCV: 91.2 fl (ref 78.0–100.0)
Platelets: 310 10*3/uL (ref 150.0–400.0)
RBC: 4.27 Mil/uL (ref 3.87–5.11)
RDW: 13.9 % (ref 11.5–15.5)
WBC: 11 10*3/uL — ABNORMAL HIGH (ref 4.0–10.5)

## 2022-04-14 NOTE — Telephone Encounter (Signed)
Pre-op clearance form faxed to [facility] @ [phone].  Dm/cma

## 2022-04-14 NOTE — Telephone Encounter (Signed)
RX needed needed for Dextro amphetamine.Knows Dr. must sign off on it.    Devin Going is : [facility] [address]    Her # is [phone]

## 2022-04-18 MED ORDER — DEXTROAMPHETAMINE SULFATE 5 MG PO TABS: 20.0000 mg | ORAL_TABLET | Freq: Every day | ORAL | 0 refills | Status: DC

## 2022-04-18 NOTE — Telephone Encounter (Signed)
Called and spoke with patient. She is aware that the refill has been sent to her pharmacy.   Nothing further needed at time of call.

## 2022-04-18 NOTE — Telephone Encounter (Signed)
Refill sent.

## 2022-04-18 NOTE — Telephone Encounter (Signed)
Called and spoke with patient. Patient stated she needed her dextro to be refilled and she needed the doctor to sign off on it.  VS, please advise.

## 2022-04-22 ENCOUNTER — Ambulatory Visit (INDEPENDENT_AMBULATORY_CARE_PROVIDER_SITE_OTHER): Payer: Medicare Other | Admitting: Family Medicine

## 2022-04-22 ENCOUNTER — Encounter: Payer: Self-pay | Admitting: Family Medicine

## 2022-04-22 VITALS — BP 130/66 | HR 83 | Temp 97.0°F | Ht 60.0 in | Wt 117.8 lb

## 2022-04-22 DIAGNOSIS — N3000 Acute cystitis without hematuria: Secondary | ICD-10-CM

## 2022-04-22 LAB — POCT URINALYSIS DIPSTICK
Bilirubin, UA: NEGATIVE
Blood, UA: NEGATIVE
Glucose, UA: NEGATIVE
Ketones, UA: NEGATIVE
Nitrite, UA: NEGATIVE
Protein, UA: NEGATIVE
Spec Grav, UA: 1.02 (ref 1.010–1.025)
Urobilinogen, UA: 0.2 E.U./dL
pH, UA: 5.5 (ref 5.0–8.0)

## 2022-04-22 MED ORDER — SULFAMETHOXAZOLE-TRIMETHOPRIM 800-160 MG PO TABS: 1.0000 | ORAL_TABLET | Freq: Two times a day (BID) | ORAL | 0 refills | Status: DC

## 2022-04-22 NOTE — Progress Notes (Signed)
[facility] [address] [place] [address] Dept: [phone] Dept Fax: [phone]  Office Visit  Subjective:    Patient ID: Brandy [name], female    DOB: 09/26/1950, 72 y.o..   MRN: [id number]  Chief Complaint  Patient presents with   Urinary Frequency    C/o having urine frequency/cloudy x 2-3 days.    History of Present Illness:  Patient is in today with a 3-day history of urinary frequency with small urine volumes and a cloudy-appearing urine. She has not had any dysuria. Urinary urgency has been a chronic issue for her   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   Laryngospasms 04/22/2020   Numbness and tingling in both hands 06/20/2018   Rosacea 08/24/2017   S/P left THA, AA 03/01/2016   S/P hip replacement, left 03/01/2016   Circadian rhythm sleep disorder, 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   Hyperlipidemia    Narcolepsy without cataplexy    CAD (coronary artery disease)    Essential hypertension    Past Surgical History:  Procedure Laterality Date   cataract surgery     BIL   CORONARY ARTERY BYPASS GRAFT  03/21/2005   off pump LIMA-LAD   myomectomy  1981   reconstructive surgery  1945   lip    REPLACEMENT TOTAL HIP W/  RESURFACING IMPLANTS Right    ROTATOR CUFF REPAIR  1998   3 times   SHOULDER  ARTHROSCOPY  2006   TOTAL ABDOMINAL HYSTERECTOMY  1991   TOTAL HIP ARTHROPLASTY Left 03/01/2016   Procedure: LEFT TOTAL HIP ARTHROPLASTY ANTERIOR APPROACH;  Surgeon: Durene Romans, MD;  Location: [facility];  Service: Orthopedics;  Laterality: Left;  Requests 70 mins   TOTAL HIP ARTHROPLASTY Right 07/27/2021   Procedure: TOTAL HIP ARTHROPLASTY ANTERIOR APPROACH;  Surgeon: Durene Romans, MD;  Location: [facility];  Service: Orthopedics;  Laterality: Right;   trach     Family History  Problem Relation Age of Onset   Stroke Mother        died 27   Aneurysm Mother    Hypertension Mother    Kidney disease Mother    Bipolar disorder Mother    Lung cancer Father        died 27   Bipolar disorder Other    Hypertension Other    Osteoporosis Maternal Grandmother    Arthritis Maternal Grandmother    Alcohol abuse Maternal Grandfather    Outpatient Medications Prior to Visit  Medication Sig Dispense Refill   acetaminophen (TYLENOL) 650 MG CR tablet Take 1,300 mg by mouth in the morning and at bedtime.     celecoxib (CELEBREX) 200 MG capsule Take 200 mg by mouth at bedtime.     chlorthalidone (HYGROTON) 25 MG tablet Take 0.5 tablets (12.5 mg total) by mouth daily. 90 tablet 3   Cholecalciferol (VITAMIN D3 PO) Take 2,000 Units by mouth daily with supper.     Clobetasol  Prop Emollient Base (CLOBETASOL PROPIONATE E) 0.05 % emollient cream Apply 1 Application topically at bedtime. 30 g 11   COLLAGEN-VITAMIN C-BIOTIN PO Take 3 capsules by mouth daily with supper.     dextroamphetamine (DEXTROSTAT) 5 MG tablet Take 4 tablets (20 mg total) by mouth daily. 120 tablet 0   DULoxetine (CYMBALTA) 60 MG capsule Take 1 capsule (60 mg total) by mouth daily. 90 capsule 3   estradiol (ESTRACE) 0.1 MG/GM vaginal cream Place 0.5 g vaginally 2 (two) times a week. Place 0.5g nightly for two weeks then twice a week after (Patient taking differently: Place 0.5 g vaginally 2 (two) times a week. Place 0.5g nightly for two weeks  then twice a week after; PRN) 30 g 11   famotidine (PEPCID) 10 MG tablet Take 10 mg by mouth at bedtime.     methocarbamol (ROBAXIN) 500 MG tablet Take 500 mg by mouth 3 (three) times daily.     Multiple Vitamin (MULTIVITAMIN WITH MINERALS) TABS tablet Take 1 tablet by mouth daily with supper. [facility] [address] Alive! Once Daily Women's 50+     traMADol (ULTRAM) 50 MG tablet Take 50 mg by mouth every 6 (six) hours as needed.     valsartan (DIOVAN) 320 MG tablet Take 1 tablet (320 mg total) by mouth daily. (Patient taking differently: Take 320 mg by mouth at bedtime.) 90 tablet 3   rosuvastatin (CRESTOR) 40 MG tablet Take 1 tablet (40 mg total) by mouth daily. (Patient taking differently: Take 40 mg by mouth at bedtime.) 90 tablet 3   No facility-administered medications prior to visit.   Allergies  Allergen Reactions   Penicillins Anaphylaxis    Has patient had a PCN reaction causing immediate rash, facial/tongue/throat swelling, SOB or lightheadedness with hypotension: Yes Has patient had a PCN reaction causing severe rash involving mucus membranes or skin necrosis: No Has patient had a PCN reaction that required hospitalization Yes Has patient had a PCN reaction occurring within the last 10 years: No If all of the above answers are "NO", then may proceed with Cephalosporin use.   Talwin [Pentazocine] Other (See Comments)    Uncontrolled shaking/tremors   Bextra [Valdecoxib] Hives   Codeine Nausea And Vomiting   Lactose Intolerance (Gi) Diarrhea   Meperidine Hcl Nausea And Vomiting   Morphine Nausea And Vomiting     Objective:   Today's Vitals   04/22/22 1109  BP: 130/66  Pulse: 83  Temp: (!) 97 F (36.1 C)  TempSrc: Temporal  SpO2: 100%  Weight: 117 lb 12.8 oz (53.4 kg)  Height: 5' (1.524 m)   Body mass index is 23.01 kg/m.   General: Well developed, well nourished. No acute distress. Psych: Alert and oriented. Normal mood and affect.  There are no preventive care reminders  to display for this patient.  Lab Results Component Ref Range & Units 11:23  Color, UA yellow  Clarity, UA cloudy  Glucose, UA Negative Negative  Bilirubin, UA neg  Ketones, UA neg  Spec Grav, UA 1.010 - 1.025 1.020  Blood, UA neg  pH, UA 5.0 - 8.0 5.5  Protein, UA Negative Negative  Urobilinogen, UA 0.2 or 1.0 E.U./dL 0.2  Nitrite, UA neg  Leukocytes, UA Negative Moderate (2+) Abnormal   Appearance   Odor        Assessment & Plan:   Problem List Items Addressed This Visit   None Visit Diagnoses     Acute cystitis without hematuria    -  Primary  Urine c/w acute UTI. Iwill send the urine for culture. I will treat her empirically with a 3-day course of Septra.   Relevant Medications   sulfamethoxazole-trimethoprim (BACTRIM DS) 800-160 MG tablet   Other Relevant Orders   POCT Urinalysis Dipstick (Completed)   Urine Culture      Return if symptoms worsen or fail to improve.   Loyola Mast, MD

## 2022-04-24 LAB — URINE CULTURE
MICRO NUMBER:: 14818204
SPECIMEN QUALITY:: ADEQUATE

## 2022-04-28 ENCOUNTER — Encounter: Payer: Self-pay | Admitting: Family Medicine

## 2022-05-03 ENCOUNTER — Ambulatory Visit (HOSPITAL_COMMUNITY): Payer: Self-pay | Admitting: Orthopedic Surgery

## 2022-05-09 ENCOUNTER — Encounter: Payer: Self-pay | Admitting: Family Medicine

## 2022-05-16 ENCOUNTER — Inpatient Hospital Stay (HOSPITAL_COMMUNITY): Admission: RE | Admit: 2022-05-16 | Payer: Medicare Other | Source: Ambulatory Visit

## 2022-05-19 ENCOUNTER — Telehealth: Payer: Self-pay | Admitting: Pulmonary Disease

## 2022-05-19 NOTE — Telephone Encounter (Signed)
Patient states needs refill for Dextroamphetamine. Pharmacy is [facility] [address] Patient phone number is [phone]. 

## 2022-05-19 NOTE — Telephone Encounter (Signed)
ATC patient. LVMTCB. 

## 2022-05-20 NOTE — Telephone Encounter (Signed)
Pt. Called back and wanted to speak to nurse

## 2022-05-23 ENCOUNTER — Encounter (HOSPITAL_COMMUNITY): Admission: RE | Payer: Self-pay | Source: Home / Self Care

## 2022-05-23 ENCOUNTER — Ambulatory Visit (HOSPITAL_COMMUNITY): Admission: RE | Admit: 2022-05-23 | Payer: Medicare Other | Source: Home / Self Care | Admitting: Orthopedic Surgery

## 2022-05-23 SURGERY — LUMBAR LAMINECTOMY/DECOMPRESSION MICRODISCECTOMY 1 LEVEL
Anesthesia: General

## 2022-05-23 MED ORDER — DEXTROAMPHETAMINE SULFATE 5 MG PO TABS: 20.0000 mg | ORAL_TABLET | Freq: Every day | ORAL | 0 refills | Status: DC

## 2022-05-23 NOTE — Telephone Encounter (Signed)
Refill sent.

## 2022-05-23 NOTE — Addendum Note (Signed)
Addended by: [name] on: 05/23/2022 01:15 PM   Modules accepted: Orders

## 2022-05-23 NOTE — Telephone Encounter (Signed)
ATC pt. LVMTCB.  

## 2022-05-23 NOTE — Telephone Encounter (Signed)
Pt called and is requesting a refill of her dextroamphetamine (DEXTROSTAT) 5 MG tablet  she states that she will be out of the medication tomorrow. Pharmacy is [facility] on [address], [place]

## 2022-06-23 ENCOUNTER — Telehealth: Payer: Self-pay | Admitting: Pulmonary Disease

## 2022-06-23 NOTE — Telephone Encounter (Signed)
Pt called and is requesting a refill of her dextroamphetamine (DEXTROSTAT) 5 MG tablet  she states that she will be out of the medication tomorrow. Pharmacy is [facility] on [address], [place]   

## 2022-06-28 MED ORDER — DEXTROAMPHETAMINE SULFATE 5 MG PO TABS: 20.0000 mg | ORAL_TABLET | Freq: Every day | ORAL | 0 refills | Status: DC

## 2022-06-28 NOTE — Telephone Encounter (Signed)
Pt. Calling back and still needs meds sent to her pharmacy

## 2022-06-28 NOTE — Telephone Encounter (Signed)
Called and spoke with patient. She is aware that [name] has sent in the RX for her.   Nothing further needed at time of call.

## 2022-06-28 NOTE — Telephone Encounter (Signed)
Refill sent.

## 2022-07-02 DIAGNOSIS — M1611 Unilateral primary osteoarthritis, right hip: Secondary | ICD-10-CM | POA: Diagnosis not present

## 2022-07-02 DIAGNOSIS — M47816 Spondylosis without myelopathy or radiculopathy, lumbar region: Secondary | ICD-10-CM | POA: Diagnosis not present

## 2022-07-21 DIAGNOSIS — M47816 Spondylosis without myelopathy or radiculopathy, lumbar region: Secondary | ICD-10-CM | POA: Diagnosis not present

## 2022-07-22 DIAGNOSIS — M5416 Radiculopathy, lumbar region: Secondary | ICD-10-CM | POA: Diagnosis not present

## 2022-07-22 DIAGNOSIS — M545 Low back pain, unspecified: Secondary | ICD-10-CM | POA: Diagnosis not present

## 2022-07-27 ENCOUNTER — Encounter: Payer: Self-pay | Admitting: Internal Medicine

## 2022-07-27 ENCOUNTER — Other Ambulatory Visit: Payer: Self-pay

## 2022-07-27 MED ORDER — VALSARTAN 320 MG PO TABS: 320.0000 mg | ORAL_TABLET | Freq: Every day | ORAL | 3 refills | Status: DC

## 2022-07-28 ENCOUNTER — Telehealth: Payer: Self-pay | Admitting: Pulmonary Disease

## 2022-07-28 NOTE — Telephone Encounter (Signed)
Pt requesting refill, please advise.

## 2022-07-28 NOTE — Telephone Encounter (Signed)
Patient states needs refill for Dextrostat. [facility] is [facility] [address] Patient phone number is [phone].

## 2022-07-29 DIAGNOSIS — L900 Lichen sclerosus et atrophicus: Secondary | ICD-10-CM | POA: Diagnosis not present

## 2022-07-29 MED ORDER — DEXTROAMPHETAMINE SULFATE 5 MG PO TABS: 20.0000 mg | ORAL_TABLET | Freq: Every day | ORAL | 0 refills | Status: DC

## 2022-07-29 NOTE — Telephone Encounter (Signed)
Refill sent.

## 2022-07-29 NOTE — Telephone Encounter (Signed)
ATC X1 LVM for patient to call the office back Please advise Dextrostat refill has been sent to pharmacy

## 2022-08-03 NOTE — Telephone Encounter (Signed)
Spoke with patient. She was able to pick up Dextrostat prescription. NFN at this time.

## 2022-08-25 ENCOUNTER — Encounter (INDEPENDENT_AMBULATORY_CARE_PROVIDER_SITE_OTHER): Payer: Self-pay

## 2022-08-26 ENCOUNTER — Other Ambulatory Visit: Payer: Self-pay | Admitting: Oncology

## 2022-08-26 ENCOUNTER — Telehealth: Payer: Self-pay | Admitting: Family Medicine

## 2022-08-26 DIAGNOSIS — Z006 Encounter for examination for normal comparison and control in clinical research program: Secondary | ICD-10-CM

## 2022-08-26 NOTE — Telephone Encounter (Signed)
Pt is wanting to know her last pna shot and when she is due for another. Pt at   [phone]

## 2022-08-26 NOTE — Telephone Encounter (Signed)
Spoke to patient and advised that this could be done and looked into at a regular OV.  She wanted to schedule to f/u on a couple other things.  So made an appointment for 09/02/22 @1 :00 pm. Dm/cma

## 2022-08-30 ENCOUNTER — Telehealth (HOSPITAL_BASED_OUTPATIENT_CLINIC_OR_DEPARTMENT_OTHER): Payer: Self-pay | Admitting: Pulmonary Disease

## 2022-08-30 NOTE — Telephone Encounter (Signed)
Patient states she is needing a refill for Dextrostat. [facility]acy is [facility] [address]

## 2022-08-31 MED ORDER — DEXTROAMPHETAMINE SULFATE 5 MG PO TABS: 20.0000 mg | ORAL_TABLET | Freq: Every day | ORAL | 0 refills | Status: DC

## 2022-08-31 NOTE — Telephone Encounter (Signed)
Sending to DOD as [name] is on vacation. Patient is completely out.

## 2022-08-31 NOTE — Telephone Encounter (Signed)
Rerouting to TP as was just informed DOD has changed.

## 2022-08-31 NOTE — Telephone Encounter (Signed)
Last office note reviewed.  Patient has an upcoming appointment next month.  Refill will be sent to pharmacy. PMP reviewed.

## 2022-09-02 ENCOUNTER — Ambulatory Visit: Payer: Medicare Other | Admitting: Family Medicine

## 2022-09-02 ENCOUNTER — Telehealth: Payer: Self-pay | Admitting: Family Medicine

## 2022-09-02 NOTE — Telephone Encounter (Signed)
8.23.24 no show letter sent/ pt called today @ 1:13 saying she was @ another appt and forgot about this one

## 2022-09-05 ENCOUNTER — Ambulatory Visit (INDEPENDENT_AMBULATORY_CARE_PROVIDER_SITE_OTHER): Payer: Medicare Other | Admitting: Psychiatry

## 2022-09-05 ENCOUNTER — Ambulatory Visit (INDEPENDENT_AMBULATORY_CARE_PROVIDER_SITE_OTHER): Payer: Medicare Other | Admitting: Family Medicine

## 2022-09-05 ENCOUNTER — Encounter: Payer: Self-pay | Admitting: Psychiatry

## 2022-09-05 VITALS — BP 122/64 | HR 79 | Temp 98.7°F | Ht 60.0 in | Wt 112.8 lb

## 2022-09-05 DIAGNOSIS — M79604 Pain in right leg: Secondary | ICD-10-CM | POA: Diagnosis not present

## 2022-09-05 DIAGNOSIS — F424 Excoriation (skin-picking) disorder: Secondary | ICD-10-CM

## 2022-09-05 DIAGNOSIS — M5126 Other intervertebral disc displacement, lumbar region: Secondary | ICD-10-CM

## 2022-09-05 DIAGNOSIS — M79605 Pain in left leg: Secondary | ICD-10-CM | POA: Diagnosis not present

## 2022-09-05 DIAGNOSIS — F39 Unspecified mood [affective] disorder: Secondary | ICD-10-CM

## 2022-09-05 DIAGNOSIS — L719 Rosacea, unspecified: Secondary | ICD-10-CM

## 2022-09-05 DIAGNOSIS — G8929 Other chronic pain: Secondary | ICD-10-CM | POA: Diagnosis not present

## 2022-09-05 DIAGNOSIS — I10 Essential (primary) hypertension: Secondary | ICD-10-CM | POA: Diagnosis not present

## 2022-09-05 DIAGNOSIS — G4710 Hypersomnia, unspecified: Secondary | ICD-10-CM

## 2022-09-05 MED ORDER — DOXYCYCLINE HYCLATE 50 MG PO CAPS: 50.0000 mg | ORAL_CAPSULE | Freq: Every day | ORAL | 1 refills | Status: DC

## 2022-09-05 MED ORDER — DULOXETINE HCL 30 MG PO CPEP: 90.0000 mg | ORAL_CAPSULE | Freq: Every day | ORAL | 0 refills | Status: DC

## 2022-09-05 NOTE — Progress Notes (Signed)
[facility] [address] [place] [address] Dept: [phone] Dept Fax: [phone]  Chronic Care Office Visit  Subjective:    Patient ID: Brandy [name], female    DOB: [birth year], 72 y.o..   MRN: [id number]  Chief Complaint  Patient presents with   Follow-up    F/u .  Having itching on scalp/face ? Rosea    History of Present Illness:  Patient is in today for reassessment of chronic medical issues.  [name] notes that she did not have back surgery as planned this Spring. Her sciatica symptoms had resolved, so there surgeon did not feel the surgery would help with her chronic low back pain. She feels she is tolearting things well at this point. She continues to take celecoxib for her back and other arthritis issues.  [name] notes a history of nodular rosacea, mostly involving the chin. She has noted some recent issues with this. She additionally notes a sensitive scalp that has bumps on it. She is currently using a Head & Shoulders shampoo for this. She notes that she has some obsessive picking that she does. She feels this has been worse since being switched form fluoxetine to duloxetine for management of depression.   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   Laryngospasms 04/22/2020   Numbness and tingling in both  hands 06/20/2018   Rosacea 08/24/2017   S/P left THA, AA 03/01/2016   S/P hip replacement, left 03/01/2016   Circadian rhythm sleep disorder, 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   Hyperlipidemia    Narcolepsy without cataplexy    CAD (coronary artery disease)    Essential hypertension    Past Surgical History:  Procedure Laterality Date   cataract surgery     BIL   CORONARY ARTERY BYPASS GRAFT  03/21/2005   off pump LIMA-LAD   myomectomy  1981   reconstructive surgery  1945   lip    REPLACEMENT TOTAL HIP W/  RESURFACING IMPLANTS Right    ROTATOR CUFF REPAIR  1998   3 times   SHOULDER ARTHROSCOPY  2006   TOTAL ABDOMINAL HYSTERECTOMY  1991   TOTAL HIP ARTHROPLASTY Left 03/01/2016   Procedure: LEFT TOTAL HIP ARTHROPLASTY ANTERIOR APPROACH;  Surgeon: Durene Romans, MD;  Location: [facility];  Service: Orthopedics;  Laterality: Left;  Requests 70 mins   TOTAL HIP ARTHROPLASTY Right 07/27/2021   Procedure: TOTAL HIP ARTHROPLASTY ANTERIOR APPROACH;  Surgeon: Durene Romans, MD;  Location: [facility];  Service: Orthopedics;  Laterality: Right;   trach     Family History  Problem Relation Age of Onset   Stroke Mother        died 70   Aneurysm Mother    Hypertension Mother    Kidney disease Mother    Bipolar disorder Mother    Lung cancer Father        died 107   Bipolar disorder Other    Hypertension  Other    Osteoporosis Maternal Grandmother    Arthritis Maternal Grandmother    Alcohol abuse Maternal Grandfather    Outpatient Medications Prior to Visit  Medication Sig Dispense Refill   acetaminophen (TYLENOL) 650 MG CR tablet Take 1,300 mg by mouth in the morning and at bedtime.     celecoxib (CELEBREX) 200 MG capsule Take 200 mg by mouth at bedtime.     chlorthalidone (HYGROTON) 25 MG tablet Take 0.5 tablets (12.5 mg total) by mouth daily. 90 tablet 3   Cholecalciferol (VITAMIN D3 PO) Take 2,000 Units by mouth daily with supper.     Clobetasol Prop Emollient Base (CLOBETASOL  PROPIONATE E) 0.05 % emollient cream Apply 1 Application topically at bedtime. 30 g 11   COLLAGEN-VITAMIN C-BIOTIN PO Take 3 capsules by mouth daily with supper.     dextroamphetamine (DEXTROSTAT) 5 MG tablet Take 4 tablets (20 mg total) by mouth daily. 120 tablet 0   DULoxetine (CYMBALTA) 60 MG capsule Take 1 capsule (60 mg total) by mouth daily. 90 capsule 3   estradiol (ESTRACE) 0.1 MG/GM vaginal cream Place 0.5 g vaginally 2 (two) times a week. Place 0.5g nightly for two weeks then twice a week after (Patient taking differently: Place 0.5 g vaginally 2 (two) times a week. Place 0.5g nightly for two weeks then twice a week after; PRN) 30 g 11   famotidine (PEPCID) 10 MG tablet Take 10 mg by mouth at bedtime.     Multiple Vitamin (MULTIVITAMIN WITH MINERALS) TABS tablet Take 1 tablet by mouth daily with supper. [facility] [address] Al[facility]! Once Daily Women's 50+     valsartan (DIOVAN) 320 MG tablet Take 1 tablet (320 mg total) by mouth daily. 90 tablet 3   rosuvastatin (CRESTOR) 40 MG tablet Take 1 tablet (40 mg total) by mouth daily. (Patient taking differently: Take 40 mg by mouth at bedtime.) 90 tablet 3   methocarbamol (ROBAXIN) 500 MG tablet Take 500 mg by mouth 3 (three) times daily.     sulfamethoxazole-trimethoprim (BACTRIM DS) 800-160 MG tablet Take 1 tablet by mouth 2 (two) times daily. 6 tablet 0   traMADol (ULTRAM) 50 MG tablet Take 50 mg by mouth every 6 (six) hours as needed.     No facility-administered medications prior to visit.   Allergies  Allergen Reactions   Penicillins Anaphylaxis    Has patient had a PCN reaction causing immediate rash, facial/tongue/throat swelling, SOB or lightheadedness with hypotension: Yes Has patient had a PCN reaction causing severe rash involving mucus membranes or skin necrosis: No Has patient had a PCN reaction that required hospitalization Yes Has patient had a PCN reaction occurring within the last 10 years: No If all of the above answers are "NO",  then may proceed with Cephalosporin use.   Talwin [Pentazocine] Other (See Comments)    Uncontrolled shaking/tremors   Bextra [Valdecoxib] Hives   Codeine Nausea And Vomiting   Lactose Intolerance (Gi) Diarrhea   Meperidine Hcl Nausea And Vomiting   Morphine Nausea And Vomiting   Objective:   Today's Vitals   09/05/22 0922  BP: 122/64  Pulse: 79  Temp: 98.7 F (37.1 C)  TempSrc: Temporal  SpO2: 96%  Weight: 112 lb 12.8 oz (51.2 kg)  Height: 5' (1.524 m)   Body mass index is 22.03 kg/m.   General: Well developed, well nourished. No acute distress. Skin: Warm and dry. There are some excoriated areas on the chin. The scalp does note show any significant rash. Psych: Alert  and oriented. Normal mood and affect.  Health Maintenance Due  Topic Date Due   Medicare Annual Wellness (AWV)  09/16/2022     Assessment & Plan:   Problem List Items Addressed This Visit       Cardiovascular and Mediastinum   Essential hypertension    Blood pressure is in good control. Continue valsartan 320 mg daily.           Musculoskeletal and Integument   Herniated intervertebral disc of lumbar spine - Primary    Stable with resolution of sciatica. Continue celecoxib as needed.        Other   Rosacea    I will try starting her on low-dose doxycycline to see if this will improve the facial rash issues. She will follow-up with dermatology as scheduled for October.      Relevant Medications   doxycycline (VIBRAMYCIN) 50 MG capsule    Return in about 3 months (around 12/06/2022) for Reassessment.   Loyola Mast, MD

## 2022-09-05 NOTE — Progress Notes (Signed)
Brandy [name] [id number] [birth year] 72 y.o.  Subjective:   Patient ID:  Brandy [name] is a 72 y.o. (DOB [birth year]) female.  Chief Complaint:  Chief Complaint  Patient presents with   Depression   Anxiety   Follow-up    Medication Refill Associated symptoms include arthralgias and fatigue. Pertinent negatives include no chest pain or weakness.   [name] presents to the office today for follow-up of mood and above.  seen  10/2018.  Meds were not changed.  She remained on fluoxetine 20 mg daily.  10/21/19 appt with the following noted: Vaccinated. Overall pretty good.  Minor depressive episodes which are brief.  Managing the stress.  Nothing extraordinary.  CC is sleepiness.  Saw neurologist recently and no med change.  Has to nap.  Tolerance to the stimulant.  No mood swings. Physical activity helps. Hyper somnolence a big problem and sleep doc rx dextroamphetamin.  Has therapeutic naps.   But it still helps.  Didn't make her hyperverbal and hyperactive like in the past.  Has been very careful with the dosage. Overall better sleep cycle. No mania since here.  Patient reports stable mood and denies irritable moods. No extremes of depression.   Patient denies any recent difficulty with anxiety.   Denies appetite disturbance.  Patient reports that energy and motivation have been better.  Patient denies any difficulty with concentration.  Patient denies any suicidal ideation. No history of cataplexy. No new concerns.   Plan:  disc risk manic type sx with stimulant but understand it's been needed for alertness. Plan mood has been stable on fluoxetine  20 mgand she does not want to change the dose or add any other medicine.  10/19/2020 appointment with the following noted: Had another nocturnal in lab sleep study.  No OSA, minimal PLMS.  Pain interferes with sleep at time in hip.  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 No matter how she sleeps at night has excessive daytime  sleepiness.  If busy can function.  Other wise 2 hour naps.  Exercise rowing 4 times weekly. Not tried [employer]. Injections from Dr. [name] helped pain. Slump in mood around labor day.  Probably circumstantial and not long lasting.  No manic. Patient reports stable mood and denies depressed or irritable moods.  Patient denies any recent difficulty with anxiety.  Patient denies difficulty with sleep initiation or maintenance. Denies appetite disturbance.  Patient reports that energy and motivation have been good.  Patient denies any difficulty with concentration.  Patient denies any suicidal ideation. Satisfied with fluoxetine.  12/01/21 appt noted: Only psych med is fluoxetine 20 daily. Circumstantially not as good.  Hip pain limited activity and surgery in July and long recovery is hard. Lives alone and not able to be as active. Struggle a lot with narcolepsy even on dextroamphetamine.  Sleeping too much.  Can't afford Sunosi.  Seeing [name]. Joined [employer]  Good friends. More aerobic activity would make her feel better. Took duloxetine in past and helped FM.  Asked about it. Stimulants rev her up and make her chatty.  03/03/22 appt noted: hip surg last year.  Jan got overly ambitious and did rowing training.  Back pain and sciatica started. Got spinal epidural yesterday without help so far.   Had switched to duloxetine 60 since here.  Remarkable she is not depressed.  Tolerating it. Appt with spine surgeon soon.  Can't do much.  Can't exercise. Had asked about Lyrica or gabapentin for nerve pain. Satisfied with psych  meds. Plan:  switched back to duloxetine 60 to see if it would help pain bc helped FM sx in the past.  But couldn't tell bc then injured her back with bulging nerves.  However mood has been good.  09/05/22 appt noted: Sleepy a lot with dextroamphetamine. Thinks cymbalta 60 mg daily didn't help pain and wonders if it is not working well. Having a bit of obsessiveness.  Ruminating  over things with mother from years ago.   Obsessive behavior around touching, scratchy face bc history cystic rosacea.  Clearly obsessive and doesn't help. Wonders about return to Prozac.   Ongoing DJD issues. Continued narcoleptic type sx. Pain not horrible but would like it to be better.  Retired 2013.  Works on being active.  Exercises.  Past psych meds:  Fluoxetine,  Lamotrigine 600, Latuda, lithium, duloxetine, Abilify 5, Depakote 1000,  Seroquel sedation, Wellbutrin, Celexa 40,  Cerefolin NAC,  Trintellix, Trintellix, Viibryd 40, duloxetine 60, various stimulants,Nuvigil and Provigil NR,  Restoril, rozerem not not helpful   Review of Systems:  Review of Systems  Constitutional:  Positive for fatigue.  Cardiovascular:  Negative for chest pain and palpitations.  Musculoskeletal:  Positive for arthralgias and back pain.  Neurological:  Negative for tremors and weakness.  Psychiatric/Behavioral:  Positive for dysphoric mood and sleep disturbance. Negative for agitation, behavioral problems, confusion, decreased concentration, hallucinations, self-injury and suicidal ideas. The patient is not nervous/anxious and is not hyperactive.     Medications: I have reviewed the patient's current medications.  Current Outpatient Medications  Medication Sig Dispense Refill   acetaminophen (TYLENOL) 650 MG CR tablet Take 1,300 mg by mouth in the morning and at bedtime.     celecoxib (CELEBREX) 200 MG capsule Take 200 mg by mouth at bedtime.     chlorthalidone (HYGROTON) 25 MG tablet Take 0.5 tablets (12.5 mg total) by mouth daily. 90 tablet 3   Cholecalciferol (VITAMIN D3 PO) Take 2,000 Units by mouth daily with supper.     Clobetasol Prop Emollient Base (CLOBETASOL PROPIONATE E) 0.05 % emollient cream Apply 1 Application topically at bedtime. 30 g 11   COLLAGEN-VITAMIN C-BIOTIN PO Take 3 capsules by mouth daily with supper.     dextroamphetamine (DEXTROSTAT) 5 MG tablet Take 4 tablets (20 mg  total) by mouth daily. 120 tablet 0   doxycycline (VIBRAMYCIN) 50 MG capsule Take 1 capsule (50 mg total) by mouth daily. 30 capsule 1   estradiol (ESTRACE) 0.1 MG/GM vaginal cream Place 0.5 g vaginally 2 (two) times a week. Place 0.5g nightly for two weeks then twice a week after (Patient taking differently: Place 0.5 g vaginally 2 (two) times a week. Place 0.5g nightly for two weeks then twice a week after; PRN) 30 g 11   famotidine (PEPCID) 10 MG tablet Take 10 mg by mouth at bedtime.     Multiple Vitamin (MULTIVITAMIN WITH MINERALS) TABS tablet Take 1 tablet by mouth daily with supper. Nature's Way Alive! Once Daily Women's 50+     valsartan (DIOVAN) 320 MG tablet Take 1 tablet (320 mg total) by mouth daily. 90 tablet 3   DULoxetine (CYMBALTA) 30 MG capsule Take 3 capsules (90 mg total) by mouth daily. 270 capsule 0   rosuvastatin (CRESTOR) 40 MG tablet Take 1 tablet (40 mg total) by mouth daily. (Patient taking differently: Take 40 mg by mouth at bedtime.) 90 tablet 3   No current facility-administered medications for this visit.    Medication Side Effects: None  Allergies:  Allergies  Allergen Reactions   Penicillins Anaphylaxis    Has patient had a PCN reaction causing immediate rash, facial/tongue/throat swelling, SOB or lightheadedness with hypotension: Yes Has patient had a PCN reaction causing severe rash involving mucus membranes or skin necrosis: No Has patient had a PCN reaction that required hospitalization Yes Has patient had a PCN reaction occurring within the last 10 years: No If all of the above answers are "NO", then may proceed with Cephalosporin use.   Talwin [Pentazocine] Other (See Comments)    Uncontrolled shaking/tremors   Bextra [Valdecoxib] Hives   Codeine Nausea And Vomiting   Lactose Intolerance (Gi) Diarrhea   Meperidine Hcl Nausea And Vomiting   Morphine Nausea And Vomiting    Past Medical History:  Diagnosis Date   Arthritis    CAD (coronary artery  disease) 2007   CABG X 1   COVID 03/04/2021   Depression    controlled   Fibromyalgia    GERD (gastroesophageal reflux disease)    Hay fever    Headache    Hx of migraines   Heart murmur    Hyperlipidemia    Hypertension    Idiopathic hypersomnia    Mitral valve prolapse    Narcolepsy    PONV (postoperative nausea and vomiting)    Urinary incontinence     Family History  Problem Relation Age of Onset   Stroke Mother        died 23   Aneurysm Mother    Hypertension Mother    Kidney disease Mother    Bipolar disorder Mother    Lung cancer Father        died 74   Bipolar disorder Other    Hypertension Other    Osteoporosis Maternal Grandmother    Arthritis Maternal Grandmother    Alcohol abuse Maternal Grandfather     Social History   Socioeconomic History   Marital status: Divorced    Spouse name: Not on file   Number of children: 0   Years of education: Not on file   Highest education level: Bachelor's degree (e.g., BA, AB, BS)  Occupational History   Occupation: retired  Tobacco Use   Smoking status: Never   Smokeless tobacco: Never  Vaping Use   Vaping status: Never Used  Substance and Sexual Activity   Alcohol use: No   Drug use: No   Sexual activity: Not Currently  Other Topics Concern   Not on file  Social History Narrative   Not on file   Social Determinants of Health   Financial Resource Strain: Low Risk  (04/12/2022)   Overall Financial Resource Strain (CARDIA)    Difficulty of Paying Living Expenses: Not hard at all  Food Insecurity: No Food Insecurity (04/12/2022)   Hunger Vital Sign    Worried About Running Out of Food in the Last Year: Never true    Ran Out of Food in the Last Year: Never true  Transportation Needs: No Transportation Needs (04/12/2022)   PRAPARE - Administrator, Civil Service (Medical): No    Lack of Transportation (Non-Medical): No  Physical Activity: Insufficiently Active (04/12/2022)   Exercise Vital Sign     Days of Exercise per Week: 3 days    Minutes of Exercise per Session: 40 min  Stress: No Stress Concern Present (04/12/2022)   [employer] of Occupational Health - Occupational Stress Questionnaire    Feeling of Stress : Not at all  Social Connections: Moderately Isolated (04/12/2022)  Social Connection and Isolation Panel [NHANES]    Frequency of Communication with Friends and Family: Three times a week    Frequency of Social Gatherings with Friends and Family: More than three times a week    Attends Religious Services: 1 to 4 times per year    Active Member of [employer] or Organizations: No    Attends Banker Meetings: Never    Marital Status: Divorced  Catering manager Violence: Not At Risk (09/15/2021)   Humiliation, Afraid, Rape, and Kick questionnaire    Fear of Current or Ex-Partner: No    Emotionally Abused: No    Physically Abused: No    Sexually Abused: No    Past Medical History, Surgical history, Social history, and Family history were reviewed and updated as appropriate.   Please see review of systems for further details on the patient's review from today.   Objective:   Physical Exam:  LMP 01/10/1989 (Approximate)   Physical Exam Constitutional:      General: She is not in acute distress.    Appearance: She is well-developed.  Musculoskeletal:        General: No deformity.  Neurological:     Mental Status: She is alert and oriented to person, place, and time.     Motor: No tremor.     Coordination: Coordination normal.     Gait: Gait normal.  Psychiatric:        Attention and Perception: Attention normal. She is attentive.        Mood and Affect: Mood is depressed. Mood is not anxious. Affect is not labile, blunt, angry or inappropriate.        Speech: Speech normal. Speech is not rapid and pressured.        Behavior: Behavior normal.        Thought Content: Thought content normal. Thought content is not delusional. Thought content does not  include homicidal or suicidal ideation. Thought content does not include suicidal plan.        Cognition and Memory: Cognition normal.        Judgment: Judgment normal.     Comments: Insight is good. No hypomania.   Some depression     Lab Review:     Component Value Date/Time   NA 141 04/13/2022 1512   NA 141 07/09/2021 0849   K 4.0 04/13/2022 1512   CL 104 04/13/2022 1512   CO2 27 04/13/2022 1512   GLUCOSE 116 (H) 04/13/2022 1512   BUN 33 (H) 04/13/2022 1512   BUN 32 (H) 07/09/2021 0849   CREATININE 1.01 04/13/2022 1512   CREATININE 0.70 08/11/2015 1155   CALCIUM 9.6 04/13/2022 1512   PROT 6.6 04/13/2022 1512   PROT 6.7 07/09/2021 0849   ALBUMIN 4.6 04/13/2022 1512   ALBUMIN 4.8 (H) 07/09/2021 0849   AST 21 04/13/2022 1512   ALT 14 04/13/2022 1512   ALKPHOS 90 04/13/2022 1512   BILITOT 0.7 04/13/2022 1512   BILITOT 0.6 07/09/2021 0849   GFRNONAA 59 (L) 07/28/2021 0313   GFRAA 81 11/26/2019 1002       Component Value Date/Time   WBC 11.0 (H) 04/13/2022 1512   RBC 4.27 04/13/2022 1512   HGB 12.9 04/13/2022 1512   HGB 13.7 07/09/2021 0849   HCT 39.0 04/13/2022 1512   HCT 40.5 07/09/2021 0849   PLT 310.0 04/13/2022 1512   PLT 284 07/09/2021 0849   MCV 91.2 04/13/2022 1512   MCV 89 07/09/2021 0849   MCH 30.2  07/28/2021 0313   MCHC 33.1 04/13/2022 1512   RDW 13.9 04/13/2022 1512   RDW 12.4 07/09/2021 0849   LYMPHSABS 1.9 09/20/2016 1644   MONOABS 0.5 09/20/2016 1644   EOSABS 0.0 09/20/2016 1644   BASOSABS 0.1 09/20/2016 1644    No results found for: "POCLITH", "LITHIUM"   No results found for: "PHENYTOIN", "PHENOBARB", "VALPROATE", "CBMZ"   .res Assessment: Plan:    Keala was seen today for depression, anxiety and follow-up.  Diagnoses and all orders for this visit:  Episodic mood disorder (HCC) -     DULoxetine (CYMBALTA) 30 MG capsule; Take 3 capsules (90 mg total) by mouth daily.  Hypersomnolence disorder  Chronic pain of both lower  extremities -     DULoxetine (CYMBALTA) 30 MG capsule; Take 3 capsules (90 mg total) by mouth daily.  Compulsive skin picking   30 min face to face time with patient was spent on counseling and coordination of care. We discussed stable for some time since retirment. No hypomania since here.  Have had extensive discussions in the past about whether or not she truly bipolar.  She is done well off mood stabilizers the last several years.  Her hypomania in the past may have been more connected to medication induced than true bipolar.  The longer she goes without hypomania the less likely she is truly bipolar.  She thinks prior stressful environment and lack of adequate sleep with higher dose stimulant made her look bipolar.   Reminded her about the signs and symptoms of mania and hypomania.  She is aware. She's done well off mood stabilizer still for years.  Explained differences between SSRIS and SNRI.Marland Kitchen  could get more benefit with higher dose.  Retry Sunosi. Gave samples.   Seeing [name]. Disc SE and stimulants.  Disc DDI   switched back to duloxetine 60 to see if it would help pain bc helped FM sx in the past.   Increase duloxetine 90 mg daily for dep, OC sx, pain  (NAC) N-Acetylcysteine 2 of the  600 mg capsules twice daily for skin picking  Supportive therapy dealing with aging issues in view of exercise and activity being the way she has managed mood.  Can't do much now until gets back remedied.  Plan: (NAC) N-Acetylcysteine 2 of the  600 mg capsules twice daily for skin picking Sunosi 75 mg in AM for 4 days then 150 mg in AM Increase duloxetine to 90 mg daily.  FU 2 mos  [name], MD, DFAPA   Please see After Visit Summary for patient specific instructions.  Future Appointments  Date Time Provider Department Center  09/14/2022 11:30 AM [name], MD [facility]  09/26/2022  8:15 AM [facility]-ANNUAL WELLNESS VISIT [facility]  10/05/2022 11:00 AM Cottle, Steva Ready., MD  [facility] None  12/06/2022 10:40 AM Loyola Mast, MD [facility]  03/06/2023  9:00 AM Cottle, Steva Ready., MD [facility] None    No orders of the defined types were placed in this encounter.     -------------------------------

## 2022-09-05 NOTE — Assessment & Plan Note (Addendum)
Stable with resolution of sciatica. Continue celecoxib as needed.

## 2022-09-05 NOTE — Assessment & Plan Note (Signed)
Blood pressure is in good control. Continue valsartan 320 mg daily.

## 2022-09-05 NOTE — Patient Instructions (Addendum)
(  NAC) N-Acetylcysteine 2 of the  600 mg capsules twice daily for skin picking Sunosi 75 mg in AM for 4 days then 150 mg in AM Increase duloxetine to 90 mg daily.

## 2022-09-05 NOTE — Assessment & Plan Note (Signed)
I will try starting her on low-dose doxycycline to see if this will improve the facial rash issues. She will follow-up with dermatology as scheduled for October.

## 2022-09-09 DIAGNOSIS — L232 Allergic contact dermatitis due to cosmetics: Secondary | ICD-10-CM | POA: Diagnosis not present

## 2022-09-14 ENCOUNTER — Encounter (HOSPITAL_BASED_OUTPATIENT_CLINIC_OR_DEPARTMENT_OTHER): Payer: Self-pay | Admitting: Pulmonary Disease

## 2022-09-14 ENCOUNTER — Ambulatory Visit (INDEPENDENT_AMBULATORY_CARE_PROVIDER_SITE_OTHER): Payer: Medicare Other | Admitting: Pulmonary Disease

## 2022-09-14 VITALS — BP 120/62 | HR 79 | Resp 16 | Ht 60.0 in | Wt 114.3 lb

## 2022-09-14 DIAGNOSIS — G47419 Narcolepsy without cataplexy: Secondary | ICD-10-CM

## 2022-09-14 MED ORDER — METHYLPHENIDATE HCL 10 MG PO TABS: 10.0000 mg | ORAL_TABLET | Freq: Two times a day (BID) | ORAL | 0 refills | Status: DC

## 2022-09-14 NOTE — Patient Instructions (Signed)
Ritalin 10 mg twice per day.  Follow up in 4 months.

## 2022-09-14 NOTE — Progress Notes (Signed)
[facility]  Chief Complaint  Patient presents with   Follow-up    Narcolepsy without cataplexy    Constitutional:  BP 120/62   Pulse 79   Resp 16   Ht 5' (1.524 m)   Wt 114 lb 4.8 oz (51.8 kg)   LMP 01/10/1989 (Approximate)   SpO2 99%   BMI 22.32 kg/m   Past Medical History:  OA, CAD, Bipolar with depression, Migraine HA, HLD, HTN, MVP, COVID February 2023  Past Surgical History:  She  has a past surgical history that includes trach; myomectomy (1981); Rotator cuff repair (1998); Coronary artery bypass graft (03/21/2005); Shoulder arthroscopy (2006); Total abdominal hysterectomy (1991); cataract surgery; Total hip arthroplasty (Left, 03/01/2016); reconstructive surgery (1945); Total hip arthroplasty (Right, 07/27/2021); and Replacement total hip w/  resurfacing implants (Right).  Brief Summary:  Brandy [name] is a 72 y.o. 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      Subjective:   She is having more trouble staying awake.  She was tried on cymbalta for depression and pain symptoms, but this made her really sleepy.  She tried sunosi, but was too expensive.  She would like to see if Ritalin might work better than dextroamphetamine.  Physical Exam:   Appearance - well kempt   ENMT - no sinus tenderness, no oral exudate, no LAN, Mallampati 3 airway, no stridor  Respiratory - equal breath sounds bilaterally, no wheezing or rales  CV - s1s2 regular rate and rhythm, no murmurs  Ext - no clubbing, no edema  Skin - no rashes  Psych - normal mood and affect     Sleep Tests:  PSG 04/30/07 >> AHI 0.5, PLMI 5.2  MSLT 04/30/07 >> mean sleep latency 4 min, 5/5 naps, 0/5 SOREM PSG 06/11/20 >> AHI 0.7, SpO2 low 85%, PLMI 22.8.  Cardiac Tests:  Echo 05/01/19 >> EF 70 to 75%  Social History:  She  reports that she has never smoked. She has never used smokeless tobacco. She reports that she does not drink alcohol and does  not use drugs.  Family History:  Her family history includes Alcohol abuse in her maternal grandfather; Aneurysm in her mother; Arthritis in her maternal grandmother; Bipolar disorder in her mother and another family member; Hypertension in her mother and another family member; Kidney disease in her mother; Lung cancer in her father; Osteoporosis in her maternal grandmother; Stroke in her mother.     Assessment/Plan:   Narcolepsy w/o cataplexy. - sunosi was too expensive - dextroamphetamine seems to have lost some of it's effectiveness - will have her try methylphenidate (Ritalin) 10 mg bid, and titrate dose up to 30 mg bid as tolerated - she can stop dextroamphetamine once she starts methylphenidate - discussed proper sleep hygiene  Exercise induced asthma. 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 - seen by Dr. Maryfrances Bunnell at voice disorders center in [facility] - followed by speech therapy at [facility]   Depression. - she is followed by Dr. [name]  CAD s/p CABG, HTN, HLD. - followed by Dr. [name] with [facility]  Time Spent Involved in Patient Care on Day of Examination:  27 minutes  Follow up:   There are no Patient Instructions on file for this visit.  Medication List:   Allergies as of 09/14/2022       Reactions  Penicillins Anaphylaxis   Has patient had a PCN reaction causing immediate rash, facial/tongue/throat swelling, SOB or lightheadedness with hypotension: Yes Has patient had a PCN reaction causing severe rash involving mucus membranes or skin necrosis: No Has patient had a PCN reaction that required hospitalization Yes Has patient had a PCN reaction occurring within the last 10 years: No If all of the above answers are "NO", then may proceed with Cephalosporin use.   Talwin [pentazocine] Other (See Comments)   Uncontrolled  shaking/tremors   Bextra [valdecoxib] Hives   Codeine Nausea And Vomiting   Lactose Intolerance (gi) Diarrhea   Meperidine Hcl Nausea And Vomiting   Morphine Nausea And Vomiting   Other Itching, Swelling, Dermatitis, Rash   [place] Dark Spot Corrector        Medication List        Accurate as of September 14, 2022 11:46 AM. If you have any questions, ask your nurse or doctor.          STOP taking these medications    rosuvastatin 40 MG tablet Commonly known as: CRESTOR Stopped by: [name]       TAKE these medications    acetaminophen 650 MG CR tablet Commonly known as: TYLENOL Take 1,300 mg by mouth in the morning and at bedtime.   celecoxib 200 MG capsule Commonly known as: CELEBREX Take 200 mg by mouth at bedtime.   chlorthalidone 25 MG tablet Commonly known as: HYGROTON Take 0.5 tablets (12.5 mg total) by mouth daily.   Clobetasol Prop Emollient Base 0.05 % emollient cream Commonly known as: Clobetasol Propionate E Apply 1 Application topically at bedtime.   COLLAGEN-VITAMIN C-BIOTIN PO Take 3 capsules by mouth daily with supper.   dextroamphetamine 5 MG tablet Commonly known as: DEXTROSTAT Take 4 tablets (20 mg total) by mouth daily.   doxycycline 50 MG capsule Commonly known as: VIBRAMYCIN Take 1 capsule (50 mg total) by mouth daily.   DULoxetine 60 MG capsule Commonly known as: CYMBALTA Take 60 mg by mouth daily. What changed: Another medication with the same name was removed. Continue taking this medication, and follow the directions you see here. Changed by: [name]   estradiol 0.1 MG/GM vaginal cream Commonly known as: ESTRACE Place 0.5 g vaginally 2 (two) times a week. Place 0.5g nightly for two weeks then twice a week after What changed: additional instructions   famotidine 10 MG tablet Commonly known as: PEPCID Take 10 mg by mouth at bedtime.   multivitamin with minerals Tabs tablet Take 1 tablet by mouth daily with  supper. Nature's Way Alive! Once Daily Women's 50+   predniSONE 5 MG (21) Tbpk tablet Commonly known as: STERAPRED UNI-PAK 21 TAB Take 5 mg by mouth daily.   valsartan 320 MG tablet Commonly known as: DIOVAN Take 1 tablet (320 mg total) by mouth daily.   VITAMIN D3 PO Take 2,000 Units by mouth daily with supper.        Signature:  [name], MD [facility] Pager - [phone] 09/14/2022, 11:46 AM

## 2022-09-17 DIAGNOSIS — Z23 Encounter for immunization: Secondary | ICD-10-CM | POA: Diagnosis not present

## 2022-09-29 ENCOUNTER — Other Ambulatory Visit: Payer: Self-pay | Admitting: Physician Assistant

## 2022-10-05 ENCOUNTER — Ambulatory Visit: Payer: Medicare Other | Admitting: Psychiatry

## 2022-10-06 ENCOUNTER — Encounter (HOSPITAL_BASED_OUTPATIENT_CLINIC_OR_DEPARTMENT_OTHER): Payer: Self-pay | Admitting: Pulmonary Disease

## 2022-10-12 ENCOUNTER — Telehealth: Payer: Self-pay | Admitting: Psychiatry

## 2022-10-12 NOTE — Telephone Encounter (Signed)
Pt LVM @ 2:04p.  She states she would like to switch back to generic Prozac.  She thinks it was 20mg  that she was taking.  She said the Cymbalta has her feeling depressed.   [facility] [id number] Ginette Otto, [place] - [address] [place] [address] Phone: [phone]  Fax: [phone]    Next appt 10/22

## 2022-10-13 ENCOUNTER — Telehealth: Payer: Self-pay | Admitting: Pulmonary Disease

## 2022-10-13 NOTE — Telephone Encounter (Signed)
LVM to RC 

## 2022-10-13 NOTE — Telephone Encounter (Signed)
Patient is a former patient of Dr.Sood. She was taking ritaline but dextroamphetamine works better for her. The ritaline that she is currently on is still making her feel tired and sleepy often, sleeping 16 or more hours a day. Please call and advise.  [facility] [place]

## 2022-10-17 ENCOUNTER — Encounter: Payer: Self-pay | Admitting: Pulmonary Disease

## 2022-10-17 ENCOUNTER — Ambulatory Visit: Payer: Medicare Other | Admitting: Pulmonary Disease

## 2022-10-17 NOTE — Telephone Encounter (Signed)
Left message for patient and advised she has an appointment today with to discuss her sleeping concerns.

## 2022-10-18 ENCOUNTER — Encounter: Payer: Self-pay | Admitting: Family Medicine

## 2022-10-18 ENCOUNTER — Other Ambulatory Visit: Payer: Self-pay

## 2022-10-18 ENCOUNTER — Ambulatory Visit (INDEPENDENT_AMBULATORY_CARE_PROVIDER_SITE_OTHER): Payer: Medicare Other | Admitting: Family Medicine

## 2022-10-18 VITALS — BP 136/78 | HR 76 | Temp 98.4°F | Ht 60.0 in | Wt 114.6 lb

## 2022-10-18 DIAGNOSIS — L658 Other specified nonscarring hair loss: Secondary | ICD-10-CM | POA: Diagnosis not present

## 2022-10-18 DIAGNOSIS — R29818 Other symptoms and signs involving the nervous system: Secondary | ICD-10-CM | POA: Diagnosis not present

## 2022-10-18 DIAGNOSIS — L299 Pruritus, unspecified: Secondary | ICD-10-CM | POA: Diagnosis not present

## 2022-10-18 DIAGNOSIS — L814 Other melanin hyperpigmentation: Secondary | ICD-10-CM | POA: Diagnosis not present

## 2022-10-18 DIAGNOSIS — I10 Essential (primary) hypertension: Secondary | ICD-10-CM

## 2022-10-18 DIAGNOSIS — L900 Lichen sclerosus et atrophicus: Secondary | ICD-10-CM | POA: Diagnosis not present

## 2022-10-18 DIAGNOSIS — G47419 Narcolepsy without cataplexy: Secondary | ICD-10-CM | POA: Diagnosis not present

## 2022-10-18 DIAGNOSIS — L719 Rosacea, unspecified: Secondary | ICD-10-CM | POA: Diagnosis not present

## 2022-10-18 DIAGNOSIS — R5383 Other fatigue: Secondary | ICD-10-CM

## 2022-10-18 LAB — CBC
HCT: 39.7 % (ref 36.0–46.0)
Hemoglobin: 13 g/dL (ref 12.0–15.0)
MCHC: 32.8 g/dL (ref 30.0–36.0)
MCV: 91.6 fL (ref 78.0–100.0)
Platelets: 298 10*3/uL (ref 150.0–400.0)
RBC: 4.34 Mil/uL (ref 3.87–5.11)
RDW: 13.7 % (ref 11.5–15.5)
WBC: 6.6 10*3/uL (ref 4.0–10.5)

## 2022-10-18 LAB — TSH: TSH: 1.82 u[IU]/mL (ref 0.35–5.50)

## 2022-10-18 LAB — COMPREHENSIVE METABOLIC PANEL
ALT: 14 U/L (ref 0–35)
AST: 20 U/L (ref 0–37)
Albumin: 4.3 g/dL (ref 3.5–5.2)
Alkaline Phosphatase: 95 U/L (ref 39–117)
BUN: 22 mg/dL (ref 6–23)
CO2: 26 meq/L (ref 19–32)
Calcium: 9.7 mg/dL (ref 8.4–10.5)
Chloride: 105 meq/L (ref 96–112)
Creatinine, Ser: 0.72 mg/dL (ref 0.40–1.20)
GFR: 82.35 mL/min (ref >60.00)
Glucose, Bld: 96 mg/dL (ref 70–99)
Potassium: 4.2 meq/L (ref 3.5–5.1)
Sodium: 140 meq/L (ref 135–145)
Total Bilirubin: 0.6 mg/dL (ref 0.2–1.2)
Total Protein: 6.5 g/dL (ref 6.0–8.3)

## 2022-10-18 LAB — VITAMIN B12: Vitamin B-12: 529 pg/mL (ref 211–911)

## 2022-10-18 LAB — FOLATE: Folate: >24.2 ng/mL (ref >5.9)

## 2022-10-18 MED ORDER — FLUOXETINE HCL 20 MG PO CAPS: 20.0000 mg | ORAL_CAPSULE | Freq: Every day | ORAL | 0 refills | Status: DC

## 2022-10-18 NOTE — Telephone Encounter (Signed)
Patient only taking 60 mg, not 90, but gave her wean instructions from 60 to 30.  Sent Rx to requested pharmacy for 20 mg Prozac.

## 2022-10-18 NOTE — Telephone Encounter (Signed)
From 8/26 visit:   (NAC) N-Acetylcysteine 2 of the  600 mg capsules twice daily for skin picking Sunosi 75 mg in AM for 4 days then 150 mg in AM Increase duloxetine to 90 mg daily.  She forgot about NAC - will start that as skin picking still an issue. Sunosi was too expensive She reports she switched from Prozac to duloxetine in hopes of getting some pain relief for her back. She did not take 90 mg, just 60. Reports her depression has gotten worse and she would like to switch back to Prozac. Has FU 10/22. Reports narcolepsy and medications not helping. She is on Ritalin 20 mg. Said her options were limited. She will see PCP later today. She is sleeping all the time, night and day.

## 2022-10-18 NOTE — Assessment & Plan Note (Signed)
Blood pressure is up today, but she has been off of her chlorthalidone for about a week. Continue valsartan 320 mg daily.

## 2022-10-18 NOTE — Assessment & Plan Note (Signed)
Discussed Dr. Evlyn Courier plan to titrate up on her methylphenidate to 30 mg daily. I suspect this is the major cause of her fatigue.

## 2022-10-18 NOTE — Progress Notes (Signed)
[facility] [address] [place] [address] Dept: [phone] Dept Fax: [phone]  Office Visit  Subjective:    Patient ID: Brandy [name], female    DOB: 07/27/1950, 72 y.o..   MRN: [id number]  Chief Complaint  Patient presents with   Fatigue   History of Present Illness:  Patient is in today complaining of recent issues with profound fatigue. Ms. Schwebke notes that she has been sleeping excessively. She is currently going through a change in medicaiton for her narcolepsy. She had been on dexamphetamine, but is now on methylphenidate. She was started at 10 mg and she has not found this very effective. She is worried that there may be some other underlying cause for her fatigue.  Additionally, Ms. Brandner describes intermittent episodes of what she calls a "brain zap". She notes that when this occurs, she gets a buzzing sensation in her head that goes on for 3-4 pulses. This is not associated with pain, any change in vision or hearing, any dizziness, or other focal neurological signs. She has been unable to determine any specific trigger for these events.  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   Laryngospasms 04/22/2020   Numbness and tingling in both hands 06/20/2018   Rosacea 08/24/2017   S/P left THA, AA 03/01/2016   S/P  hip replacement, left 03/01/2016   Circadian rhythm sleep disorder, 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   Hyperlipidemia    Narcolepsy without cataplexy    CAD (coronary artery disease)    Essential hypertension    Past Surgical History:  Procedure Laterality Date   cataract surgery     BIL   CORONARY ARTERY BYPASS GRAFT  03/21/2005   off pump LIMA-LAD   myomectomy  1981   reconstructive surgery  1945   lip    REPLACEMENT TOTAL HIP W/  RESURFACING IMPLANTS Right    ROTATOR CUFF REPAIR  1998   3 times   SHOULDER ARTHROSCOPY  2006   TOTAL ABDOMINAL HYSTERECTOMY  1991   TOTAL HIP ARTHROPLASTY Left 03/01/2016   Procedure: LEFT TOTAL HIP ARTHROPLASTY ANTERIOR APPROACH;  Surgeon: Durene Romans, MD;  Location: [facility];  Service: Orthopedics;  Laterality: Left;  Requests 70 mins   TOTAL HIP ARTHROPLASTY Right 07/27/2021   Procedure: TOTAL HIP ARTHROPLASTY ANTERIOR APPROACH;  Surgeon: Durene Romans, MD;  Location: [facility];  Service: Orthopedics;  Laterality: Right;   trach     Family History  Problem Relation Age of Onset   Stroke Mother        died 50   Aneurysm Mother    Hypertension Mother    Kidney disease Mother    Bipolar disorder Mother    Lung cancer Father        died 70   Bipolar disorder Other    Hypertension Other    Osteoporosis Maternal Grandmother    Arthritis Maternal Grandmother    Alcohol abuse Maternal Grandfather  Outpatient Medications Prior to Visit  Medication Sig Dispense Refill   acetaminophen (TYLENOL) 650 MG CR tablet Take 1,300 mg by mouth in the morning and at bedtime.     celecoxib (CELEBREX) 200 MG capsule Take 200 mg by mouth at bedtime.     chlorthalidone (HYGROTON) 25 MG tablet TAKE 1/2 TABLET BY MOUTH DAILY 45 tablet 0   Cholecalciferol (VITAMIN D3 PO) Take 2,000 Units by mouth daily with supper.     clobetasol ointment (TEMOVATE) 0.05 % SMARTSIG:1 Topical Daily     Clobetasol Prop Emollient Base (CLOBETASOL PROPIONATE E) 0.05 % emollient  cream Apply 1 Application topically at bedtime. 30 g 11   COLLAGEN-VITAMIN C-BIOTIN PO Take 3 capsules by mouth daily with supper.     doxycycline (MONODOX) 50 MG capsule Take 50 mg by mouth daily.     DULoxetine (CYMBALTA) 60 MG capsule Take 60 mg by mouth daily.     estradiol (ESTRACE) 0.1 MG/GM vaginal cream Place 0.5 g vaginally 2 (two) times a week. Place 0.5g nightly for two weeks then twice a week after (Patient taking differently: Place 0.5 g vaginally 2 (two) times a week. Place 0.5g nightly for two weeks then twice a week after; PRN) 30 g 11   famotidine (PEPCID) 10 MG tablet Take 10 mg by mouth at bedtime.     methylphenidate (RITALIN) 10 MG tablet Take 1 tablet (10 mg total) by mouth 2 (two) times daily. 60 tablet 0   minoxidil (LONITEN) 2.5 MG tablet Take by mouth.     Multiple Vitamin (MULTIVITAMIN WITH MINERALS) TABS tablet Take 1 tablet by mouth daily with supper. [facility] [address] Alive! Once Daily Women's 50+     valACYclovir (VALTREX) 1000 MG tablet Take 1,000 mg by mouth 2 (two) times daily.     valsartan (DIOVAN) 320 MG tablet Take 1 tablet (320 mg total) by mouth daily. 90 tablet 3   doxycycline (VIBRAMYCIN) 50 MG capsule Take 1 capsule (50 mg total) by mouth daily. 30 capsule 1   predniSONE (STERAPRED UNI-PAK 21 TAB) 5 MG (21) TBPK tablet Take 5 mg by mouth daily.     No facility-administered medications prior to visit.   Allergies  Allergen Reactions   Penicillins Anaphylaxis    Has patient had a PCN reaction causing immediate rash, facial/tongue/throat swelling, SOB or lightheadedness with hypotension: Yes Has patient had a PCN reaction causing severe rash involving mucus membranes or skin necrosis: No Has patient had a PCN reaction that required hospitalization Yes Has patient had a PCN reaction occurring within the last 10 years: No If all of the above answers are "NO", then may proceed with Cephalosporin use.   Talwin [Pentazocine] Other (See Comments)    Uncontrolled  shaking/tremors   Bextra [Valdecoxib] Hives   Codeine Nausea And Vomiting   Lactose Intolerance (Gi) Diarrhea   Meperidine Hcl Nausea And Vomiting   Morphine Nausea And Vomiting   Other Itching, Swelling, Dermatitis and Rash    [place] Dark Spot Corrector     Objective:   Today's Vitals   10/18/22 1301  BP: 136/78  Pulse: 76  Temp: 98.4 F (36.9 C)  TempSrc: Temporal  SpO2: 96%  Weight: 114 lb 9.6 oz (52 kg)  Height: 5' (1.524 m)   Body mass index is 22.38 kg/m.   General: Well developed, well nourished. No acute distress. HEENT: Normocephalic, non-traumatic. PERRL, EOMI. Conjunctiva clear. External ears normal.   EAC and TMs normal bilaterally. Nose clear without congestion or  rhinorrhea. Mucous membranes   moist. Oropharynx clear. Good dentition. Neuro: CN II-XII intact. No focal neurological deficits. Psych: Alert and oriented. Normal mood and affect.  Health Maintenance Due  Topic Date Due   Medicare Annual Wellness (AWV)  09/16/2022     Assessment & Plan:   Problem List Items Addressed This Visit       Cardiovascular and Mediastinum   Essential hypertension    Blood pressure is up today, but she has been off of her chlorthalidone for about a week. Continue valsartan 320 mg daily.         Relevant Medications   minoxidil (LONITEN) 2.5 MG tablet     Other   Narcolepsy without cataplexy    Discussed Dr. Evlyn Courier plan to titrate up on her methylphenidate to 30 mg daily. I suspect this is the major cause of her fatigue.      Transient neurological symptoms    The etiologies of these episodes are not clear. These may be a variant of her past migraines. Her neurological exam is normal. I will see if any of her blood tests indicate a potential cause.      Other Visit Diagnoses     Other fatigue    -  Primary   I will check lab tests for common causes of fatigue. I suspect her narcolepsy is the most significant contributor.   Relevant Orders   Comprehensive  metabolic panel   CBC   TSH   Vitamin B12   Folate       Return in about 3 months (around 01/18/2023) for Reassessment.   Loyola Mast, MD

## 2022-10-18 NOTE — Assessment & Plan Note (Signed)
The etiologies of these episodes are not clear. These may be a variant of her past migraines. Her neurological exam is normal. I will see if any of her blood tests indicate a potential cause.

## 2022-10-18 NOTE — Telephone Encounter (Signed)
Okay to send and drug prescription for fluoxetine 20 mg capsule 1 daily.  She can start that immediately and immediately reduce duloxetine from 3 of the 30 mg capsules to 2 of the 30 mg capsules for 1 week, then one of the 30 mg capsules for 1 week, then stop it.  Let us know if if that is not effective

## 2022-10-19 ENCOUNTER — Encounter: Payer: Self-pay | Admitting: Family Medicine

## 2022-10-27 ENCOUNTER — Ambulatory Visit (INDEPENDENT_AMBULATORY_CARE_PROVIDER_SITE_OTHER): Payer: Medicare Other | Admitting: Otolaryngology

## 2022-11-01 ENCOUNTER — Ambulatory Visit (INDEPENDENT_AMBULATORY_CARE_PROVIDER_SITE_OTHER): Payer: Medicare Other | Admitting: Psychiatry

## 2022-11-01 ENCOUNTER — Encounter: Payer: Self-pay | Admitting: Psychiatry

## 2022-11-01 DIAGNOSIS — M79604 Pain in right leg: Secondary | ICD-10-CM | POA: Diagnosis not present

## 2022-11-01 DIAGNOSIS — F39 Unspecified mood [affective] disorder: Secondary | ICD-10-CM | POA: Diagnosis not present

## 2022-11-01 DIAGNOSIS — G4710 Hypersomnia, unspecified: Secondary | ICD-10-CM

## 2022-11-01 DIAGNOSIS — G8929 Other chronic pain: Secondary | ICD-10-CM

## 2022-11-01 DIAGNOSIS — F424 Excoriation (skin-picking) disorder: Secondary | ICD-10-CM

## 2022-11-01 DIAGNOSIS — M79605 Pain in left leg: Secondary | ICD-10-CM

## 2022-11-01 MED ORDER — METHYLPHENIDATE HCL 10 MG PO TABS: 10.0000 mg | ORAL_TABLET | Freq: Three times a day (TID) | ORAL | 0 refills | Status: DC

## 2022-11-01 MED ORDER — FLUOXETINE HCL 20 MG PO CAPS
20.0000 mg | ORAL_CAPSULE | Freq: Every day | ORAL | 1 refills | Status: DC
Start: 2022-11-01 — End: 2023-03-06
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
## 2022-11-01 NOTE — Patient Instructions (Signed)
(  NAC) N-Acetylcysteine 2 of the  600 mg capsules twice daily for skin picking

## 2022-11-02 ENCOUNTER — Telehealth: Payer: Self-pay | Admitting: Family Medicine

## 2022-11-02 DIAGNOSIS — G47419 Narcolepsy without cataplexy: Secondary | ICD-10-CM

## 2022-11-02 NOTE — Addendum Note (Signed)
Addended by: Loyola Mast on: 11/02/2022 01:05 PM   Modules accepted: Orders

## 2022-11-02 NOTE — Telephone Encounter (Signed)
Pt stated she has been seeing a Dr Craige Cotta for sleep study. He has transferred to [facility] and for her to continue with him she needs a referral sent to  Fax# [phone] Att Gilberte Nassar [phone]

## 2022-11-23 ENCOUNTER — Other Ambulatory Visit (HOSPITAL_COMMUNITY): Payer: Medicare Other

## 2022-12-06 ENCOUNTER — Ambulatory Visit: Payer: Medicare Other | Admitting: Family Medicine

## 2022-12-31 DIAGNOSIS — M25561 Pain in right knee: Secondary | ICD-10-CM | POA: Diagnosis not present

## 2023-01-19 ENCOUNTER — Ambulatory Visit: Payer: Medicare Other | Admitting: Family Medicine

## 2023-02-01 DIAGNOSIS — G47419 Narcolepsy without cataplexy: Secondary | ICD-10-CM | POA: Diagnosis not present

## 2023-02-03 ENCOUNTER — Other Ambulatory Visit: Payer: Self-pay

## 2023-02-03 MED ORDER — CHLORTHALIDONE 25 MG PO TABS: 12.5000 mg | ORAL_TABLET | Freq: Every day | ORAL | 0 refills | Status: DC

## 2023-02-07 DIAGNOSIS — M25561 Pain in right knee: Secondary | ICD-10-CM | POA: Diagnosis not present

## 2023-03-06 ENCOUNTER — Ambulatory Visit (INDEPENDENT_AMBULATORY_CARE_PROVIDER_SITE_OTHER): Payer: Medicare Other | Admitting: Psychiatry

## 2023-03-06 ENCOUNTER — Encounter: Payer: Self-pay | Admitting: Psychiatry

## 2023-03-06 DIAGNOSIS — M79604 Pain in right leg: Secondary | ICD-10-CM | POA: Diagnosis not present

## 2023-03-06 DIAGNOSIS — F39 Unspecified mood [affective] disorder: Secondary | ICD-10-CM | POA: Diagnosis not present

## 2023-03-06 DIAGNOSIS — M79605 Pain in left leg: Secondary | ICD-10-CM | POA: Diagnosis not present

## 2023-03-06 DIAGNOSIS — F424 Excoriation (skin-picking) disorder: Secondary | ICD-10-CM | POA: Diagnosis not present

## 2023-03-06 DIAGNOSIS — G8929 Other chronic pain: Secondary | ICD-10-CM | POA: Diagnosis not present

## 2023-03-06 DIAGNOSIS — G47419 Narcolepsy without cataplexy: Secondary | ICD-10-CM | POA: Diagnosis not present

## 2023-03-06 MED ORDER — FLUOXETINE HCL 20 MG PO CAPS: 20.0000 mg | ORAL_CAPSULE | Freq: Every day | ORAL | 1 refills | Status: DC
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
## 2023-05-14 NOTE — Progress Notes (Unsigned)
 Cardiology Office Note    Date:  05/15/2023  ID:  [name], Brandy 01/01/1951, MRN [id number] PCP:  Graig Lawyer, MD  Cardiologist:  Euell Herrlich, MD  Electrophysiologist:  None   Chief Complaint: Follow up for CAD   History of Present Illness: .   Brandy [name] is a 73 y.o. female with visit-pertinent history of CABG with LIMA to LAD in 2007, hypertension, hyperlipidemia, idiopathic hypersomnolence, ankle edema with amlodipine .  Patient was last seen in clinic on 11/30/2021 by Dr. Chancy Comber, she had remained stable from a cardiac standpoint.  A preoperative cardiovascular exam was completed in anticipation of a bladder stimulator implant, she was felt to be optimized from a cardiovascular standpoint.  It was recommended that she resume aspirin  81 mg daily.  Today she presents for follow-up.  She reports that she has been doing well overall, she denies any chest pain or increased shortness of breath. She denies palpitations, lower extremity edema, orthopnea or PND.  She denies any presyncope or syncope.  She is regularly exercising and enjoys working out in her garden.  She notes that she has not started on aspirin  81 mg daily and is no longer taking a statin. ROS: .   Today she denies chest pain, shortness of breath, lower extremity edema, fatigue, palpitations, melena, hematuria, hemoptysis, diaphoresis, weakness, presyncope, syncope, orthopnea, and PND.  All other systems are reviewed and otherwise negative. Studies Reviewed: Aaron Aas    EKG:  EKG is ordered today, personally reviewed, demonstrating  EKG Interpretation Date/Time:  Monday May 15 2023 14:00:14 EDT Ventricular Rate:  71 PR Interval:  144 QRS Duration:  76 QT Interval:  400 QTC Calculation: 434 R Axis:   68  Text Interpretation: Normal sinus rhythm Normal ECG Confirmed by Genaro Bekker [phone]) on 05/15/2023 2:33:32 PM   CV Studies: Cardiac studies reviewed are outlined and summarized above. Otherwise please see  EMR for full report. Cardiac Studies & Procedures   ______________________________________________________________________________________________   STRESS TESTS  MYOCARDIAL PERFUSION IMAGING 12/08/2015  Narrative  The left ventricular ejection fraction is hyperdynamic (>65%).  Nuclear stress EF: 69%.  The study is normal.  This is a low risk study.  There was no ST segment deviation noted during stress.  Normal perfusion   ECHOCARDIOGRAM  ECHOCARDIOGRAM COMPLETE 05/01/2019  Narrative ECHOCARDIOGRAM REPORT    Patient Name:   Brandy [name] Date of Exam: 05/01/2019 Medical Rec #:  [id number]          Height:       60.0 in Accession #:    [id number]         Weight:       112.8 lb Date of Birth:  1950-04-26          BSA:          1.463 m Patient Age:    68 years           BP:           172/80 mmHg Patient Gender: F                  HR:           71 bpm. Exam Location:  [address]  Procedure: 2D Echo, 3D Echo, Cardiac Doppler, Color Doppler and Strain Analysis  Indications:    I25.10 Coronary artery disease.  History:        Patient has prior history of Echocardiogram examinations, most recent 01/14/2016. Prior CABG; Risk Factors:Hypertension  and Dyslipidemia. Coronary artery disease. Mitral Valve Prolapse. Murmur.  Sonographer:    Cheri Coria Rodgers-Jones RDCS Referring Phys: [id number] THOMAS A KELLY  IMPRESSIONS   1. Left ventricular ejection fraction, by estimation, is 70 to 75%. The left ventricle has hyperdynamic function. The left ventricle has no regional wall motion abnormalities. Left ventricular diastolic parameters are indeterminate. 2. Right ventricular systolic function is normal. The right ventricular size is normal. There is normal pulmonary artery systolic pressure. 3. The mitral valve is grossly normal. Trivial mitral valve regurgitation. No evidence of mitral stenosis. 4. The aortic valve is tricuspid. Aortic valve regurgitation is not visualized.  Mild aortic valve sclerosis is present, with no evidence of aortic valve stenosis. 5. The inferior vena cava is normal in size with greater than 50% respiratory variability, suggesting right atrial pressure of 3 mmHg.  Comparison(s): No significant change from prior study.  Conclusion(s)/Recommendation(s): Normal biventricular function without evidence of hemodynamically significant valvular heart disease.  FINDINGS Left Ventricle: Left ventricular ejection fraction, by estimation, is 70 to 75%. The left ventricle has hyperdynamic function. The left ventricle has no regional wall motion abnormalities. The left ventricular internal cavity size was normal in size. There is no left ventricular hypertrophy. Left ventricular diastolic parameters are indeterminate.  Right Ventricle: The right ventricular size is normal. No increase in right ventricular wall thickness. Right ventricular systolic function is normal. There is normal pulmonary artery systolic pressure. The tricuspid regurgitant velocity is 2.38 m/s, and with an assumed right atrial pressure of 3 mmHg, the estimated right ventricular systolic pressure is 25.7 mmHg.  Left Atrium: Left atrial size was normal in size.  Right Atrium: Right atrial size was normal in size.  Pericardium: There is no evidence of pericardial effusion.  Mitral Valve: The mitral valve is grossly normal. Moderate mitral annular calcification. Trivial mitral valve regurgitation. No evidence of mitral valve stenosis.  Tricuspid Valve: The tricuspid valve is normal in structure. Tricuspid valve regurgitation is mild . No evidence of tricuspid stenosis.  Aortic Valve: The aortic valve is tricuspid. . There is mild thickening and mild calcification of the aortic valve. Aortic valve regurgitation is not visualized. Mild aortic valve sclerosis is present, with no evidence of aortic valve stenosis. There is mild thickening of the aortic valve. There is mild calcification  of the aortic valve.  Pulmonic Valve: The pulmonic valve was grossly normal. Pulmonic valve regurgitation is not visualized. No evidence of pulmonic stenosis.  Aorta: The aortic root, ascending aorta and aortic arch are all structurally normal, with no evidence of dilitation or obstruction.  Venous: The inferior vena cava is normal in size with greater than 50% respiratory variability, suggesting right atrial pressure of 3 mmHg.  IAS/Shunts: The atrial septum is grossly normal.   LEFT VENTRICLE PLAX 2D LVIDd:         3.50 cm  Diastology LVIDs:         2.10 cm  LV e' lateral:   6.20 cm/s LV PW:         0.90 cm  LV E/e' lateral: 13.5 LV IVS:        0.90 cm  LV e' medial:    5.00 cm/s LVOT diam:     1.80 cm  LV E/e' medial:  16.8 LV SV:         62 LV SV Index:   43       2D Longitudinal Strain LVOT Area:     2.54 cm 2D Strain GLS (A2C):   -  23.0 % 2D Strain GLS (A3C):   -25.4 % 2D Strain GLS (A4C):   -23.7 % 2D Strain GLS Avg:     -24.0 %  3D Volume EF: 3D EF:        76 % LV EDV:       130 ml LV ESV:       31 ml LV SV:        100 ml  RIGHT VENTRICLE RV Basal diam:  3.60 cm RV S prime:     10.60 cm/s TAPSE (M-mode): 1.7 cm  LEFT ATRIUM             Index       RIGHT ATRIUM          Index LA diam:        4.00 cm 2.73 cm/m  RA Area:     9.60 cm LA Vol (A2C):   38.6 ml 26.38 ml/m RA Volume:   20.90 ml 14.28 ml/m LA Vol (A4C):   28.2 ml 19.27 ml/m LA Biplane Vol: 34.8 ml 23.78 ml/m AORTIC VALVE LVOT Vmax:   122.00 cm/s LVOT Vmean:  91.100 cm/s LVOT VTI:    0.246 m  AORTA Ao Root diam: 2.60 cm Ao Asc diam:  3.30 cm  MITRAL VALVE                TRICUSPID VALVE MV Area (PHT): 2.83 cm     TR Peak grad:   22.7 mmHg MV Decel Time: 268 msec     TR Vmax:        238.00 cm/s MV E velocity: 83.90 cm/s MV A velocity: 142.00 cm/s  SHUNTS MV E/A ratio:  0.59         Systemic VTI:  0.25 m Systemic Diam: 1.80 cm  Sheryle Donning MD Electronically signed by Sheryle Donning MD Signature Date/Time: 05/01/2019/6:23:32 PM    Final          ______________________________________________________________________________________________       Current Reported Medications:.    Current Meds  Medication Sig   acetaminophen  (TYLENOL ) 650 MG CR tablet Take 1,300 mg by mouth in the morning and at bedtime.   aspirin  EC 81 MG tablet Take 1 tablet (81 mg total) by mouth daily. Swallow whole.   celecoxib  (CELEBREX ) 200 MG capsule Take 200 mg by mouth at bedtime.   chlorthalidone  (HYGROTON ) 25 MG tablet Take 0.5 tablets (12.5 mg total) by mouth daily.   Cholecalciferol (VITAMIN D3 PO) Take 2,000 Units by mouth daily with supper.   clobetasol  ointment (TEMOVATE ) 0.05 % SMARTSIG:1 Topical Daily   Clobetasol  Prop Emollient Base (CLOBETASOL  PROPIONATE E) 0.05 % emollient cream Apply 1 Application topically at bedtime.   COLLAGEN-VITAMIN C-BIOTIN PO Take 3 capsules by mouth daily with supper.   dextroamphetamine  (DEXTROSTAT ) 5 MG tablet Take by mouth. 3 in am and 1 at 2 PM   doxycycline  (MONODOX ) 50 MG capsule Take 50 mg by mouth daily.   estradiol  (ESTRACE ) 0.1 MG/GM vaginal cream Place 0.5 g vaginally 2 (two) times a week. Place 0.5g nightly for two weeks then twice a week after   famotidine  (PEPCID ) 10 MG tablet Take 10 mg by mouth at bedtime.   FLUoxetine  (PROZAC ) 20 MG capsule Take 1 capsule (20 mg total) by mouth daily.   minoxidil (LONITEN) 2.5 MG tablet Take by mouth.   Multiple Vitamin (MULTIVITAMIN WITH MINERALS) TABS tablet Take 1 tablet by mouth daily with supper. [facility]! Once Daily Women's 50+  valsartan  (DIOVAN ) 320 MG tablet Take 1 tablet (320 mg total) by mouth daily.    Physical Exam:    VS:  BP 122/74 (BP Location: Right Arm, Patient Position: Sitting, Cuff Size: Normal)   Pulse 76   Ht 5' (1.524 m)   Wt 106 lb 9.6 oz (48.4 kg)   LMP 01/10/1989 (Approximate)   SpO2 94%   BMI 20.82 kg/m    Wt Readings from Last 3  Encounters:  05/15/23 106 lb 9.6 oz (48.4 kg)  10/18/22 114 lb 9.6 oz (52 kg)  09/14/22 114 lb 4.8 oz (51.8 kg)    GEN: Well nourished, well developed in no acute distress NECK: No JVD; No carotid bruits CARDIAC: RRR, no murmurs, rubs, gallops RESPIRATORY:  Clear to auscultation without rales, wheezing or rhonchi  ABDOMEN: Soft, non-tender, non-distended EXTREMITIES:  No edema; No acute deformity     Asessement and Plan:.    CAD: s/p CABG with LIMA to LAD in 2007. Today she reports she has been doing very well overall. She denies chest pain, shortness of breath, increased lower extremity edema orthopnea or pnd. Heart healthy diet and regular cardiovascular exercise encouraged.  Encouraged patient to start aspirin  81 mg daily given history of bypass surgery and CAD.  Reviewed ED precautions.  Continue chlorthalidone  12.5 mg daily, minoxidil 2.5 mg daily, valsartan  320 mg daily.  Hypertension: Blood pressure today 122/74. Continue current antihypertensive regimen.  Check c-Met.  Hyperlipidemia: Last lipid profile on 07/09/2021 indicated total cholesterol 150, HDL 6 7, triglycerides 120 and LDL 62.  No recent lipid profile available for review.  LDL goal less than 70.  Patient notes that she is no longer taking a statin medication, patient previously on rosuvastatin .  Check fasting lipid profile and CMET.  If LDL not at goal recommend resuming rosuvastatin .    Disposition: F/u with Dr. Acharya or Treon Kehl, NP in one year.   Signed, Henleigh Robello D Ludwin Flahive, NP

## 2023-05-15 ENCOUNTER — Encounter: Payer: Self-pay | Admitting: Cardiology

## 2023-05-15 ENCOUNTER — Ambulatory Visit: Attending: Cardiology | Admitting: Cardiology

## 2023-05-15 VITALS — BP 122/74 | HR 76 | Ht 60.0 in | Wt 106.6 lb

## 2023-05-15 DIAGNOSIS — I10 Essential (primary) hypertension: Secondary | ICD-10-CM | POA: Diagnosis not present

## 2023-05-15 DIAGNOSIS — I2583 Coronary atherosclerosis due to lipid rich plaque: Secondary | ICD-10-CM | POA: Diagnosis not present

## 2023-05-15 DIAGNOSIS — I2510 Atherosclerotic heart disease of native coronary artery without angina pectoris: Secondary | ICD-10-CM | POA: Diagnosis not present

## 2023-05-15 DIAGNOSIS — E785 Hyperlipidemia, unspecified: Secondary | ICD-10-CM | POA: Diagnosis not present

## 2023-05-15 DIAGNOSIS — Z951 Presence of aortocoronary bypass graft: Secondary | ICD-10-CM

## 2023-05-15 MED ORDER — ASPIRIN 81 MG PO TBEC: 81.0000 mg | DELAYED_RELEASE_TABLET | Freq: Every day | ORAL | Status: AC

## 2023-05-15 NOTE — Patient Instructions (Signed)
 Medication Instructions:  EC Aspirin  81 mg once a day   *If you need a refill on your cardiac medications before your next appointment, please call your pharmacy*  Lab Work: Next week we are going to need to draw a fasting lipid panel and CMet If you have labs (blood work) drawn today and your tests are completely normal, you will receive your results only by: MyChart Message (if you have MyChart) OR A paper copy in the mail If you have any lab test that is abnormal or we need to change your treatment, we will call you to review the results.  Testing/Procedures: No testing  Follow-Up: At [facility], you and your health needs are our priority.  As part of our continuing mission to provide you with exceptional heart care, our providers are all part of one team.  This team includes your primary Cardiologist (physician) and Advanced Practice Providers or APPs (Physician Assistants and Nurse Practitioners) who all work together to provide you with the care you need, when you need it.  Your next appointment:   1 year(s)  Provider:   Euell Herrlich, MD    We recommend signing up for the patient portal called "MyChart".  Sign up information is provided on this After Visit Summary.  MyChart is used to connect with patients for Virtual Visits (Telemedicine).  Patients are able to view lab/test results, encounter notes, upcoming appointments, etc.  Non-urgent messages can be sent to your provider as well.   To learn more about what you can do with MyChart, go to [email or web address].

## 2023-05-16 ENCOUNTER — Other Ambulatory Visit: Payer: Self-pay | Admitting: Internal Medicine

## 2023-05-16 MED ORDER — CHLORTHALIDONE 25 MG PO TABS: 12.5000 mg | ORAL_TABLET | Freq: Every day | ORAL | 3 refills | Status: AC

## 2023-06-01 ENCOUNTER — Encounter: Payer: Self-pay | Admitting: Obstetrics and Gynecology

## 2023-06-01 ENCOUNTER — Ambulatory Visit (INDEPENDENT_AMBULATORY_CARE_PROVIDER_SITE_OTHER): Admitting: Obstetrics and Gynecology

## 2023-06-01 VITALS — BP 112/64 | HR 78

## 2023-06-01 DIAGNOSIS — R159 Full incontinence of feces: Secondary | ICD-10-CM

## 2023-06-01 DIAGNOSIS — N3281 Overactive bladder: Secondary | ICD-10-CM | POA: Diagnosis not present

## 2023-06-01 NOTE — Patient Instructions (Signed)
 Can try benefiber daily for stool bulking if you are unable to tolerate the metamucil.

## 2023-06-01 NOTE — Progress Notes (Signed)
 [facility] Return Visit  SUBJECTIVE  History of Present Illness: Brandy [name] is a 73 y.o. female seen in follow-up for OAB and fecal incontinence. Previously was interested in [employer] but developed a back injury and delayed placement.  Never ended up having back surgery. Still has some pain but no longer has sciatica symptoms.   Continues to have urinary urgency. Has to be really careful when she is out because otherwise will have leakage on the way to the bathroom and will not make it. Avoids caffeine as this makes it worse.   Also has bowel incontinence. Some days occurs several times per day with loose stool. Other days does not have any but that's rare. She had taken metamucil but it did not help that much and it was difficult to drink multiple times per day. She has been trying prunes to help prevent constipation.   She has been seeing Dr Stephane Ee for her lichen sclerosus. Has been using the clobetasol , but not as frequently as she should. Has not been having any irritation.   Past Medical History: Patient  has a past medical history of Arthritis, CAD (coronary artery disease) (2007), COVID (03/04/2021), Depression, Fibromyalgia, GERD (gastroesophageal reflux disease), Hay fever, Headache, Heart murmur, Hyperlipidemia, Hypertension, Idiopathic hypersomnia, Mitral valve prolapse, Narcolepsy, PONV (postoperative nausea and vomiting), and Urinary incontinence.   Past Surgical History: She  has a past surgical history that includes trach; myomectomy (1981); Rotator cuff repair (1998); Coronary artery bypass graft (03/21/2005); Shoulder arthroscopy (2006); Total abdominal hysterectomy (1991); cataract surgery; Total hip arthroplasty (Left, 03/01/2016); reconstructive surgery (1945); Total hip arthroplasty (Right, 07/27/2021); and Replacement total hip w/  resurfacing implants (Right).   Medications: She has a current medication [name] which includes the  following prescription(s): acetaminophen , aspirin  ec, celecoxib , chlorthalidone , cholecalciferol, clobetasol  ointment, clobetasol  prop emollient base, collagen-vitamin c-biotin, dextroamphetamine , doxycycline , estradiol , famotidine , fluoxetine , minoxidil, multivitamin with minerals, valsartan , and valacyclovir.   Allergies: Patient is allergic to penicillins, talwin [pentazocine], bextra [valdecoxib], codeine, lactose intolerance (gi), meperidine hcl, morphine, and other.   Social History: Patient  reports that she has never smoked. She has never used smokeless tobacco. She reports that she does not drink alcohol and does not use drugs.     OBJECTIVE     Physical Exam: Vitals:   06/01/23 0939  BP: 112/64  Pulse: 78   Gen: No apparent distress, A&O x 3.  Detailed Urogynecologic Evaluation:  Deferred.    ASSESSMENT AND PLAN    Brandy [name] is a 73 y.o. with:  1. Overactive bladder   2. Incontinence of feces, unspecified fecal incontinence type     - She is interested in proceeding with [employer] SN[facility] as previously planned. . It requires a test phase, and documentation of bladder function via diary. After a successful test period, a permanent wire and generator are placed in the OR. The battery lasts 5 years on average and would need to be replaced surgically.  The goal of this therapy is at least a 50% improvement in symptoms. It is NOT realistic to expect a 100% cure.  We reviewed the fact that about 30% of patients fail the test phase and are not candidates for permanent generator placement.   - We discussed doing the percutaneous nerve evaluation in the office instead of stage 1 in the OR and she prefers the office procedure.  - She would prefer to have the procedure after the summer, in October. [employer] folder provided with new bladder and bowel diaries  for her to complete prior to the trial for updated baseline symptoms.  - If she is not tolerating the metamucil, advised her to try  benefiber as this can be more palatable and can still help improve her stool consistency.   Arma Lamp, MD

## 2023-06-07 ENCOUNTER — Ambulatory Visit: Payer: Medicare Other | Admitting: Psychiatry

## 2023-06-07 ENCOUNTER — Encounter: Payer: Self-pay | Admitting: Psychiatry

## 2023-06-07 DIAGNOSIS — G47419 Narcolepsy without cataplexy: Secondary | ICD-10-CM | POA: Diagnosis not present

## 2023-06-07 DIAGNOSIS — G8929 Other chronic pain: Secondary | ICD-10-CM

## 2023-06-07 DIAGNOSIS — M79605 Pain in left leg: Secondary | ICD-10-CM | POA: Diagnosis not present

## 2023-06-07 DIAGNOSIS — F39 Unspecified mood [affective] disorder: Secondary | ICD-10-CM | POA: Diagnosis not present

## 2023-06-07 DIAGNOSIS — M79604 Pain in right leg: Secondary | ICD-10-CM

## 2023-06-07 DIAGNOSIS — F424 Excoriation (skin-picking) disorder: Secondary | ICD-10-CM | POA: Diagnosis not present

## 2023-06-07 MED ORDER — FLUOXETINE HCL 20 MG PO CAPS: 20.0000 mg | ORAL_CAPSULE | Freq: Every day | ORAL | 1 refills | Status: DC
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
## 2023-07-04 DIAGNOSIS — L658 Other specified nonscarring hair loss: Secondary | ICD-10-CM | POA: Diagnosis not present

## 2023-07-04 DIAGNOSIS — L900 Lichen sclerosus et atrophicus: Secondary | ICD-10-CM | POA: Diagnosis not present

## 2023-07-17 DIAGNOSIS — M5412 Radiculopathy, cervical region: Secondary | ICD-10-CM | POA: Diagnosis not present

## 2023-07-17 DIAGNOSIS — M00111 Pneumococcal arthritis, right shoulder: Secondary | ICD-10-CM | POA: Diagnosis not present

## 2023-07-25 DIAGNOSIS — M5412 Radiculopathy, cervical region: Secondary | ICD-10-CM | POA: Diagnosis not present

## 2023-08-09 DIAGNOSIS — G47419 Narcolepsy without cataplexy: Secondary | ICD-10-CM | POA: Diagnosis not present

## 2023-08-09 NOTE — Progress Notes (Signed)
 [facility] Pulmonary, Critical Care and Sleep Medicine   Name: Brandy [name] MRN: [id number] DOB: January 27, 1950 Referring provider: Garnette Simpler, MD  Chief Complaint  Patient presents with  . 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   Subjective   She has been doing well recently.  She naps some during the day, and this helps.  She does well as long as she stays active and she exercises on a regular basis.  Not having chest pain, headache, tremor or palpitation.  She takes 15 mg of dextroamphetamine  in the morning and another 5 mg at around 1 pm.  Past Medical History  OA, CAD, Bipolar with depression, Migraine HA, HLD, HTN, MVP, COVID February 2023   Past Surgical History  She  has a past surgical history that includes Hip surgery; Rotator cuff repair; Hysterectomy; Coronary artery bypass graft (2007); Cataract extraction, bilateral (2012); and Tracheostomy (1956).  Social History  She  reports that she has never smoked. She has never used smokeless tobacco. She reports that she does not drink alcohol and does not use drugs.  Family History  Her family history is not on file. Alcohol abuse in her maternal grandfather; Aneurysm in her mother; Arthritis in her maternal grandmother; Bipolar disorder in her mother and another family member; Hypertension in her mother and another family member; Kidney disease in her mother; Lung cancer in her father; Osteoporosis in her maternal grandmother; Stroke in her mother.    Vital Signs  BP 126/65   Pulse 67   Temp 98.3 F (36.8 C) (Oral)   Resp 16   Ht 1.524 m (5')   Wt 48.5 kg (106 lb 14.4 oz)   SpO2 96%   BMI 20.88 kg/m   Physical Exam   General - pleasant ENT - no sinus tenderness, no oral exudate Cardiac - regular rate and rhythm, no murmur Chest - breath sounds equal, no wheeze or rales Ext - no edema, cyanosis, or clubbing Skin - no rashes Psych - normal  mood and behavior  Pulmonary Tests    Sleep Tests  PSG 04/30/07 >> AHI 0.5, PLMI 5.2  MSLT 04/30/07 >> mean sleep latency 4 min, 5/5 naps, 0/5 SOREM PSG 06/11/20 >> AHI 0.7, SpO2 low 85%, PLMI 22.8.     Chest Imaging    Cardiac Tests  Echo 05/01/19 >> EF 70 to 75%     Discussion    Assessment/Plan   Narcolepsy w/o cataplexy. 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 - seen by Dr. Mirna Cohens at voice disorders center in [facility] - followed by speech therapy at [facility]  Depression. - she is followed by Dr. Lorene Macintosh  CAD s/p CABG, HTN, HLD. - followed by [facility] cardiology  Patient Instructions   Patient Instructions  Follow up in 6 months  Allergies and Medications   Allergies  Allergen Reactions  . Penicillins Anaphylaxis    Has patient had a PCN reaction causing immediate rash, facial/tongue/throat swelling, SOB or lightheadedness with hypotension: Yes, Has patient had a PCN reaction causing severe rash involving mucus membranes or skin necrosis: No, Has patient had a PCN reaction that required hospitalization Yes, Has patient had a PCN reaction occurring within the last 10 years: No, If all of  the above answers are NO, then may proceed with Cephalosporin use.  SABRA Pentazocine Itching and Other (See Comments)    Shaking, Muscle spasms  (TALWIN)  . Codeine GI Intolerance  . Lactose GI Intolerance  . Meperidine Hcl GI Intolerance  . Morphine GI Intolerance  . Valdecoxib Other (See Comments)      Medication List       * Accurate as of August 09, 2023 12:22 PM. If you have any questions, ask your nurse or doctor.          CONTINUE taking these medications    aspirin  81 mg EC tablet Take 81 mg by mouth daily.   celecoxib  200 mg capsule Commonly known as: CeleBREX     clobetasoL  0.05 % ointment Commonly known as: TEMOVATE  Apply a pea sized amount to affected areas for lichen sclerosus Monday, Wednesday and Friday   dextroamphetamine  sulfate 5 mg tablet Take 4 tablets (20 mg total) by mouth daily Indications: recurring sleep episodes during the day.   estradioL  0.01 % (0.1 mg/gram) vaginal cream Commonly known as: ESTRACE  Insert 1 gram vaginally twice weekly on Tuesday and Thursday.   famotidine  20 mg tablet Commonly known as: PEPCID    FLUoxetine  20 mg capsule Commonly known as: PROzac  Take 20 mg by mouth daily.   minoxidiL 2.5 mg tablet Commonly known as: LONITEN Take 1 tablet daily in the evening x 3 months supply   TYLENOL  ORAL Take 650 mg by mouth 4 (four) times a day.   valsartan  320 mg tablet Commonly known as: DIOVAN    WOMEN'S 50+ DAILY FORM (GKB) ORAL Take by mouth.        Time Spent  On the day of the visit I spent 23 minutes preparing to see the patient, obtaining and/or reviewing separately obtained history, performing a medically appropriate examination and evaluation, counseling and educating the patient/caregiver, and documenting clinical information in the electronic medical record.This time does not include any time spent performing procedures or assesments that are separately billable.    Signature  Carolynne Allan, MD 08/09/2023, 12:22 PM  [address]   [address]   [address] [place], [place] [address] [place], [place] [address]  [place], [place] [address] [phone]  [phone]  [phone]  *Some images could not be shown.

## 2023-08-11 ENCOUNTER — Other Ambulatory Visit: Payer: Self-pay | Admitting: Obstetrics and Gynecology

## 2023-08-11 ENCOUNTER — Other Ambulatory Visit: Payer: Self-pay | Admitting: Internal Medicine

## 2023-08-11 ENCOUNTER — Telehealth: Payer: Self-pay

## 2023-08-11 DIAGNOSIS — R159 Full incontinence of feces: Secondary | ICD-10-CM

## 2023-08-11 DIAGNOSIS — N3281 Overactive bladder: Secondary | ICD-10-CM

## 2023-08-11 NOTE — Telephone Encounter (Signed)
 S/W pt and scheduled TELE Preop appt 09/15/23.  Med Rec and Consent done

## 2023-08-11 NOTE — Telephone Encounter (Signed)
 Med Rec and Consent done     Patient Consent for Virtual Visit        Brandy [name] has provided verbal consent on 08/11/2023 for a virtual visit (video or telephone).   CONSENT FOR VIRTUAL VISIT FOR:  Brandy [name]  By participating in this virtual visit I agree to the following:  I hereby voluntarily request, consent and authorize [facility] and its employed or contracted physicians, physician assistants, nurse practitioners or other licensed health care professionals (the Practitioner), to provide me with telemedicine health care services (the "Services) as deemed necessary by the treating Practitioner. I acknowledge and consent to receive the Services by the Practitioner via telemedicine. I understand that the telemedicine visit will involve communicating with the Practitioner through live audiovisual communication technology and the disclosure of certain medical information by electronic transmission. I acknowledge that I have been given the opportunity to request an in-person assessment or other available alternative prior to the telemedicine visit and am voluntarily participating in the telemedicine visit.  I understand that I have the right to withhold or withdraw my consent to the use of telemedicine in the course of my care at any time, without affecting my right to future care or treatment, and that the Practitioner or I may terminate the telemedicine visit at any time. I understand that I have the right to inspect all information obtained and/or recorded in the course of the telemedicine visit and may receive copies of available information for a reasonable fee.  I understand that some of the potential risks of receiving the Services via telemedicine include:  Delay or interruption in medical evaluation due to technological equipment failure or disruption; Information transmitted may not be sufficient (e.g. poor resolution of images) to allow for appropriate medical  decision making by the Practitioner; and/or  In rare instances, security protocols could fail, causing a breach of personal health information.  Furthermore, I acknowledge that it is my responsibility to provide information about my medical history, conditions and care that is complete and accurate to the best of my ability. I acknowledge that Practitioner's advice, recommendations, and/or decision may be based on factors not within their control, such as incomplete or inaccurate data provided by me or distortions of diagnostic images or specimens that may result from electronic transmissions. I understand that the practice of medicine is not an exact science and that Practitioner makes no warranties or guarantees regarding treatment outcomes. I acknowledge that a copy of this consent can be made available to me via my patient portal [facility] MyChart), or I can request a printed copy by calling the office of [facility].    I understand that my insurance will be billed for this visit.   I have read or had this consent read to me. I understand the contents of this consent, which adequately explains the benefits and risks of the Services being provided via telemedicine.  I have been provided ample opportunity to ask questions regarding this consent and the Services and have had my questions answered to my satisfaction. I give my informed consent for the services to be provided through the use of telemedicine in my medical care

## 2023-08-11 NOTE — Telephone Encounter (Signed)
..     Pre-operative Risk Assessment    Patient Name: ARDYN FORGE  DOB: Jan 20, 1950 MRN: [id number]   Date of last office visit: 05/15/23 Date of next office visit: NONE   Request for Surgical Clearance    Procedure:  IMPLANTATION OF A SACRAL NERVE STIMULATOR FOR OVERACTIVE BLADDER AND BOWEL LEAKAGE  Date of Surgery:  Clearance TBD                                Surgeon:  ROSALINE CAPER Surgeon's Group or Practice Name:  [facility] Phone number:  [phone] Fax number:  [phone]   Type of Clearance Requested:   - Medical  - Pharmacy:  Hold Aspirin      Type of Anesthesia:  MAC   Additional requests/questions:    Bonney Teressa Rumalda Ronal   08/11/2023, 10:18 AM

## 2023-08-11 NOTE — Telephone Encounter (Signed)
   Name: Brandy [name]  DOB: Apr 26, 1950  MRN: [id number]  Primary Cardiologist: Soyla DELENA Merck, MD  Chart reviewed as part of pre-operative protocol coverage. Because of Valincia Touch Broerman's past medical history and time since last visit, she will require a follow-up telephone visit in order to better assess preoperative cardiovascular risk.  Pre-op covering staff: - Please schedule appointment and call patient to inform them. If patient already had an upcoming appointment within acceptable timeframe, please add pre-op clearance to the appointment notes so provider is aware. - Please contact requesting surgeon's office via preferred method (i.e, phone, fax) to inform them of need for appointment prior to surgery.  As long as she is asymptomatic at the time of phone call, can hold aspirin  x 5 7 days prior to procedure and resume when medically safe to do so.  Orren LOISE Fabry, PA-C  08/11/2023, 10:23 AM

## 2023-08-11 NOTE — Telephone Encounter (Signed)
 Will update surgeons office.

## 2023-08-16 ENCOUNTER — Other Ambulatory Visit: Payer: Self-pay | Admitting: Internal Medicine

## 2023-08-16 MED ORDER — VALSARTAN 320 MG PO TABS: 320.0000 mg | ORAL_TABLET | Freq: Every day | ORAL | 2 refills | Status: AC

## 2023-08-18 ENCOUNTER — Telehealth: Payer: Self-pay | Admitting: Family Medicine

## 2023-08-18 NOTE — Telephone Encounter (Signed)
 Copied from CRM #[id number]. Topic: Appointments - Appointment Scheduling >> Aug 18, 2023  9:30 AM Viola FALCON wrote: Patient returned Lanis call to schedule AWV, scheduled for next available 12/18/23

## 2023-08-30 ENCOUNTER — Encounter: Payer: Self-pay | Admitting: Internal Medicine

## 2023-09-05 DIAGNOSIS — M5412 Radiculopathy, cervical region: Secondary | ICD-10-CM | POA: Diagnosis not present

## 2023-09-07 DIAGNOSIS — Z23 Encounter for immunization: Secondary | ICD-10-CM | POA: Diagnosis not present

## 2023-09-15 ENCOUNTER — Ambulatory Visit: Attending: Internal Medicine | Admitting: Student

## 2023-09-15 DIAGNOSIS — Z01810 Encounter for preprocedural cardiovascular examination: Secondary | ICD-10-CM

## 2023-09-15 NOTE — Progress Notes (Signed)
 Virtual Visit via Telephone Note   Because of Brandy [name]'s co-morbid illnesses, she is at least at moderate risk for complications without adequate follow up.  This format is felt to be most appropriate for this patient at this time.  The patient did not have access to video technology/had technical difficulties with video requiring transitioning to audio format only (telephone).  All issues noted in this document were discussed and addressed.  No physical exam could be performed with this format.  Please refer to the patient's chart for her consent to telehealth for [facility].  Evaluation Performed:  Preoperative cardiovascular risk assessment _____________   Date:  09/15/2023   Patient ID:  Brandy [name], DOB 1950-03-15, MRN [id number] Patient Location:  Home Provider location:   Office  Primary Care Provider:  Thedora Garnette HERO, MD Primary Cardiologist:  Brandy DELENA Merck, MD  Chief Complaint / Patient Profile   73 y.o. y/o female with a h/o CAD s/p CABG x 1 2007, hypertension, hyperlipidemia who is pending implantation of a sacral nerve stimulator for overactive bladder and bowel leakage by Dr. Marilynne and presents today for telephonic preoperative cardiovascular risk assessment.  History of Present Illness    Brandy [name] is a 73 y.o. female who presents via audio/video conferencing for a telehealth visit today.  Pt was last seen in cardiology clinic on 05/15/2023 by Katlyn West, NP.  At that time Brandy [name] was stable from a cardiac standpoint.  The patient is now pending procedure as outlined above. Since her last visit, she is doing well. Patient denies shortness of breath, dyspnea on exertion, lower extremity edema, orthopnea or PND. No chest pain, pressure, or tightness. No palpitations.  She occasionally gets mild lightheadedness if she drops her head down and then stands up too quickly. No dizziness, presyncope or syncope. BP is not getting  low per her checks at home. She is very active. She lives independently. She attends Zumba classes for 1 hour twice a week. She is also attending salsa dance lessons and socials.   Past Medical History    Past Medical History:  Diagnosis Date   Arthritis    CAD (coronary artery disease) 2007   CABG X 1   COVID 03/04/2021   Depression    controlled   Fibromyalgia    GERD (gastroesophageal reflux disease)    Hay fever    Headache    Hx of migraines   Heart murmur    Hyperlipidemia    Hypertension    Idiopathic hypersomnia    Mitral valve prolapse    Narcolepsy    PONV (postoperative nausea and vomiting)    Urinary incontinence    Past Surgical History:  Procedure Laterality Date   cataract surgery     BIL   CORONARY ARTERY BYPASS GRAFT  03/21/2005   off pump LIMA-LAD   myomectomy  1981   reconstructive surgery  1945   lip    REPLACEMENT TOTAL HIP W/  RESURFACING IMPLANTS Right    ROTATOR CUFF REPAIR  1998   3 times   SHOULDER ARTHROSCOPY  2006   TOTAL ABDOMINAL HYSTERECTOMY  1991   TOTAL HIP ARTHROPLASTY Left 03/01/2016   Procedure: LEFT TOTAL HIP ARTHROPLASTY ANTERIOR APPROACH;  Surgeon: Donnice Car, MD;  Location: [facility];  Service: Orthopedics;  Laterality: Left;  Requests 70 mins   TOTAL HIP ARTHROPLASTY Right 07/27/2021   Procedure: TOTAL HIP ARTHROPLASTY ANTERIOR APPROACH;  Surgeon: Car Donnice, MD;  Location:  [facility] ORS;  Service: Orthopedics;  Laterality: Right;   trach      Allergies  Allergies  Allergen Reactions   Penicillins Anaphylaxis    Has patient had a PCN reaction causing immediate rash, facial/tongue/throat swelling, SOB or lightheadedness with hypotension: Yes Has patient had a PCN reaction causing severe rash involving mucus membranes or skin necrosis: No Has patient had a PCN reaction that required hospitalization Yes Has patient had a PCN reaction occurring within the last 10 years: No If all of the above answers are NO, then may proceed  with Cephalosporin use.   Talwin [Pentazocine] Other (See Comments)    Uncontrolled shaking/tremors   Bextra [Valdecoxib] Hives   Codeine Nausea And Vomiting   Lactose Intolerance (Gi) Diarrhea   Meperidine Hcl Nausea And Vomiting   Morphine Nausea And Vomiting   Other Itching, Swelling, Dermatitis and Rash    [place] Dark Spot Corrector    Home Medications    Prior to Admission medications   Medication Sig Start Date End Date Taking? Authorizing Provider  acetaminophen  (TYLENOL ) 650 MG CR tablet Take 1,300 mg by mouth in the morning and at bedtime.    [provider]  aspirin  EC 81 MG tablet Take 1 tablet (81 mg total) by mouth daily. Swallow whole. 05/15/23   West, Katlyn D, NP  celecoxib  (CELEBREX ) 200 MG capsule Take 200 mg by mouth at bedtime.    [provider]  chlorthalidone  (HYGROTON ) 25 MG tablet Take 0.5 tablets (12.5 mg total) by mouth daily. 05/16/23   Acharya, Gayatri A, MD  Cholecalciferol (VITAMIN D3 PO) Take 2,000 Units by mouth daily with supper.    [provider]  clobetasol  ointment (TEMOVATE ) 0.05 % SMARTSIG:1 Topical Daily 10/18/22   [provider]  Clobetasol  Prop Emollient Base (CLOBETASOL  PROPIONATE E) 0.05 % emollient cream Apply 1 Application topically at bedtime. 11/05/21   Brandy Rosaline SAILOR, MD  COLLAGEN-VITAMIN C-BIOTIN PO Take 3 capsules by mouth daily with supper.    [provider]  dextroamphetamine  (DEXTROSTAT ) 5 MG tablet Take by mouth. 3 in am and 1 at 2 PM 02/20/23   [provider]  doxycycline  (MONODOX ) 50 MG capsule Take 50 mg by mouth daily. 10/18/22   [provider]  estradiol  (ESTRACE ) 0.1 MG/GM vaginal cream Place 0.5 g vaginally 2 (two) times a week. Place 0.5g nightly for two weeks then twice a week after 11/08/21   Brandy Rosaline SAILOR, MD  famotidine  (PEPCID ) 10 MG tablet Take 10 mg by mouth at bedtime.    [provider]  FLUoxetine  (PROZAC ) 20 MG capsule Take 1  capsule (20 mg total) by mouth daily. 06/07/23   Cottle, Lorene KANDICE Raddle., MD  minoxidil (LONITEN) 2.5 MG tablet Take by mouth. 10/18/22   [provider]  Multiple Vitamin (MULTIVITAMIN WITH MINERALS) TABS tablet Take 1 tablet by mouth daily with supper. [address] [address] Alive! Once Daily Women's 50+    [provider]  valACYclovir (VALTREX) 1000 MG tablet Take 1,000 mg by mouth 2 (two) times daily. 10/18/22   [provider]  valsartan  (DIOVAN ) 320 MG tablet Take 1 tablet (320 mg total) by mouth daily. 08/16/23   West, Katlyn D, NP    Physical Exam    Vital Signs:  Brandy [name] does not have vital signs available for review today.  Given telephonic nature of communication, physical exam is limited. AAOx3. NAD. Normal affect.  Speech and respirations are unlabored.  Assessment & Plan  Primary Cardiologist: Brandy DELENA Merck, MD  Preoperative cardiovascular risk assessment.  Implantation of a sacral nerve stimulator for overactive bladder and bowel leakage by Dr. Marilynne.  Chart reviewed as part of pre-operative protocol coverage. According to the RCRI, patient has a 0.9% risk of MACE. Patient reports activity equivalent to >4.0 METS (lives independently, zumba for 1 hour twice a week, salsa Secretary/administrator).   Given past medical history and time since last visit, based on ACC/AHA guidelines, Brandy [name] would be at acceptable risk for the planned procedure without further cardiovascular testing.   Patient was advised that if she develops new symptoms prior to surgery to contact our office to arrange a follow-up appointment.  she verbalized understanding.  Ideally aspirin  should be continued without interruption, however if the bleeding risk is too great, aspirin  may be held for 5-7 days prior to surgery. Please resume aspirin  post operatively when it is felt to be safe from a bleeding standpoint.    I will route this recommendation to the requesting  party via Epic fax function.  Please call with questions.  Time:   Today, I have spent 7 minutes with the patient with telehealth technology discussing medical history, symptoms, and management plan.     Barnie Hila, NP  09/15/2023, 7:23 AM

## 2023-09-21 ENCOUNTER — Encounter: Payer: Self-pay | Admitting: Family Medicine

## 2023-09-21 DIAGNOSIS — Z23 Encounter for immunization: Secondary | ICD-10-CM

## 2023-09-21 MED ORDER — COVID-19 MRNA VAC-TRIS(PFIZER) 30 MCG/0.3ML IM SUSY: 0.3000 mL | PREFILLED_SYRINGE | Freq: Once | INTRAMUSCULAR | 0 refills | Status: AC

## 2023-09-22 ENCOUNTER — Other Ambulatory Visit: Payer: Self-pay | Admitting: Obstetrics and Gynecology

## 2023-09-22 DIAGNOSIS — N3281 Overactive bladder: Secondary | ICD-10-CM

## 2023-09-22 DIAGNOSIS — R159 Full incontinence of feces: Secondary | ICD-10-CM

## 2023-09-28 ENCOUNTER — Ambulatory Visit: Admitting: Obstetrics and Gynecology

## 2023-09-28 ENCOUNTER — Encounter: Payer: Self-pay | Admitting: Obstetrics and Gynecology

## 2023-09-28 VITALS — BP 138/57 | HR 76

## 2023-09-28 DIAGNOSIS — R159 Full incontinence of feces: Secondary | ICD-10-CM

## 2023-09-28 DIAGNOSIS — N3281 Overactive bladder: Secondary | ICD-10-CM

## 2023-09-28 NOTE — Patient Instructions (Signed)
 Do NOT remove bandages. If bandages are starting to fall off, you can reinforce with a tegaderm. If that does not work, please notify the office.   Do NOT shower or get your back wet during the trial.   Remember to keep a symptom diary daily.

## 2023-09-28 NOTE — Progress Notes (Signed)
 [facility] Basic Evaluation (PNE) Procedure   Procedure: Sacral Neuromodulator percutaneous sacral stimulation test using bony landmarks. Procedure repeated contralaterally for bilateral test lead placement.    Indication: 73 y.o. F with diagnosis of refractory overactive bladder and fecal incontinence. Informed consent has been obtained.    Description:  Patient was properly identified and placed in the prone position. Patient was positioned to allow the toes to dangle freely. A grounding pad was placed on the bottom of the patient's foot and the cable was connected  to the grounding pad and external stimulation box. The patient was then prepped and draped in the usual sterile fashion. 9cm was measured from the tip of the coccyx superiorly in the midline, then 2cm superiorly and 2cm laterally to mark the foramen bilaterally. Local injection of 27ml 1% lidocaine with epinephrine with 3ml of sodium bicarbonate was injected at the planned entry site bilaterally.    The foramen needle was placed at a 60 degree angle into the left S3 foramen without difficulty. The proper S3 position was confirmed by the patient's sensation to stimulation utilizing the external test stimulator. This was repeated on the right side. The needle stylet was removed and the percutaneous lead was inserted. Using a push and pull technique, the needle was taken out over the lead. This was then repeated on the opposite side.    The patient cables were connected to the leads and grounding pads. The patient was cleaned off and the entire region was covered with tegaderms to secure the leads. The estimated blood loss was negligible. The patient tolerated the procedure well with no complications. Using the external test stimulator, the patient was programmed to the optimum sensation via the lead, and given instructions. Urinary diary will be kept until the return appointment. She was given post-procedure instructions and precautions to call  the office with any concerns.    Return 1 week for follow up and lead removal.    [name], MD

## 2023-10-02 ENCOUNTER — Ambulatory Visit (INDEPENDENT_AMBULATORY_CARE_PROVIDER_SITE_OTHER): Admitting: Obstetrics and Gynecology

## 2023-10-02 VITALS — BP 95/62 | HR 82

## 2023-10-02 DIAGNOSIS — N3281 Overactive bladder: Secondary | ICD-10-CM

## 2023-10-02 DIAGNOSIS — R159 Full incontinence of feces: Secondary | ICD-10-CM

## 2023-10-02 NOTE — Progress Notes (Signed)
 [facility] Return Visit  SUBJECTIVE  History of Present Illness: Brandy [name] is a 73 y.o. female seen in follow-up for PNE. Plan at last visit was PNE trial. Patient had the device placed on 9/18 and had increase support of her bladder leakage. She reports truly significant improvement in her bladder symptoms. Patient reports over the weekend she had night sweats and the device turned itself off due to the significant sweating around the device.    Patient reports >90% improvement in her bladder leakage/urgency/frequency  Patient reports she is unsure how to quantify the improvement for her bowels due to the shortened test phase and the fact that she had no bowel movement the day after the device was place. She reports some improvement but not enough data to give a number. Reports no bowel leakage on 9/19 or 9/20.    Past Medical History: Patient  has a past medical history of Arthritis, CAD (coronary artery disease) (2007), COVID (03/04/2021), Depression, Fibromyalgia, GERD (gastroesophageal reflux disease), Hay fever, Headache, Heart murmur, Hyperlipidemia, Hypertension, Idiopathic hypersomnia, Mitral valve prolapse, Narcolepsy, PONV (postoperative nausea and vomiting), and Urinary incontinence.   Past Surgical History: She  has a past surgical history that includes trach; myomectomy (1981); Rotator cuff repair (1998); Coronary artery bypass graft (03/21/2005); Shoulder arthroscopy (2006); Total abdominal hysterectomy (1991); cataract surgery; Total hip arthroplasty (Left, 03/01/2016); reconstructive surgery (1945); Total hip arthroplasty (Right, 07/27/2021); and Replacement total hip w/  resurfacing implants (Right).   Medications: She has a current medication list which includes the following prescription(s): acetaminophen , aspirin  ec, celecoxib , chlorthalidone , cholecalciferol, clobetasol  ointment, clobetasol  prop emollient base, collagen-vitamin c-biotin,  dextroamphetamine , estradiol , famotidine , fluoxetine , minoxidil, multivitamin with minerals, valacyclovir, and valsartan .   Allergies: Patient is allergic to penicillins, talwin [pentazocine], bextra [valdecoxib], codeine, lactose intolerance (gi), meperidine hcl, morphine, and other.   Social History: Patient  reports that she has never smoked. She has never used smokeless tobacco. She reports that she does not drink alcohol and does not use drugs.     OBJECTIVE     Physical Exam: Vitals:   10/02/23 1312  BP: 95/62  Pulse: 82   Gen: No apparent distress, A&O x 3.  Detailed Urogynecologic Evaluation:  SNM device noted to be hanging loosley with tegederm scrunched around the device. Wires noted to be loose. Device was easily removed with less than 1cm of each since noted to have still been under the skin.   Patient's diary will be scanned into chart.    ASSESSMENT AND PLAN    Brandy [name] is a 74 y.o. with:  1. Overactive bladder   2. Incontinence of feces, unspecified fecal incontinence type    Patient would like to proceed with Axonics stage 2 implantation. Based on her bladder/bowel diary there was significant improvements in her symptomology. Patient had three full trial days and still had symptom improvement noted today despite the device having turned off due to patient's night sweats. Based on diary and patient's reported improvement there is a greater than 50% improvement of her symptoms and enough information to move forward with stage 2 implantation in the OR.   Patient to return for stage 2 implantation.   Teran Daughenbaugh G Ercell Razon, NP

## 2023-10-05 ENCOUNTER — Ambulatory Visit: Admitting: Obstetrics and Gynecology

## 2023-10-11 ENCOUNTER — Other Ambulatory Visit: Payer: Self-pay | Admitting: Obstetrics and Gynecology

## 2023-10-16 ENCOUNTER — Encounter: Payer: Self-pay | Admitting: *Deleted

## 2023-10-16 DIAGNOSIS — Z23 Encounter for immunization: Secondary | ICD-10-CM | POA: Diagnosis not present

## 2023-10-17 ENCOUNTER — Encounter: Payer: Self-pay | Admitting: Family Medicine

## 2023-11-06 ENCOUNTER — Other Ambulatory Visit: Payer: Self-pay | Admitting: Medical Genetics

## 2023-11-06 DIAGNOSIS — Z006 Encounter for examination for normal comparison and control in clinical research program: Secondary | ICD-10-CM

## 2023-11-07 ENCOUNTER — Encounter: Payer: Self-pay | Admitting: Obstetrics and Gynecology

## 2023-11-07 ENCOUNTER — Other Ambulatory Visit: Payer: Self-pay | Admitting: Obstetrics and Gynecology

## 2023-11-07 ENCOUNTER — Ambulatory Visit (INDEPENDENT_AMBULATORY_CARE_PROVIDER_SITE_OTHER): Admitting: Obstetrics and Gynecology

## 2023-11-07 VITALS — BP 117/78 | HR 78 | Wt 106.0 lb

## 2023-11-07 DIAGNOSIS — Z01818 Encounter for other preprocedural examination: Secondary | ICD-10-CM

## 2023-11-07 DIAGNOSIS — N3281 Overactive bladder: Secondary | ICD-10-CM

## 2023-11-07 DIAGNOSIS — R159 Full incontinence of feces: Secondary | ICD-10-CM

## 2023-11-07 MED ORDER — GABAPENTIN 300 MG PO CAPS: 300.0000 mg | ORAL_CAPSULE | ORAL | Status: AC

## 2023-11-07 NOTE — H&P (Signed)
 [facility] H&P  Subjective Chief Complaint: Brandy [name] presents for a preoperative encounter.   History of Present Illness: Brandy [name] is a 73 y.o. female who presents for preoperative visit.  She is scheduled to undergo Insertion of Sacral nerve stimulator on 11/20/23.  Her symptoms include Overactive bladder.    Past Medical History:  Diagnosis Date   Arthritis    CAD (coronary artery disease) 2007   CABG X 1   COVID 03/04/2021   Depression    controlled   Fibromyalgia    GERD (gastroesophageal reflux disease)    Hay fever    Headache    Hx of migraines   Heart murmur    Hyperlipidemia    Hypertension    Idiopathic hypersomnia    Mitral valve prolapse    Narcolepsy    PONV (postoperative nausea and vomiting)    Urinary incontinence      Past Surgical History:  Procedure Laterality Date   cataract surgery     BIL   CORONARY ARTERY BYPASS GRAFT  03/21/2005   off pump LIMA-LAD   myomectomy  1981   reconstructive surgery  1945   lip    REPLACEMENT TOTAL HIP W/  RESURFACING IMPLANTS Right    ROTATOR CUFF REPAIR  1998   3 times   SHOULDER ARTHROSCOPY  2006   TOTAL ABDOMINAL HYSTERECTOMY  1991   TOTAL HIP ARTHROPLASTY Left 03/01/2016   Procedure: LEFT TOTAL HIP ARTHROPLASTY ANTERIOR APPROACH;  Surgeon: Donnice Car, MD;  Location: [facility];  Service: Orthopedics;  Laterality: Left;  Requests 70 mins   TOTAL HIP ARTHROPLASTY Right 07/27/2021   Procedure: TOTAL HIP ARTHROPLASTY ANTERIOR APPROACH;  Surgeon: Car Donnice, MD;  Location: [facility];  Service: Orthopedics;  Laterality: Right;   trach      is allergic to penicillins, talwin [pentazocine], bextra [valdecoxib], codeine, lactose intolerance (gi), meperidine hcl, morphine, and other.   Family History  Problem Relation Age of Onset   Stroke Mother        died 57   Aneurysm Mother    Hypertension Mother    Kidney disease Mother    Bipolar disorder Mother    Lung cancer Father         died 62   Bipolar disorder Other    Hypertension Other    Osteoporosis Maternal Grandmother    Arthritis Maternal Grandmother    Alcohol abuse Maternal Grandfather     Social History   Tobacco Use   Smoking status: Never   Smokeless tobacco: Never  Vaping Use   Vaping status: Never Used  Substance Use Topics   Alcohol use: No   Drug use: No     Review of Systems was negative for a full 10 system review except as noted in the History of Present Illness.  No current facility-administered medications for this encounter.  Current Outpatient Medications:    acetaminophen  (TYLENOL ) 650 MG CR tablet, Take 1,300 mg by mouth in the morning and at bedtime., Disp: , Rfl:    aspirin  EC 81 MG tablet, Take 1 tablet (81 mg total) by mouth daily. Swallow whole., Disp: , Rfl:    celecoxib  (CELEBREX ) 200 MG capsule, Take 200 mg by mouth at bedtime., Disp: , Rfl:    chlorthalidone  (HYGROTON ) 25 MG tablet, Take 0.5 tablets (12.5 mg total) by mouth daily., Disp: 90 tablet, Rfl: 3   Cholecalciferol (VITAMIN D3 PO), Take 2,000 Units by mouth daily with supper., Disp: , Rfl:  clobetasol  ointment (TEMOVATE ) 0.05 %, SMARTSIG:1 Topical Daily, Disp: , Rfl:    Clobetasol  Prop Emollient Base (CLOBETASOL  PROPIONATE E) 0.05 % emollient cream, Apply 1 Application topically at bedtime., Disp: 30 g, Rfl: 11   COLLAGEN-VITAMIN C-BIOTIN PO, Take 3 capsules by mouth daily with supper., Disp: , Rfl:    dextroamphetamine  (DEXTROSTAT ) 5 MG tablet, Take by mouth. 3 in am and 1 at 2 PM, Disp: , Rfl:    estradiol  (ESTRACE ) 0.1 MG/GM vaginal cream, Place 0.5 g vaginally 2 (two) times a week. Place 0.5g nightly for two weeks then twice a week after, Disp: 30 g, Rfl: 11   famotidine  (PEPCID ) 10 MG tablet, Take 10 mg by mouth at bedtime., Disp: , Rfl:    FLUoxetine  (PROZAC ) 20 MG capsule, Take 1 capsule (20 mg total) by mouth daily., Disp: 90 capsule, Rfl: 1   minoxidil (LONITEN) 2.5 MG tablet, Take by mouth., Disp:  , Rfl:    Multiple Vitamin (MULTIVITAMIN WITH MINERALS) TABS tablet, Take 1 tablet by mouth daily with supper. [address] [address] [employer]! Once Daily Women's 50+, Disp: , Rfl:    valACYclovir (VALTREX) 1000 MG tablet, Take 1,000 mg by mouth 2 (two) times daily., Disp: , Rfl:    valsartan  (DIOVAN ) 320 MG tablet, Take 1 tablet (320 mg total) by mouth daily., Disp: 90 tablet, Rfl: 2   Objective There were no vitals filed for this visit.   Gen: NAD CV: S1 S2 RRR Lungs: Clear to auscultation bilaterally Abd: soft, nontender   Previous Pelvic Exam showed: Deferred    Assessment/ Plan  Assessment: The patient is a 73 y.o. year old scheduled to undergo Insertion of Sacral nerve stimulator . Verbal consent was obtained for these procedures.

## 2023-11-07 NOTE — Progress Notes (Signed)
 [facility] Pre-Operative Exam  Subjective Chief Complaint: Brandy [name] presents for a preoperative encounter.   History of Present Illness: Brandy [name] is a 73 y.o. female who presents for preoperative visit.  She is scheduled to undergo Insertion of Sacral nerve stimulator on 11/20/23.  Her symptoms include Overactive bladder.    Past Medical History:  Diagnosis Date   Arthritis    CAD (coronary artery disease) 2007   CABG X 1   COVID 03/04/2021   Depression    controlled   Fibromyalgia    GERD (gastroesophageal reflux disease)    Hay fever    Headache    Hx of migraines   Heart murmur    Hyperlipidemia    Hypertension    Idiopathic hypersomnia    Mitral valve prolapse    Narcolepsy    PONV (postoperative nausea and vomiting)    Urinary incontinence      Past Surgical History:  Procedure Laterality Date   cataract surgery     BIL   CORONARY ARTERY BYPASS GRAFT  03/21/2005   off pump LIMA-LAD   myomectomy  1981   reconstructive surgery  1945   lip    REPLACEMENT TOTAL HIP W/  RESURFACING IMPLANTS Right    ROTATOR CUFF REPAIR  1998   3 times   SHOULDER ARTHROSCOPY  2006   TOTAL ABDOMINAL HYSTERECTOMY  1991   TOTAL HIP ARTHROPLASTY Left 03/01/2016   Procedure: LEFT TOTAL HIP ARTHROPLASTY ANTERIOR APPROACH;  Surgeon: Donnice Car, MD;  Location: [facility];  Service: Orthopedics;  Laterality: Left;  Requests 70 mins   TOTAL HIP ARTHROPLASTY Right 07/27/2021   Procedure: TOTAL HIP ARTHROPLASTY ANTERIOR APPROACH;  Surgeon: Car Donnice, MD;  Location: [facility];  Service: Orthopedics;  Laterality: Right;   trach      is allergic to penicillins, talwin [pentazocine], bextra [valdecoxib], codeine, lactose intolerance (gi), meperidine hcl, morphine, and other.   Family History  Problem Relation Age of Onset   Stroke Mother        died 21   Aneurysm Mother    Hypertension Mother    Kidney disease Mother    Bipolar disorder Mother    Lung  cancer Father        died 16   Bipolar disorder Other    Hypertension Other    Osteoporosis Maternal Grandmother    Arthritis Maternal Grandmother    Alcohol abuse Maternal Grandfather     Social History   Tobacco Use   Smoking status: Never   Smokeless tobacco: Never  Vaping Use   Vaping status: Never Used  Substance Use Topics   Alcohol use: No   Drug use: No     Review of Systems was negative for a full 10 system review except as noted in the History of Present Illness.   Current Outpatient Medications:    acetaminophen  (TYLENOL ) 650 MG CR tablet, Take 1,300 mg by mouth in the morning and at bedtime., Disp: , Rfl:    aspirin  EC 81 MG tablet, Take 1 tablet (81 mg total) by mouth daily. Swallow whole., Disp: , Rfl:    celecoxib  (CELEBREX ) 200 MG capsule, Take 200 mg by mouth at bedtime., Disp: , Rfl:    chlorthalidone  (HYGROTON ) 25 MG tablet, Take 0.5 tablets (12.5 mg total) by mouth daily., Disp: 90 tablet, Rfl: 3   Cholecalciferol (VITAMIN D3 PO), Take 2,000 Units by mouth daily with supper., Disp: , Rfl:    clobetasol  ointment (TEMOVATE ) 0.05 %,  SMARTSIG:1 Topical Daily, Disp: , Rfl:    Clobetasol  Prop Emollient Base (CLOBETASOL  PROPIONATE E) 0.05 % emollient cream, Apply 1 Application topically at bedtime., Disp: 30 g, Rfl: 11   COLLAGEN-VITAMIN C-BIOTIN PO, Take 3 capsules by mouth daily with supper., Disp: , Rfl:    dextroamphetamine  (DEXTROSTAT ) 5 MG tablet, Take by mouth. 3 in am and 1 at 2 PM, Disp: , Rfl:    estradiol  (ESTRACE ) 0.1 MG/GM vaginal cream, Place 0.5 g vaginally 2 (two) times a week. Place 0.5g nightly for two weeks then twice a week after, Disp: 30 g, Rfl: 11   famotidine  (PEPCID ) 10 MG tablet, Take 10 mg by mouth at bedtime., Disp: , Rfl:    FLUoxetine  (PROZAC ) 20 MG capsule, Take 1 capsule (20 mg total) by mouth daily., Disp: 90 capsule, Rfl: 1   minoxidil (LONITEN) 2.5 MG tablet, Take by mouth., Disp: , Rfl:    Multiple Vitamin (MULTIVITAMIN WITH  MINERALS) TABS tablet, Take 1 tablet by mouth daily with supper. [employer] [address] [employer]! Once Daily Women's 50+, Disp: , Rfl:    valACYclovir (VALTREX) 1000 MG tablet, Take 1,000 mg by mouth 2 (two) times daily., Disp: , Rfl:    valsartan  (DIOVAN ) 320 MG tablet, Take 1 tablet (320 mg total) by mouth daily., Disp: 90 tablet, Rfl: 2   Objective Vitals:   11/07/23 1601  BP: 117/78  Pulse: 78    Gen: NAD CV: S1 S2 RRR Lungs: Clear to auscultation bilaterally Abd: soft, nontender   Previous Pelvic Exam showed: Deferred    Assessment/ Plan  Assessment: The patient is a 73 y.o. year old scheduled to undergo Insertion of Sacral nerve stimulator . Verbal consent was obtained for these procedures.  Plan: General Surgical Consent: The patient has previously been counseled on alternative treatments, and the decision by the patient and provider was to proceed with the procedure listed above.  For all procedures, there are risks of bleeding, infection, damage to surrounding organs including but not limited to bowel, bladder, blood vessels, ureters and nerves, and need for further surgery if an injury were to occur. These risks are all low with minimally invasive surgery.   There are risks of numbness and weakness at any body site or buttock/rectal pain.  It is possible that baseline pain can be worsened by surgery, either with or without mesh. If surgery is vaginal, there is also a low risk of possible conversion to laparoscopy or open abdominal incision where indicated. Very rare risks include blood transfusion, blood clot, heart attack, pneumonia, or death.   There is also a risk of short-term postoperative urinary retention with need to use a catheter. About half of patients need to go home from surgery with a catheter, which is then later removed in the office. The risk of long-term need for a catheter is very low. There is also a risk of worsening of overactive bladder.   We discussed consent  for blood products. Risks for blood transfusion include allergic reactions, other reactions that can affect different body organs and managed accordingly, transmission of infectious diseases such as HIV or Hepatitis. However, the blood is screened. Patient consents for blood products.  Pre-operative instructions:  She was instructed to not take Aspirin /NSAIDs x 7days prior to surgery.  Antibiotic prophylaxis was ordered as indicated.  Catheter use: Patient will go home with foley if needed after post-operative voiding trial.  Post-operative instructions:  She was provided with specific post-operative instructions, including precautions and signs/symptoms for which we  would recommend contacting us , in addition to daytime and after-hours contact phone numbers. This was provided on a handout.   Post-operative medications: Prescriptions for motrin, tylenol , miralax , and oxycodone  were sent to her pharmacy. Discussed using ibuprofen and tylenol  on a schedule to limit use of narcotics.   Laboratory testing:  We will check labs: As requested by anesthesia.  Preoperative clearance:  She does require surgical clearance.  Cardiac clearance listed in chart.   Post-operative follow-up:  A post-operative appointment will be made for 6 weeks from the date of surgery. If she needs a post-operative nurse visit for a voiding trial, that will be set up after she leaves the hospital.    Patient will call the clinic or use MyChart should anything change or any new issues arise.   Erskin Zinda G Damarian Priola, NP

## 2023-11-15 ENCOUNTER — Encounter (HOSPITAL_COMMUNITY): Payer: Self-pay | Admitting: Obstetrics and Gynecology

## 2023-11-15 ENCOUNTER — Encounter: Payer: Self-pay | Admitting: Obstetrics and Gynecology

## 2023-11-15 MED ORDER — ACETAMINOPHEN 500 MG PO TABS: 500.0000 mg | ORAL_TABLET | Freq: Four times a day (QID) | ORAL | 0 refills | Status: DC | PRN

## 2023-11-15 MED ORDER — POLYETHYLENE GLYCOL 3350 17 GM/SCOOP PO POWD: 17.0000 g | Freq: Every day | ORAL | 0 refills | Status: AC

## 2023-11-15 MED ORDER — OXYCODONE HCL 5 MG PO TABS: 5.0000 mg | ORAL_TABLET | ORAL | 0 refills | Status: DC | PRN

## 2023-11-15 MED ORDER — IBUPROFEN 600 MG PO TABS: 600.0000 mg | ORAL_TABLET | Freq: Four times a day (QID) | ORAL | 0 refills | Status: DC | PRN

## 2023-11-15 NOTE — Progress Notes (Signed)
 Cardiac clearance in Epic dated 09/15/23 by D. Wittenborn, NP. Pt to continue baby ASA per instructions.      Spoke w/ via phone for pre-op interview--- [facility] needs dos---- BMP per anesthesia.        Lab results------EKG in Epic dated 05/15/23 COVID test -----patient states asymptomatic no test needed Arrive at -------0900 NPO after MN NO Solid Food.  Clear liquids from MN until---0800 Pre-Surgery Ensure or G2:  Med rec completed Medications to take morning of surgery -----NONE Diabetic medication -----  GLP1 agonist last dose: GLP1 instructions:  Patient instructed no nail polish to be worn day of surgery Patient instructed to bring photo id and insurance card day of surgery Patient aware to have Driver (ride ) / caregiver    for 24 hours after surgery - Holley Ernst- friend Patient Special Instructions ----- Pre-Op special Instructions -----  Patient verbalized understanding of instructions that were given at this phone interview. Patient denies chest pain, sob, fever, cough at the interview.

## 2023-11-15 NOTE — Addendum Note (Signed)
 Addended by: Lanier Felty G on: 11/15/2023 08:14 AM   Modules accepted: Orders

## 2023-11-20 ENCOUNTER — Ambulatory Visit (HOSPITAL_COMMUNITY): Admitting: Anesthesiology

## 2023-11-20 ENCOUNTER — Ambulatory Visit (HOSPITAL_COMMUNITY)

## 2023-11-20 ENCOUNTER — Ambulatory Visit (HOSPITAL_COMMUNITY)
Admission: RE | Admit: 2023-11-20 | Discharge: 2023-11-20 | Disposition: A | Discharge status: Home / Self Care | Attending: Obstetrics and Gynecology | Admitting: Obstetrics and Gynecology

## 2023-11-20 ENCOUNTER — Telehealth: Payer: Self-pay | Admitting: Obstetrics and Gynecology

## 2023-11-20 ENCOUNTER — Encounter (HOSPITAL_COMMUNITY)
Admission: RE | Disposition: A | Discharge status: Home / Self Care | Payer: Self-pay | Source: Home / Self Care | Attending: Obstetrics and Gynecology

## 2023-11-20 ENCOUNTER — Encounter (HOSPITAL_COMMUNITY): Payer: Self-pay | Admitting: Obstetrics and Gynecology

## 2023-11-20 DIAGNOSIS — M1990 Unspecified osteoarthritis, unspecified site: Secondary | ICD-10-CM | POA: Insufficient documentation

## 2023-11-20 DIAGNOSIS — F32A Depression, unspecified: Secondary | ICD-10-CM | POA: Insufficient documentation

## 2023-11-20 DIAGNOSIS — Z79899 Other long term (current) drug therapy: Secondary | ICD-10-CM | POA: Insufficient documentation

## 2023-11-20 DIAGNOSIS — N3281 Overactive bladder: Secondary | ICD-10-CM | POA: Insufficient documentation

## 2023-11-20 DIAGNOSIS — R159 Full incontinence of feces: Secondary | ICD-10-CM | POA: Insufficient documentation

## 2023-11-20 DIAGNOSIS — Z951 Presence of aortocoronary bypass graft: Secondary | ICD-10-CM | POA: Insufficient documentation

## 2023-11-20 DIAGNOSIS — Z01818 Encounter for other preprocedural examination: Secondary | ICD-10-CM

## 2023-11-20 DIAGNOSIS — I2510 Atherosclerotic heart disease of native coronary artery without angina pectoris: Secondary | ICD-10-CM | POA: Diagnosis not present

## 2023-11-20 DIAGNOSIS — R519 Headache, unspecified: Secondary | ICD-10-CM | POA: Insufficient documentation

## 2023-11-20 DIAGNOSIS — K219 Gastro-esophageal reflux disease without esophagitis: Secondary | ICD-10-CM | POA: Insufficient documentation

## 2023-11-20 DIAGNOSIS — I10 Essential (primary) hypertension: Secondary | ICD-10-CM | POA: Diagnosis not present

## 2023-11-20 LAB — BASIC METABOLIC PANEL WITH GFR
Anion gap: 12 (ref 5–15)
BUN: 28 mg/dL — ABNORMAL HIGH (ref 8–23)
CO2: 22 mmol/L (ref 22–32)
Calcium: 9.2 mg/dL (ref 8.9–10.3)
Chloride: 107 mmol/L (ref 98–111)
Creatinine, Ser: 0.98 mg/dL (ref 0.44–1.00)
GFR, Estimated: >60 mL/min (ref >60)
Glucose, Bld: 87 mg/dL (ref 70–99)
Potassium: 4.1 mmol/L (ref 3.5–5.1)
Sodium: 141 mmol/L (ref 135–145)

## 2023-11-20 SURGERY — INSERTION, SACRAL NERVE STIMULATOR, INTERSTIM, STAGE 1
Anesthesia: Monitor Anesthesia Care | Site: Back

## 2023-11-20 MED ORDER — PHENYLEPHRINE 80 MCG/ML (10ML) SYRINGE FOR IV PUSH (FOR BLOOD PRESSURE SUPPORT)
PREFILLED_SYRINGE | INTRAVENOUS | Status: DC | PRN
Start: 2023-11-20 — End: 2023-11-20
  Administered 2023-11-20 (×5): 80 ug via INTRAVENOUS
  Administered 2023-11-20: 40 ug via INTRAVENOUS
  Administered 2023-11-20 (×2): 80 ug via INTRAVENOUS

## 2023-11-20 MED ORDER — PROPOFOL 500 MG/50ML IV EMUL
INTRAVENOUS | Status: DC | PRN
  Administered 2023-11-20: 25 ug/kg/min via INTRAVENOUS

## 2023-11-20 MED ORDER — PHENYLEPHRINE 80 MCG/ML (10ML) SYRINGE FOR IV PUSH (FOR BLOOD PRESSURE SUPPORT)
PREFILLED_SYRINGE | INTRAVENOUS | Status: AC
  Filled 2023-11-20: qty 10

## 2023-11-20 MED ORDER — MIDAZOLAM HCL (PF) 2 MG/2ML IJ SOLN
INTRAMUSCULAR | Status: DC | PRN
  Administered 2023-11-20: 1 mg via INTRAVENOUS

## 2023-11-20 MED ORDER — CHLORHEXIDINE GLUCONATE 0.12 % MT SOLN
15.0000 mL | Freq: Once | OROMUCOSAL | Status: AC
  Administered 2023-11-20: 15 mL via OROMUCOSAL

## 2023-11-20 MED ORDER — FENTANYL CITRATE (PF) 100 MCG/2ML IJ SOLN: 25.0000 ug | INTRAMUSCULAR | Status: DC | PRN

## 2023-11-20 MED ORDER — CHLORHEXIDINE GLUCONATE 0.12 % MT SOLN
OROMUCOSAL | Status: AC
  Filled 2023-11-20: qty 15

## 2023-11-20 MED ORDER — LACTATED RINGERS IV SOLN: INTRAVENOUS | Status: DC

## 2023-11-20 MED ORDER — PROPOFOL 10 MG/ML IV BOLUS
INTRAVENOUS | Status: AC
Start: 2023-11-20 — End: 2023-11-20
  Filled 2023-11-20: qty 20

## 2023-11-20 MED ORDER — BUPIVACAINE HCL 0.25 % IJ SOLN
INTRAMUSCULAR | Status: DC | PRN
  Administered 2023-11-20: 45 mL

## 2023-11-20 MED ORDER — DEXMEDETOMIDINE HCL IN NACL 80 MCG/20ML IV SOLN
INTRAVENOUS | Status: DC | PRN
  Administered 2023-11-20 (×4): 4 ug via INTRAVENOUS
  Administered 2023-11-20: 8 ug via INTRAVENOUS

## 2023-11-20 MED ORDER — PROPOFOL 10 MG/ML IV BOLUS
INTRAVENOUS | Status: AC
  Filled 2023-11-20: qty 20

## 2023-11-20 MED ORDER — ACETAMINOPHEN 500 MG PO TABS
1000.0000 mg | ORAL_TABLET | Freq: Once | ORAL | Status: AC
  Administered 2023-11-20: 1000 mg via ORAL

## 2023-11-20 MED ORDER — VANCOMYCIN HCL IN DEXTROSE 1-5 GM/200ML-% IV SOLN
1000.0000 mg | Freq: Once | INTRAVENOUS | Status: AC
  Administered 2023-11-20: 1000 mg via INTRAVENOUS
  Filled 2023-11-20: qty 200

## 2023-11-20 MED ORDER — MIDAZOLAM HCL 2 MG/2ML IJ SOLN
INTRAMUSCULAR | Status: AC
  Filled 2023-11-20: qty 2

## 2023-11-20 MED ORDER — GABAPENTIN 300 MG PO CAPS
300.0000 mg | ORAL_CAPSULE | ORAL | Status: AC
  Administered 2023-11-20: 300 mg via ORAL

## 2023-11-20 MED ORDER — BUPIVACAINE HCL (PF) 0.25 % IJ SOLN
INTRAMUSCULAR | Status: AC
  Filled 2023-11-20: qty 60

## 2023-11-20 MED ORDER — ONDANSETRON HCL 4 MG/2ML IJ SOLN: 4.0000 mg | Freq: Once | INTRAMUSCULAR | Status: DC | PRN

## 2023-11-20 MED ORDER — WATER FOR IRRIGATION, STERILE IR SOLN: Status: DC | PRN

## 2023-11-20 MED ORDER — AMISULPRIDE (ANTIEMETIC) 5 MG/2ML IV SOLN: 10.0000 mg | Freq: Once | INTRAVENOUS | Status: DC | PRN

## 2023-11-20 MED ORDER — 0.9 % SODIUM CHLORIDE (POUR BTL) OPTIME
TOPICAL | Status: DC | PRN
  Administered 2023-11-20: 1000 mL

## 2023-11-20 MED ORDER — GENTAMICIN SULFATE 40 MG/ML IJ SOLN
5.0000 mg/kg | Freq: Once | INTRAMUSCULAR | Status: AC
  Administered 2023-11-20: 230 mg via INTRAVENOUS
  Filled 2023-11-20: qty 5.75

## 2023-11-20 MED ORDER — FENTANYL CITRATE (PF) 250 MCG/5ML IJ SOLN
INTRAMUSCULAR | Status: AC
  Filled 2023-11-20: qty 5

## 2023-11-20 MED ORDER — GABAPENTIN 300 MG PO CAPS
ORAL_CAPSULE | ORAL | Status: AC
  Filled 2023-11-20: qty 1

## 2023-11-20 MED ORDER — ORAL CARE MOUTH RINSE
15.0000 mL | Freq: Once | OROMUCOSAL | Status: AC
Start: 2023-11-20 — End: 2023-11-20

## 2023-11-20 MED ORDER — ACETAMINOPHEN 500 MG PO TABS
ORAL_TABLET | ORAL | Status: DC
Start: 2023-11-20 — End: 2023-11-20
  Filled 2023-11-20: qty 2

## 2023-11-20 SURGICAL SUPPLY — 46 items
BLADE SURG 11 STRL SS (BLADE) ×2 IMPLANT
BLADE SURG 15 STRL LF DISP TIS (BLADE) ×2 IMPLANT
CABLE EXTEN 4.32 INTERSTIM (NEUROSURGERY SUPPLIES) IMPLANT
CABLE NEUROSTIMULATOR GROUND (NEUROSURGERY SUPPLIES) IMPLANT
CHLORAPREP W/TINT 26 (MISCELLANEOUS) ×2 IMPLANT
COVER BACK TABLE 60X90IN (DRAPES) ×2 IMPLANT
COVER MAYO STAND STRL (DRAPES) ×2 IMPLANT
COVER PROBE W GEL 5X96 (DRAPES) IMPLANT
DERMABOND ADVANCED .7 DNX12 (GAUZE/BANDAGES/DRESSINGS) IMPLANT
DRAPE C-ARM 42X120 X-RAY (DRAPES) ×2 IMPLANT
DRAPE LAPAROSCOPIC ABDOMINAL (DRAPES) ×2 IMPLANT
DRAPE SHEET LG 3/4 BI-LAMINATE (DRAPES) ×2 IMPLANT
DRAPE UTILITY XL STRL (DRAPES) ×2 IMPLANT
DRSG TEGADERM 4X4.75 (GAUZE/BANDAGES/DRESSINGS) IMPLANT
DRSG TEGADERM 8X12 (GAUZE/BANDAGES/DRESSINGS) IMPLANT
DRSG TELFA 3X8 NADH STRL (GAUZE/BANDAGES/DRESSINGS) IMPLANT
ELECTRODE REM PT RTRN 9FT ADLT (ELECTROSURGICAL) ×2 IMPLANT
EXTENSION NEURO PERCUTANEOUS (NEUROSURGERY SUPPLIES) IMPLANT
GAUZE 4X4 16PLY ~~LOC~~+RFID DBL (SPONGE) ×2 IMPLANT
GLOVE BIOGEL PI IND STRL 6.5 (GLOVE) ×2 IMPLANT
GLOVE ECLIPSE 6.0 STRL STRAW (GLOVE) ×2 IMPLANT
GOWN STRL REUS W/ TWL LRG LVL3 (GOWN DISPOSABLE) ×2 IMPLANT
KIT BASIN OR (CUSTOM PROCEDURE TRAY) ×2 IMPLANT
KIT HANDSET INTERSTIM COMM (NEUROSURGERY SUPPLIES) IMPLANT
KIT LEAD TINED W/2 NDL GUIDE (NEUROSURGERY SUPPLIES) IMPLANT
KIT LEAD TINED W/STYLET (Lead) IMPLANT
NDL FORAMN 5 20GA (NEUROSURGERY SUPPLIES) IMPLANT
NDL HYPO 22X1.5 SAFETY MO (MISCELLANEOUS) ×2 IMPLANT
NEEDLE FORAMN 5 20GA (NEUROSURGERY SUPPLIES) IMPLANT
NEEDLE HYPO 22X1.5 SAFETY MO (MISCELLANEOUS) ×2 IMPLANT
NEUROSTIM NON-RECHARGE (Neurostimulator) IMPLANT
NEUROSTIMULATOR 1.7X2X.06 (UROLOGICAL SUPPLIES) IMPLANT
PENCIL SMOKE EVACUATOR (MISCELLANEOUS) ×2 IMPLANT
REMOTE CONTROL NEURO PATIENT (NEUROSURGERY SUPPLIES) IMPLANT
SLEEVE SCD COMPRESS KNEE MED (STOCKING) ×2 IMPLANT
STIMULATOR EXTERNAL TRIAL (NEUROSURGERY SUPPLIES) IMPLANT
SUT MNCRL AB 4-0 PS2 18 (SUTURE) IMPLANT
SUT SILK 2 0 SH (SUTURE) IMPLANT
SUT VIC AB 2-0 SH 27XBRD (SUTURE) IMPLANT
SYR BULB IRRIG 60ML STRL (SYRINGE) ×2 IMPLANT
SYR CONTROL 10ML LL (SYRINGE) ×2 IMPLANT
TOWEL GREEN STERILE FF (TOWEL DISPOSABLE) ×2 IMPLANT
TUBE CONNECTING 12X1/4 (SUCTIONS) IMPLANT
UNDERPAD 30X36 HEAVY ABSORB (UNDERPADS AND DIAPERS) ×2 IMPLANT
WATER STERILE IRR 500ML POUR (IV SOLUTION) ×2 IMPLANT
YANKAUER SUCT BULB TIP NO VENT (SUCTIONS) ×2 IMPLANT

## 2023-11-20 NOTE — Anesthesia Procedure Notes (Signed)
 Procedure Name: MAC Date/Time: 11/20/2023 12:03 PM  Performed by: Jolynn Mage, CRNAPre-anesthesia Checklist: Patient identified, Emergency Drugs available, Suction available, Timeout performed and Patient being monitored Patient Re-evaluated:Patient Re-evaluated prior to induction Oxygen Delivery Method: Nasal Cannula

## 2023-11-20 NOTE — Anesthesia Preprocedure Evaluation (Addendum)
 Anesthesia Evaluation  Patient identified by MRN, date of birth, ID band Patient awake    Reviewed: Allergy & Precautions, NPO status , Patient's Chart, lab work & pertinent test results  History of Anesthesia Complications (+) PONV and history of anesthetic complications  Airway Mallampati: II  TM Distance: >3 FB Neck ROM: Full    Dental no notable dental hx.    Pulmonary neg pulmonary ROS   Pulmonary exam normal        Cardiovascular hypertension, Pt. on medications + CAD and + CABG  Normal cardiovascular exam     Neuro/Psych  Headaches PSYCHIATRIC DISORDERS  Depression     Neuromuscular disease    GI/Hepatic ,GERD  Medicated and Controlled,,  Endo/Other  negative endocrine ROS    Renal/GU negative Renal ROS     Musculoskeletal  (+) Arthritis ,    Abdominal   Peds  Hematology negative hematology ROS (+)   Anesthesia Other Findings overactive bladder fecal incontinence  Reproductive/Obstetrics                              Anesthesia Physical Anesthesia Plan  ASA: 3  Anesthesia Plan: MAC   Post-op Pain Management:    Induction:   PONV Risk Score and Plan: 3 and Ondansetron , Dexamethasone , Midazolam  and Treatment may vary due to age or medical condition  Airway Management Planned: Nasal Cannula  Additional Equipment:   Intra-op Plan:   Post-operative Plan:   Informed Consent: I have reviewed the patients History and Physical, chart, labs and discussed the procedure including the risks, benefits and alternatives for the proposed anesthesia with the patient or authorized representative who has indicated his/her understanding and acceptance.     Dental advisory given  Plan Discussed with: CRNA  Anesthesia Plan Comments:         Anesthesia Quick Evaluation

## 2023-11-20 NOTE — Transfer of Care (Signed)
 Immediate Anesthesia Transfer of Care Note  Patient: Brandy [name]  Procedure(s) Performed: INSERTION, SACRAL NERVE STIMULATOR, INTERSTIM, STAGE 1 (Back) INSERTION, SACRAL NERVE STIMULATOR, INTERSTIM, STAGE 2 (Back)  Patient Location: PACU  Anesthesia Type:MAC  Level of Consciousness: awake, alert , oriented, patient cooperative, and responds to stimulation  Airway & Oxygen Therapy: Patient Spontanous Breathing and Patient connected to nasal cannula oxygen  Post-op Assessment: Report given to RN, Post -op Vital signs reviewed and stable, and Patient moving all extremities X 4  Post vital signs: Reviewed and stable  Last Vitals:  Vitals Value Taken Time  BP 98/41 11/20/23 13:46  Temp    Pulse 56 11/20/23 13:50  Resp 17 11/20/23 13:50  SpO2 100 % 11/20/23 13:50  Vitals shown include unfiled device data.  Last Pain:  Vitals:   11/20/23 0904  TempSrc: Oral  PainSc: 4          Complications: No notable events documented.

## 2023-11-20 NOTE — Telephone Encounter (Signed)
 Brandy [name] underwent Axonics SNM placement on 11/20/23.   Please call her for a routine post op check. Thanks!  [name], MD

## 2023-11-20 NOTE — Interval H&P Note (Signed)
 History and Physical Interval Note:  11/20/2023 11:15 AM  Brandy [name]  has presented today for surgery, with the diagnosis of overactive bladder and fecal incontinence.  The various methods of treatment have been discussed with the patient and family. After consideration of risks, benefits and other options for treatment, the patient has consented to  Procedure(s) with comments: INSERTION, SACRAL NERVE STIMULATOR, INTERSTIM, STAGE 1 (N/A) - Stage 1 & stage 2 together total time requested 75 minutes INSERTION, SACRAL NERVE STIMULATOR, INTERSTIM, STAGE 2 (N/A) with fluoroscopy as a surgical intervention.  The patient's history has been reviewed, patient examined, no change in status, stable for surgery.  I have reviewed the patient's chart and labs.  Questions were answered to the patient's satisfaction.     [name]

## 2023-11-20 NOTE — Op Note (Signed)
 Operative Note  Preoperative Diagnosis: overactive bladder  Postoperative Diagnosis: same  Procedures performed:  Placement of [employer] sacral nerve stimulator lead (right) with implantable pulse generator (left) with fluoroscopic guidance and interpretation  Implants:  Implant Name Type Inv. Item Serial No. Manufacturer Lot No. LRB No. Used Action  NEUROSTIM NON-RECHARGE - [id number] Neurostimulator NEUROSTIM NON-RECHARGE [id number] [employer]  Left 1 Implanted  KIT LEAD TINED W/STYLET - [id number] Lead KIT LEAD TINED W/STYLET [id number] [employer]  Left 1 Implanted    Attending Surgeon: Rosaline Caper, MD  Assistant: Jorene Moats, PA  Anesthesia: MAC  Findings: Plantar flexion and bellows with stimulation of S3 on the right  Specimens: none  Estimated blood loss: 5 mL  IV fluids: 700 mL  Urine output: none  Complications: none  Procedure in Detail:  After informed consent was obtained, the patient was taken to the operating room where anesthesia was induced and found to be adequate. Patient was placed in the prone position with adequate padding to flatten the sacrum and under the shins to allow the toes to dangle freely. She was then prepped and draped in the usual sterile fashion. The C-arm was positioned in the AP and lateral positions to provide fluoroscopic mapping of the sacral region. Spinal needles in laid in parallel were used to identify the foramen landmarks.The S3 foramen was identified. Local injection of 0.25% bupivacaine  was injected down each side to the level of the sacrum. The foramen needle was placed at a 60 degree angle into the S3 foramen on the left. Proper needle depth was confirmed with fluoroscopy. This was repeated on the right side.  Position was confirmed by direct observation of bellowing and plantar flexion of the toe utilizing the external test stimulator. Optimal response was noted on the right side. No response was obtained with S3 or S4 on  the left with several attempts at placement, so the left side needle was removed.   The foramen needle stylet was removed and guidewire was placed. An incision was made with an 11-blade scalpel and the introducer was placed over the guidewire. Proper depth was confirmed with fluoroscopy until the opaque mark of the dilator was seen on the anterior rim of the sacrum with fluoroscopy.  The obturator was unlocked and removed and the lead was placed through the introducer sheath to the first white line.  Position was checked fluoroscopically.  The lead was then introduced until three electrodes were visible below the sacrum.  Each electrode was tested for visualization of the bellows and plantar flexion of the great toe.  After satisfactory positioning was confirmed under fluoroscopy, the introducer sheath was retracted deploying the lead ties into the perisacral tissues under live fluoroscopy.    Attention was turned to the patient's left where anesthetic was injected at a point previously marked on the buttocks, below the iliac crest and below the belt line. The skin was injected with 0.25% bupivacaine  and an incision was made with a 15 blade scalpel. Further dissection was made into the subcutaneous tissue, allowing for a sufficient pocket for the generator.  A tunneling tool and tube were placed from the sacral lead subcutaneously to the incised pocket site.  The tunneling tool was removed and the lead was sent through the tube and pulled out at the pocket site. The pocket was noted to be hemostatic. The generator was connected and placed in the pocket. It was noted to have adequate room but also stayed in place. The incision was inspected and  noted to be hemostatic.  The subcutaneous layer was closed with running 2-0 Vicryl. The skin was closed with a subcuticular stitch of 4-0 monocryl. The incision from the neuroelectrode was closed with interrupted 2-0 vicryl.  Dermabond was placed over the incisions. The  patient tolerated the procedure well and was taken to the recovery room in stable condition. Needle and sponge count was correct x2.    [name], MD

## 2023-11-20 NOTE — Discharge Instructions (Addendum)
 POST OPERATIVE SACRAL NEUROMODULATION INSTRUCTIONS  General Instructions Recovery (not bed rest) will last approximately 1 week Walking is encouraged, but refrain from strenuous exercise/ housework/ heavy lifting. Bathing:  Do not submerge in water  (NO swimming, bath, hot tub, etc) until after your postop visit.  You may shower the day after surgery No driving until you are not taking narcotic pain medicine and until your pain is well enough controlled that you can slam on the breaks or make sudden movements if needed.   Taking your medications Please take your acetaminophen  and ibuprofen on a schedule for the first 48 hours. Take 600mg  ibuprofen, then take 500mg  acetaminophen  3 hours later, then continue to alternate ibuprofen and acetaminophen . That way you are taking each type of medication every 6 hours. Take the prescribed narcotic (oxycodone , tramadol, etc) as needed, with a maximum being every 4 hours.  Antibiotics will be prescribed during the test period (after stage 1).   Reasons to Call the Nurse (see last page for phone numbers) Persistent nausea/vomiting Fever (100.4 degrees or more) Incision problems (pus, blood or other fluid coming out, redness, warmth, increased pain)  Things to Expect After Surgery Mild to Moderate pain is normal during the first day or two after surgery. If prescribed, take Ibuprofen or Tylenol  first and use the stronger medicine for "break-through" pain. You can overlap these medicines because they work differently.   Constipation   To Prevent Constipation:  Eat a well-balanced diet including protein, grains, fresh fruit and vegetables.  Drink plenty of fluids. Walk regularly.  Depending on specific instructions from your physician: take Miralax  daily and additionally you can add a stool softener (colace/ docusate) and fiber supplement. Continue as long as you're on pain medications.   To Treat Constipation:  If you do not have a bowel movement in 2 days  after surgery, you can take 2 Tbs of Milk of Magnesia 1-2 times a day until you have a bowel movement. If diarrhea occurs, decrease the amount or stop the laxative. If no results with Milk of Magnesia, you can drink a bottle of magnesium  citrate which you can purchase over the counter.  Fatigue:  This is a normal response to surgery and will improve with time.  Plan frequent rest periods throughout the day.   Return to Work  As work demands and recovery times vary widely, it is hard to predict when you will want to return to work. If you have a desk job with no strenuous physical activity, and if you would like to return sooner than generally recommended, discuss this with your provider or call our office.   Post op concerns  For non-emergent issues, please call the Urogynecology Nurse. Please leave a message and someone will contact you within one business day.  You can also send a message through MyChart.   AFTER HOURS (After 5:00 PM and on weekends):  For urgent matters that cannot wait until the next business day. Call our office [phone] and connect to the doctor on call.  Please reserve this for important issues.   **FOR ANY TRUE EMERGENCY ISSUES CALL 911 OR GO TO THE NEAREST EMERGENCY ROOM.** Please inform our office or the doctor on call of any emergency.     APPOINTMENTS: Call [phone]   Post Anesthesia Home Care Instructions  Activity: Get plenty of rest for the remainder of the day. A responsible individual must stay with you for 24 hours following the procedure.  For the next 24 hours, DO NOT: -  Drive a car -Advertising copywriter -Drink alcoholic beverages -Take any medication unless instructed by your physician -Make any legal decisions or sign important papers.  Meals: Start with liquid foods such as gelatin or soup. Progress to regular foods as tolerated. Avoid greasy, spicy, heavy foods. If nausea and/or vomiting occur, drink only clear liquids until the nausea  and/or vomiting subsides. Call your physician if vomiting continues.  Special Instructions/Symptoms: Your throat may feel dry or sore from the anesthesia or the breathing tube placed in your throat during surgery. If this causes discomfort, gargle with warm salt water . The discomfort should disappear within 24 hours.

## 2023-11-21 ENCOUNTER — Encounter (HOSPITAL_COMMUNITY): Payer: Self-pay | Admitting: Obstetrics and Gynecology

## 2023-11-21 NOTE — Anesthesia Postprocedure Evaluation (Signed)
 Anesthesia Post Note  Patient: Brandy [name]  Procedure(s) Performed: INSERTION, SACRAL NERVE STIMULATOR, INTERSTIM, STAGE 1 (Back) INSERTION, SACRAL NERVE STIMULATOR, INTERSTIM, STAGE 2 (Back)     Patient location during evaluation: PACU Anesthesia Type: MAC Level of consciousness: awake Pain management: pain level controlled Vital Signs Assessment: post-procedure vital signs reviewed and stable Respiratory status: spontaneous breathing, nonlabored ventilation and respiratory function stable Cardiovascular status: blood pressure returned to baseline and stable Postop Assessment: no apparent nausea or vomiting Anesthetic complications: no   No notable events documented.  Last Vitals:  Vitals:   11/20/23 1420 11/20/23 1430  BP: 95/61 (!) 91/52  Pulse:  (!) 53  Resp:  13  Temp:  36.7 C  SpO2:  98%    Last Pain:  Vitals:   11/20/23 1430  TempSrc:   PainSc: 0-No pain                 Dhana Totton P Jayden Rudge

## 2023-11-23 NOTE — Telephone Encounter (Signed)
 LMK for pt to call back for the post op call.  [place] CMA

## 2023-11-27 ENCOUNTER — Ambulatory Visit: Admitting: Psychiatry

## 2023-11-27 ENCOUNTER — Encounter: Payer: Self-pay | Admitting: Psychiatry

## 2023-11-27 DIAGNOSIS — M79604 Pain in right leg: Secondary | ICD-10-CM | POA: Diagnosis not present

## 2023-11-27 DIAGNOSIS — G8929 Other chronic pain: Secondary | ICD-10-CM

## 2023-11-27 DIAGNOSIS — F424 Excoriation (skin-picking) disorder: Secondary | ICD-10-CM

## 2023-11-27 DIAGNOSIS — G47419 Narcolepsy without cataplexy: Secondary | ICD-10-CM | POA: Diagnosis not present

## 2023-11-27 DIAGNOSIS — M79605 Pain in left leg: Secondary | ICD-10-CM | POA: Diagnosis not present

## 2023-11-27 DIAGNOSIS — F39 Unspecified mood [affective] disorder: Secondary | ICD-10-CM | POA: Diagnosis not present

## 2023-11-27 DIAGNOSIS — G4710 Hypersomnia, unspecified: Secondary | ICD-10-CM

## 2023-11-27 MED ORDER — FLUOXETINE HCL 20 MG PO CAPS: 20.0000 mg | ORAL_CAPSULE | Freq: Every day | ORAL | 3 refills | Status: AC
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
## 2023-11-30 NOTE — Telephone Encounter (Signed)
 Pt scheduled. KD CMA

## 2023-12-15 ENCOUNTER — Ambulatory Visit: Admitting: Obstetrics and Gynecology

## 2023-12-15 ENCOUNTER — Encounter: Payer: Self-pay | Admitting: Obstetrics and Gynecology

## 2023-12-15 VITALS — BP 128/63 | HR 85

## 2023-12-15 DIAGNOSIS — Z48816 Encounter for surgical aftercare following surgery on the genitourinary system: Secondary | ICD-10-CM

## 2023-12-15 DIAGNOSIS — K5904 Chronic idiopathic constipation: Secondary | ICD-10-CM

## 2023-12-15 DIAGNOSIS — N3281 Overactive bladder: Secondary | ICD-10-CM

## 2023-12-15 NOTE — Progress Notes (Signed)
 [facility]  Date of Visit: 12/15/2023  History of Present Illness: [name] is a 73 y.o. female scheduled today for a post-operative visit.   Surgery: s/p Placement of [employer] sacral nerve stimulator lead (right) with implantable pulse generator (left) with fluoroscopic guidance and interpretation on 11/20/23  Overall she feels she is 90% improved. It controls her urgency well. She only has occasional drops of urine, esp in the morning. She did increase the stimulation with the rep. She drank a starbucks latte and had a lot of leakage. She did not bring her remote today.   Still has issues with bowel leakage due to her constipation. Can sometimes go a few days without a BM.Has been using occasional dulcolax. She also needed to use a saline enema. She did run out of the metamucil gummies.   Medications: She has a current medication list which includes the following prescription(s): acetaminophen , aspirin  ec, celecoxib , chlorthalidone , cholecalciferol, clobetasol  ointment, clobetasol  prop emollient base, collagen-vitamin c-biotin, dextroamphetamine , estradiol , famotidine , fluoxetine , minoxidil, multivitamin with minerals, and valsartan , and the following Facility-Administered Medications: gabapentin .   Allergies: Patient is allergic to penicillins, talwin [pentazocine], bextra [valdecoxib], codeine, lactose intolerance (gi), meperidine hcl, morphine, and other.   Physical Exam: BP 128/63   Pulse 85   LMP 01/10/1989 (Approximate)    Left buttock Incisions: well healed, small scab present, suture over right sacrum removed.   ---------------------------------------------------------  Assessment and Plan:  1. Overactive bladder   2. Chronic idiopathic constipation     - Good improvement overall with her OAB symptoms. We discussed she may increase her stimulation more but because she does not have her remote today, I do not know her current stimulation. We discussed that  higher stimulation does not always mean improvement in symptoms, but can try.  - For constipation, she will also follow up with her GI. I recommended daily miralax  and metamucil.   Return 6 months or sooner if needed  Brandy LOISE Caper, MD

## 2023-12-15 NOTE — Patient Instructions (Addendum)
 Take miralax  and metamucil daily for constipation. Can titrate dose of miralax  to less if needed. Can also take miralax  twice daily if needed.   Ok to return to all regular activity including exercise, baths/ swimming.

## 2023-12-18 ENCOUNTER — Ambulatory Visit

## 2024-01-21 ENCOUNTER — Encounter: Payer: Self-pay | Admitting: Family Medicine

## 2024-05-16 ENCOUNTER — Ambulatory Visit: Admitting: Internal Medicine

## 2024-08-26 ENCOUNTER — Ambulatory Visit: Admitting: Psychiatry
